# Patient Record
Sex: Male | Born: 1960 | Race: White | Hispanic: No | Marital: Married | Smoking: Current every day smoker
Health system: Southern US, Community
[De-identification: ages and names within clinical notes are randomized; demographics above are authoritative.]

## PROBLEM LIST (undated history)

## (undated) DIAGNOSIS — E119 Type 2 diabetes mellitus without complications: Secondary | ICD-10-CM

## (undated) DIAGNOSIS — F32A Depression, unspecified: Secondary | ICD-10-CM

## (undated) DIAGNOSIS — F17200 Nicotine dependence, unspecified, uncomplicated: Secondary | ICD-10-CM

## (undated) DIAGNOSIS — Z803 Family history of malignant neoplasm of breast: Secondary | ICD-10-CM

## (undated) DIAGNOSIS — Z808 Family history of malignant neoplasm of other organs or systems: Secondary | ICD-10-CM

## (undated) DIAGNOSIS — I10 Essential (primary) hypertension: Secondary | ICD-10-CM

## (undated) DIAGNOSIS — E785 Hyperlipidemia, unspecified: Secondary | ICD-10-CM

## (undated) DIAGNOSIS — N529 Male erectile dysfunction, unspecified: Secondary | ICD-10-CM

## (undated) DIAGNOSIS — F329 Major depressive disorder, single episode, unspecified: Secondary | ICD-10-CM

## (undated) DIAGNOSIS — C801 Malignant (primary) neoplasm, unspecified: Secondary | ICD-10-CM

## (undated) HISTORY — DX: Family history of malignant neoplasm of breast: Z80.3

## (undated) HISTORY — DX: Family history of malignant neoplasm of other organs or systems: Z80.8

---

## 2003-12-18 ENCOUNTER — Encounter: Admission: RE | Admit: 2003-12-18 | Discharge: 2004-03-17 | Payer: Self-pay | Admitting: *Deleted

## 2006-10-12 ENCOUNTER — Emergency Department (HOSPITAL_COMMUNITY): Admission: EM | Admit: 2006-10-12 | Discharge: 2006-10-12 | Payer: Self-pay | Admitting: Emergency Medicine

## 2016-10-08 ENCOUNTER — Other Ambulatory Visit (HOSPITAL_BASED_OUTPATIENT_CLINIC_OR_DEPARTMENT_OTHER): Payer: Self-pay | Admitting: Family Medicine

## 2016-10-08 ENCOUNTER — Ambulatory Visit (HOSPITAL_BASED_OUTPATIENT_CLINIC_OR_DEPARTMENT_OTHER)
Admission: RE | Admit: 2016-10-08 | Discharge: 2016-10-08 | Disposition: A | Discharge status: Home / Self Care | Payer: Managed Care, Other (non HMO) | Source: Ambulatory Visit | Attending: Family Medicine | Admitting: Family Medicine

## 2016-10-08 DIAGNOSIS — M79605 Pain in left leg: Secondary | ICD-10-CM | POA: Diagnosis present

## 2016-10-08 DIAGNOSIS — I82492 Acute embolism and thrombosis of other specified deep vein of left lower extremity: Secondary | ICD-10-CM | POA: Diagnosis not present

## 2016-10-08 DIAGNOSIS — M7989 Other specified soft tissue disorders: Secondary | ICD-10-CM | POA: Insufficient documentation

## 2016-10-08 DIAGNOSIS — I82432 Acute embolism and thrombosis of left popliteal vein: Secondary | ICD-10-CM | POA: Diagnosis not present

## 2016-10-08 DIAGNOSIS — I82412 Acute embolism and thrombosis of left femoral vein: Secondary | ICD-10-CM | POA: Diagnosis not present

## 2017-04-29 ENCOUNTER — Other Ambulatory Visit (HOSPITAL_COMMUNITY): Payer: Self-pay | Admitting: Family Medicine

## 2017-04-29 DIAGNOSIS — Z09 Encounter for follow-up examination after completed treatment for conditions other than malignant neoplasm: Secondary | ICD-10-CM

## 2017-05-07 ENCOUNTER — Ambulatory Visit (HOSPITAL_COMMUNITY)
Admission: RE | Admit: 2017-05-07 | Discharge: 2017-05-07 | Disposition: A | Discharge status: Home / Self Care | Payer: Managed Care, Other (non HMO) | Source: Ambulatory Visit | Attending: Family Medicine | Admitting: Family Medicine

## 2017-05-07 DIAGNOSIS — I82412 Acute embolism and thrombosis of left femoral vein: Secondary | ICD-10-CM | POA: Diagnosis present

## 2017-05-07 DIAGNOSIS — Z09 Encounter for follow-up examination after completed treatment for conditions other than malignant neoplasm: Secondary | ICD-10-CM | POA: Diagnosis not present

## 2017-05-07 NOTE — Progress Notes (Signed)
*  PRELIMINARY RESULTS* Vascular Ultrasound Left lower extremity venous duplex has been completed.  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  Limited visualization of the calf veins but no acute thrombosis noted.  Preliminary results called to Dr. Baldomero Lamy office at 10:15.  Myrtie Cruise Lafaye Mcelmurry 05/07/2017, 10:16 AM

## 2017-10-15 ENCOUNTER — Ambulatory Visit (HOSPITAL_BASED_OUTPATIENT_CLINIC_OR_DEPARTMENT_OTHER)
Admission: RE | Admit: 2017-10-15 | Discharge: 2017-10-15 | Disposition: A | Discharge status: Home / Self Care | Payer: Managed Care, Other (non HMO) | Source: Ambulatory Visit | Attending: Family Medicine | Admitting: Family Medicine

## 2017-10-15 ENCOUNTER — Encounter (HOSPITAL_COMMUNITY): Payer: Self-pay | Admitting: *Deleted

## 2017-10-15 ENCOUNTER — Inpatient Hospital Stay (HOSPITAL_COMMUNITY)
Admission: EM | Admit: 2017-10-15 | Discharge: 2017-10-20 | DRG: 272 | Disposition: A | Discharge status: Home / Self Care | Payer: Managed Care, Other (non HMO) | Attending: Internal Medicine | Admitting: Internal Medicine

## 2017-10-15 ENCOUNTER — Other Ambulatory Visit (HOSPITAL_BASED_OUTPATIENT_CLINIC_OR_DEPARTMENT_OTHER): Payer: Self-pay | Admitting: Family Medicine

## 2017-10-15 ENCOUNTER — Other Ambulatory Visit: Payer: Self-pay

## 2017-10-15 DIAGNOSIS — E1122 Type 2 diabetes mellitus with diabetic chronic kidney disease: Secondary | ICD-10-CM | POA: Diagnosis present

## 2017-10-15 DIAGNOSIS — I82402 Acute embolism and thrombosis of unspecified deep veins of left lower extremity: Secondary | ICD-10-CM

## 2017-10-15 DIAGNOSIS — D696 Thrombocytopenia, unspecified: Secondary | ICD-10-CM

## 2017-10-15 DIAGNOSIS — I10 Essential (primary) hypertension: Secondary | ICD-10-CM | POA: Diagnosis not present

## 2017-10-15 DIAGNOSIS — N183 Chronic kidney disease, stage 3 unspecified: Secondary | ICD-10-CM | POA: Diagnosis present

## 2017-10-15 DIAGNOSIS — I82412 Acute embolism and thrombosis of left femoral vein: Principal | ICD-10-CM | POA: Diagnosis present

## 2017-10-15 DIAGNOSIS — M7989 Other specified soft tissue disorders: Secondary | ICD-10-CM | POA: Insufficient documentation

## 2017-10-15 DIAGNOSIS — R609 Edema, unspecified: Secondary | ICD-10-CM

## 2017-10-15 DIAGNOSIS — Z7984 Long term (current) use of oral hypoglycemic drugs: Secondary | ICD-10-CM

## 2017-10-15 DIAGNOSIS — D649 Anemia, unspecified: Secondary | ICD-10-CM | POA: Diagnosis present

## 2017-10-15 DIAGNOSIS — F329 Major depressive disorder, single episode, unspecified: Secondary | ICD-10-CM | POA: Diagnosis present

## 2017-10-15 DIAGNOSIS — I129 Hypertensive chronic kidney disease with stage 1 through stage 4 chronic kidney disease, or unspecified chronic kidney disease: Secondary | ICD-10-CM | POA: Diagnosis present

## 2017-10-15 DIAGNOSIS — I8002 Phlebitis and thrombophlebitis of superficial vessels of left lower extremity: Secondary | ICD-10-CM | POA: Insufficient documentation

## 2017-10-15 DIAGNOSIS — N289 Disorder of kidney and ureter, unspecified: Secondary | ICD-10-CM

## 2017-10-15 DIAGNOSIS — E119 Type 2 diabetes mellitus without complications: Secondary | ICD-10-CM

## 2017-10-15 DIAGNOSIS — E785 Hyperlipidemia, unspecified: Secondary | ICD-10-CM | POA: Diagnosis present

## 2017-10-15 DIAGNOSIS — F17210 Nicotine dependence, cigarettes, uncomplicated: Secondary | ICD-10-CM | POA: Diagnosis present

## 2017-10-15 HISTORY — DX: Depression, unspecified: F32.A

## 2017-10-15 HISTORY — DX: Nicotine dependence, unspecified, uncomplicated: F17.200

## 2017-10-15 HISTORY — DX: Male erectile dysfunction, unspecified: N52.9

## 2017-10-15 HISTORY — DX: Major depressive disorder, single episode, unspecified: F32.9

## 2017-10-15 HISTORY — DX: Type 2 diabetes mellitus without complications: E11.9

## 2017-10-15 HISTORY — DX: Hyperlipidemia, unspecified: E78.5

## 2017-10-15 LAB — BASIC METABOLIC PANEL
Anion gap: 10 (ref 5–15)
BUN: 18 mg/dL (ref 6–20)
CO2: 22 mmol/L (ref 22–32)
Calcium: 9.8 mg/dL (ref 8.9–10.3)
Chloride: 100 mmol/L — ABNORMAL LOW (ref 101–111)
Creatinine, Ser: 1.51 mg/dL — ABNORMAL HIGH (ref 0.61–1.24)
GFR calc Af Amer: 57 mL/min — ABNORMAL LOW (ref >60)
GFR calc non Af Amer: 50 mL/min — ABNORMAL LOW (ref >60)
Glucose, Bld: 127 mg/dL — ABNORMAL HIGH (ref 65–99)
Potassium: 4.8 mmol/L (ref 3.5–5.1)
Sodium: 132 mmol/L — ABNORMAL LOW (ref 135–145)

## 2017-10-15 LAB — CBC WITH DIFFERENTIAL/PLATELET
Basophils Absolute: 0 10*3/uL (ref 0.0–0.1)
Basophils Relative: 0 %
Eosinophils Absolute: 0.4 10*3/uL (ref 0.0–0.7)
Eosinophils Relative: 4 %
HCT: 40.9 % (ref 39.0–52.0)
Hemoglobin: 14.1 g/dL (ref 13.0–17.0)
Lymphocytes Relative: 24 %
Lymphs Abs: 2.2 10*3/uL (ref 0.7–4.0)
MCH: 30.7 pg (ref 26.0–34.0)
MCHC: 34.5 g/dL (ref 30.0–36.0)
MCV: 88.9 fL (ref 78.0–100.0)
Monocytes Absolute: 0.8 10*3/uL (ref 0.1–1.0)
Monocytes Relative: 9 %
Neutro Abs: 5.7 10*3/uL (ref 1.7–7.7)
Neutrophils Relative %: 63 %
Platelets: 187 10*3/uL (ref 150–400)
RBC: 4.6 MIL/uL (ref 4.22–5.81)
RDW: 13.3 % (ref 11.5–15.5)
WBC: 9.1 10*3/uL (ref 4.0–10.5)

## 2017-10-15 MED ORDER — HEPARIN (PORCINE) IN NACL 100-0.45 UNIT/ML-% IJ SOLN
1700.0000 [IU]/h | INTRAMUSCULAR | Status: DC
  Administered 2017-10-15 – 2017-10-18 (×5): 1700 [IU]/h via INTRAVENOUS
  Filled 2017-10-15 (×8): qty 250

## 2017-10-15 MED ORDER — ACETAMINOPHEN 650 MG RE SUPP: 650.0000 mg | Freq: Four times a day (QID) | RECTAL | Status: DC | PRN

## 2017-10-15 MED ORDER — ACETAMINOPHEN 325 MG PO TABS
650.0000 mg | ORAL_TABLET | Freq: Four times a day (QID) | ORAL | Status: DC | PRN
  Administered 2017-10-18 – 2017-10-19 (×2): 650 mg via ORAL
  Filled 2017-10-15 (×2): qty 2

## 2017-10-15 MED ORDER — ONDANSETRON HCL 4 MG/2ML IJ SOLN: 4.0000 mg | Freq: Four times a day (QID) | INTRAMUSCULAR | Status: DC | PRN

## 2017-10-15 MED ORDER — GLIMEPIRIDE 4 MG PO TABS
4.0000 mg | ORAL_TABLET | Freq: Every day | ORAL | Status: DC
  Administered 2017-10-16 – 2017-10-20 (×4): 4 mg via ORAL
  Filled 2017-10-15 (×6): qty 1

## 2017-10-15 MED ORDER — INSULIN ASPART 100 UNIT/ML ~~LOC~~ SOLN: 0.0000 [IU] | Freq: Three times a day (TID) | SUBCUTANEOUS | Status: DC

## 2017-10-15 MED ORDER — FENOFIBRATE 54 MG PO TABS
54.0000 mg | ORAL_TABLET | Freq: Every day | ORAL | Status: DC
  Administered 2017-10-16: 54 mg via ORAL
  Filled 2017-10-15: qty 1

## 2017-10-15 MED ORDER — PIOGLITAZONE HCL 45 MG PO TABS
45.0000 mg | ORAL_TABLET | Freq: Every day | ORAL | Status: DC
  Administered 2017-10-16: 45 mg via ORAL
  Filled 2017-10-15: qty 1

## 2017-10-15 MED ORDER — METFORMIN HCL 500 MG PO TABS
1000.0000 mg | ORAL_TABLET | Freq: Two times a day (BID) | ORAL | Status: DC
  Administered 2017-10-16: 1000 mg via ORAL
  Filled 2017-10-15: qty 2

## 2017-10-15 MED ORDER — ONDANSETRON HCL 4 MG PO TABS: 4.0000 mg | ORAL_TABLET | Freq: Four times a day (QID) | ORAL | Status: DC | PRN

## 2017-10-15 MED ORDER — HEPARIN BOLUS VIA INFUSION
6000.0000 [IU] | Freq: Once | INTRAVENOUS | Status: AC
  Administered 2017-10-15: 6000 [IU] via INTRAVENOUS
  Filled 2017-10-15: qty 6000

## 2017-10-15 MED ORDER — LISINOPRIL 10 MG PO TABS
10.0000 mg | ORAL_TABLET | Freq: Every day | ORAL | Status: DC
  Administered 2017-10-16: 10 mg via ORAL
  Filled 2017-10-15: qty 1

## 2017-10-15 NOTE — Consult Note (Signed)
[facility] [place]  Patient name: Brian Torres MRN: [id number] DOB: 12-04-1960 Sex: male  REASON FOR CONSULT: Left leg DVT  HPI: Brian Torres is a 56 y.o. male, who is admitted for left leg DVT.  He has a prior history of DVT approximately 1 year ago.  He was treated with 7 months of Xarelto and then discontinued.  He presents today with a new episode of significant left leg swelling in his entire leg.  He reports that prior to today he would have some swelling in his knee distally with prolonged standing but this is dramatically different.  He underwent outpatient duplex showing occlusion of his right common femoral, femoral and popliteal veins.  He is admitted for IV antibiotics and planned lysis.  He does not have any history of hypercoagulability.  He denies of clotting disorders.  He does not recall any trauma around the time of his first DVT or this event.  Past Medical History:  Diagnosis Date  . Depression   . Diabetes mellitus without complication ([place])   . Dyslipidemia   . Erectile dysfunction   . Nicotine addiction     No family history on file.  SOCIAL HISTORY: Social History   Socioeconomic History  . Marital status: Married    Spouse name: Not on file  . Number of children: Not on file  . Years of education: Not on file  . Highest education level: Not on file  Social Needs  . Financial resource strain: Not on file  . Food insecurity - worry: Not on file  . Food insecurity - inability: Not on file  . Transportation needs - medical: Not on file  . Transportation needs - non-medical: Not on file  Occupational History  . Not on file  Tobacco Use  . Smoking status: Current Every Day Smoker    Packs/day: 0.50    Types: Cigarettes  . Smokeless tobacco: Never Used  Substance and Sexual Activity  . Alcohol use: No    Frequency: Never  . Drug use: No  . Sexual activity: Not on file  Other Topics  Concern  . Not on file  Social History Narrative  . Not on file    No Known Allergies  Current Facility-Administered Medications  Medication Dose Route Frequency Provider Last Rate Last Dose  . acetaminophen (TYLENOL) tablet 650 mg  650 mg Oral Q6H PRN Etta Quill, DO       Or  . acetaminophen (TYLENOL) suppository 650 mg  650 mg Rectal Q6H PRN Etta Quill, DO      . [START ON 10/16/2017] fenofibrate tablet 54 mg  54 mg Oral Daily Jennette Kettle M, DO      . [START ON 10/16/2017] glimepiride (AMARYL) tablet 4 mg  4 mg Oral Q breakfast Jennette Kettle M, DO      . heparin ADULT infusion 100 units/mL (25000 units/239mL sodium chloride 0.45%)  1,700 Units/hr Intravenous Continuous Reginia Naas, RPH 17 mL/hr at 10/15/17 1932 1,700 Units/hr at 10/15/17 1932  . [START ON 10/16/2017] lisinopril (PRINIVIL,ZESTRIL) tablet 10 mg  10 mg Oral Daily Etta Quill, DO      . [START ON 10/16/2017] metFORMIN (GLUCOPHAGE) tablet 1,000 mg  1,000 mg Oral BID WC Etta Quill, DO      . ondansetron [facility]) tablet 4 mg  4 mg Oral Q6H PRN Etta Quill, DO       Or  . ondansetron [facility]) injection  4 mg  4 mg Intravenous Q6H PRN Etta Quill, DO      . [START ON 10/16/2017] pioglitazone (ACTOS) tablet 45 mg  45 mg Oral Daily Etta Quill, DO        REVIEW OF SYSTEMS:  Reviewed in his history and physical with nothing to add PHYSICAL EXAM: Vitals:   10/15/17 1930 10/15/17 1945 10/15/17 2030 10/15/17 2045  BP: 114/74 116/81 103/77 114/71  Pulse: 84 94 95 96  Resp: (!) 24 14 (!) 26   Temp:      TempSrc:      SpO2: 100% 100% 97% 98%  Weight:      Height:        GENERAL: The patient is a well-nourished male, in no acute distress. The vital signs are documented above. CARDIOVASCULAR: 2+ dorsalis pedis pulses bilaterally.  Marked swelling and some erythema throughout his entire left leg with no swelling on the right PULMONARY: There is good air exchange  ABDOMEN: Soft  and non-tender  MUSCULOSKELETAL: There are no major deformities or cyanosis. NEUROLOGIC: No focal weakness or paresthesias are detected. SKIN: There are no ulcers or rashes noted.  But he does have changes consistent with venous hypertension in his left leg with telangiectasia around his distal calf and extending onto his foot pSYCHIATRIC: The patient has a normal affect.  DATA:  Left leg femoral and distal DVT  MEDICAL ISSUES: Discussed at length with the patient and his wife and also Dr. Alcario Drought.  Due to the significance of his swelling and his recurrent thrombosis in the proximal nature of the disease venography and possible lysis.  The patient is being admitted for IV heparin and will plan procedure on Monday, 10/18/2017.  He does have mild renal insufficiency   Rosetta Posner, MD FACS [facility] Tel [phone] Pager [phone]

## 2017-10-15 NOTE — Progress Notes (Signed)
Patient arrived to unit from ED. Alert and oriented x4. Wife at bedside. Report received from [place], [place].

## 2017-10-15 NOTE — Progress Notes (Signed)
ANTICOAGULATION CONSULT NOTE - Initial Consult  Pharmacy Consult for heparin Indication: DVT  No Known Allergies  Patient Measurements: TBW 102 kg IBW 86.8 kg Heparin Dosing Weight: 102 kg  Assessment: 55 yo M presents on 12/14 from [place] due to confirmed DVT in L leg. Pharmacy consulted to start heparin. No anticoag PTA.   Goal of Therapy:  Heparin level 0.3-0.7 units/ml Monitor platelets by anticoagulation protocol: Yes   Plan:  Give heparin 6,000 unit bolus Start heparin gtt at 1,700 units/hr Monitor daily heparin level, CBC, s/s of bleed  Elenor Quinones, PharmD, [facility] Clinical Pharmacist Pager [phone] 10/15/2017 6:43 PM

## 2017-10-15 NOTE — ED Provider Notes (Signed)
[facility] EMERGENCY DEPARTMENT Provider Note   CSN: [id number] Arrival date & time: 10/15/17  1711     History   Chief Complaint Chief Complaint  Patient presents with  . Leg Pain    HPI Brian Torres is a 56 y.o. male.  Patient noticed swelling of the left leg, today, so went to his PCP.  He was ultimately diagnosed with a left leg DVT, large.  Vascular surgery, Dr. Donzetta Matters, was contacted who suggested that he come to the ED, be started on heparin, and admitted.  The patient denies fever, chills, shortness of breath, chest pain, weakness or dizziness.  Last week he had a cough and cold symptoms with a low-grade temperature.  He continues to have some postnasal drip and occasional cough.  He has a history of DVT about 1 year ago which was treated with warfarin for 6 months.  He states he has chronic swelling of the left lower leg and ankle, since the DVT.  There are no other known modifying factors.  HPI  Past Medical History:  Diagnosis Date  . Depression   . Diabetes mellitus without complication ([place])   . Dyslipidemia   . Erectile dysfunction   . Nicotine addiction     There are no active problems to display for this patient.   History reviewed. No pertinent surgical history.     Home Medications    Prior to Admission medications   Medication Sig Start Date End Date Taking? Authorizing Provider  b complex vitamins tablet Take 1 tablet by mouth daily.   Yes [provider]  CINNAMON PO Take 1,000 mg by mouth 2 (two) times daily.   Yes [provider]  fenofibrate (TRICOR) 48 MG tablet Take 48 mg by mouth daily. 10/13/17  Yes [provider]  glimepiride (AMARYL) 4 MG tablet Take 4 mg by mouth daily. 10/13/17  Yes [provider]  Javier Docker Oil 350 MG CAPS Take 350 mg by mouth 2 (two) times daily.   Yes [provider]  lisinopril (PRINIVIL,ZESTRIL) 10 MG tablet Take 10 mg by mouth daily. 10/13/17  Yes  [provider]  metFORMIN (GLUCOPHAGE) 1000 MG tablet Take 1,000 mg by mouth 2 (two) times daily. 10/13/17  Yes [provider]  pioglitazone (ACTOS) 45 MG tablet Take 45 mg by mouth daily. 10/13/17  Yes [provider]    Family History No family history on file.  Social History Social History   Tobacco Use  . Smoking status: Current Every Day Smoker    Packs/day: 0.50    Types: Cigarettes  . Smokeless tobacco: Never Used  Substance Use Topics  . Alcohol use: No    Frequency: Never  . Drug use: No     Allergies   Patient has no known allergies.   Review of Systems Review of Systems  All other systems reviewed and are negative.    Physical Exam Updated Vital Signs BP 114/74   Pulse 84   Temp 97.8 F (36.6 C) (Oral)   Resp (!) 24   Ht 6\' 4"  (1.93 m)   Wt 102.1 kg (225 lb)   SpO2 100%   BMI 27.39 kg/m   Physical Exam  Constitutional: He is oriented to person, place, and time. He appears well-developed and well-nourished. No distress.  HENT:  Head: Normocephalic and atraumatic.  Right Ear: External ear normal.  Left Ear: External ear normal.  Eyes: Conjunctivae and EOM are normal. Pupils are equal, round,  and reactive to light.  Neck: Normal range of motion and phonation normal. Neck supple.  Cardiovascular: Normal rate, regular rhythm and normal heart sounds.  Pulmonary/Chest: Effort normal and breath sounds normal. He exhibits no bony tenderness.  Abdominal: Soft. He exhibits no distension. There is no tenderness.  Musculoskeletal: Normal range of motion.  Left leg swollen from the groin to the toes.  Left leg is nontender to palpation.  No overlying skin changes.  Normal range of motion of arms and legs bilaterally.  Neurological: He is alert and oriented to person, place, and time. No cranial nerve deficit or sensory deficit. He exhibits normal muscle tone. Coordination normal.  Skin: Skin is warm, dry and intact.  Psychiatric:  He has a normal mood and affect. His behavior is normal. Judgment and thought content normal.  Nursing note and vitals reviewed.    ED Treatments / Results  Labs (all labs ordered are listed, but only abnormal results are displayed) Labs Reviewed  BASIC METABOLIC PANEL - Abnormal; Notable for the following components:      Result Value   Sodium 132 (*)    Chloride 100 (*)    Glucose, Bld 127 (*)    Creatinine, Ser 1.51 (*)    GFR calc non Af Amer 50 (*)    GFR calc Af Amer 57 (*)    All other components within normal limits  CBC WITH DIFFERENTIAL/PLATELET  CBC  HEPARIN LEVEL (UNFRACTIONATED)    \ BUN  Date Value Ref Range Status  10/15/2017 18 6 - 20 mg/dL Final   Creatinine, Ser  Date Value Ref Range Status  10/15/2017 1.51 (H) 0.61 - 1.24 mg/dL Final     EKG  EKG Interpretation None       Radiology US Venous Img Lower Unilateral Left  Result Date: 10/15/2017 CLINICAL DATA:  Left lower extremity swelling EXAM: LEFT LOWER EXTREMITY VENOUS DOPPLER ULTRASOUND TECHNIQUE: Gray-scale sonography with graded compression, as well as color Doppler and duplex ultrasound were performed to evaluate the lower extremity deep venous systems from the level of the common femoral vein and including the common femoral, femoral, profunda femoral, popliteal and calf veins including the posterior tibial, peroneal and gastrocnemius veins when visible. The superficial great saphenous vein was also interrogated. Spectral Doppler was utilized to evaluate flow at rest and with distal augmentation maneuvers in the common femoral, femoral and popliteal veins. COMPARISON:  None. FINDINGS: Contralateral Common Femoral Vein: Respiratory phasicity is normal and symmetric with the symptomatic side. No evidence of thrombus. Normal compressibility. Common Femoral Vein: Occlusive acute thrombus noted within the common femoral vein with expansion of the vein. Saphenofemoral Junction: Occlusive thrombus noted  in the saphenous femoral junction. Profunda Femoral Vein: Occlusive, acute thrombus noted. Femoral Vein: Occlusive acute thrombus noted. Popliteal Vein: Occlusive acute thrombus noted. Calf Veins: Occlusive acute thrombus noted. Superficial Great Saphenous Vein: Occlusive thrombus noted in the great saphenous vein in thigh and calf and short saphenous vein within the calf. Venous Reflux:  N/A Other Findings:  None. IMPRESSION: Extensive acute thrombus throughout the left lower extremity from the common femoral vein through the calf veins. Superficial thrombophlebitis within the greater saphenous and short saphenous veins in the left leg. Critical Value/emergent results were called by telephone at the time of interpretation on 10/15/2017 at 3:51 pm to Dr. Abigail Butts MCNEILL , who verbally acknowledged these results. Electronically Signed   By: Rolm Baptise M.D.   On: 10/15/2017 15:53    Procedures Procedures (including critical care  time)  Medications Ordered in ED Medications  heparin ADULT infusion 100 units/mL (25000 units/258mL sodium chloride 0.45%) (1,700 Units/hr Intravenous New Bag/Given 10/15/17 1932)  heparin bolus via infusion 6,000 Units (6,000 Units Intravenous Bolus from Bag 10/15/17 1930)     Initial Impression / Assessment and Plan / ED Course  I have reviewed the triage vital signs and the nursing notes.  Pertinent labs & imaging results that were available during my care of the patient were reviewed by me and considered in my medical decision making (see chart for details).  Clinical Course as of Oct 15 1937  Fri Oct 15, 2017  8527 Case discussed with vascular surgery, Dr. Donnetta Hutching, he will evaluate the patient in the ED.  He recommends initiation of heparin, full dose, and expects to do a lysis procedure, in 3 days.  [EW]    Clinical Course User Index [EW] Daleen Bo, MD    Patient Vitals for the past 24 hrs:  BP Temp Temp src Pulse Resp SpO2 Height Weight  10/15/17 1930  114/74 - - 84 (!) 24 100 % - -  10/15/17 1915 118/86 - - 88 (!) 25 100 % - -  10/15/17 1900 114/79 - - 87 13 100 % - -  10/15/17 1845 127/84 - - 89 - 99 % 6\' 4"  (1.93 m) 102.1 kg (225 lb)  10/15/17 1830 120/76 - - 85 13 100 % - -  10/15/17 1800 125/80 97.8 F (36.6 C) Oral 89 17 100 % - -  10/15/17 1733 120/86 98.3 F (36.8 C) Oral (!) 102 16 100 % - -    7:47 PM Reevaluation with update and discussion. After initial assessment and treatment, an updated evaluation reveals he remains comfortable has no further complaints.  He has been told in the past that his creatinine level was somewhat elevated, but he does not know the value.  Updated finding and plans discussed with patient, all questions answered. Daleen Bo   7:48 PM-Consult complete with Hospitalist. Patient case explained and discussed. He agrees to admit patient for further evaluation and treatment. Call ended at 1905   Final Clinical Impressions(s) / ED Diagnoses   Final diagnoses:  Acute deep vein thrombosis (DVT) of femoral vein of left lower extremity (HCC)  Renal insufficiency    Recurrent DVT left leg, large clot burden, requiring hospitalization, heparinization, and clot lysis.  Patient to be admitted to hospitalist service, with vascular surgery lysis planned for 10/18/17.  Doubt PE, ACS or impending vascular collapse.  Nursing Notes Reviewed/ Care Coordinated Applicable Imaging Reviewed Interpretation of Laboratory Data incorporated into ED treatment  Plan: [place]  ED Discharge Orders    None       Daleen Bo, MD 10/16/17 1031

## 2017-10-15 NOTE — ED Triage Notes (Signed)
Pt sent from [facility] physicians with note that pt had confirmed DVT occlusive clot through L leg extending into groin, pt sent for admit for heparin tx

## 2017-10-15 NOTE — ED Notes (Signed)
Delay in lab draw,  Admitting MD at bedside. 

## 2017-10-15 NOTE — H&P (Signed)
History and Physical    Brian Torres:[id number] DOB: 08/07/1961 DOA: 10/15/2017  PCP: Cari Caraway, MD  Patient coming from: Home  I have personally briefly reviewed patient's old medical records in [place]  Chief Complaint: LLE DVT  HPI: Brian Torres is a 56 y.o. male with medical history significant of prior LLE DVT earlier this year was on 7 months of Xarelto, DM2, HTN, CKD stage 3.  Patient presents to the ED with L leg pain and swelling.  Onset earlier today.  Went to PCP.  Found large occlusive L leg DVT.  Vasc surg Dr. Donzetta Matters was contacted and had patient sent to ED for heparin gtt, admission, and lysis on Monday.  Creat 1.5 today.   Review of Systems: As per HPI otherwise 10 point review of systems negative.   Past Medical History:  Diagnosis Date  . Depression   . Diabetes mellitus without complication ([place])   . Dyslipidemia   . Erectile dysfunction   . Nicotine addiction     History reviewed. No pertinent surgical history.   reports that he has been smoking cigarettes.  He has been smoking about 0.50 packs per day. he has never used smokeless tobacco. He reports that he does not drink alcohol or use drugs.  No Known Allergies  No family history on file. No FHx of clotting disorder  Prior to Admission medications   Medication Sig Start Date End Date Taking? Authorizing Provider  b complex vitamins tablet Take 1 tablet by mouth daily.   Yes [provider]  CINNAMON PO Take 1,000 mg by mouth 2 (two) times daily.   Yes [provider]  fenofibrate (TRICOR) 48 MG tablet Take 48 mg by mouth daily. 10/13/17  Yes [provider]  glimepiride (AMARYL) 4 MG tablet Take 4 mg by mouth daily. 10/13/17  Yes [provider]  Javier Docker Oil 350 MG CAPS Take 350 mg by mouth 2 (two) times daily.   Yes [provider]  lisinopril (PRINIVIL,ZESTRIL) 10 MG tablet Take 10 mg by mouth daily. 10/13/17  Yes [provider]  metFORMIN (GLUCOPHAGE) 1000 MG tablet Take 1,000 mg by mouth 2 (two) times daily. 10/13/17  Yes [provider]  pioglitazone (ACTOS) 45 MG tablet Take 45 mg by mouth daily. 10/13/17  Yes [provider]    Physical Exam: Vitals:   10/15/17 1915 10/15/17 1930 10/15/17 1945 10/15/17 2030  BP: 118/86 114/74 116/81 103/77  Pulse: 88 84 94 95  Resp: (!) 25 (!) 24 14 (!) 26  Temp:      TempSrc:      SpO2: 100% 100% 100% 97%  Weight:      Height:        Constitutional: NAD, calm, comfortable Eyes: PERRL, lids and conjunctivae normal ENMT: Mucous membranes are moist. Posterior pharynx clear of any exudate or lesions.Normal dentition.  Neck: normal, supple, no masses, no thyromegaly Respiratory: clear to auscultation bilaterally, no wheezing, no crackles. Normal respiratory effort. No accessory muscle use.  Cardiovascular: Regular rate and rhythm, no murmurs / rubs / gallops. No extremity edema. 2+ pedal pulses. No carotid bruits.  Abdomen: no tenderness, no masses palpated. No hepatosplenomegaly. Bowel sounds positive.  Musculoskeletal: no clubbing / cyanosis. No joint deformity upper and lower extremities. Good ROM, no contractures. Normal muscle tone.  Skin: no rashes, lesions, ulcers. No induration Neurologic: CN 2-12 grossly intact. Sensation intact, DTR normal. Strength 5/5 in all 4.  Psychiatric: Normal judgment and  insight. Alert and oriented x 3. Normal mood.    Labs on Admission: I have personally reviewed following labs and imaging studies  CBC: Recent Labs  Lab 10/15/17 1813  WBC 9.1  NEUTROABS 5.7  HGB 14.1  HCT 40.9  MCV 88.9  PLT 629   Basic Metabolic Panel: Recent Labs  Lab 10/15/17 1813  NA 132*  K 4.8  CL 100*  CO2 22  GLUCOSE 127*  BUN 18  CREATININE 1.51*  CALCIUM 9.8   GFR: Estimated Creatinine Clearance: 66.3 mL/min (A) (by C-G formula based on SCr of 1.51 mg/dL (H)). Liver Function Tests: No results for input(s): AST,  ALT, ALKPHOS, BILITOT, PROT, ALBUMIN in the last 168 hours. No results for input(s): LIPASE, AMYLASE in the last 168 hours. No results for input(s): AMMONIA in the last 168 hours. Coagulation Profile: No results for input(s): INR, PROTIME in the last 168 hours. Cardiac Enzymes: No results for input(s): CKTOTAL, CKMB, CKMBINDEX, TROPONINI in the last 168 hours. BNP (last 3 results) No results for input(s): PROBNP in the last 8760 hours. HbA1C: No results for input(s): HGBA1C in the last 72 hours. CBG: No results for input(s): GLUCAP in the last 168 hours. Lipid Profile: No results for input(s): CHOL, HDL, LDLCALC, TRIG, CHOLHDL, LDLDIRECT in the last 72 hours. Thyroid Function Tests: No results for input(s): TSH, T4TOTAL, FREET4, T3FREE, THYROIDAB in the last 72 hours. Anemia Panel: No results for input(s): VITAMINB12, FOLATE, FERRITIN, TIBC, IRON, RETICCTPCT in the last 72 hours. Urine analysis: No results found for: COLORURINE, APPEARANCEUR, LABSPEC, [place], GLUCOSEU, HGBUR, BILIRUBINUR, [place], [place], UROBILINOGEN, NITRITE, LEUKOCYTESUR  Radiological Exams on Admission: US Venous Img Lower Unilateral Left  Result Date: 10/15/2017 CLINICAL DATA:  Left lower extremity swelling EXAM: LEFT LOWER EXTREMITY VENOUS DOPPLER ULTRASOUND TECHNIQUE: Gray-scale sonography with graded compression, as well as color Doppler and duplex ultrasound were performed to evaluate the lower extremity deep venous systems from the level of the common femoral vein and including the common femoral, femoral, profunda femoral, popliteal and calf veins including the posterior tibial, peroneal and gastrocnemius veins when visible. The superficial great saphenous vein was also interrogated. Spectral Doppler was utilized to evaluate flow at rest and with distal augmentation maneuvers in the common femoral, femoral and popliteal veins. COMPARISON:  None. FINDINGS: Contralateral Common Femoral Vein: Respiratory  phasicity is normal and symmetric with the symptomatic side. No evidence of thrombus. Normal compressibility. Common Femoral Vein: Occlusive acute thrombus noted within the common femoral vein with expansion of the vein. Saphenofemoral Junction: Occlusive thrombus noted in the saphenous femoral junction. Profunda Femoral Vein: Occlusive, acute thrombus noted. Femoral Vein: Occlusive acute thrombus noted. Popliteal Vein: Occlusive acute thrombus noted. Calf Veins: Occlusive acute thrombus noted. Superficial Great Saphenous Vein: Occlusive thrombus noted in the great saphenous vein in thigh and calf and short saphenous vein within the calf. Venous Reflux:  N/A Other Findings:  None. IMPRESSION: Extensive acute thrombus throughout the left lower extremity from the common femoral vein through the calf veins. Superficial thrombophlebitis within the greater saphenous and short saphenous veins in the left leg. Critical Value/emergent results were called by telephone at the time of interpretation on 10/15/2017 at 3:51 pm to Dr. Abigail Butts MCNEILL , who verbally acknowledged these results. Electronically Signed   By: Rolm Baptise M.D.   On: 10/15/2017 15:53    EKG: Independently reviewed.  Assessment/Plan Principal Problem:   Leg DVT (deep venous thromboembolism), acute, left (HCC) Active Problems:   DM2 (diabetes mellitus, type 2) ([place])  HTN (hypertension)   CKD stage 3 due to type 2 diabetes mellitus ([place])    1. LLE DVT - 1. Heparin gtt 2. Dr. Donnetta Hutching at bedside 3. Current plan is for lysis procedure on Monday 4. Letting patient eat over weekend 2. CKD stage 3 - 1. Patient is aware of known chronic slight elevation in creatinine that PCP is watching 2. Suspect that 1.5 today is likely his baseline 3. DM2 - 1. Continue home meds for now 2. Will need to stop these and switch to SSI on Monday 3. And hold metformin for 48 hours after Monday's contrast procedure 4. HTN - continue lisinopril  DVT  prophylaxis: Heparin gtt Code Status: Full Family Communication: Wife at bedside Disposition Plan: Home after admit Consults called: Dr. Donnetta Hutching seeing patient now Admission status: Place in obs - hopefully will qualify as inpatient on review in AM given that we are admitting for procedure on Monday due to extent of DVT.   Etta Quill DO [facility] Pager [phone]  If 7AM-7PM, please contact day team taking care of patient [email or web address] Password [facility]  10/15/2017, 8:33 PM

## 2017-10-16 DIAGNOSIS — D696 Thrombocytopenia, unspecified: Secondary | ICD-10-CM | POA: Diagnosis present

## 2017-10-16 DIAGNOSIS — I82402 Acute embolism and thrombosis of unspecified deep veins of left lower extremity: Secondary | ICD-10-CM | POA: Diagnosis not present

## 2017-10-16 DIAGNOSIS — E1122 Type 2 diabetes mellitus with diabetic chronic kidney disease: Secondary | ICD-10-CM | POA: Diagnosis not present

## 2017-10-16 DIAGNOSIS — N289 Disorder of kidney and ureter, unspecified: Secondary | ICD-10-CM | POA: Diagnosis not present

## 2017-10-16 DIAGNOSIS — F329 Major depressive disorder, single episode, unspecified: Secondary | ICD-10-CM | POA: Diagnosis present

## 2017-10-16 DIAGNOSIS — I82412 Acute embolism and thrombosis of left femoral vein: Secondary | ICD-10-CM | POA: Diagnosis present

## 2017-10-16 DIAGNOSIS — D649 Anemia, unspecified: Secondary | ICD-10-CM | POA: Diagnosis present

## 2017-10-16 DIAGNOSIS — E785 Hyperlipidemia, unspecified: Secondary | ICD-10-CM | POA: Diagnosis present

## 2017-10-16 DIAGNOSIS — N183 Chronic kidney disease, stage 3 (moderate): Secondary | ICD-10-CM | POA: Diagnosis not present

## 2017-10-16 DIAGNOSIS — Z7984 Long term (current) use of oral hypoglycemic drugs: Secondary | ICD-10-CM | POA: Diagnosis not present

## 2017-10-16 DIAGNOSIS — F17210 Nicotine dependence, cigarettes, uncomplicated: Secondary | ICD-10-CM | POA: Diagnosis present

## 2017-10-16 DIAGNOSIS — I129 Hypertensive chronic kidney disease with stage 1 through stage 4 chronic kidney disease, or unspecified chronic kidney disease: Secondary | ICD-10-CM | POA: Diagnosis present

## 2017-10-16 DIAGNOSIS — I10 Essential (primary) hypertension: Secondary | ICD-10-CM | POA: Diagnosis not present

## 2017-10-16 LAB — HEPARIN LEVEL (UNFRACTIONATED)
HEPARIN UNFRACTIONATED: 0.56 [IU]/mL (ref 0.30–0.70)
HEPARIN UNFRACTIONATED: 0.48 [IU]/mL (ref 0.30–0.70)

## 2017-10-16 LAB — HIV ANTIBODY (ROUTINE TESTING W REFLEX): HIV Screen 4th Generation wRfx: NONREACTIVE

## 2017-10-16 LAB — BASIC METABOLIC PANEL
Anion gap: 7 (ref 5–15)
BUN: 21 mg/dL — AB (ref 6–20)
CO2: 23 mmol/L (ref 22–32)
CREATININE: 1.54 mg/dL — AB (ref 0.61–1.24)
Calcium: 8.9 mg/dL (ref 8.9–10.3)
Chloride: 102 mmol/L (ref 101–111)
GFR calc Af Amer: 56 mL/min — ABNORMAL LOW (ref >60)
GFR, EST NON AFRICAN AMERICAN: 48 mL/min — AB (ref >60)
Glucose, Bld: 205 mg/dL — ABNORMAL HIGH (ref 65–99)
Potassium: 5.2 mmol/L — ABNORMAL HIGH (ref 3.5–5.1)
SODIUM: 132 mmol/L — AB (ref 135–145)

## 2017-10-16 LAB — CBC
HCT: 36.2 % — ABNORMAL LOW (ref 39.0–52.0)
HEMOGLOBIN: 12.4 g/dL — AB (ref 13.0–17.0)
MCH: 30.7 pg (ref 26.0–34.0)
MCHC: 34.3 g/dL (ref 30.0–36.0)
MCV: 89.6 fL (ref 78.0–100.0)
PLATELETS: 168 10*3/uL (ref 150–400)
RBC: 4.04 MIL/uL — AB (ref 4.22–5.81)
RDW: 13.6 % (ref 11.5–15.5)
WBC: 6.8 10*3/uL (ref 4.0–10.5)

## 2017-10-16 LAB — GLUCOSE, CAPILLARY
GLUCOSE-CAPILLARY: 214 mg/dL — AB (ref 65–99)
Glucose-Capillary: 210 mg/dL — ABNORMAL HIGH (ref 65–99)

## 2017-10-16 MED ORDER — B COMPLEX-C PO TABS
1.0000 | ORAL_TABLET | ORAL | Status: DC
  Administered 2017-10-17 – 2017-10-20 (×4): 1 via ORAL
  Filled 2017-10-16 (×4): qty 1

## 2017-10-16 MED ORDER — LISINOPRIL 10 MG PO TABS: 10.0000 mg | ORAL_TABLET | ORAL | Status: DC

## 2017-10-16 MED ORDER — ADULT MULTIVITAMIN W/MINERALS CH
1.0000 | ORAL_TABLET | ORAL | Status: DC
  Administered 2017-10-17 – 2017-10-20 (×4): 1 via ORAL
  Filled 2017-10-16 (×4): qty 1

## 2017-10-16 MED ORDER — PIOGLITAZONE HCL 45 MG PO TABS
45.0000 mg | ORAL_TABLET | Freq: Every day | ORAL | Status: DC
  Administered 2017-10-17 – 2017-10-20 (×3): 45 mg via ORAL
  Filled 2017-10-16 (×4): qty 1

## 2017-10-16 MED ORDER — FENOFIBRATE 54 MG PO TABS
54.0000 mg | ORAL_TABLET | ORAL | Status: DC
  Administered 2017-10-17 – 2017-10-20 (×4): 54 mg via ORAL
  Filled 2017-10-16 (×4): qty 1

## 2017-10-16 MED ORDER — METFORMIN HCL 500 MG PO TABS: 1000.0000 mg | ORAL_TABLET | ORAL | Status: DC

## 2017-10-16 MED ORDER — INSULIN ASPART 100 UNIT/ML ~~LOC~~ SOLN
0.0000 [IU] | Freq: Three times a day (TID) | SUBCUTANEOUS | Status: DC
  Administered 2017-10-16: 3 [IU] via SUBCUTANEOUS
  Administered 2017-10-17 (×2): 2 [IU] via SUBCUTANEOUS
  Administered 2017-10-17 – 2017-10-18 (×2): 1 [IU] via SUBCUTANEOUS
  Administered 2017-10-18: 2 [IU] via SUBCUTANEOUS
  Administered 2017-10-19: 3 [IU] via SUBCUTANEOUS
  Administered 2017-10-19: 1 [IU] via SUBCUTANEOUS
  Administered 2017-10-20: 2 [IU] via SUBCUTANEOUS

## 2017-10-16 NOTE — Progress Notes (Signed)
Subjective: Interval History: none.. Comfortable this morning.  Reports some diminished swelling in his left thigh.  Objective: Vital signs in last 24 hours: Temp:  [97.8 F (36.6 C)-98.5 F (36.9 C)] 98.5 F (36.9 C) (12/15 0628) Pulse Rate:  [75-102] 75 (12/15 0628) Resp:  [13-26] 18 (12/15 0628) BP: (102-127)/(48-86) 102/48 (12/15 0628) SpO2:  [95 %-100 %] 95 % (12/15 0628) Weight:  [225 lb (102.1 kg)-229 lb (103.9 kg)] 229 lb (103.9 kg) (12/14 2132)  Intake/Output from previous day: No intake/output data recorded. Intake/Output this shift: No intake/output data recorded.  Less swelling in his left thigh.  Lab Results: Recent Labs    10/15/17 1813 10/16/17 0720  WBC 9.1 6.8  HGB 14.1 12.4*  HCT 40.9 36.2*  PLT 187 168   BMET Recent Labs    10/15/17 1813  NA 132*  K 4.8  CL 100*  CO2 22  GLUCOSE 127*  BUN 18  CREATININE 1.51*  CALCIUM 9.8    Studies/Results: US Venous Img Lower Unilateral Left  Result Date: 10/15/2017 CLINICAL DATA:  Left lower extremity swelling EXAM: LEFT LOWER EXTREMITY VENOUS DOPPLER ULTRASOUND TECHNIQUE: Gray-scale sonography with graded compression, as well as color Doppler and duplex ultrasound were performed to evaluate the lower extremity deep venous systems from the level of the common femoral vein and including the common femoral, femoral, profunda femoral, popliteal and calf veins including the posterior tibial, peroneal and gastrocnemius veins when visible. The superficial great saphenous vein was also interrogated. Spectral Doppler was utilized to evaluate flow at rest and with distal augmentation maneuvers in the common femoral, femoral and popliteal veins. COMPARISON:  None. FINDINGS: Contralateral Common Femoral Vein: Respiratory phasicity is normal and symmetric with the symptomatic side. No evidence of thrombus. Normal compressibility. Common Femoral Vein: Occlusive acute thrombus noted within the common femoral vein with  expansion of the vein. Saphenofemoral Junction: Occlusive thrombus noted in the saphenous femoral junction. Profunda Femoral Vein: Occlusive, acute thrombus noted. Femoral Vein: Occlusive acute thrombus noted. Popliteal Vein: Occlusive acute thrombus noted. Calf Veins: Occlusive acute thrombus noted. Superficial Great Saphenous Vein: Occlusive thrombus noted in the great saphenous vein in thigh and calf and short saphenous vein within the calf. Venous Reflux:  N/A Other Findings:  None. IMPRESSION: Extensive acute thrombus throughout the left lower extremity from the common femoral vein through the calf veins. Superficial thrombophlebitis within the greater saphenous and short saphenous veins in the left leg. Critical Value/emergent results were called by telephone at the time of interpretation on 10/15/2017 at 3:51 pm to Dr. Abigail Butts MCNEILL , who verbally acknowledged these results. Electronically Signed   By: Rolm Baptise M.D.   On: 10/15/2017 15:53   Anti-infectives: Anti-infectives (From admission, onward)   None      Assessment/Plan: s/p * No surgery found * Continued heparin therapy.  Plan lysis and venogram left leg DVT.  The patient reports provider visited this morning and was talking about what sounds potentially like a chest CT for pulmonary embolus.  Would recommend deferring this to reduce contrast since he will have additional contrast on Monday with very mild baseline renal insufficiency   LOS: 0 days   Sherren Mocha Reiner Loewen 10/16/2017, 9:35 AM

## 2017-10-16 NOTE — Progress Notes (Signed)
12/15: Extensive conversation with pt about not taking his own home medications because one doctor told him he could. He has seen hospital itemized summaries and does not want to pay what we charge for a pill. He wants to keep them in his plastic bag and take them himself.  Explained hospital policies and procedures and liability of the pharmacy and RN and how insurance works to cover medication costs..   Asked him to please send them home with wife because we can provide everything he takes. He said he would, but then kept says "well, I'll just take my own." I think he is at high risk of taking his own medications secretly anyway despite the warnings if we do not provide them on time.   Zurri Rudden S. Alford Highland, PharmD, [place] Clinical Staff Pharmacist Pager [phone]

## 2017-10-16 NOTE — Progress Notes (Signed)
[facility]                                                                              Patient Demographics  Brian Torres, is a 56 y.o. male, DOB - October 20, 1961, UEA:[id number]  Admit date - 10/15/2017   Admitting Physician Etta Quill, DO  Outpatient Primary MD for the patient is Cari Caraway, MD  Outpatient specialists:   LOS - 0  days   Medical records reviewed and are as summarized below:    Chief Complaint  Patient presents with  . Leg Pain       Brief summary   Per admit note by Dr. Alcario Drought on 12/14 Brian Torres is a 56 y.o. male with medical history significant of prior LLE DVT earlier this year was on 7 months of Xarelto, DM2, HTN, CKD stage 3.  Patient presents to the ED with L leg pain and swelling.  Onset earlier today.  Went to PCP.  Found large occlusive L leg DVT.  Vasc surg Dr. Donzetta Matters was contacted and had patient sent to ED for heparin gtt, admission, and lysis on Monday.   Assessment & Plan    Principal Problem:   Leg DVT (deep venous thromboembolism), acute, left ([place]) -Patient was initially found to have acute left DVT in 12/17, finished 7 months of Xarelto - Now presented again with left leg pain and swelling, Doppler ultrasound of the left lower extremity showed extensive acute thrombus throughout the left lower extremity from the common femoral vein through the calf veins with superficial thrombophlebitis and greater saphenous and short saphenous veins. -Currently on heparin drip, plan for thrombolysis on Monday, vascular surgery following  - unclear cause of the first DVT, Hypercoagulable syndrome (?), discussed with the patient, could not find any precipitatating factors.  Cannot find any records in epic about previous DVT. -Patient is currently on heparin drip, needs to have hypercoagulable workup outpatient or once off the heparin drip. -Patient will benefit from changing the anticoagulation to eliquis upon discharge, given  recurrent DVT  -I recommended VQ scan, if higher probability of PE, will need prolonged or lifelong anticoagulation.  Patient is however having no symptoms at this time of chest pain or shortness of breath/DOE, he wants to hold off on the imaging until vascular surgery recommends it.  No CTA chest due to renal insufficiency  Active Problems:   DM2 (diabetes mellitus, type 2) (HCC) -Continue Amaryl, Actos, patient wants to continue his home medications -Holding metformin secondary to renal insufficiency and will need contrast studies -CBG this morning 205, add sliding scale insulin    HTN (hypertension) -BP currently stable, continue    CKD stage 3 due to type 2 diabetes mellitus (HCC) -Hold metformin, lisinopril   Code Status: Full CODE STATUS DVT Prophylaxis: Heparin drip Family Communication: Discussed in detail with the patient, all imaging results, lab results explained to the patient   Disposition Plan:  Time Spent in minutes 35 minutes  Procedures:  None  Consultants:   Vascular surgery  Antimicrobials:      Medications  Scheduled Meds: . [START ON 10/17/2017] B-complex with vitamin C  1 tablet Oral Q24H  . [START ON 10/17/2017] fenofibrate  54 mg Oral Q24H  . glimepiride  4 mg Oral Q breakfast  . [START ON 10/17/2017] lisinopril  10 mg Oral Q24H  . metFORMIN  1,000 mg Oral 2 times per day  . [START ON 10/17/2017] multivitamin with minerals  1 tablet Oral Q24H  . [START ON 10/17/2017] pioglitazone  45 mg Oral Q breakfast   Continuous Infusions: . heparin 1,700 Units/hr (10/16/17 0646)   PRN Meds:.acetaminophen **OR** acetaminophen, ondansetron **OR** ondansetron (ZOFRAN) IV   Antibiotics   Anti-infectives (From admission, onward)   None        Subjective:   Brian Torres was seen and examined today.  Denies any specific complaint. Patient denies dizziness, chest pain, shortness of breath, abdominal pain, N/V/D/C, new weakness, numbess, tingling. No  acute events overnight.    Objective:   Vitals:   10/15/17 2045 10/15/17 2132 10/16/17 0628 10/16/17 0948  BP: 114/71 121/72 (!) 102/48 110/71  Pulse: 96 87 75 81  Resp:  19 18 18   Temp:  98.4 F (36.9 C) 98.5 F (36.9 C)   TempSrc:  Oral Oral   SpO2: 98% 98% 95%   Weight:  103.9 kg (229 lb)    Height:  6\' 4"  (1.93 m)     No intake or output data in the 24 hours ending 10/16/17 1216   Wt Readings from Last 3 Encounters:  10/15/17 103.9 kg (229 lb)     Exam  General: Alert and oriented x 3, NAD  Eyes:   HEENT:  Atraumatic, normocephalic  Cardiovascular: S1 S2 auscultated, no rubs, murmurs or gallops. Regular rate and rhythm.  Respiratory: Clear to auscultation bilaterally, no wheezing, rales or rhonchi  Gastrointestinal: Soft, nontender, nondistended, + bowel sounds  Ext: less swelling in the left lower extremity   Neuro: no new deficit  Musculoskeletal: No digital cyanosis, clubbing  Skin: No rashes  Psych: Normal affect and demeanor, alert and oriented x3    Data Reviewed:  I have personally reviewed following labs and imaging studies  Micro Results No results found for this or any previous visit (from the past 240 hour(s)).  Radiology Reports US Venous Img Lower Unilateral Left  Result Date: 10/15/2017 CLINICAL DATA:  Left lower extremity swelling EXAM: LEFT LOWER EXTREMITY VENOUS DOPPLER ULTRASOUND TECHNIQUE: Gray-scale sonography with graded compression, as well as color Doppler and duplex ultrasound were performed to evaluate the lower extremity deep venous systems from the level of the common femoral vein and including the common femoral, femoral, profunda femoral, popliteal and calf veins including the posterior tibial, peroneal and gastrocnemius veins when visible. The superficial great saphenous vein was also interrogated. Spectral Doppler was utilized to evaluate flow at rest and with distal augmentation maneuvers in the common femoral, femoral and  popliteal veins. COMPARISON:  None. FINDINGS: Contralateral Common Femoral Vein: Respiratory phasicity is normal and symmetric with the symptomatic side. No evidence of thrombus. Normal compressibility. Common Femoral Vein: Occlusive acute thrombus noted within the common femoral vein with expansion of the vein. Saphenofemoral Junction: Occlusive thrombus noted in the saphenous femoral junction. Profunda Femoral Vein: Occlusive, acute thrombus noted. Femoral Vein: Occlusive acute thrombus noted. Popliteal Vein: Occlusive acute thrombus noted. Calf Veins: Occlusive acute thrombus noted. Superficial Great Saphenous Vein: Occlusive thrombus noted in the great saphenous vein in thigh and calf and short saphenous vein within the calf. Venous Reflux:  N/A Other Findings:  None. IMPRESSION: Extensive acute thrombus throughout the left lower  extremity from the common femoral vein through the calf veins. Superficial thrombophlebitis within the greater saphenous and short saphenous veins in the left leg. Critical Value/emergent results were called by telephone at the time of interpretation on 10/15/2017 at 3:51 pm to Dr. Abigail Butts MCNEILL , who verbally acknowledged these results. Electronically Signed   By: Rolm Baptise M.D.   On: 10/15/2017 15:53    Lab Data:  CBC: Recent Labs  Lab 10/15/17 1813 10/16/17 0720  WBC 9.1 6.8  NEUTROABS 5.7  --   HGB 14.1 12.4*  HCT 40.9 36.2*  MCV 88.9 89.6  PLT 187 161   Basic Metabolic Panel: Recent Labs  Lab 10/15/17 1813 10/16/17 0944  NA 132* 132*  K 4.8 5.2*  CL 100* 102  CO2 22 23  GLUCOSE 127* 205*  BUN 18 21*  CREATININE 1.51* 1.54*  CALCIUM 9.8 8.9   GFR: Estimated Creatinine Clearance: 65 mL/min (A) (by C-G formula based on SCr of 1.54 mg/dL (H)). Liver Function Tests: No results for input(s): AST, ALT, ALKPHOS, BILITOT, PROT, ALBUMIN in the last 168 hours. No results for input(s): LIPASE, AMYLASE in the last 168 hours. No results for input(s):  AMMONIA in the last 168 hours. Coagulation Profile: No results for input(s): INR, PROTIME in the last 168 hours. Cardiac Enzymes: No results for input(s): CKTOTAL, CKMB, CKMBINDEX, TROPONINI in the last 168 hours. BNP (last 3 results) No results for input(s): PROBNP in the last 8760 hours. HbA1C: No results for input(s): HGBA1C in the last 72 hours. CBG: No results for input(s): GLUCAP in the last 168 hours. Lipid Profile: No results for input(s): CHOL, HDL, LDLCALC, TRIG, CHOLHDL, LDLDIRECT in the last 72 hours. Thyroid Function Tests: No results for input(s): TSH, T4TOTAL, FREET4, T3FREE, THYROIDAB in the last 72 hours. Anemia Panel: No results for input(s): VITAMINB12, FOLATE, FERRITIN, TIBC, IRON, RETICCTPCT in the last 72 hours. Urine analysis: No results found for: COLORURINE, APPEARANCEUR, LABSPEC, PHURINE, GLUCOSEU, HGBUR, BILIRUBINUR, KETONESUR, PROTEINUR, UROBILINOGEN, NITRITE, LEUKOCYTESUR   Ripudeep Rai M.D. [facility] 10/16/2017, 12:16 PM  Pager: [phone] Between 7am to 7pm - call Pager - [phone]  After 7pm go to [email or web address] - password [email or web address]  Call night coverage person covering after 7pm

## 2017-10-16 NOTE — Progress Notes (Signed)
ANTICOAGULATION CONSULT NOTE - Initial Consult  Pharmacy Consult for heparin Indication: DVT  No Known Allergies  Patient Measurements: TBW 102 kg IBW 86.8 kg Heparin Dosing Weight: 102 kg  Assessment: 56 yo M with confirmed DVT in L leg. Pharmacy consulted to dose heparin.   Heparin level therapeutic at 0.56. No new CBC, however, no s/s bleeding documented.   Goal of Therapy:  Heparin level 0.3-0.7 units/ml Monitor platelets by anticoagulation protocol: Yes   Plan:  Continue heparin gtt at 1700 units/hr Heparin level in 6 hrs to confirm  Daily heparin level and CBC Monitor for s/s bleeding   Lavonda Jumbo, PharmD Clinical Pharmacist 10/16/17 5:41 AM

## 2017-10-16 NOTE — Progress Notes (Signed)
ANTICOAGULATION CONSULT NOTE - Follow Up Consult  Pharmacy Consult for Heparin Indication: DVT  No Known Allergies  Patient Measurements: Height: 6\' 4"  (193 cm) Weight: 229 lb (103.9 kg) IBW/kg (Calculated) : 86.8   Vital Signs: Temp: 98.5 F (36.9 C) (12/15 0628) Temp Source: Oral (12/15 0628) BP: 110/71 (12/15 0948) Pulse Rate: 81 (12/15 0948)  Labs: Recent Labs    10/15/17 1813 10/16/17 0338 10/16/17 0720 10/16/17 0944  HGB 14.1  --  12.4*  --   HCT 40.9  --  36.2*  --   PLT 187  --  168  --   HEPARINUNFRC  --  0.56  --  0.48  CREATININE 1.51*  --   --  1.54*    Estimated Creatinine Clearance: 65 mL/min (A) (by C-G formula based on SCr of 1.54 mg/dL (H)).   Assessment: Anticoag: L leg DVT, Hgb 14.1>12.4. Plts stable. Heparin level 0.56, repeat level 0.48. - occlusion of his right common femoral, femoral and popliteal veins  Goal of Therapy:  Heparin level 0.3-0.7 units/ml Monitor platelets by anticoagulation protocol: Yes   Plan:  Continue heparin gtt at 1,700 units/hr Monitor daily heparin level, CBC, s/s of bleed Plan venography and possibly lysis on 12/17.  Alexiah Koroma S. Alford Highland, PharmD, BCPS Clinical Staff Pharmacist Pager [phone]  Eilene Ghazi Stillinger 10/16/2017,11:42 AM

## 2017-10-17 LAB — BASIC METABOLIC PANEL
Anion gap: 8 (ref 5–15)
BUN: 20 mg/dL (ref 6–20)
CALCIUM: 8.9 mg/dL (ref 8.9–10.3)
CO2: 24 mmol/L (ref 22–32)
CREATININE: 1.34 mg/dL — AB (ref 0.61–1.24)
Chloride: 104 mmol/L (ref 101–111)
GFR calc Af Amer: >60 mL/min (ref >60)
GFR, EST NON AFRICAN AMERICAN: 57 mL/min — AB (ref >60)
Glucose, Bld: 132 mg/dL — ABNORMAL HIGH (ref 65–99)
Potassium: 4.4 mmol/L (ref 3.5–5.1)
SODIUM: 136 mmol/L (ref 135–145)

## 2017-10-17 LAB — GLUCOSE, CAPILLARY
GLUCOSE-CAPILLARY: 138 mg/dL — AB (ref 65–99)
GLUCOSE-CAPILLARY: 192 mg/dL — AB (ref 65–99)
Glucose-Capillary: 178 mg/dL — ABNORMAL HIGH (ref 65–99)
Glucose-Capillary: 162 mg/dL — ABNORMAL HIGH (ref 65–99)

## 2017-10-17 LAB — CBC
HCT: 35.5 % — ABNORMAL LOW (ref 39.0–52.0)
Hemoglobin: 12.3 g/dL — ABNORMAL LOW (ref 13.0–17.0)
MCH: 31 pg (ref 26.0–34.0)
MCHC: 34.6 g/dL (ref 30.0–36.0)
MCV: 89.4 fL (ref 78.0–100.0)
Platelets: 158 10*3/uL (ref 150–400)
RBC: 3.97 MIL/uL — ABNORMAL LOW (ref 4.22–5.81)
RDW: 13.3 % (ref 11.5–15.5)
WBC: 5.4 10*3/uL (ref 4.0–10.5)

## 2017-10-17 LAB — HEMOGLOBIN A1C
HEMOGLOBIN A1C: 7.7 % — AB (ref 4.8–5.6)
MEAN PLASMA GLUCOSE: 174 mg/dL

## 2017-10-17 LAB — HEPARIN LEVEL (UNFRACTIONATED): HEPARIN UNFRACTIONATED: 0.39 [IU]/mL (ref 0.30–0.70)

## 2017-10-17 MED ORDER — CEFAZOLIN SODIUM-DEXTROSE 1-4 GM/50ML-% IV SOLN: 1.0000 g | INTRAVENOUS | Status: DC

## 2017-10-17 MED ORDER — CEFAZOLIN SODIUM-DEXTROSE 2-4 GM/100ML-% IV SOLN: 2.0000 g | INTRAVENOUS | Status: DC

## 2017-10-17 NOTE — Progress Notes (Signed)
ANTICOAGULATION CONSULT NOTE - Follow Up Consult  Pharmacy Consult for Heparin Indication: DVT  No Known Allergies  Patient Measurements: Height: 6\' 4"  (193 cm) Weight: 229 lb (103.9 kg) IBW/kg (Calculated) : 86.8   Vital Signs: Temp: 97.9 F (36.6 C) (12/16 0602) Temp Source: Oral (12/16 0602) BP: 121/73 (12/16 0602) Pulse Rate: 74 (12/16 0602)  Labs: Recent Labs    10/15/17 1813 10/16/17 0338 10/16/17 0720 10/16/17 0944 10/17/17 0547  HGB 14.1  --  12.4*  --  12.3*  HCT 40.9  --  36.2*  --  35.5*  PLT 187  --  168  --  158  HEPARINUNFRC  --  0.56  --  0.48 0.39  CREATININE 1.51*  --   --  1.54* 1.34*    Estimated Creatinine Clearance: 74.7 mL/min (A) (by C-G formula based on SCr of 1.34 mg/dL (H)).   Assessment:  Anticoag: L leg DVT, Hgb 14.1>12.3. Plts 187>158 ok. Heparin level 0.39 remains in goal. - occlusion of his right common femoral, femoral and popliteal veins  Goal of Therapy:  Heparin level 0.3-0.7 units/ml Monitor platelets by anticoagulation protocol: Yes   Plan:  Continue heparin gtt at 1,700 units/hr Monitor daily heparin level, CBC, s/s of bleed Plan venography and possibly lysis on 12/17.  Kein Carlberg S. Alford Highland, PharmD, BCPS Clinical Staff Pharmacist Pager [phone]  Eilene Ghazi Stillinger 10/17/2017,9:01 AM

## 2017-10-17 NOTE — Progress Notes (Signed)
  Progress Note    10/17/2017 11:08 AM * No surgery found *  Subjective: No new complaints per patient   Vitals:   10/16/17 2117 10/17/17 0602  BP: 120/70 121/73  Pulse: 78 74  Resp: 18 18  Temp: 98.2 F (36.8 C) 97.9 F (36.6 C)  SpO2: 97% 100%   Physical Exam: Cardiac: RRR Lungs: Nonlabored breathing Incisions: None Extremities: Still mild edema left leg greater than right Abdomen: Soft Neurologic: Moving all extremities well  CBC    Component Value Date/Time   WBC 5.4 10/17/2017 0547   RBC 3.97 (L) 10/17/2017 0547   HGB 12.3 (L) 10/17/2017 0547   HCT 35.5 (L) 10/17/2017 0547   PLT 158 10/17/2017 0547   MCV 89.4 10/17/2017 0547   MCH 31.0 10/17/2017 0547   MCHC 34.6 10/17/2017 0547   RDW 13.3 10/17/2017 0547   LYMPHSABS 2.2 10/15/2017 1813   MONOABS 0.8 10/15/2017 1813   EOSABS 0.4 10/15/2017 1813   BASOSABS 0.0 10/15/2017 1813    BMET    Component Value Date/Time   NA 136 10/17/2017 0547   K 4.4 10/17/2017 0547   CL 104 10/17/2017 0547   CO2 24 10/17/2017 0547   GLUCOSE 132 (H) 10/17/2017 0547   BUN 20 10/17/2017 0547   CREATININE 1.34 (H) 10/17/2017 0547   CALCIUM 8.9 10/17/2017 0547   GFRNONAA 57 (L) 10/17/2017 0547   GFRAA >60 10/17/2017 0547    INR No results found for: INR   Intake/Output Summary (Last 24 hours) at 10/17/2017 1108 Last data filed at 10/17/2017 0602 Gross per 24 hour  Intake 1280 ml  Output -  Net 1280 ml     Assessment/Plan:  56 y.o. male is with acute DVT left lower extremity extending proximally to the level of the common femoral vein  Plan is for left lower extremity venous thrombolysis with possible stent placement by Dr. Donzetta Matters tomorrow 10/18/17 Risks and benefits of this procedure including AKI were discussed with the patient and he agrees to proceed N.p.o. after midnight Nursing staff to obtain consent signature Preoperative labs have been ordered for the morning   Dagoberto Ligas, PA-C [facility] [phone] 10/17/2017 11:08 AM

## 2017-10-17 NOTE — Progress Notes (Signed)
[facility]                                                                              Patient Demographics  Brian Torres, is a 56 y.o. male, DOB - December 18, 1960, BZJ:[id number]  Admit date - 10/15/2017   Admitting Physician Etta Quill, DO  Outpatient Primary MD for the patient is Cari Caraway, MD  Outpatient specialists:   LOS - 1  days   Medical records reviewed and are as summarized below:    Chief Complaint  Patient presents with  . Leg Pain       Brief summary   Per admit note by Dr. Alcario Drought on 12/14 Brian Torres is a 56 y.o. male with medical history significant of prior LLE DVT earlier this year was on 7 months of Xarelto, DM2, HTN, CKD stage 3.  Patient presents to the ED with L leg pain and swelling.  Onset earlier today.  Went to PCP.  Found large occlusive L leg DVT.  Vasc surg Dr. Donzetta Matters was contacted and had patient sent to ED for heparin gtt, admission, and lysis on Monday.   Assessment & Plan    Principal Problem:   Leg DVT (deep venous thromboembolism), acute, left ([place]) -Patient was initially found to have acute left DVT in 12/17, finished 7 months of Xarelto - Now presented again with left leg pain and swelling, Doppler ultrasound of the left lower extremity showed extensive acute thrombus throughout the left lower extremity from the common femoral vein through the calf veins with superficial thrombophlebitis and greater saphenous and short saphenous veins. -Currently on heparin drip, plan for thrombolysis on 12/17 - unclear cause of the first DVT, Hypercoagulable syndrome (?), discussed with the patient, could not find any precipitating factors.  Cannot find any records in epic about previous DVT. -Patient is currently on heparin drip, needs to have hypercoagulable workup outpatient or once off the heparin drip. -Patient will benefit from changing the anticoagulation to eliquis upon discharge, given recurrent DVT  -I recommended VQ  scan, if higher probability of PE, will need prolonged or lifelong anticoagulation.  Patient is however having no symptoms at this time of chest pain or shortness of breath/DOE, he wants to hold off on the imaging until vascular surgery recommends it.  No CTA chest due to renal insufficiency - NPO after MN  Active Problems:   DM2 (diabetes mellitus, type 2) (HCC) -Continue Amaryl, Actos, patient wants to continue his home medications -Continue to hold metformin, placed on sliding scale insulin    HTN (hypertension) -BP currently stable, continue  Acute on CKD stage 3 due to type 2 diabetes mellitus ([place]) -Creatinine improving, 1.5 on admission, improved to 1.3.   Code Status: Full CODE STATUS DVT Prophylaxis: Heparin drip Family Communication: Discussed in detail with the patient, all imaging results, lab results explained to the patient and wife   Disposition Plan:  Time Spent in minutes 25 minutes  Procedures:  None  Consultants:   Vascular surgery  Antimicrobials:      Medications  Scheduled Meds: . B-complex with vitamin C  1 tablet Oral Q24H  .  fenofibrate  54 mg Oral Q24H  . glimepiride  4 mg Oral Q breakfast  . insulin aspart  0-9 Units Subcutaneous TID WC  . multivitamin with minerals  1 tablet Oral Q24H  . pioglitazone  45 mg Oral Q breakfast   Continuous Infusions: . [START ON 10/18/2017]  ceFAZolin (ANCEF) IV    . heparin 1,700 Units/hr (10/16/17 2217)   PRN Meds:.acetaminophen **OR** acetaminophen, ondansetron **OR** ondansetron (ZOFRAN) IV   Antibiotics   Anti-infectives (From admission, onward)   Start     Dose/Rate Route Frequency Ordered Stop   10/18/17 0600  ceFAZolin (ANCEF) IVPB 1 g/50 mL premix  Status:  Discontinued     1 g 100 mL/hr over 30 Minutes Intravenous On call to O.R. 10/17/17 1115 10/17/17 1156   10/18/17 0600  ceFAZolin (ANCEF) IVPB 2g/100 mL premix     2 g 200 mL/hr over 30 Minutes Intravenous On call to O.R. 10/17/17 1156  10/19/17 0559        Subjective:   Brian Torres was seen and examined today.  Ambulating without any difficulty, denies any specific complaints.  Denies any specific complaint. Patient denies dizziness, chest pain, shortness of breath, abdominal pain, N/V/D/C, new weakness, numbess, tingling. No acute events overnight.    Objective:   Vitals:   10/16/17 0948 10/16/17 1534 10/16/17 2117 10/17/17 0602  BP: 110/71 107/64 120/70 121/73  Pulse: 81 89 78 74  Resp: 18 18 18 18   Temp:  98.4 F (36.9 C) 98.2 F (36.8 C) 97.9 F (36.6 C)  TempSrc:  Oral Oral Oral  SpO2:  99% 97% 100%  Weight:      Height:        Intake/Output Summary (Last 24 hours) at 10/17/2017 1215 Last data filed at 10/17/2017 0602 Gross per 24 hour  Intake 1280 ml  Output -  Net 1280 ml     Wt Readings from Last 3 Encounters:  10/15/17 103.9 kg (229 lb)     Exam   General: Alert and oriented x 3, NAD  Eyes:   HEENT:    Cardiovascular: S1 S2 auscultated, no rubs, murmurs or gallops. Regular rate and rhythm. No pedal edema b/l  Respiratory: Clear to auscultation bilaterally, no wheezing, rales or rhonchi  Gastrointestinal: Soft, nontender, nondistended, + bowel sounds  Ext: no pedal edema bilaterally  Neuro: no new deficits  Musculoskeletal: No digital cyanosis, clubbing  Skin: No rashes  Psych: Normal affect and demeanor, alert and oriented x3   Data Reviewed:  I have personally reviewed following labs and imaging studies  Micro Results No results found for this or any previous visit (from the past 240 hour(s)).  Radiology Reports US Venous Img Lower Unilateral Left  Result Date: 10/15/2017 CLINICAL DATA:  Left lower extremity swelling EXAM: LEFT LOWER EXTREMITY VENOUS DOPPLER ULTRASOUND TECHNIQUE: Gray-scale sonography with graded compression, as well as color Doppler and duplex ultrasound were performed to evaluate the lower extremity deep venous systems from the level of the  common femoral vein and including the common femoral, femoral, profunda femoral, popliteal and calf veins including the posterior tibial, peroneal and gastrocnemius veins when visible. The superficial great saphenous vein was also interrogated. Spectral Doppler was utilized to evaluate flow at rest and with distal augmentation maneuvers in the common femoral, femoral and popliteal veins. COMPARISON:  None. FINDINGS: Contralateral Common Femoral Vein: Respiratory phasicity is normal and symmetric with the symptomatic side. No evidence of thrombus. Normal compressibility. Common Femoral Vein: Occlusive acute thrombus noted  within the common femoral vein with expansion of the vein. Saphenofemoral Junction: Occlusive thrombus noted in the saphenous femoral junction. Profunda Femoral Vein: Occlusive, acute thrombus noted. Femoral Vein: Occlusive acute thrombus noted. Popliteal Vein: Occlusive acute thrombus noted. Calf Veins: Occlusive acute thrombus noted. Superficial Great Saphenous Vein: Occlusive thrombus noted in the great saphenous vein in thigh and calf and short saphenous vein within the calf. Venous Reflux:  N/A Other Findings:  None. IMPRESSION: Extensive acute thrombus throughout the left lower extremity from the common femoral vein through the calf veins. Superficial thrombophlebitis within the greater saphenous and short saphenous veins in the left leg. Critical Value/emergent results were called by telephone at the time of interpretation on 10/15/2017 at 3:51 pm to Dr. Abigail Butts MCNEILL , who verbally acknowledged these results. Electronically Signed   By: Rolm Baptise M.D.   On: 10/15/2017 15:53    Lab Data:  CBC: Recent Labs  Lab 10/15/17 1813 10/16/17 0720 10/17/17 0547  WBC 9.1 6.8 5.4  NEUTROABS 5.7  --   --   HGB 14.1 12.4* 12.3*  HCT 40.9 36.2* 35.5*  MCV 88.9 89.6 89.4  PLT 187 168 185   Basic Metabolic Panel: Recent Labs  Lab 10/15/17 1813 10/16/17 0944 10/17/17 0547  NA 132*  132* 136  K 4.8 5.2* 4.4  CL 100* 102 104  CO2 22 23 24   GLUCOSE 127* 205* 132*  BUN 18 21* 20  CREATININE 1.51* 1.54* 1.34*  CALCIUM 9.8 8.9 8.9   GFR: Estimated Creatinine Clearance: 74.7 mL/min (A) (by C-G formula based on SCr of 1.34 mg/dL (H)). Liver Function Tests: No results for input(s): AST, ALT, ALKPHOS, BILITOT, PROT, ALBUMIN in the last 168 hours. No results for input(s): LIPASE, AMYLASE in the last 168 hours. No results for input(s): AMMONIA in the last 168 hours. Coagulation Profile: No results for input(s): INR, PROTIME in the last 168 hours. Cardiac Enzymes: No results for input(s): CKTOTAL, CKMB, CKMBINDEX, TROPONINI in the last 168 hours. BNP (last 3 results) No results for input(s): PROBNP in the last 8760 hours. HbA1C: No results for input(s): HGBA1C in the last 72 hours. CBG: Recent Labs  Lab 10/16/17 1746 10/16/17 2103 10/17/17 0743 10/17/17 1209  GLUCAP 210* 214* 138* 178*   Lipid Profile: No results for input(s): CHOL, HDL, LDLCALC, TRIG, CHOLHDL, LDLDIRECT in the last 72 hours. Thyroid Function Tests: No results for input(s): TSH, T4TOTAL, FREET4, T3FREE, THYROIDAB in the last 72 hours. Anemia Panel: No results for input(s): VITAMINB12, FOLATE, FERRITIN, TIBC, IRON, RETICCTPCT in the last 72 hours. Urine analysis: No results found for: COLORURINE, APPEARANCEUR, LABSPEC, PHURINE, GLUCOSEU, HGBUR, BILIRUBINUR, KETONESUR, PROTEINUR, UROBILINOGEN, NITRITE, LEUKOCYTESUR   Ripudeep Rai M.D. [facility] 10/17/2017, 12:15 PM  Pager: [phone] Between 7am to 7pm - call Pager - [phone]  After 7pm go to [email or web address] - password [email or web address]  Call night coverage person covering after 7pm

## 2017-10-18 ENCOUNTER — Encounter (HOSPITAL_COMMUNITY)
Admission: EM | Disposition: A | Discharge status: Home / Self Care | Payer: Self-pay | Source: Home / Self Care | Attending: Internal Medicine

## 2017-10-18 DIAGNOSIS — I82402 Acute embolism and thrombosis of unspecified deep veins of left lower extremity: Secondary | ICD-10-CM

## 2017-10-18 HISTORY — PX: LOWER EXTREMITY ANGIOGRAPHY: CATH118251

## 2017-10-18 HISTORY — PX: PERIPHERAL VASCULAR INTERVENTION: CATH118257

## 2017-10-18 LAB — CBC
HCT: 34.4 % — ABNORMAL LOW (ref 39.0–52.0)
HCT: 34.2 % — ABNORMAL LOW (ref 39.0–52.0)
HEMATOCRIT: 32.4 % — AB (ref 39.0–52.0)
HEMOGLOBIN: 11.6 g/dL — AB (ref 13.0–17.0)
Hemoglobin: 11.8 g/dL — ABNORMAL LOW (ref 13.0–17.0)
Hemoglobin: 11 g/dL — ABNORMAL LOW (ref 13.0–17.0)
MCH: 30.1 pg (ref 26.0–34.0)
MCH: 30.7 pg (ref 26.0–34.0)
MCH: 30.2 pg (ref 26.0–34.0)
MCHC: 33.7 g/dL (ref 30.0–36.0)
MCHC: 34.5 g/dL (ref 30.0–36.0)
MCHC: 34 g/dL (ref 30.0–36.0)
MCV: 89.1 fL (ref 78.0–100.0)
MCV: 89.1 fL (ref 78.0–100.0)
MCV: 89 fL (ref 78.0–100.0)
PLATELETS: 179 10*3/uL (ref 150–400)
PLATELETS: 180 10*3/uL (ref 150–400)
PLATELETS: 124 10*3/uL — AB (ref 150–400)
RBC: 3.86 MIL/uL — ABNORMAL LOW (ref 4.22–5.81)
RBC: 3.84 MIL/uL — AB (ref 4.22–5.81)
RBC: 3.64 MIL/uL — ABNORMAL LOW (ref 4.22–5.81)
RDW: 13.2 % (ref 11.5–15.5)
RDW: 13 % (ref 11.5–15.5)
RDW: 13.2 % (ref 11.5–15.5)
WBC: 4.8 10*3/uL (ref 4.0–10.5)
WBC: 4.7 10*3/uL (ref 4.0–10.5)
WBC: 6.4 10*3/uL (ref 4.0–10.5)

## 2017-10-18 LAB — HEPARIN LEVEL (UNFRACTIONATED)
HEPARIN UNFRACTIONATED: 0.44 [IU]/mL (ref 0.30–0.70)
HEPARIN UNFRACTIONATED: 0.62 [IU]/mL (ref 0.30–0.70)
Heparin Unfractionated: <0.1 IU/mL — ABNORMAL LOW (ref 0.30–0.70)

## 2017-10-18 LAB — GLUCOSE, CAPILLARY
GLUCOSE-CAPILLARY: 166 mg/dL — AB (ref 65–99)
Glucose-Capillary: 147 mg/dL — ABNORMAL HIGH (ref 65–99)
Glucose-Capillary: 111 mg/dL — ABNORMAL HIGH (ref 65–99)
Glucose-Capillary: 135 mg/dL — ABNORMAL HIGH (ref 65–99)

## 2017-10-18 LAB — BASIC METABOLIC PANEL
Anion gap: 9 (ref 5–15)
BUN: 16 mg/dL (ref 6–20)
CO2: 22 mmol/L (ref 22–32)
Calcium: 8.8 mg/dL — ABNORMAL LOW (ref 8.9–10.3)
Chloride: 105 mmol/L (ref 101–111)
Creatinine, Ser: 1.2 mg/dL (ref 0.61–1.24)
GFR calc Af Amer: >60 mL/min (ref >60)
GLUCOSE: 220 mg/dL — AB (ref 65–99)
POTASSIUM: 4.2 mmol/L (ref 3.5–5.1)
Sodium: 136 mmol/L (ref 135–145)

## 2017-10-18 LAB — MRSA PCR SCREENING: MRSA BY PCR: NEGATIVE

## 2017-10-18 LAB — PROTIME-INR
INR: 0.99
Prothrombin Time: 13 seconds (ref 11.4–15.2)

## 2017-10-18 LAB — FIBRINOGEN
FIBRINOGEN: 420 mg/dL (ref 210–475)
FIBRINOGEN: 104 mg/dL — AB (ref 210–475)

## 2017-10-18 SURGERY — LOWER EXTREMITY ANGIOGRAPHY
Anesthesia: LOCAL

## 2017-10-18 MED ORDER — HYDRALAZINE HCL 20 MG/ML IJ SOLN: 5.0000 mg | INTRAMUSCULAR | Status: DC | PRN

## 2017-10-18 MED ORDER — SODIUM CHLORIDE 0.9% FLUSH: 3.0000 mL | INTRAVENOUS | Status: DC | PRN

## 2017-10-18 MED ORDER — MIDAZOLAM HCL 2 MG/2ML IJ SOLN
INTRAMUSCULAR | Status: DC | PRN
  Administered 2017-10-18 (×2): 1 mg via INTRAVENOUS

## 2017-10-18 MED ORDER — MORPHINE SULFATE (PF) 4 MG/ML IV SOLN: 5.0000 mg | INTRAVENOUS | Status: DC | PRN

## 2017-10-18 MED ORDER — METOPROLOL TARTRATE 5 MG/5ML IV SOLN
5.0000 mg | Freq: Four times a day (QID) | INTRAVENOUS | Status: DC
  Administered 2017-10-18 – 2017-10-20 (×7): 5 mg via INTRAVENOUS
  Filled 2017-10-18 (×7): qty 5

## 2017-10-18 MED ORDER — HEPARIN SODIUM (PORCINE) 1000 UNIT/ML IJ SOLN
INTRAMUSCULAR | Status: AC
  Filled 2017-10-18: qty 1

## 2017-10-18 MED ORDER — LIDOCAINE HCL (PF) 1 % IJ SOLN
INTRAMUSCULAR | Status: DC | PRN
  Administered 2017-10-18: 5 mL via INTRADERMAL

## 2017-10-18 MED ORDER — SODIUM CHLORIDE 0.9 % IV SOLN
1.0000 mg/h | INTRAVENOUS | Status: DC
  Administered 2017-10-18 – 2017-10-19 (×2): 1 mg/h
  Filled 2017-10-18 (×4): qty 10

## 2017-10-18 MED ORDER — FENTANYL CITRATE (PF) 100 MCG/2ML IJ SOLN
INTRAMUSCULAR | Status: DC | PRN
  Administered 2017-10-18 (×3): 50 ug via INTRAVENOUS

## 2017-10-18 MED ORDER — HEPARIN SODIUM (PORCINE) 1000 UNIT/ML IJ SOLN
INTRAMUSCULAR | Status: DC | PRN
  Administered 2017-10-18: 6000 [IU] via INTRAVENOUS

## 2017-10-18 MED ORDER — HEPARIN (PORCINE) IN NACL 2-0.9 UNIT/ML-% IJ SOLN
INTRAMUSCULAR | Status: AC
  Filled 2017-10-18: qty 500

## 2017-10-18 MED ORDER — LABETALOL HCL 5 MG/ML IV SOLN: 10.0000 mg | INTRAVENOUS | Status: DC | PRN

## 2017-10-18 MED ORDER — SODIUM CHLORIDE 0.9% FLUSH
3.0000 mL | Freq: Two times a day (BID) | INTRAVENOUS | Status: DC
  Administered 2017-10-18: 3 mL via INTRAVENOUS

## 2017-10-18 MED ORDER — IODIXANOL 320 MG/ML IV SOLN
INTRAVENOUS | Status: DC | PRN
  Administered 2017-10-18: 20 mL via INTRAVENOUS

## 2017-10-18 MED ORDER — SODIUM CHLORIDE 0.9 % IV SOLN
INTRAVENOUS | Status: DC
  Administered 2017-10-18 (×2): via INTRAVENOUS

## 2017-10-18 MED ORDER — MIDAZOLAM HCL 2 MG/2ML IJ SOLN: 1.0000 mg | INTRAMUSCULAR | Status: DC | PRN

## 2017-10-18 MED ORDER — MIDAZOLAM HCL 2 MG/2ML IJ SOLN
INTRAMUSCULAR | Status: AC
  Filled 2017-10-18: qty 2

## 2017-10-18 MED ORDER — HEPARIN (PORCINE) IN NACL 2-0.9 UNIT/ML-% IJ SOLN
INTRAMUSCULAR | Status: AC | PRN
  Administered 2017-10-18: 500 mL

## 2017-10-18 MED ORDER — SODIUM CHLORIDE 0.9 % IV SOLN: INTRAVENOUS | Status: DC

## 2017-10-18 MED ORDER — CEFAZOLIN SODIUM-DEXTROSE 2-4 GM/100ML-% IV SOLN
2.0000 g | Freq: Three times a day (TID) | INTRAVENOUS | Status: DC
  Administered 2017-10-18 – 2017-10-19 (×2): 2 g via INTRAVENOUS
  Filled 2017-10-18 (×5): qty 100

## 2017-10-18 MED ORDER — ONDANSETRON HCL 4 MG/2ML IJ SOLN: 4.0000 mg | Freq: Four times a day (QID) | INTRAMUSCULAR | Status: DC | PRN

## 2017-10-18 MED ORDER — HEPARIN (PORCINE) IN NACL 100-0.45 UNIT/ML-% IJ SOLN
1700.0000 [IU]/h | INTRAMUSCULAR | Status: DC
  Filled 2017-10-18: qty 250

## 2017-10-18 MED ORDER — LIDOCAINE HCL (PF) 1 % IJ SOLN
INTRAMUSCULAR | Status: AC
  Filled 2017-10-18: qty 30

## 2017-10-18 MED ORDER — SODIUM CHLORIDE 0.9 % IV SOLN: 250.0000 mL | INTRAVENOUS | Status: DC | PRN

## 2017-10-18 MED ORDER — FENTANYL CITRATE (PF) 100 MCG/2ML IJ SOLN
INTRAMUSCULAR | Status: AC
  Filled 2017-10-18: qty 2

## 2017-10-18 SURGICAL SUPPLY — 18 items
CATH ANGIO 5F BER2 100CM (CATHETERS) ×1 IMPLANT
CATH INFUS 135CMX50CM (CATHETERS) ×1 IMPLANT
CATH VISIONS PV .035 IVUS (CATHETERS) ×1 IMPLANT
COVER PRB 48X5XTLSCP FOLD TPE (BAG) IMPLANT
COVER PROBE 5X48 (BAG) ×3
DEVICE TORQUE .025-.038 (MISCELLANEOUS) ×1 IMPLANT
GUIDEWIRE ANGLED .035X260CM (WIRE) ×1 IMPLANT
KIT MICROINTRODUCER STIFF 5F (SHEATH) ×2 IMPLANT
KIT PV (KITS) ×3 IMPLANT
SHEATH PINNACLE 5F 10CM (SHEATH) ×1 IMPLANT
SHEATH PINNACLE 8F 10CM (SHEATH) ×1 IMPLANT
SYR MEDRAD MARK V 150ML (SYRINGE) ×3 IMPLANT
TRANSDUCER W/STOPCOCK (MISCELLANEOUS) ×3 IMPLANT
TRAY PV CATH (CUSTOM PROCEDURE TRAY) ×3 IMPLANT
WIRE AMPLATZ SS-J .035X260CM (WIRE) ×1 IMPLANT
WIRE BENTSON .035X145CM (WIRE) ×1 IMPLANT
WIRE MICROINTRODUCER 60CM (WIRE) ×1 IMPLANT
WIRE TORQFLEX AUST .018X40CM (WIRE) ×3 IMPLANT

## 2017-10-18 NOTE — Progress Notes (Signed)
[facility]                                                                              Patient Demographics  Brian Torres, is a 56 y.o. male, DOB - 22-Dec-1960, POE:[id number]  Admit date - 10/15/2017   Admitting Physician Etta Quill, DO  Outpatient Primary MD for the patient is Cari Caraway, MD  Outpatient specialists:   LOS - 2  days   Medical records reviewed and are as summarized below:    Chief Complaint  Patient presents with  . Leg Pain       Brief summary   Per admit note by Dr. Alcario Drought on 12/14 Brian Torres is a 56 y.o. male with medical history significant of prior LLE DVT earlier this year was on 7 months of Xarelto, DM2, HTN, CKD stage 3.  Patient presents to the ED with L leg pain and swelling.  Onset earlier today.  Went to PCP.  Found large occlusive L leg DVT.  Vasc surg Dr. Donzetta Matters was contacted and had patient sent to ED for heparin gtt, admission, and lysis on Monday.   Assessment & Plan    Principal Problem:   Leg DVT (deep venous thromboembolism), acute, left ([place]) -Patient was initially found to have acute left DVT in 12/17, finished 7 months of Xarelto - Now presented again with left leg pain and swelling, Doppler ultrasound of the left lower extremity showed extensive acute thrombus throughout the left lower extremity from the common femoral vein through the calf veins with superficial thrombophlebitis and greater saphenous and short saphenous veins. - unclear cause of the first DVT, Hypercoagulable syndrome (?), discussed with the patient, could not find any precipitating factors.  Cannot find any records in epic about previous DVT. -needs to have hypercoagulable workup outpatient or once off the heparin drip. -I recommended VQ scan, if higher probability of PE, will need prolonged or lifelong anticoagulation.  Patient is however having no symptoms at this time of chest pain or shortness of breath/DOE, he wants to hold off on  the imaging until vascular surgery recommends it.  No CTA chest due to renal insufficiency -Plan for venogram, thrombolysis for extensive left leg DVT today, currently n.p.o. -Continue IV heparin drip.  Patient will benefit from changing the anticoagulation to Eliquis upon discharge given recurrent DVT  Active Problems:   DM2 (diabetes mellitus, type 2) (HCC) -Continue Amaryl, Actos, patient wants to continue his home medications -Metformin held due to acute renal insufficiency and need for contrast agents.  Continue sliding scale insulin while inpatient.    HTN (hypertension) -BP currently stable  Acute on CKD stage 3 due to type 2 diabetes mellitus (HCC) -Creatinine improving, 1.5 on admission, improved to 1.2  Code Status: Full CODE STATUS DVT Prophylaxis: Heparin drip Family Communication: Discussed in detail with the patient, all imaging results, lab results explained to the patient and wife at the bedside   Disposition Plan: Possibly tomorrow when cleared by vascular surgery  Time Spent in minutes 25 minutes  Procedures:  None  Consultants:   Vascular surgery  Antimicrobials:      Medications  Scheduled Meds: . B-complex with vitamin C  1 tablet Oral Q24H  . fenofibrate  54 mg Oral Q24H  . glimepiride  4 mg Oral Q breakfast  . insulin aspart  0-9 Units Subcutaneous TID WC  . multivitamin with minerals  1 tablet Oral Q24H  . pioglitazone  45 mg Oral Q breakfast   Continuous Infusions: . sodium chloride    .  ceFAZolin (ANCEF) IV    . heparin 1,700 Units/hr (10/18/17 0308)   PRN Meds:.acetaminophen **OR** acetaminophen, ondansetron **OR** ondansetron (ZOFRAN) IV   Antibiotics   Anti-infectives (From admission, onward)   Start     Dose/Rate Route Frequency Ordered Stop   10/18/17 0600  ceFAZolin (ANCEF) IVPB 1 g/50 mL premix  Status:  Discontinued     1 g 100 mL/hr over 30 Minutes Intravenous On call to O.R. 10/17/17 1115 10/17/17 1156   10/18/17 0600   ceFAZolin (ANCEF) IVPB 2g/100 mL premix     2 g 200 mL/hr over 30 Minutes Intravenous On call to O.R. 10/17/17 1156 10/19/17 0559        Subjective:   Luvenia Starch was seen and examined today.  Denies any specific complaints, awaiting procedure today.  Patient denies dizziness, chest pain, shortness of breath, abdominal pain, N/V/D/C, new weakness, numbess, tingling. No acute events overnight.    Objective:   Vitals:   10/17/17 0602 10/17/17 1508 10/17/17 2208 10/18/17 0618  BP: 121/73 133/75 138/74 117/69  Pulse: 74 80 78 70  Resp: 18 18 18 17   Temp: 97.9 F (36.6 C) 97.9 F (36.6 C) 98.4 F (36.9 C) 98.4 F (36.9 C)  TempSrc: Oral Oral Oral   SpO2: 100% 100% 100% 100%  Weight:      Height:        Intake/Output Summary (Last 24 hours) at 10/18/2017 1144 Last data filed at 10/18/2017 0841 Gross per 24 hour  Intake 342 ml  Output -  Net 342 ml     Wt Readings from Last 3 Encounters:  10/15/17 103.9 kg (229 lb)     Exam   General: Alert and oriented x 3, NAD  Eyes:   HEENT:  Atraumatic, normocephalic  Cardiovascular: S1 S2 auscultated, Regular rate and rhythm.  Left leg swelling and tightness compared to right  Respiratory: Clear to auscultation bilaterally, no wheezing, rales or rhonchi  Gastrointestinal: Soft, nontender, nondistended, + bowel sounds  Ext: left leg swelling improving  Neuro: no neuro deficits  Musculoskeletal: No digital cyanosis, clubbing  Skin: No rashes  Psych: Normal affect and demeanor, alert and oriented x3   Data Reviewed:  I have personally reviewed following labs and imaging studies  Micro Results No results found for this or any previous visit (from the past 240 hour(s)).  Radiology Reports US Venous Img Lower Unilateral Left  Result Date: 10/15/2017 CLINICAL DATA:  Left lower extremity swelling EXAM: LEFT LOWER EXTREMITY VENOUS DOPPLER ULTRASOUND TECHNIQUE: Gray-scale sonography with graded compression, as well  as color Doppler and duplex ultrasound were performed to evaluate the lower extremity deep venous systems from the level of the common femoral vein and including the common femoral, femoral, profunda femoral, popliteal and calf veins including the posterior tibial, peroneal and gastrocnemius veins when visible. The superficial great saphenous vein was also interrogated. Spectral Doppler was utilized to evaluate flow at rest and with distal augmentation maneuvers in the common femoral, femoral and popliteal veins. COMPARISON:  None. FINDINGS: Contralateral Common Femoral Vein: Respiratory phasicity is normal and symmetric with  the symptomatic side. No evidence of thrombus. Normal compressibility. Common Femoral Vein: Occlusive acute thrombus noted within the common femoral vein with expansion of the vein. Saphenofemoral Junction: Occlusive thrombus noted in the saphenous femoral junction. Profunda Femoral Vein: Occlusive, acute thrombus noted. Femoral Vein: Occlusive acute thrombus noted. Popliteal Vein: Occlusive acute thrombus noted. Calf Veins: Occlusive acute thrombus noted. Superficial Great Saphenous Vein: Occlusive thrombus noted in the great saphenous vein in thigh and calf and short saphenous vein within the calf. Venous Reflux:  N/A Other Findings:  None. IMPRESSION: Extensive acute thrombus throughout the left lower extremity from the common femoral vein through the calf veins. Superficial thrombophlebitis within the greater saphenous and short saphenous veins in the left leg. Critical Value/emergent results were called by telephone at the time of interpretation on 10/15/2017 at 3:51 pm to Dr. Abigail Butts MCNEILL , who verbally acknowledged these results. Electronically Signed   By: Rolm Baptise M.D.   On: 10/15/2017 15:53    Lab Data:  CBC: Recent Labs  Lab 10/15/17 1813 10/16/17 0720 10/17/17 0547 10/18/17 0412  WBC 9.1 6.8 5.4 4.8  NEUTROABS 5.7  --   --   --   HGB 14.1 12.4* 12.3* 11.6*  HCT  40.9 36.2* 35.5* 34.4*  MCV 88.9 89.6 89.4 89.1  PLT 187 168 158 892   Basic Metabolic Panel: Recent Labs  Lab 10/15/17 1813 10/16/17 0944 10/17/17 0547 10/18/17 0412  NA 132* 132* 136 136  K 4.8 5.2* 4.4 4.2  CL 100* 102 104 105  CO2 22 23 24 22   GLUCOSE 127* 205* 132* 220*  BUN 18 21* 20 16  CREATININE 1.51* 1.54* 1.34* 1.20  CALCIUM 9.8 8.9 8.9 8.8*   GFR: Estimated Creatinine Clearance: 83.4 mL/min (by C-G formula based on SCr of 1.2 mg/dL). Liver Function Tests: No results for input(s): AST, ALT, ALKPHOS, BILITOT, PROT, ALBUMIN in the last 168 hours. No results for input(s): LIPASE, AMYLASE in the last 168 hours. No results for input(s): AMMONIA in the last 168 hours. Coagulation Profile: Recent Labs  Lab 10/18/17 0412  INR 0.99   Cardiac Enzymes: No results for input(s): CKTOTAL, CKMB, CKMBINDEX, TROPONINI in the last 168 hours. BNP (last 3 results) No results for input(s): PROBNP in the last 8760 hours. HbA1C: Recent Labs    10/16/17 1251  HGBA1C 7.7*   CBG: Recent Labs  Lab 10/17/17 0743 10/17/17 1209 10/17/17 1659 10/17/17 2201 10/18/17 0730  GLUCAP 138* 178* 162* 192* 166*   Lipid Profile: No results for input(s): CHOL, HDL, LDLCALC, TRIG, CHOLHDL, LDLDIRECT in the last 72 hours. Thyroid Function Tests: No results for input(s): TSH, T4TOTAL, FREET4, T3FREE, THYROIDAB in the last 72 hours. Anemia Panel: No results for input(s): VITAMINB12, FOLATE, FERRITIN, TIBC, IRON, RETICCTPCT in the last 72 hours. Urine analysis: No results found for: COLORURINE, APPEARANCEUR, LABSPEC, PHURINE, GLUCOSEU, HGBUR, BILIRUBINUR, KETONESUR, PROTEINUR, UROBILINOGEN, NITRITE, LEUKOCYTESUR   Zakiah Beckerman M.D. [facility] 10/18/2017, 11:44 AM  Pager: [phone] Between 7am to 7pm - call Pager - [phone]  After 7pm go to [email or web address] - password [email or web address]  Call night coverage person covering after 7pm

## 2017-10-18 NOTE — Op Note (Signed)
    Patient name: Brian Torres MRN: [id number] DOB: [birth year] Sex: male  10/18/2017 Pre-operative Diagnosis: dvt left lower extremity Post-operative diagnosis:  Same Surgeon:  Erlene Quan C. Donzetta Matters, MD Procedure Performed: 1.  US guided access of left popliteal vein 2.  Venogram left lower extremity and ivc 3.  IVUS of left popliteal, femoral, common femoral, external and common iliac veins and ivc 4.  Placement of 50cm treatment length infusion catheter 5.  Moderate sedation with fentanyl and versed for 47 minutes.  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  He is now indicated for venogram with possible intervention.  Findings: Popliteal veins were noncompressible.  Femoral vein distally appeared to be totally occluded with at least some chronic component and there was an [place] of patency mid femoral vein.  There was acute appearing clot more cephalad in the femoral vein and the common femoral vein was completely occluded with acute and chronic appearing thrombus.  The external iliac vein was similarly occluded.  Extending into the common iliac vein there was acute and chronic thrombus that was nonocclusive.  The IVC was patent.  There was some impingement of the right common iliac artery but did not necessarily have May Thurner's syndrome.  At   Procedure:  The patient was identified in the holding area and taken to room 8.  The patient was then placed supine on the table and prepped and draped in the usual sterile fashion.  A time out was called.  Ultrasound was used to evaluate the left small saphenous vein and popliteal veins.  I initially attempted to cannulate the small saphenous but was unable to.  I ultimately after several attempts cannulated the popliteal with a micropuncture wire and needle.  I then placed a micropuncture sheath was able to get a Glidewire track up what was acting like a occluded popliteal into the femoral vein.  We  were then able to exchanged for a 5 French sheath.  Patient was given an additional 6000 units of heparin.  Using bare cath and Glidewire was able to manipulate into the IVC and confirmed intraluminal access.  I then performed I this all the way from the popliteal to the IVC with the above findings.  With this we elected to place a 50 cm treatment length catheter.  After this we confirmed that they were in the IVC down to the femoral vein.  These were fixed in place.  He will be run on lytics overnight.  Patient tolerated procedure well without immediate complication.  Next  Contrast: 20cc   Jashayla Glatfelter C. Donzetta Matters, MD [facility] Office: [phone] Pager: [phone]

## 2017-10-18 NOTE — Progress Notes (Signed)
ANTICOAGULATION CONSULT NOTE - Follow Up Consult  Pharmacy Consult for Heparin Indication: DVT  No Known Allergies  Patient Measurements: Height: 6\' 4"  (193 cm) Weight: 229 lb (103.9 kg) IBW/kg (Calculated) : 86.8   Vital Signs: Temp: 98.4 F (36.9 C) (12/17 0618) BP: 117/69 (12/17 0618) Pulse Rate: 70 (12/17 0618)  Labs: Recent Labs    10/16/17 0720 10/16/17 0944 10/17/17 0547 10/18/17 0412  HGB 12.4*  --  12.3* 11.6*  HCT 36.2*  --  35.5* 34.4*  PLT 168  --  158 179  LABPROT  --   --   --  13.0  INR  --   --   --  0.99  HEPARINUNFRC  --  0.48 0.39 0.44  CREATININE  --  1.54* 1.34* 1.20    Estimated Creatinine Clearance: 83.4 mL/min (by C-G formula based on SCr of 1.2 mg/dL).   Assessment:  Anticoag: L leg DVT (HX dvt 10/2016), Hgb stable. Platelets are normal. Heparin level is 0.44 and remains in goal range. - occlusion of his right common femoral, femoral and popliteal veins  Goal of Therapy:  Heparin level 0.3-0.7 units/ml Monitor platelets by anticoagulation protocol: Yes   Plan:  Continue heparin drip at 1,700 units/hr Monitor daily heparin level, CBC, s/s of bleed Plan venography and thrombolysis today, will f/u after procedure   Renold Genta, PharmD, BCPS Clinical Pharmacist Phone for today - [place] - [phone] 10/18/2017 10:37 AM

## 2017-10-18 NOTE — Progress Notes (Signed)
  Progress Note    10/18/2017 10:50 AM * No surgery date entered *  Subjective:  No acute issues  Vitals:   10/17/17 2208 10/18/17 0618  BP: 138/74 117/69  Pulse: 78 70  Resp: 18 17  Temp: 98.4 F (36.9 C) 98.4 F (36.9 C)  SpO2: 100% 100%    Physical Exam: aaox3 Non labored breathing Left leg with significant edema  CBC    Component Value Date/Time   WBC 4.8 10/18/2017 0412   RBC 3.86 (L) 10/18/2017 0412   HGB 11.6 (L) 10/18/2017 0412   HCT 34.4 (L) 10/18/2017 0412   PLT 179 10/18/2017 0412   MCV 89.1 10/18/2017 0412   MCH 30.1 10/18/2017 0412   MCHC 33.7 10/18/2017 0412   RDW 13.2 10/18/2017 0412   LYMPHSABS 2.2 10/15/2017 1813   MONOABS 0.8 10/15/2017 1813   EOSABS 0.4 10/15/2017 1813   BASOSABS 0.0 10/15/2017 1813    BMET    Component Value Date/Time   NA 136 10/18/2017 0412   K 4.2 10/18/2017 0412   CL 105 10/18/2017 0412   CO2 22 10/18/2017 0412   GLUCOSE 220 (H) 10/18/2017 0412   BUN 16 10/18/2017 0412   CREATININE 1.20 10/18/2017 0412   CALCIUM 8.8 (L) 10/18/2017 0412   GFRNONAA >60 10/18/2017 0412   GFRAA >60 10/18/2017 0412    INR    Component Value Date/Time   INR 0.99 10/18/2017 0412     Intake/Output Summary (Last 24 hours) at 10/18/2017 1050 Last data filed at 10/18/2017 0841 Gross per 24 hour  Intake 342 ml  Output -  Net 342 ml     Assessment:  56 y.o. male is here with extensive left leg dvt Plan: Venogram with possible lysis catheter placement today  Dagan Heinz C. Donzetta Matters, MD [facility] [place] Office: [phone] Pager: [phone]  10/18/2017 10:50 AM

## 2017-10-18 NOTE — Progress Notes (Signed)
Sending patient to cath lab, consent signed, per PA Aaron Edelman stopped hepain drip and kept the NS @ 100 going through RAC. Family at bedside and aware of transfer.

## 2017-10-18 NOTE — Plan of Care (Signed)
  Clinical Measurements: Diagnostic test results will improve 10/18/2017 0020 - Progressing by Fransisco Hertz, RN Respiratory complications will improve 10/18/2017 0020 - Progressing by Fransisco Hertz, RN Cardiovascular complication will be avoided 10/18/2017 0020 - Progressing by Fransisco Hertz, RN

## 2017-10-19 ENCOUNTER — Encounter (HOSPITAL_COMMUNITY): Payer: Self-pay | Admitting: Vascular Surgery

## 2017-10-19 ENCOUNTER — Encounter (HOSPITAL_COMMUNITY)
Admission: EM | Disposition: A | Discharge status: Home / Self Care | Payer: Self-pay | Source: Home / Self Care | Attending: Internal Medicine

## 2017-10-19 DIAGNOSIS — D696 Thrombocytopenia, unspecified: Secondary | ICD-10-CM

## 2017-10-19 DIAGNOSIS — D649 Anemia, unspecified: Secondary | ICD-10-CM

## 2017-10-19 DIAGNOSIS — N289 Disorder of kidney and ureter, unspecified: Secondary | ICD-10-CM

## 2017-10-19 HISTORY — PX: PERIPHERAL VASCULAR BALLOON ANGIOPLASTY: CATH118281

## 2017-10-19 HISTORY — PX: LOWER EXTREMITY ANGIOGRAPHY: CATH118251

## 2017-10-19 LAB — BASIC METABOLIC PANEL
Anion gap: 4 — ABNORMAL LOW (ref 5–15)
BUN: 11 mg/dL (ref 6–20)
CHLORIDE: 108 mmol/L (ref 101–111)
CO2: 23 mmol/L (ref 22–32)
CREATININE: 1.09 mg/dL (ref 0.61–1.24)
Calcium: 8.5 mg/dL — ABNORMAL LOW (ref 8.9–10.3)
GFR calc Af Amer: >60 mL/min (ref >60)
GFR calc non Af Amer: >60 mL/min (ref >60)
GLUCOSE: 154 mg/dL — AB (ref 65–99)
Potassium: 4.5 mmol/L (ref 3.5–5.1)
SODIUM: 135 mmol/L (ref 135–145)

## 2017-10-19 LAB — GLUCOSE, CAPILLARY
GLUCOSE-CAPILLARY: 145 mg/dL — AB (ref 65–99)
GLUCOSE-CAPILLARY: 221 mg/dL — AB (ref 65–99)
GLUCOSE-CAPILLARY: 138 mg/dL — AB (ref 65–99)
GLUCOSE-CAPILLARY: 92 mg/dL (ref 65–99)

## 2017-10-19 LAB — CBC
HCT: 32.2 % — ABNORMAL LOW (ref 39.0–52.0)
Hemoglobin: 11 g/dL — ABNORMAL LOW (ref 13.0–17.0)
MCH: 30.3 pg (ref 26.0–34.0)
MCHC: 34.2 g/dL (ref 30.0–36.0)
MCV: 88.7 fL (ref 78.0–100.0)
Platelets: 122 10*3/uL — ABNORMAL LOW (ref 150–400)
RBC: 3.63 MIL/uL — ABNORMAL LOW (ref 4.22–5.81)
RDW: 13.2 % (ref 11.5–15.5)
WBC: 5 10*3/uL (ref 4.0–10.5)

## 2017-10-19 LAB — HEPARIN LEVEL (UNFRACTIONATED): HEPARIN UNFRACTIONATED: 0.31 [IU]/mL (ref 0.30–0.70)

## 2017-10-19 LAB — FIBRINOGEN: FIBRINOGEN: 88 mg/dL — AB (ref 210–475)

## 2017-10-19 SURGERY — LOWER EXTREMITY ANGIOGRAPHY
Anesthesia: LOCAL

## 2017-10-19 MED ORDER — SODIUM CHLORIDE 0.9 % IV SOLN
INTRAVENOUS | Status: AC
  Administered 2017-10-19: 11:00:00 via INTRAVENOUS

## 2017-10-19 MED ORDER — FENTANYL CITRATE (PF) 100 MCG/2ML IJ SOLN
INTRAMUSCULAR | Status: DC | PRN
  Administered 2017-10-19 (×2): 50 ug via INTRAVENOUS

## 2017-10-19 MED ORDER — ASPIRIN EC 81 MG PO TBEC
81.0000 mg | DELAYED_RELEASE_TABLET | Freq: Every day | ORAL | Status: DC
  Administered 2017-10-19 – 2017-10-20 (×2): 81 mg via ORAL
  Filled 2017-10-19 (×2): qty 1

## 2017-10-19 MED ORDER — LABETALOL HCL 5 MG/ML IV SOLN: 10.0000 mg | INTRAVENOUS | Status: DC | PRN

## 2017-10-19 MED ORDER — LIDOCAINE HCL (PF) 1 % IJ SOLN
INTRAMUSCULAR | Status: DC | PRN
  Administered 2017-10-19: 10 mL

## 2017-10-19 MED ORDER — HYDRALAZINE HCL 20 MG/ML IJ SOLN: 5.0000 mg | INTRAMUSCULAR | Status: DC | PRN

## 2017-10-19 MED ORDER — MIDAZOLAM HCL 2 MG/2ML IJ SOLN
INTRAMUSCULAR | Status: DC | PRN
  Administered 2017-10-19 (×2): 1 mg via INTRAVENOUS

## 2017-10-19 MED ORDER — FENTANYL CITRATE (PF) 100 MCG/2ML IJ SOLN
INTRAMUSCULAR | Status: AC
  Filled 2017-10-19: qty 2

## 2017-10-19 MED ORDER — HEPARIN (PORCINE) IN NACL 100-0.45 UNIT/ML-% IJ SOLN
1900.0000 [IU]/h | INTRAMUSCULAR | Status: DC
  Administered 2017-10-19: 1700 [IU]/h via INTRAVENOUS
  Administered 2017-10-20: 1800 [IU]/h via INTRAVENOUS
  Filled 2017-10-19: qty 250

## 2017-10-19 MED ORDER — HEPARIN SODIUM (PORCINE) 1000 UNIT/ML IJ SOLN
INTRAMUSCULAR | Status: DC | PRN
  Administered 2017-10-19: 5000 [IU] via INTRAVENOUS

## 2017-10-19 MED ORDER — LIDOCAINE HCL (PF) 1 % IJ SOLN
INTRAMUSCULAR | Status: AC
  Filled 2017-10-19: qty 30

## 2017-10-19 MED ORDER — SODIUM CHLORIDE 0.9 % IV SOLN: 250.0000 mL | INTRAVENOUS | Status: DC | PRN

## 2017-10-19 MED ORDER — SODIUM CHLORIDE 0.9% FLUSH
3.0000 mL | Freq: Two times a day (BID) | INTRAVENOUS | Status: DC
  Administered 2017-10-19 – 2017-10-20 (×2): 3 mL via INTRAVENOUS

## 2017-10-19 MED ORDER — ALUM & MAG HYDROXIDE-SIMETH 200-200-20 MG/5ML PO SUSP
30.0000 mL | Freq: Four times a day (QID) | ORAL | Status: DC | PRN
  Administered 2017-10-19: 30 mL via ORAL
  Filled 2017-10-19: qty 30

## 2017-10-19 MED ORDER — SODIUM CHLORIDE 0.9% FLUSH: 3.0000 mL | INTRAVENOUS | Status: DC | PRN

## 2017-10-19 MED ORDER — HEPARIN (PORCINE) IN NACL 2-0.9 UNIT/ML-% IJ SOLN
INTRAMUSCULAR | Status: AC
  Filled 2017-10-19: qty 500

## 2017-10-19 MED ORDER — IODIXANOL 320 MG/ML IV SOLN
INTRAVENOUS | Status: DC | PRN
  Administered 2017-10-19: 5 mL via INTRAVENOUS

## 2017-10-19 MED ORDER — MIDAZOLAM HCL 2 MG/2ML IJ SOLN
INTRAMUSCULAR | Status: AC
  Filled 2017-10-19: qty 2

## 2017-10-19 MED ORDER — ACETAMINOPHEN 325 MG PO TABS: 650.0000 mg | ORAL_TABLET | ORAL | Status: DC | PRN

## 2017-10-19 MED ORDER — ONDANSETRON HCL 4 MG/2ML IJ SOLN: 4.0000 mg | Freq: Four times a day (QID) | INTRAMUSCULAR | Status: DC | PRN

## 2017-10-19 MED ORDER — HEPARIN (PORCINE) IN NACL 2-0.9 UNIT/ML-% IJ SOLN
INTRAMUSCULAR | Status: DC | PRN
  Administered 2017-10-19: 1000 mL

## 2017-10-19 SURGICAL SUPPLY — 14 items
BALLN ATLAS 16X40X75 (BALLOONS) ×3
BALLN MUSTANG 12X80X75 (BALLOONS) ×3
BALLOON ATLAS 16X40X75 (BALLOONS) IMPLANT
BALLOON MUSTANG 12X80X75 (BALLOONS) IMPLANT
CATH ANGIO 5F BER2 65CM (CATHETERS) ×1 IMPLANT
CATH VISIONS PV .035 IVUS (CATHETERS) ×1 IMPLANT
COVER PRB 48X5XTLSCP FOLD TPE (BAG) IMPLANT
COVER PROBE 5X48 (BAG) ×3
KIT ENCORE 26 ADVANTAGE (KITS) ×1 IMPLANT
PROTECTION STATION PRESSURIZED (MISCELLANEOUS) ×3
SET ZELANTE DVT THROMB (CATHETERS) ×1 IMPLANT
STATION PROTECTION PRESSURIZED (MISCELLANEOUS) IMPLANT
TRAY PV CATH (CUSTOM PROCEDURE TRAY) ×3 IMPLANT
WIRE AMPLATZ SS-J .035X260CM (WIRE) ×1 IMPLANT

## 2017-10-19 NOTE — Op Note (Signed)
    Patient name: Brian Torres MRN: [id number] DOB: 04-30-1961 Sex: male  10/19/2017 Pre-operative Diagnosis: extensive left lower extremity dvt Post-operative diagnosis:  Same Surgeon:  Eda Paschal. Donzetta Matters, MD Procedure Performed: 1.  IVUS of left popliteal, femoral, common femoral, external and common iliac veins and ivc 2.  Balloon angioplasty of left femoral, common femoral, external and common iliac veins and ivc with 12 and 28mm balloons 3.  Percutaneous mechanical thrombectomy with angiojet for 98 seconds of left femoral, common femoral veins 4.  Left lower extremity venogram 5.  Moderate sedation with fentanyl and versed for 51 minutes  Indications: 56 year old male with extensive left lower extremity DVT has recently undergone placement of a lytic catheter.  He is now indicated for lytic recheck and possible intervention.  Findings: Much of the acute clot in the left lower extremity had resolved.  There was chronic appearing clot in the femoral cephalad and the common femoral veins.  There do not appear to be any impingement at the IVC confluence.  After AngioJet balloon angioplasty there is a channel at the common femoral vein but remains with significant chronic thrombus.  I elected not to stent here given that there was no collateral flow and also that there is a channel and would have to be only a common femoral stent as the rest is free of disease.  If he continues to have swelling and lower extremity symptoms he will need to be considered for common femoral vein stenting on the left.   Procedure:  The patient was identified in the holding area and taken to room 8.  He was placed prone on the operating table sterilely prepped and draped in his previous access site.  He was given fentanyl and Versed for moderate sedation.  He was also given an additional 5000 units of heparin.  We then wired our existing catheter with an Amplatz wire placed into the cephalad IVC.  We performed obvious  all the way of his entire left lower extremity up to his IVC with the above findings.  We then performed AngioJet initially for 60 seconds of the common femoral and cephalad femoral veins.  Balloon angioplasty was performed of this area as well as the iliacs up into the IVC which did not demonstrate any impingement from the right common femoral artery.  We then performed office again which demonstrated subtotal occlusion of the common femoral and cephalad femoral veins.  AngioJet was again performed up to a total of 98 seconds.  Balloon angioplasty with 16 mm balloon from the cephalad femoral all the way through the IVC was performed.  We then reperformed diabetes and demonstrated a small channel through the common femoral vein.  We then placed a catheter into the cephalad femoral vein performed venogram which did not demonstrate any collaterals and there is a flow channel through the common femoral vein.  With this we will maintain the patient on anticoagulation.  The wire and catheter were removed the sheath was pulled and pressure held.  Hemostasis obtained.  Patient tolerated procedure well without immediate complication.   Contrast: 5cc  [name] Donzetta Matters, MD [facility] [place] Office: [phone] Pager: [phone]

## 2017-10-19 NOTE — Progress Notes (Signed)
Patient received as a transfer from [facility].  Patient oriented to room, vital signs stable, bed in lowest position, and call bell within reach.

## 2017-10-19 NOTE — Progress Notes (Signed)
CRITICAL VALUE ALERT  Critical Value:  Fibrinogen 88  Date & Time Notied:  10/19/2017 07:38  Provider Notified: Dr. Donzetta Matters  Orders Received/Actions taken: No new orders at this time.   Lucius Conn, RN

## 2017-10-19 NOTE — Progress Notes (Signed)
PROGRESS NOTE    Brian Torres  DXA:[id number] DOB: 05-Apr-1961 DOA: 10/15/2017 PCP: Cari Caraway, MD   Brief Narrative:  Brian Bingman Allenis a 56 y.o.malewith medical history significant ofprior LLE DVT earlier this year was on 7 months of Xarelto, DM2, HTN, CKD stage 3. Patient presented to the ED with L leg pain and swelling. Onset earlier on the day of admission and he went to PCP. He was found to have a large occlusive L leg DVT. Vascular surgeru Dr. Donzetta Matters was contacted and had patient sent to ED for heparin gtt, admission, and lysis. He underwent venogram and IVUS of Left Popliteal, Femoral, Common Femoral, and External and Common iliac veins and IVC on 10/18/17 and on 12/18 underwent balloon angioplasty and percutaneous mechanical thrombectomy.  Assessment & Plan:   Principal Problem:   Leg DVT (deep venous thromboembolism), acute, left (HCC) Active Problems:   DM2 (diabetes mellitus, type 2) (HCC)   HTN (hypertension)   CKD stage 3 due to type 2 diabetes mellitus ([place])  Leg DVT (deep venous thromboembolism), acute, left ([place]) -Patient was initially found to have acute left DVT in 12/17, finished 7 months of Xarelto - Now presented again with left leg pain and swelling, Doppler ultrasound of the left lower extremity showed extensive acute thrombus throughout the left lower extremity from the common femoral vein through the calf veins with superficial thrombophlebitis and greater saphenous and short saphenous veins. - Unclear cause of the first DVT, Hypercoagulable syndrome (?), discussed with the patient, could not find any precipitating factors.  Cannot find any records in epic about previous DVT. -Needs to have hypercoagulable workup outpatient and will need Hematology referral  -Dr. Tana Coast recommended VQ scan, if higher probability of PE, will need prolonged or lifelong anticoagulation.  Patient is however having no symptoms at this time of chest pain or shortness of breath/DOE, he  wants to hold off on the imaging until vascular surgery recommends it.  No CTA chest due to renal insufficiency -Patient underwent Venogram and placement of Lytic Cathether along with Thrombolysis and Thrombectomy along with balloon angioplasty (12/17 and 12/18) -Continue IV heparin drip for now with Pharmacy to Dose.   -Patient will benefit from changing the anticoagulation to Eliquis upon discharge given recurrent DVT -Continue to Monitor for S/Sx of Bleeding -Fibrinogen Level decreased from 422 -> 104 -> 88 -Repeat CBC in AM   DM2 (diabetes mellitus, type 2) (HCC) -Continue Glimepiride, Actos, patient wants to continue his home medications -Metformin held due to acute renal insufficiency and need for contrast agents.   -HbA1c was 7.7 -Continue Sensitive Novolog SSI AC -CBG's ranging from 138-221  HTN (Hypertension) -BP currently stable -C/w Metoprolol 5 mg IV q6hprn, Labetalol 10 mg IV q10 mg prn HBP x4 doses, and Hydralazine 5 mg IV q20 mg prn HBP post Cath  Acute on CKD stage 3 due to Type 2 Diabetes Mellitus (HCC) -Creatinine improving, 1.5 on admission, improved to 1.09 -Continue to Monitor in AM by repeating BMP  Normocytic Anemia -Hb/Hct dropped from 11.8/34.2 -> 11.0/32.2 -Continue to Monitor for S/Sx of Bleeding -Repeat CBC in AM   Thrombocytopenia -Likely Reactive but if continues to drop will need to r/o HIT -Continue to Monitor while patient is Anticoagulated with Heparin gtt -Platelet Count went from 180 -> 124 -> 122 -Repeat CBC in AM   DVT prophylaxis: Anticoagulated with Heparin gtt Code Status: FULL CODE Family Communication:  Disposition Plan: Pending Vascular Clearance; Stable to transfer to Medical Floor  Consultants:   Vascular Surgery Dr. Donzetta Matters  Procedures:  10/18/17 1.  US guided access of left popliteal vein 2.  Venogram left lower extremity and ivc 3.  IVUS of left popliteal, femoral, common femoral, external and common iliac veins and  ivc 4.  Placement of 50cm treatment length infusion catheter  10/19/17 1.  IVUS of left popliteal, femoral, common femoral, external and common iliac veins and ivc 2.  Balloon angioplasty of left femoral, common femoral, external and common iliac veins and ivc with 12 and 60mm balloons 3.  Percutaneous mechanical thrombectomy with angiojet for 98 seconds of left femoral, common femoral veins 4.  Left lower extremity venogram 5.  Moderate sedation with fentanyl and versed for 51 minutes   Antimicrobials:  Anti-infectives (From admission, onward)   Start     Dose/Rate Route Frequency Ordered Stop   10/18/17 1500  ceFAZolin (ANCEF) IVPB 2g/100 mL premix     2 g 200 mL/hr over 30 Minutes Intravenous Every 8 hours 10/18/17 1421     10/18/17 0600  ceFAZolin (ANCEF) IVPB 1 g/50 mL premix  Status:  Discontinued     1 g 100 mL/hr over 30 Minutes Intravenous On call to O.R. 10/17/17 1115 10/17/17 1156   10/18/17 0600  ceFAZolin (ANCEF) IVPB 2g/100 mL premix  Status:  Discontinued     2 g 200 mL/hr over 30 Minutes Intravenous On call to O.R. 10/17/17 1156 10/18/17 1633     Subjective: Seen and examined after procedure and had no complaints. Denied any pain in leg. No CP or SOB.   Objective: Vitals:   10/19/17 0400 10/19/17 0500 10/19/17 0600 10/19/17 0700  BP: 128/75 130/80 (!) 141/77 125/72  Pulse: 65 65 66 61  Resp: 11 12 20 14   Temp: 98.4 F (36.9 C)     TempSrc:      SpO2: 98% 97% 97% 98%  Weight:      Height:        Intake/Output Summary (Last 24 hours) at 10/19/2017 0752 Last data filed at 10/19/2017 0700 Gross per 24 hour  Intake 3033.84 ml  Output 1850 ml  Net 1183.84 ml   Filed Weights   10/15/17 1845 10/15/17 2132  Weight: 102.1 kg (225 lb) 103.9 kg (229 lb)   Examination: Physical Exam:  Constitutional: WN/WD Caucasian male in NAD and appears calm  Eyes: Lids and conjunctivae normal, sclerae anicteric  ENMT: External Ears, Nose appear normal. Grossly normal  hearing. Mucous membranes are moist.  Neck: Appears normal, supple, no cervical masses, normal ROM, no appreciable thyromegaly, no JVD Respiratory: Clear to auscultation bilaterally, no wheezing, rales, rhonchi or crackles. Normal respiratory effort and patient is not tachypenic. No accessory muscle use.  Cardiovascular: RRR, no murmurs / rubs / gallops. S1 and S2 auscultated. No extremity edema. Abdomen: Soft, non-tender, non-distended. No masses palpated. No appreciable hepatosplenomegaly. Bowel sounds positive x4.  GU: Deferred. Musculoskeletal: No clubbing / cyanosis of digits/nails. No joint deformity upper and lower extremities. Skin: No rashes, lesions, ulcers. Left Leg incision appeared C/D/I. No induration; Warm and dry.  Neurologic: CN 2-12 grossly intact with no focal deficits. Sensation intact in all 4 Extremities. Romberg sign and cerebellar reflexes not assessed.  Psychiatric: Normal judgment and insight. Alert and oriented x 3. Normal mood and appropriate affect.   Data Reviewed: I have personally reviewed following labs and imaging studies  CBC: Recent Labs  Lab 10/15/17 1813  10/17/17 0547 10/18/17 0412 10/18/17 1648 10/18/17 2230 10/19/17 0559  WBC  9.1   < > 5.4 4.8 4.7 6.4 5.0  NEUTROABS 5.7  --   --   --   --   --   --   HGB 14.1   < > 12.3* 11.6* 11.8* 11.0* 11.0*  HCT 40.9   < > 35.5* 34.4* 34.2* 32.4* 32.2*  MCV 88.9   < > 89.4 89.1 89.1 89.0 88.7  PLT 187   < > 158 179 180 124* 122*   < > = values in this interval not displayed.   Basic Metabolic Panel: Recent Labs  Lab 10/15/17 1813 10/16/17 0944 10/17/17 0547 10/18/17 0412 10/19/17 0559  NA 132* 132* 136 136 135  K 4.8 5.2* 4.4 4.2 4.5  CL 100* 102 104 105 108  CO2 22 23 24 22 23   GLUCOSE 127* 205* 132* 220* 154*  BUN 18 21* 20 16 11   CREATININE 1.51* 1.54* 1.34* 1.20 1.09  CALCIUM 9.8 8.9 8.9 8.8* 8.5*   GFR: Estimated Creatinine Clearance: 91.8 mL/min (by C-G formula based on SCr of 1.09  mg/dL). Liver Function Tests: No results for input(s): AST, ALT, ALKPHOS, BILITOT, PROT, ALBUMIN in the last 168 hours. No results for input(s): LIPASE, AMYLASE in the last 168 hours. No results for input(s): AMMONIA in the last 168 hours. Coagulation Profile: Recent Labs  Lab 10/18/17 0412  INR 0.99   Cardiac Enzymes: No results for input(s): CKTOTAL, CKMB, CKMBINDEX, TROPONINI in the last 168 hours. BNP (last 3 results) No results for input(s): PROBNP in the last 8760 hours. HbA1C: Recent Labs    10/16/17 1251  HGBA1C 7.7*   CBG: Recent Labs  Lab 10/17/17 2201 10/18/17 0730 10/18/17 1227 10/18/17 1646 10/18/17 2140  GLUCAP 192* 166* 147* 111* 135*   Lipid Profile: No results for input(s): CHOL, HDL, LDLCALC, TRIG, CHOLHDL, LDLDIRECT in the last 72 hours. Thyroid Function Tests: No results for input(s): TSH, T4TOTAL, FREET4, T3FREE, THYROIDAB in the last 72 hours. Anemia Panel: No results for input(s): VITAMINB12, FOLATE, FERRITIN, TIBC, IRON, RETICCTPCT in the last 72 hours. Sepsis Labs: No results for input(s): PROCALCITON, LATICACIDVEN in the last 168 hours.  Recent Results (from the past 240 hour(s))  MRSA PCR Screening     Status: None   Collection Time: 10/18/17  4:37 PM  Result Value Ref Range Status   MRSA by PCR NEGATIVE NEGATIVE Final    Comment:        The GeneXpert MRSA Assay (FDA approved for NASAL specimens only), is one component of a comprehensive MRSA colonization surveillance program. It is not intended to diagnose MRSA infection nor to guide or monitor treatment for MRSA infections.     Radiology Studies: No results found.  Scheduled Meds: . B-complex with vitamin C  1 tablet Oral Q24H  . fenofibrate  54 mg Oral Q24H  . glimepiride  4 mg Oral Q breakfast  . insulin aspart  0-9 Units Subcutaneous TID WC  . metoprolol tartrate  5 mg Intravenous Q6H  . multivitamin with minerals  1 tablet Oral Q24H  . pioglitazone  45 mg Oral Q  breakfast  . sodium chloride flush  3 mL Intravenous Q12H   Continuous Infusions: . sodium chloride 100 mL/hr at 10/19/17 0700  . sodium chloride    . sodium chloride Stopped (10/18/17 2000)  .  ceFAZolin (ANCEF) IV Stopped (10/19/17 0549)  . heparin 1,700 Units/hr (10/19/17 0700)     LOS: 3 days   Brian Elbe, DO [facility] Pager [phone]  If 7PM-7AM, please contact night-coverage [email or web address] Password [email or web address] 10/19/2017, 7:52 AM

## 2017-10-19 NOTE — Care Management Note (Signed)
Case Management Note  Patient Details  Name: Brian Torres MRN: [id number] Date of Birth: Mar 15, 1961  Subjective/Objective:  From home with spouse, presents with extensive LE DVT, s/p placement of LE lytic catheter, conts on heparin drip.                   Action/Plan: NCM will follow for dc needs.   Expected Discharge Date:                  Expected Discharge Plan:     In-House Referral:     Discharge planning Services  CM Consult  Post Acute Care Choice:    Choice offered to:     DME Arranged:    DME Agency:     HH Arranged:    HH Agency:     Status of Service:  In process, will continue to follow  If discussed at Long Length of Stay Meetings, dates discussed:    Additional Comments:  Zenon Mayo, RN 10/19/2017, 10:46 AM

## 2017-10-19 NOTE — Progress Notes (Signed)
ANTICOAGULATION CONSULT NOTE - Initial Consult  Pharmacy Consult for heparin Indication: DVT  No Known Allergies  Patient Measurements: Height: 6\' 4"  (193 cm) Weight: 229 lb (103.9 kg) IBW/kg (Calculated) : 86.8  Vital Signs: Temp: 98.4 F (36.9 C) (12/18 0400) Temp Source: Oral (12/18 0000) BP: 130/80 (12/18 0500) Pulse Rate: 65 (12/18 0500)  Labs: Recent Labs    10/16/17 0944 10/17/17 0547 10/18/17 0412 10/18/17 1648 10/18/17 2230  HGB  --  12.3* 11.6* 11.8* 11.0*  HCT  --  35.5* 34.4* 34.2* 32.4*  PLT  --  158 179 180 124*  LABPROT  --   --  13.0  --   --   INR  --   --  0.99  --   --   HEPARINUNFRC 0.48 0.39 0.44 0.62 <0.10*  CREATININE 1.54* 1.34* 1.20  --   --     Estimated Creatinine Clearance: 83.4 mL/min (by C-G formula based on SCr of 1.2 mg/dL).   Medical History: Past Medical History:  Diagnosis Date  . Depression   . Diabetes mellitus without complication ([place])   . Dyslipidemia   . Erectile dysfunction   . Nicotine addiction     Medications:  Medications Prior to Admission  Medication Sig Dispense Refill Last Dose  . b complex vitamins tablet Take 1 tablet by mouth daily.   10/15/2017 at Unknown time  . CINNAMON PO Take 1,000 mg by mouth 2 (two) times daily.   10/15/2017 at Unknown time  . fenofibrate (TRICOR) 48 MG tablet Take 48 mg by mouth daily.   10/15/2017 at Unknown time  . glimepiride (AMARYL) 4 MG tablet Take 4 mg by mouth daily.   10/15/2017 at Unknown time  . Krill Oil 350 MG CAPS Take 350 mg by mouth 2 (two) times daily.   10/15/2017 at Unknown time  . lisinopril (PRINIVIL,ZESTRIL) 10 MG tablet Take 10 mg by mouth daily.   10/15/2017 at Unknown time  . metFORMIN (GLUCOPHAGE) 1000 MG tablet Take 1,000 mg by mouth 2 (two) times daily.   10/15/2017 at am  . pioglitazone (ACTOS) 45 MG tablet Take 45 mg by mouth daily.   10/15/2017 at Unknown time   Scheduled:  . B-complex with vitamin C  1 tablet Oral Q24H  . fenofibrate  54 mg Oral  Q24H  . glimepiride  4 mg Oral Q breakfast  . insulin aspart  0-9 Units Subcutaneous TID WC  . metoprolol tartrate  5 mg Intravenous Q6H  . multivitamin with minerals  1 tablet Oral Q24H  . pioglitazone  45 mg Oral Q breakfast  . sodium chloride flush  3 mL Intravenous Q12H   Infusions:  . sodium chloride 100 mL/hr at 10/19/17 0500  . sodium chloride    . sodium chloride Stopped (10/18/17 2000)  .  ceFAZolin (ANCEF) IV 2 g (10/19/17 0519)  . heparin 500 Units/hr (10/19/17 0500)    Assessment: 56yo male started on heparin for DVT and went to IR for catheter-directed tPA with low-dose heparin infusion, now to stop tPA and resume full-dose heparin; RN reports some oozing at vascular site but no signs of frank bleeding.  Goal of Therapy:  Heparin level 0.3-0.7 units/ml Monitor platelets by anticoagulation protocol: Yes   Plan:  Will resume previously therapeutic heparin rate of 1700 units/hr and monitor heparin levels and CBC.  Wynona Neat, PharmD, BCPS  10/19/2017,6:04 AM

## 2017-10-19 NOTE — Progress Notes (Signed)
  Progress Note    10/19/2017 6:39 AM 1 Day Post-Op  Subjective: no overnight events  Vitals:   10/19/17 0400 10/19/17 0500  BP: 128/75 130/80  Pulse: 65 65  Resp: 11 12  Temp: 98.4 F (36.9 C)   SpO2: 98% 97%    Physical Exam: aaox3 Neuro in tact Left leg edema significantly improved  CBC    Component Value Date/Time   WBC 6.4 10/18/2017 2230   RBC 3.64 (L) 10/18/2017 2230   HGB 11.0 (L) 10/18/2017 2230   HCT 32.4 (L) 10/18/2017 2230   PLT 124 (L) 10/18/2017 2230   MCV 89.0 10/18/2017 2230   MCH 30.2 10/18/2017 2230   MCHC 34.0 10/18/2017 2230   RDW 13.2 10/18/2017 2230   LYMPHSABS 2.2 10/15/2017 1813   MONOABS 0.8 10/15/2017 1813   EOSABS 0.4 10/15/2017 1813   BASOSABS 0.0 10/15/2017 1813    BMET    Component Value Date/Time   NA 136 10/18/2017 0412   K 4.2 10/18/2017 0412   CL 105 10/18/2017 0412   CO2 22 10/18/2017 0412   GLUCOSE 220 (H) 10/18/2017 0412   BUN 16 10/18/2017 0412   CREATININE 1.20 10/18/2017 0412   CALCIUM 8.8 (L) 10/18/2017 0412   GFRNONAA >60 10/18/2017 0412   GFRAA >60 10/18/2017 0412    INR    Component Value Date/Time   INR 0.99 10/18/2017 0412     Intake/Output Summary (Last 24 hours) at 10/19/2017 0639 Last data filed at 10/19/2017 0600 Gross per 24 hour  Intake 2922.84 ml  Output 1850 ml  Net 1072.84 ml     Assessment:  56 y.o. male is s/p placement of left lower extremity lytic catheter  Plan: Lysis recheck today tpa stopped, systemic heparin ordered 10cc ivf through catheter, 500 units heparin through sheath, systemic heparin    Reagen Haberman C. Donzetta Matters, MD [facility] [place] Office: [phone] Pager: [phone]  10/19/2017 6:39 AM

## 2017-10-19 NOTE — Progress Notes (Signed)
ANTICOAGULATION CONSULT NOTE - Follow Up Consult  Pharmacy Consult for heparin Indication: DVT  No Known Allergies  Patient Measurements: Height: 6\' 4"  (193 cm) Weight: 229 lb (103.9 kg) IBW/kg (Calculated) : 86.8  Vital Signs: Temp: 98.3 F (36.8 C) (12/18 1938) Temp Source: Oral (12/18 1938) BP: 131/81 (12/18 1938) Pulse Rate: 64 (12/18 1938)  Labs: Recent Labs    10/17/17 0547 10/18/17 0412 10/18/17 1648 10/18/17 2230 10/19/17 0559 10/19/17 1933  HGB 12.3* 11.6* 11.8* 11.0* 11.0*  --   HCT 35.5* 34.4* 34.2* 32.4* 32.2*  --   PLT 158 179 180 124* 122*  --   LABPROT  --  13.0  --   --   --   --   INR  --  0.99  --   --   --   --   HEPARINUNFRC 0.39 0.44 0.62 <0.10*  --  0.31  CREATININE 1.34* 1.20  --   --  1.09  --     Estimated Creatinine Clearance: 91.8 mL/min (by C-G formula based on SCr of 1.09 mg/dL).   Medical History: Past Medical History:  Diagnosis Date  . Depression   . Diabetes mellitus without complication ([place])   . Dyslipidemia   . Erectile dysfunction   . Nicotine addiction     Medications:  Medications Prior to Admission  Medication Sig Dispense Refill Last Dose  . b complex vitamins tablet Take 1 tablet by mouth daily.   10/15/2017 at Unknown time  . CINNAMON PO Take 1,000 mg by mouth 2 (two) times daily.   10/15/2017 at Unknown time  . fenofibrate (TRICOR) 48 MG tablet Take 48 mg by mouth daily.   10/15/2017 at Unknown time  . glimepiride (AMARYL) 4 MG tablet Take 4 mg by mouth daily.   10/15/2017 at Unknown time  . Krill Oil 350 MG CAPS Take 350 mg by mouth 2 (two) times daily.   10/15/2017 at Unknown time  . lisinopril (PRINIVIL,ZESTRIL) 10 MG tablet Take 10 mg by mouth daily.   10/15/2017 at Unknown time  . metFORMIN (GLUCOPHAGE) 1000 MG tablet Take 1,000 mg by mouth 2 (two) times daily.   10/15/2017 at am  . pioglitazone (ACTOS) 45 MG tablet Take 45 mg by mouth daily.   10/15/2017 at Unknown time   Assessment: 56yo male started on  heparin for DVT and went to IR for catheter-directed tPA with low-dose heparin infusion, now to stop tPA and resume full-dose heparin. During procedure this morning it was found that much of the acute clot in the L LE has resolved. Notes indicate that there remains significant chronic thrombus in the CFV.  Heparin level this evening is at low end of goal at 0.3. No bleeding issues noted, will increase rate to keep within range.   Goal of Therapy:  Heparin level 0.3-0.7 units/ml Monitor platelets by anticoagulation protocol: Yes   Plan:  Will increase rate to 1800 Monitor heparin levels and CBC daily  Erin Hearing PharmD., BCPS Clinical Pharmacist 10/19/2017 8:41 PM

## 2017-10-19 NOTE — Progress Notes (Signed)
ANTICOAGULATION CONSULT NOTE - Follow Up Consult  Pharmacy Consult for heparin Indication: DVT  No Known Allergies  Patient Measurements: Height: 6\' 4"  (193 cm) Weight: 229 lb (103.9 kg) IBW/kg (Calculated) : 86.8  Vital Signs: Temp: 98.7 F (37.1 C) (12/18 0700) Temp Source: Oral (12/18 0700) BP: 141/76 (12/18 0830) Pulse Rate: 65 (12/18 0830)  Labs: Recent Labs    10/17/17 0547 10/18/17 0412 10/18/17 1648 10/18/17 2230 10/19/17 0559  HGB 12.3* 11.6* 11.8* 11.0* 11.0*  HCT 35.5* 34.4* 34.2* 32.4* 32.2*  PLT 158 179 180 124* 122*  LABPROT  --  13.0  --   --   --   INR  --  0.99  --   --   --   HEPARINUNFRC 0.39 0.44 0.62 <0.10*  --   CREATININE 1.34* 1.20  --   --  1.09    Estimated Creatinine Clearance: 91.8 mL/min (by C-G formula based on SCr of 1.09 mg/dL).   Medical History: Past Medical History:  Diagnosis Date  . Depression   . Diabetes mellitus without complication ([place])   . Dyslipidemia   . Erectile dysfunction   . Nicotine addiction     Medications:  Medications Prior to Admission  Medication Sig Dispense Refill Last Dose  . b complex vitamins tablet Take 1 tablet by mouth daily.   10/15/2017 at Unknown time  . CINNAMON PO Take 1,000 mg by mouth 2 (two) times daily.   10/15/2017 at Unknown time  . fenofibrate (TRICOR) 48 MG tablet Take 48 mg by mouth daily.   10/15/2017 at Unknown time  . glimepiride (AMARYL) 4 MG tablet Take 4 mg by mouth daily.   10/15/2017 at Unknown time  . Krill Oil 350 MG CAPS Take 350 mg by mouth 2 (two) times daily.   10/15/2017 at Unknown time  . lisinopril (PRINIVIL,ZESTRIL) 10 MG tablet Take 10 mg by mouth daily.   10/15/2017 at Unknown time  . metFORMIN (GLUCOPHAGE) 1000 MG tablet Take 1,000 mg by mouth 2 (two) times daily.   10/15/2017 at am  . pioglitazone (ACTOS) 45 MG tablet Take 45 mg by mouth daily.   10/15/2017 at Unknown time   Scheduled:  . aspirin EC  81 mg Oral Daily  . B-complex with vitamin C  1 tablet  Oral Q24H  . fenofibrate  54 mg Oral Q24H  . glimepiride  4 mg Oral Q breakfast  . insulin aspart  0-9 Units Subcutaneous TID WC  . metoprolol tartrate  5 mg Intravenous Q6H  . multivitamin with minerals  1 tablet Oral Q24H  . pioglitazone  45 mg Oral Q breakfast  . sodium chloride flush  3 mL Intravenous Q12H   Infusions:  . sodium chloride Stopped (10/18/17 2000)  . sodium chloride    . sodium chloride      Assessment: 56yo male started on heparin for DVT and went to IR for catheter-directed tPA with low-dose heparin infusion, now to stop tPA and resume full-dose heparin. During procedure this morning it was found that much of the acute clot in the L LE has resolved. Notes indicate that there remains significant chronic thrombus in the CFV.  Plan to resume heparin 2 hours after sheath removal.   Hgb remain stable ~11 today. Platelets dropped slightly to 122. Fibrinogen also decreased from 104 to 88 this morning. Overnight nursing reported some oozing at vascular site but no signs of frank bleeding - has since resolved. Sheath was removed at 0951 today.  Goal of Therapy:  Heparin level 0.3-0.7 units/ml Monitor platelets by anticoagulation protocol: Yes   Plan:  Will resume previously therapeutic heparin rate of 1700 units/hr at 1200 Will order 6 hour level Monitor heparin levels and CBC.  Doylene Canard, PharmD Clinical Pharmacist  Pager: [phone] Clinical Phone for 10/19/2017 until 3:30pm: [phone] If after 3:30pm, please call main pharmacy at [phone]  10/19/2017,10:31 AM

## 2017-10-20 ENCOUNTER — Telehealth: Payer: Self-pay | Admitting: Vascular Surgery

## 2017-10-20 LAB — CBC
HCT: 30.4 % — ABNORMAL LOW (ref 39.0–52.0)
HEMOGLOBIN: 10.4 g/dL — AB (ref 13.0–17.0)
MCH: 30.4 pg (ref 26.0–34.0)
MCHC: 34.2 g/dL (ref 30.0–36.0)
MCV: 88.9 fL (ref 78.0–100.0)
Platelets: 124 10*3/uL — ABNORMAL LOW (ref 150–400)
RBC: 3.42 MIL/uL — AB (ref 4.22–5.81)
RDW: 13.1 % (ref 11.5–15.5)
WBC: 4.8 10*3/uL (ref 4.0–10.5)

## 2017-10-20 LAB — GLUCOSE, CAPILLARY: GLUCOSE-CAPILLARY: 163 mg/dL — AB (ref 65–99)

## 2017-10-20 LAB — HEPATIC FUNCTION PANEL
ALBUMIN: 3.1 g/dL — AB (ref 3.5–5.0)
ALK PHOS: 62 U/L (ref 38–126)
ALT: 27 U/L (ref 17–63)
AST: 35 U/L (ref 15–41)
BILIRUBIN TOTAL: 0.5 mg/dL (ref 0.3–1.2)
Bilirubin, Direct: <0.1 mg/dL — ABNORMAL LOW (ref 0.1–0.5)
TOTAL PROTEIN: 5.6 g/dL — AB (ref 6.5–8.1)

## 2017-10-20 LAB — BASIC METABOLIC PANEL
Anion gap: 7 (ref 5–15)
BUN: 12 mg/dL (ref 6–20)
CHLORIDE: 107 mmol/L (ref 101–111)
CO2: 23 mmol/L (ref 22–32)
CREATININE: 1.15 mg/dL (ref 0.61–1.24)
Calcium: 8.4 mg/dL — ABNORMAL LOW (ref 8.9–10.3)
GFR calc Af Amer: >60 mL/min (ref >60)
GFR calc non Af Amer: >60 mL/min (ref >60)
GLUCOSE: 200 mg/dL — AB (ref 65–99)
Potassium: 4.3 mmol/L (ref 3.5–5.1)
SODIUM: 137 mmol/L (ref 135–145)

## 2017-10-20 LAB — HEPARIN LEVEL (UNFRACTIONATED)
HEPARIN UNFRACTIONATED: 0.29 [IU]/mL — AB (ref 0.30–0.70)
HEPARIN UNFRACTIONATED: 0.49 [IU]/mL (ref 0.30–0.70)

## 2017-10-20 LAB — MAGNESIUM: Magnesium: 1.8 mg/dL (ref 1.7–2.4)

## 2017-10-20 LAB — PHOSPHORUS: Phosphorus: 3.1 mg/dL (ref 2.5–4.6)

## 2017-10-20 MED ORDER — RIVAROXABAN (XARELTO) VTE STARTER PACK (15 & 20 MG): ORAL_TABLET | ORAL | 0 refills | Status: DC

## 2017-10-20 MED ORDER — RIVAROXABAN 20 MG PO TABS: 20.0000 mg | ORAL_TABLET | Freq: Every day | ORAL | 6 refills | Status: DC

## 2017-10-20 MED ORDER — RIVAROXABAN 20 MG PO TABS: 20.0000 mg | ORAL_TABLET | Freq: Every day | ORAL | Status: DC

## 2017-10-20 MED ORDER — RIVAROXABAN 15 MG PO TABS
15.0000 mg | ORAL_TABLET | Freq: Two times a day (BID) | ORAL | Status: DC
  Administered 2017-10-20: 15 mg via ORAL
  Filled 2017-10-20: qty 1

## 2017-10-20 MED ORDER — RIVAROXABAN (XARELTO) EDUCATION KIT FOR DVT/PE PATIENTS
PACK | Freq: Once | Status: AC
  Administered 2017-10-20: 11:00:00
  Filled 2017-10-20: qty 1

## 2017-10-20 NOTE — Progress Notes (Signed)
ANTICOAGULATION CONSULT NOTE - Follow Up Consult  Pharmacy Consult for heparin Indication: DVT  Labs: Recent Labs    10/17/17 0547 10/18/17 0412  10/18/17 2230 10/19/17 0559 10/19/17 1933 10/20/17 0245 10/20/17 0246  HGB 12.3* 11.6*   < > 11.0* 11.0*  --   --  10.4*  HCT 35.5* 34.4*   < > 32.4* 32.2*  --   --  30.4*  PLT 158 179   < > 124* 122*  --   --  124*  LABPROT  --  13.0  --   --   --   --   --   --   INR  --  0.99  --   --   --   --   --   --   HEPARINUNFRC 0.39 0.44   < > <0.10*  --  0.31 0.29*  --   CREATININE 1.34* 1.20  --   --  1.09  --   --   --    < > = values in this interval not displayed.    Assessment: 56yo male now w/ lower heparin level despite rate increase last night; no gtt issues per RN.  Goal of Therapy:  Heparin level 0.3-0.7 units/ml   Plan:  Will increase heparin gtt by 1 unit/kg/hr to 1900 units/hr and check level in 6hr.  Wynona Neat, PharmD, BCPS  10/20/2017,3:29 AM

## 2017-10-20 NOTE — Discharge Instructions (Signed)
Wear compression socks 20-54mmHg daily - putting them on every morning and take them off in the evening.  You do not need to sleep in these.  Elevate legs daily when not walking to help with swelling.    Information on my medicine - XARELTO (rivaroxaban)  This medication education was reviewed with me or my healthcare representative as part of my discharge preparation.   WHY WAS XARELTO PRESCRIBED FOR YOU? Xarelto was prescribed to treat blood clots that may have been found in the veins of your legs (deep vein thrombosis) and to reduce the risk of them occurring again.  What do you need to know about Xarelto? The starting dose is one 15 mg tablet taken TWICE daily with food for the FIRST 21 DAYS then on 11/10/2017 the dose is changed to one 20 mg tablet taken ONCE A DAY with your evening meal.  DO NOT stop taking Xarelto without talking to the health care provider who prescribed the medication.  Refill your prescription for 20 mg tablets before you run out.  After discharge, you should have regular check-up appointments with your healthcare provider that is prescribing your Xarelto.  In the future your dose may need to be changed if your kidney function changes by a significant amount.  What do you do if you miss a dose? If you are taking Xarelto TWICE DAILY and you miss a dose, take it as soon as you remember. You may take two 15 mg tablets (total 30 mg) at the same time then resume your regularly scheduled 15 mg twice daily the next day.  If you are taking Xarelto ONCE DAILY and you miss a dose, take it as soon as you remember on the same day then continue your regularly scheduled once daily regimen the next day. Do not take two doses of Xarelto at the same time.   Important Safety Information Xarelto is a blood thinner medicine that can cause bleeding. You should call your healthcare provider right away if you experience any of the following: ? Bleeding from an injury or your nose  that does not stop. ? Unusual colored urine (red or dark brown) or unusual colored stools (red or black). ? Unusual bruising for unknown reasons. ? A serious fall or if you hit your head (even if there is no bleeding).  Some medicines may interact with Xarelto and might increase your risk of bleeding while on Xarelto. To help avoid this, consult your healthcare provider or pharmacist prior to using any new prescription or non-prescription medications, including herbals, vitamins, non-steroidal anti-inflammatory drugs (NSAIDs) and supplements.  This website has more information on Xarelto: [email or web address]

## 2017-10-20 NOTE — Progress Notes (Addendum)
  Progress Note    10/20/2017 7:11 AM 1 Day Post-Op  Subjective:  Wants out of his room to go outside for mental health reasons.  Leg feels better  Afebrile HR 60's-80's NSR 081'K-481'E systolic 56% RA  Vitals:   10/19/17 2318 10/20/17 0313  BP: 117/63 (!) 116/57  Pulse: 80 81  Resp: 16 11  Temp: 98.1 F (36.7 C) 98.7 F (37.1 C)  SpO2: 97% 96%    Physical Exam: Incisions:  Site is clean and dry with dressing in tact Extremities:  Left leg calf is soft and non tender with TED hose in place  CBC    Component Value Date/Time   WBC 4.8 10/20/2017 0246   RBC 3.42 (L) 10/20/2017 0246   HGB 10.4 (L) 10/20/2017 0246   HCT 30.4 (L) 10/20/2017 0246   PLT 124 (L) 10/20/2017 0246   MCV 88.9 10/20/2017 0246   MCH 30.4 10/20/2017 0246   MCHC 34.2 10/20/2017 0246   RDW 13.1 10/20/2017 0246   LYMPHSABS 2.2 10/15/2017 1813   MONOABS 0.8 10/15/2017 1813   EOSABS 0.4 10/15/2017 1813   BASOSABS 0.0 10/15/2017 1813    BMET    Component Value Date/Time   NA 137 10/20/2017 0245   K 4.3 10/20/2017 0245   CL 107 10/20/2017 0245   CO2 23 10/20/2017 0245   GLUCOSE 200 (H) 10/20/2017 0245   BUN 12 10/20/2017 0245   CREATININE 1.15 10/20/2017 0245   CALCIUM 8.4 (L) 10/20/2017 0245   GFRNONAA >60 10/20/2017 0245   GFRAA >60 10/20/2017 0245    INR    Component Value Date/Time   INR 0.99 10/18/2017 0412     Intake/Output Summary (Last 24 hours) at 10/20/2017 0711 Last data filed at 10/20/2017 0317 Gross per 24 hour  Intake 1996.08 ml  Output -  Net 1996.08 ml     Assessment:  56 y.o. male is s/p:  1.  IVUS of left popliteal, femoral, common femoral, external and common iliac veins and ivc 2.  Balloon angioplasty of left femoral, common femoral, external and common iliac veins and ivc with 12 and 35mm balloons 3.  Percutaneous mechanical thrombectomy with angiojet for 98 seconds of left femoral, common femoral veins 4.  Left lower extremity venogram 5.  Moderate  sedation with fentanyl and versed for 51 minutes  1 Day Post-Op  Plan: -transition to oral agent today-case management for medication needs -creatinine looks good this am; platelets stable; hgb down slightly but stable -DVT prophylaxis:  Heparin gtt -home soon.   Leontine Locket, PA-C [facility] [phone] 10/20/2017 7:11 AM  I have independently interviewed and examined patient and agree with PA assessment and plan above. Restart xarelto today and will need indefinitely. Needs to get colonoscopy as routine cancer screening and can bridge with lovenox. Will also take baby aspirin. Stressed need to ambulation and compression stockings. F/u in 4-6 weeks to evaluate symptoms. If he is asymptomatic then will follow conservatively but if he remains symptomatic then will consider stenting of left cfv.   Kariana Wiles C. Donzetta Matters, MD [facility] [place] Office: [phone] Pager: [phone]

## 2017-10-20 NOTE — Telephone Encounter (Signed)
-----   Message from Mena Goes, RN sent at 10/20/2017 10:15 AM EST ----- Regarding: 4-6 weeks    ----- Message ----- From: Gabriel Earing, PA-C Sent: 10/20/2017   8:45 AM To: [facility] Charge Pool  S/p venous thrombectomy 10/19/17.  F/u with Dr. Donzetta Matters in 4-6 weeks.  No studies needed.  Thanks

## 2017-10-20 NOTE — Discharge Summary (Signed)
Discharge Summary    Brian Torres 02/02/1961 56 y.o. male  [id number]  Admission Date: 10/15/2017  Discharge Date: 10/20/17  Physician: Kerney Elbe, DO  Admission Diagnosis: Renal insufficiency [N28.9] Acute deep vein thrombosis (DVT) of femoral vein of left lower extremity ([place]) [I82.412]   HPI:   This is a 56 y.o. male who is admitted for left leg DVT.  He has a prior history of DVT approximately 1 year ago.  He was treated with 7 months of Xarelto and then discontinued.  He presents today with a new episode of significant left leg swelling in his entire leg.  He reports that prior to today he would have some swelling in his knee distally with prolonged standing but this is dramatically different.  He underwent outpatient duplex showing occlusion of his right common femoral, femoral and popliteal veins.  He is admitted for IV antibiotics and planned lysis.  He does not have any history of hypercoagulability.  He denies of clotting disorders.  He does not recall any trauma around the time of his first DVT or this event.   Hospital Course:  The patient was admitted to the hospital and placed on IV heparin.  Plan lysis and venogram left leg DVT.  The patient reports provider visited this morning and was talking about what sounds potentially like a chest CT for pulmonary embolus.  Would recommend deferring this to reduce contrast since he will have additional contrast on Monday with very mild baseline renal insufficiency   The pt was taken to the [facility] lab on 10/19/2017 and underwent: Procedure Performed: 1.  US guided access of left popliteal vein 2.  Venogram left lower extremity and ivc 3.  IVUS of left popliteal, femoral, common femoral, external and common iliac veins and ivc 4.  Placement of 50cm treatment length infusion catheter 5.  Moderate sedation with fentanyl and versed for 47 minutes.    Findings: Popliteal veins were noncompressible.  Femoral vein distally  appeared to be totally occluded with at least some chronic component and there was an [place] of patency mid femoral vein.  There was acute appearing clot more cephalad in the femoral vein and the common femoral vein was completely occluded with acute and chronic appearing thrombus.  The external iliac vein was similarly occluded.  Extending into the common iliac vein there was acute and chronic thrombus that was nonocclusive.  The IVC was patent.  There was some impingement of the right common iliac artery but did not necessarily have May Thurner's syndrome.   The pt tolerated the procedure well and was transported to the PACU in good condition.   On 10/19/17, pt was taken back to the [facility] lab for lysis recheck.  tpa was discontinued and systemic heparin ordered.  Procedure Performed: 1.  IVUS of left popliteal, femoral, common femoral, external and common iliac veins and ivc 2.  Balloon angioplasty of left femoral, common femoral, external and common iliac veins and ivc with 12 and 31mm balloons 3.  Percutaneous mechanical thrombectomy with angiojet for 98 seconds of left femoral, common femoral veins 4.  Left lower extremity venogram 5.  Moderate sedation with fentanyl and versed for 51 minutes   Findings: Much of the acute clot in the left lower extremity had resolved.  There was chronic appearing clot in the femoral cephalad and the common femoral veins.  There do not appear to be any impingement at the IVC confluence.  After AngioJet balloon angioplasty there is a channel  at the common femoral vein but remains with significant chronic thrombus.  I elected not to stent here given that there was no collateral flow and also that there is a channel and would have to be only a common femoral stent as the rest is free of disease.  If he continues to have swelling and lower extremity symptoms he will need to be considered for common femoral vein stenting on the left.  He was transitioned to Xarelto  from heparin.  Needs to get colonoscopy as routine cancer screening and can bridge with lovenox. Will also take baby aspirin. Stressed need to ambulation and compression stockings. F/u in 4-6 weeks to evaluate symptoms. If he is asymptomatic then will follow conservatively but if he remains symptomatic then will consider stenting of left cfv.   DM2 (Diabetes Mellitus, Type 2) (HCC) -Continue Glimepiride, Actos, patient wants to continue his home medications -Metformin held due to acute renal insufficiency and need for contrast agents.  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 (HCC) -Creatinine improving, 1.5 on admission,improved to 1.15 -Continue to Monitor and repeat BMP as an outpatient   Normocytic Anemia -Hb/Hct dropped from 11.8/34.2 -> 11.0/32.2 -> 10.4/30.4 -Continue to Monitor for S/Sx of Bleeding -Repeat CBC in AM   Thrombocytopenia -Likely Reactive but if continues to drop will need to r/o HIT -Continue to Monitor while patient is Anticoagulated with Heparin gtt -Platelet Count went from 180 -> 124 -> 122 -> 124 -Repeat CBC as an outpatient with PCP   The remainder of the hospital course consisted of increasing mobilization and increasing intake of solids without difficulty.  CBC    Component Value Date/Time   WBC 4.8 10/20/2017 0246   RBC 3.42 (L) 10/20/2017 0246   HGB 10.4 (L) 10/20/2017 0246   HCT 30.4 (L) 10/20/2017 0246   PLT 124 (L) 10/20/2017 0246   MCV 88.9 10/20/2017 0246   MCH 30.4 10/20/2017 0246   MCHC 34.2 10/20/2017 0246   RDW 13.1 10/20/2017 0246   LYMPHSABS 2.2 10/15/2017 1813   MONOABS 0.8 10/15/2017 1813   EOSABS 0.4 10/15/2017 1813   BASOSABS 0.0 10/15/2017 1813    BMET     Component Value Date/Time   NA 137 10/20/2017 0245   K 4.3 10/20/2017 0245   CL 107 10/20/2017 0245   CO2 23 10/20/2017 0245   GLUCOSE 200 (H) 10/20/2017 0245   BUN 12 10/20/2017 0245   CREATININE 1.15 10/20/2017 0245   CALCIUM 8.4 (L) 10/20/2017 0245   GFRNONAA >60 10/20/2017 0245   GFRAA >60 10/20/2017 0245      Discharge Instructions    Call MD for:  redness, tenderness, or signs of infection (pain, swelling, bleeding, redness, odor or green/yellow discharge around incision site)   Complete by:  As directed    Call MD for:  severe or increased pain, loss or decreased feeling  in affected limb(s)   Complete by:  As directed    Call MD for:  temperature >100.5   Complete by:  As directed    Discharge instructions   Complete by:  As directed    Resume Metformin on 10/22/17.   Discharge instructions   Complete by:  As  directed    Take Xarelto starter pack as directed.  Once that is finished, start Xarelto 20mg  daily.  (you will have a prescription for both of these).   Discharge wound care:   Complete by:  As directed    Shower daily with soap and water starting 10/20/17   Driving Restrictions   Complete by:  As directed    No driving for 72 hours and while taking pain medication.   Lifting restrictions   Complete by:  As directed    No lifting for 3 weeks   Resume previous diet   Complete by:  As directed       Discharge Diagnosis:  Renal insufficiency [N28.9] Acute deep vein thrombosis (DVT) of femoral vein of left lower extremity (HCC) [I82.412]  Secondary Diagnosis: Patient Active Problem List   Diagnosis Date Noted  . Normocytic anemia 10/19/2017  . Thrombocytopenia ([place]) 10/19/2017  . Leg DVT (deep venous thromboembolism), acute, left ([place]) 10/15/2017  . DM2 (diabetes mellitus, type 2) ([place]) 10/15/2017  . HTN (hypertension) 10/15/2017  . 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 Depression    . Diabetes mellitus without complication ([place])   . Dyslipidemia   . Erectile dysfunction   . Nicotine addiction      Allergies as of 10/20/2017   No Known Allergies     Medication List    TAKE these medications   b complex vitamins tablet Take 1 tablet by mouth daily.   CINNAMON PO Take 1,000 mg by mouth 2 (two) times daily.   fenofibrate 48 MG tablet Commonly known as:  TRICOR Take 48 mg by mouth daily.   glimepiride 4 MG tablet Commonly known as:  AMARYL Take 4 mg by mouth daily.   Krill Oil 350 MG Caps Take 350 mg by mouth 2 (two) times daily.   lisinopril 10 MG tablet Commonly known as:  PRINIVIL,ZESTRIL Take 10 mg by mouth daily.   metFORMIN 1000 MG tablet Commonly known as:  GLUCOPHAGE Take 1,000 mg by mouth 2 (two) times daily.   pioglitazone 45 MG tablet Commonly known as:  ACTOS Take 45 mg by mouth daily.   Rivaroxaban 15 & 20 MG Tbpk Take as directed on package: Start with one 15mg  tablet by mouth twice a day with food. On Day 22, switch to one 20mg  tablet once a day with food.   rivaroxaban 20 MG Tabs tablet Commonly known as:  XARELTO Take 1 tablet (20 mg total) by mouth daily with supper.            Discharge Care Instructions  (From admission, onward)        Start     Ordered   10/20/17 0000  Discharge wound care:    Comments:  Shower daily with soap and water starting 10/20/17   10/20/17 0845      Prescriptions given: 1.  Xarelto starter pack 2.  Xarelto 20mg  daily to start when he is finished with the starter pack.  #[id number]  Instructions: 1.  No driving for 72 hours and while taking pain medication 2.  No heavy lifting x 3 weeks. 3.  Shower daily starting 10/20/17 4.  Resume Metformin on 10/22/17. 5.  Take Xarelto starter pack as directed.  Once that is finished, start Xarelto 20mg  daily.  (you will have a prescription for both of these). 6.  Although not on the discharge papers, Dr. Donzetta Matters  had extensive discussion with pt  to start taking an aspirin daily. Pt and wife expressed understanding.  Disposition: home  Patient's condition: is Good  Follow up: 1. Dr. Donzetta Matters in 4-6 weeks   Leontine Locket, PA-C [facility] [phone] 10/20/2017  8:51 AM

## 2017-10-20 NOTE — Progress Notes (Addendum)
Benefit check sent for Brian Torres and Eliquis  Patient Rx'd Brian Torres, provided with 30 day free card and 1 year copay card. No other CM needs identified.

## 2017-10-20 NOTE — Progress Notes (Signed)
PROGRESS NOTE    KAMRYN GAUTHIER  YFV:[id number] DOB: 1960-11-06 DOA: 10/15/2017 PCP: Cari Caraway, MD   Brief Narrative:  Johntay Doolen Allenis a 56 y.o.malewith medical history significant ofprior LLE DVT earlier this year was on 7 months of Xarelto, DM2, HTN, CKD stage 3. Patient presented to the ED with L leg pain and swelling. Onset earlier on the day of admission and he went to PCP. He was found to have a large occlusive L leg DVT. Vascular surgeru Dr. Donzetta Matters was contacted and had patient sent to ED for heparin gtt, admission, and lysis. He underwent venogram and IVUS of Left Popliteal, Femoral, Common Femoral, and External and Common iliac veins and IVC on 10/18/17 and on 12/18 underwent balloon angioplasty and percutaneous mechanical thrombectomy. Patient was transitioned to Xarelto by Vascular Surgery and discharged by Vascular Surgery this AM.   Assessment & Plan:   Principal Problem:   Leg DVT (deep venous thromboembolism), acute, left (HCC) Active Problems:   DM2 (diabetes mellitus, type 2) (HCC)   HTN (hypertension)   CKD stage 3 due to type 2 diabetes mellitus (HCC)   Normocytic anemia   Thrombocytopenia (HCC)  Leg DVT (deep venous thromboembolism), acute, left ([place]) -Patient was initially found to have acute left DVT in 12/17, finished 7 months of Xarelto - Now presented again with left leg pain and swelling, Doppler ultrasound of the left lower extremity showed extensive acute thrombus throughout the left lower extremity from the common femoral vein through the calf veins with superficial thrombophlebitis and greater saphenous and short saphenous veins. - Unclear cause of the first DVT, Hypercoagulable syndrome (?), discussed with the patient, could not find any precipitating factors.  Cannot find any records in epic about previous DVT. -Needs to have hypercoagulable workup outpatient and will need Hematology referral  -Dr. Tana Coast recommended VQ scan, if higher probability of  PE, will need prolonged or lifelong anticoagulation.  Patient is however having no symptoms at this time of chest pain or shortness of breath/DOE, he wants to hold off on the imaging until vascular surgery recommends it.  No CTA chest due to renal insufficiency -Patient underwent Venogram and placement of Lytic Cathether along with Thrombolysis and Thrombectomy along with balloon angioplasty (12/17 and 12/18) -IV Heparin gtt transitioned to po Xarelto by Vascular Surgery (15 mg BID x 21 days and then  -Continue to Monitor for S/Sx of Bleeding -Fibrinogen Level decreased from 422 -> 104 -> 88 -Platelet Count and Blood Count stable  -Repeat CBC as an outpatient   -Follow up with Dr. Donzetta Matters in 4-6 weeks  DM2 (Diabetes Mellitus, Type 2) ([place]) -Continue Glimepiride, Actos, patient wants to continue his home medications -Metformin held due to acute renal insufficiency and need for contrast agents.   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 (HCC) -Creatinine improving, 1.5 on admission, improved to 1.15 -Continue to Monitor and repeat BMP as an outpatient   Normocytic Anemia -Hb/Hct dropped from 11.8/34.2 -> 11.0/32.2 -> 10.4/30.4 -Continue to Monitor for S/Sx of Bleeding -Repeat CBC in AM   Thrombocytopenia -Likely Reactive but if continues to drop will need to r/o HIT -Continue to Monitor while patient is Anticoagulated with Heparin gtt -  Platelet Count went from 180 -> 124 -> 122 -> 124 -Repeat CBC as an outpatient with PCP  DVT prophylaxis: Heparin gtt transitioned to Xarelto by Vascular Surgery Code Status: FULL CODE Family Communication: Discussed by wife at bedside  Disposition Plan: Discharged home by  Vascular  Consultants:   Vascular Surgery Dr. Donzetta Matters  Procedures:  10/18/17 1.  US guided access of left popliteal vein 2.  Venogram left lower extremity and ivc 3.  IVUS of left popliteal, femoral, common femoral, external and common iliac veins and ivc 4.  Placement of 50cm treatment length infusion catheter  10/19/17 1.  IVUS of left popliteal, femoral, common femoral, external and common iliac veins and ivc 2.  Balloon angioplasty of left femoral, common femoral, external and common iliac veins and ivc with 12 and 32mm balloons 3.  Percutaneous mechanical thrombectomy with angiojet for 98 seconds of left femoral, common femoral veins 4.  Left lower extremity venogram 5.  Moderate sedation with fentanyl and versed for 51 minutes   Antimicrobials:  Anti-infectives (From admission, onward)   Start     Dose/Rate Route Frequency Ordered Stop   10/18/17 1500  ceFAZolin (ANCEF) IVPB 2g/100 mL premix  Status:  Discontinued     2 g 200 mL/hr over 30 Minutes Intravenous Every 8 hours 10/18/17 1421 10/19/17 1018   10/18/17 0600  ceFAZolin (ANCEF) IVPB 1 g/50 mL premix  Status:  Discontinued     1 g 100 mL/hr over 30 Minutes Intravenous On call to O.R. 10/17/17 1115 10/17/17 1156   10/18/17 0600  ceFAZolin (ANCEF) IVPB 2g/100 mL premix  Status:  Discontinued     2 g 200 mL/hr over 30 Minutes Intravenous On call to O.R. 10/17/17 1156 10/18/17 1633     Subjective: Seen and examined this AM and had no complaints. Denied any pain. Wanting to go home. Heparin gtt changed to Xarelto by Vascular and patient D/C'd this AM by their service.   Objective: Vitals:   10/19/17 1938 10/19/17 2318 10/20/17 0313 10/20/17 0825  BP: 131/81 117/63 (!) 116/57 115/69  Pulse: 64 80 81 78  Resp: (!) 21 16 11 18   Temp: 98.3 F (36.8 C) 98.1 F (36.7 C) 98.7 F (37.1 C) 98 F (36.7 C)  TempSrc: Oral Oral Oral Oral  SpO2: 100% 97% 96% 99%  Weight:      Height:        Intake/Output Summary (Last 24  hours) at 10/20/2017 1124 Last data filed at 10/20/2017 0900 Gross per 24 hour  Intake 1876.18 ml  Output -  Net 1876.18 ml   Filed Weights   10/15/17 1845 10/15/17 2132  Weight: 102.1 kg (225 lb) 103.9 kg (229 lb)   Examination: Physical Exam:  Constitutional: WN/WD Caucasian male in NAD ENMT: MMM; External Ears and Nose appear Normal Neck: Supple with no JVD; External Ears and nose appear normal Respiratory: CTAB; No appreciable wheezing/rales/rhonchi Cardiovascular: RRR; No appreciable m/r/g. Had 1+ LE Edema Abdomen: Soft, NT, ND. Bowel Sounds present GU: Deferred Musculoskeletal: No contractures; No cyanosis Skin: Warm and Dry. No rashes or lesions on a limited skin eval. Right Leg had some swelling  Neurologic: CN 2-12 grossly intact. No appreciable focal deficits Psychiatric: Normal mood and affect. Intact judgement and insight  Data Reviewed: I have personally reviewed following labs and imaging studies  CBC: Recent Labs  Lab 10/15/17 1813  10/18/17 0412 10/18/17 1648 10/18/17 2230 10/19/17 0559 10/20/17 0246  WBC 9.1   < > 4.8 4.7  6.4 5.0 4.8  NEUTROABS 5.7  --   --   --   --   --   --   HGB 14.1   < > 11.6* 11.8* 11.0* 11.0* 10.4*  HCT 40.9   < > 34.4* 34.2* 32.4* 32.2* 30.4*  MCV 88.9   < > 89.1 89.1 89.0 88.7 88.9  PLT 187   < > 179 180 124* 122* 124*   < > = values in this interval not displayed.   Basic Metabolic Panel: Recent Labs  Lab 10/16/17 0944 10/17/17 0547 10/18/17 0412 10/19/17 0559 10/20/17 0245  NA 132* 136 136 135 137  K 5.2* 4.4 4.2 4.5 4.3  CL 102 104 105 108 107  CO2 23 24 22 23 23   GLUCOSE 205* 132* 220* 154* 200*  BUN 21* 20 16 11 12   CREATININE 1.54* 1.34* 1.20 1.09 1.15  CALCIUM 8.9 8.9 8.8* 8.5* 8.4*  MG  --   --   --   --  1.8  PHOS  --   --   --   --  3.1   GFR: Estimated Creatinine Clearance: 87 mL/min (by C-G formula based on SCr of 1.15 mg/dL). Liver Function Tests: Recent Labs  Lab 10/20/17 0245  AST 35  ALT  27  ALKPHOS 62  BILITOT 0.5  PROT 5.6*  ALBUMIN 3.1*   No results for input(s): LIPASE, AMYLASE in the last 168 hours. No results for input(s): AMMONIA in the last 168 hours. Coagulation Profile: Recent Labs  Lab 10/18/17 0412  INR 0.99   Cardiac Enzymes: No results for input(s): CKTOTAL, CKMB, CKMBINDEX, TROPONINI in the last 168 hours. BNP (last 3 results) No results for input(s): PROBNP in the last 8760 hours. HbA1C: No results for input(s): HGBA1C in the last 72 hours. CBG: Recent Labs  Lab 10/19/17 0808 10/19/17 1217 10/19/17 1601 10/19/17 2139 10/20/17 0614  GLUCAP 145* 221* 138* 92 163*   Lipid Profile: No results for input(s): CHOL, HDL, LDLCALC, TRIG, CHOLHDL, LDLDIRECT in the last 72 hours. Thyroid Function Tests: No results for input(s): TSH, T4TOTAL, FREET4, T3FREE, THYROIDAB in the last 72 hours. Anemia Panel: No results for input(s): VITAMINB12, FOLATE, FERRITIN, TIBC, IRON, RETICCTPCT in the last 72 hours. Sepsis Labs: No results for input(s): PROCALCITON, LATICACIDVEN in the last 168 hours.  Recent Results (from the past 240 hour(s))  MRSA PCR Screening     Status: None   Collection Time: 10/18/17  4:37 PM  Result Value Ref Range Status   MRSA by PCR NEGATIVE NEGATIVE Final    Comment:        The GeneXpert MRSA Assay (FDA approved for NASAL specimens only), is one component of a comprehensive MRSA colonization surveillance program. It is not intended to diagnose MRSA infection nor to guide or monitor treatment for MRSA infections.     Radiology Studies: No results found.  Scheduled Meds: . aspirin EC  81 mg Oral Daily  . B-complex with vitamin C  1 tablet Oral Q24H  . fenofibrate  54 mg Oral Q24H  . glimepiride  4 mg Oral Q breakfast  . insulin aspart  0-9 Units Subcutaneous TID WC  . metoprolol tartrate  5 mg Intravenous Q6H  . multivitamin with minerals  1 tablet Oral Q24H  . pioglitazone  45 mg Oral Q breakfast  . rivaroxaban  15  mg Oral BID  . [START ON 11/10/2017] rivaroxaban  20 mg Oral Q supper  . sodium chloride flush  3 mL  Intravenous Q12H   Continuous Infusions: . sodium chloride Stopped (10/18/17 2000)  . sodium chloride       LOS: 4 days   Kerney Elbe, DO [facility] Pager [phone]  If 7PM-7AM, please contact night-coverage [email or web address] Password [email or web address] 10/20/2017, 11:24 AM

## 2017-10-20 NOTE — Progress Notes (Signed)
ANTICOAGULATION CONSULT NOTE - Follow Up Consult  Pharmacy Consult for heparin> rivaroxaban  Indication: DVT  No Known Allergies  Patient Measurements: Height: 6\' 4"  (193 cm) Weight: 229 lb (103.9 kg) IBW/kg (Calculated) : 86.8  Vital Signs: Temp: 98 F (36.7 C) (12/19 0825) Temp Source: Oral (12/19 0825) BP: 115/69 (12/19 0825) Pulse Rate: 78 (12/19 0825)  Labs: Recent Labs    10/18/17 0412  10/18/17 2230 10/19/17 0559 10/19/17 1933 10/20/17 0245 10/20/17 0246 10/20/17 0849  HGB 11.6*   < > 11.0* 11.0*  --   --  10.4*  --   HCT 34.4*   < > 32.4* 32.2*  --   --  30.4*  --   PLT 179   < > 124* 122*  --   --  124*  --   LABPROT 13.0  --   --   --   --   --   --   --   INR 0.99  --   --   --   --   --   --   --   HEPARINUNFRC 0.44   < > <0.10*  --  0.31 0.29*  --  0.49  CREATININE 1.20  --   --  1.09  --  1.15  --   --    < > = values in this interval not displayed.    Estimated Creatinine Clearance: 87 mL/min (by C-G formula based on SCr of 1.15 mg/dL).   Medical History: Past Medical History:  Diagnosis Date  . Depression   . Diabetes mellitus without complication ([place])   . Dyslipidemia   . Erectile dysfunction   . Nicotine addiction     Medications:  Medications Prior to Admission  Medication Sig Dispense Refill Last Dose  . b complex vitamins tablet Take 1 tablet by mouth daily.   10/15/2017 at Unknown time  . CINNAMON PO Take 1,000 mg by mouth 2 (two) times daily.   10/15/2017 at Unknown time  . fenofibrate (TRICOR) 48 MG tablet Take 48 mg by mouth daily.   10/15/2017 at Unknown time  . glimepiride (AMARYL) 4 MG tablet Take 4 mg by mouth daily.   10/15/2017 at Unknown time  . Krill Oil 350 MG CAPS Take 350 mg by mouth 2 (two) times daily.   10/15/2017 at Unknown time  . lisinopril (PRINIVIL,ZESTRIL) 10 MG tablet Take 10 mg by mouth daily.   10/15/2017 at Unknown time  . metFORMIN (GLUCOPHAGE) 1000 MG tablet Take 1,000 mg by mouth 2 (two) times daily.    10/15/2017 at am  . pioglitazone (ACTOS) 45 MG tablet Take 45 mg by mouth daily.   10/15/2017 at Unknown time   Assessment: 56yo male started on heparin for DVT and went to IR for catheter-directed tPA with low-dose heparin infusion, now to stop tPA and resume full-dose heparin. During procedure this morning it was found that much of the acute clot in the L LE has resolved. Notes indicate that there remains significant chronic thrombus in the CFV.  Heparin level this morning 0.5  is at goal on heparin drip rate 1900uts/hr. No bleeding issues noted.  Plan to transition to oral AC  Goal of Therapy:  Heparin level 0.3-0.7 units/ml Monitor platelets by anticoagulation protocol: Yes   Plan:  Stop heparin  rivaroxaban 15mg  BID x21 days then 20mg  daily Education complete.  Bonnita Nasuti Pharm.D. CPP, BCPS Clinical Pharmacist [phone] 10/20/2017 10:07 AM

## 2017-10-20 NOTE — Telephone Encounter (Signed)
Sched appt 12/03/17 at 12:00. Lm on cell#.

## 2017-10-21 ENCOUNTER — Other Ambulatory Visit: Payer: Self-pay | Admitting: Vascular Surgery

## 2017-10-21 MED ORDER — RIVAROXABAN (XARELTO) VTE STARTER PACK (15 & 20 MG): ORAL_TABLET | ORAL | 0 refills | Status: DC

## 2017-10-27 ENCOUNTER — Other Ambulatory Visit: Payer: Self-pay | Admitting: *Deleted

## 2017-10-27 NOTE — Patient Outreach (Signed)
First attempt to reach this [place] member to complete transition of care needs assessment. He was hospitalized at [facility] 10/15/17-10/20/18 for acute deep vein thrombosis of the left femoral vein requiring IV antibiotics and lysis. On 10/19/17 he had intravascular ultrasound (IVUS) of the left popliteal, femoral, common femoral, external and common iliac veins, then balloon angioplasty of left femoral, common femoral, external and common iliac veins, percutaneous mechanical thrombectomy and left lower extremity venogram. He was discharged on 10/20/17 on [place] and baby aspirin.  Left message on member's mobile number requesting return call.  RNCM will attempt to reach him on 12/27 if no return call today.   Barrington Ellison RN,CCM,CDE [place] Management Coordinator [facility] [employer] [phone] Office Fax [phone]

## 2017-10-28 ENCOUNTER — Other Ambulatory Visit: Payer: Self-pay | Admitting: *Deleted

## 2017-10-28 ENCOUNTER — Encounter: Payer: Self-pay | Admitting: *Deleted

## 2017-10-28 NOTE — Patient Outreach (Signed)
[facility]) Care Management  10/28/2017  Brian Torres September 17, 1961 [id number]   Subjective: Telephone call to patient's home/ mobile number, spoke with patient, and HIPAA verified.  Discussed [facility] Care Management [employer] Transition of care follow up, patient voiced understanding, and is in agreement to follow up.  Patient states he is doing great and has a follow up appointment with surgeon on 12/03/17. Patient states he is able to manage self care and has assistance as needed. Patient voices understanding of medical diagnosis, surgery, and treatment plan. States he is accessing his Christella Scheuermann benefits as needed via member services number on back of card or through [email or web address]  Patient states he has questions regarding [employer]'s benefit payment for ultrasound testing, RNCM advised patient to follow up with [facility] customer service, and provider regarding discounts.  Patient voices understanding and states he will follow up. Patient states he does not have any education material, transition of care, care coordination, disease management, disease monitoring, transportation, community resource, or pharmacy needs at this time.  States he is very appreciative of the follow up and is in agreement to receive [place] Management information.      Objective: Per KPN (Knowledge Performance Now, point of care tool), [employer] iCollaborate, and chart review, patient hospitalized 10/15/17 -10/20/17 for Acute deep vein thrombosis (DVT) of femoral vein of left lower extremity and Renal insufficiency.   Status post Balloon angioplasty of left femoral, common femoral, external and common iliac veins and ivc with 12 and 59mm balloons, Percutaneous mechanical thrombectomy with angiojet for 98 seconds of left femoral, common femoral veins, and Left lower extremity venogram on 10/19/17.   Patient has a history of diabetes, hypertension, chronic kidney disease stage 3, Normocytic Anemia, and  Thrombocytopenia.     Assessment:  Received [employer] Transition of care referral on 10/27/17.    Transition of care follow up completed, no care management needs, and will proceed with case closure.       Plan:  RNCM will send patient successful outreach letter, [facility] pamphlet, and magnet. RNCM will send case closure due to follow up completed / no care management needs request to [facility] Management.      Arieonna Medine H. Annia Friendly, BSN, [place] Management [facility] Telephonic CM Phone: [phone] Fax: [phone]

## 2017-12-03 ENCOUNTER — Encounter: Payer: Self-pay | Admitting: Vascular Surgery

## 2017-12-03 ENCOUNTER — Ambulatory Visit (INDEPENDENT_AMBULATORY_CARE_PROVIDER_SITE_OTHER): Payer: Managed Care, Other (non HMO) | Admitting: Vascular Surgery

## 2017-12-03 VITALS — BP 131/79 | HR 80 | Resp 20 | Ht 76.0 in | Wt 235.0 lb

## 2017-12-03 DIAGNOSIS — I82412 Acute embolism and thrombosis of left femoral vein: Secondary | ICD-10-CM | POA: Diagnosis not present

## 2017-12-03 NOTE — Progress Notes (Signed)
Patient ID: Brian Torres, male   DOB: 12-30-1960, 57 y.o.   MRN: [id number]  Reason for Consult: Follow-up (4-6 wk f/u s/p venous thrombectomy)   Referred by Cari Caraway, MD  Subjective:     HPI:  Brian Torres is a 57 y.o. male with history of extensive left lower extremity DVT is undergone lysis with percutaneous mechanical thrombectomy and balloon angioplasty of his left femoral common femoral external and common iliac veins.  He is now maintained on Xarelto also takes aspirin.  He is wearing his compression stocking.  He is not return to full activity but is playing golf occasionally.  He does have continued swelling of his left leg but states it is much better than preprocedure.  Leg does feel heavy but he is able to walk.  States that the swelling does relieve with rest and elevation.  He has no other complaints related to today's visit.  Past Medical History:  Diagnosis Date  . Depression   . Diabetes mellitus without complication ([place])   . Dyslipidemia   . Erectile dysfunction   . Nicotine addiction    History reviewed. No pertinent family history. Past Surgical History:  Procedure Laterality Date  . LOWER EXTREMITY ANGIOGRAPHY N/A 10/18/2017   Procedure: LOWER EXTREMITY ANGIOGRAPHY - IVUS - LYSIS CATH;  Surgeon: Waynetta Sandy, MD;  Location: [place] [facility];  Service: Cardiovascular;  Laterality: N/A;  . LOWER EXTREMITY ANGIOGRAPHY N/A 10/19/2017   Procedure: LOWER EXTREMITY ANGIOGRAPHY - LYSIS RECHECK;  Surgeon: Waynetta Sandy, MD;  Location: [facility];  Service: Cardiovascular;  Laterality: N/A;  . PERIPHERAL VASCULAR BALLOON ANGIOPLASTY Left 10/19/2017   Procedure: PERIPHERAL VASCULAR BALLOON ANGIOPLASTY;  Surgeon: Waynetta Sandy, MD;  Location: [place] [facility];  Service: Cardiovascular;  Laterality: Left;  Multiple Lower extremity venous sites  . PERIPHERAL VASCULAR INTERVENTION Left 10/18/2017   Procedure:  PERIPHERAL VASCULAR INTERVENTION;  Surgeon: Waynetta Sandy, MD;  Location: [place] [facility];  Service: Cardiovascular;  Laterality: Left;    Short Social History:  Social History   Tobacco Use  . Smoking status: Current Every Day Smoker    Packs/day: 0.50    Types: Cigarettes  . Smokeless tobacco: Never Used  Substance Use Topics  . Alcohol use: No    Frequency: Never    No Known Allergies  Current Outpatient Medications  Medication Sig Dispense Refill  . aspirin 81 MG tablet Take 81 mg by mouth daily.    Marland Kitchen b complex vitamins tablet Take 1 tablet by mouth daily.    Marland Kitchen CINNAMON PO Take 1,000 mg by mouth 2 (two) times daily.    . fenofibrate (TRICOR) 48 MG tablet Take 48 mg by mouth daily.    Marland Kitchen glimepiride (AMARYL) 4 MG tablet Take 4 mg by mouth daily.    Marland Kitchen lisinopril (PRINIVIL,ZESTRIL) 10 MG tablet Take 10 mg by mouth daily.    . metFORMIN (GLUCOPHAGE) 1000 MG tablet Take 1,000 mg by mouth 2 (two) times daily.    . Omega-3 Fatty Acids (FISH OIL) 1000 MG CAPS Take by mouth.    . pioglitazone (ACTOS) 45 MG tablet Take 45 mg by mouth daily.    . Rivaroxaban 15 & 20 MG TBPK Take as directed on package: Start with one 15mg  tablet by mouth twice a day with food. On Day 22, switch to one 20mg  tablet once a day with food. 51 each 0  . Krill Oil 350 MG CAPS Take  350 mg by mouth 2 (two) times daily.     No current facility-administered medications for this visit.     Review of Systems  Constitutional:  Constitutional negative. HENT: HENT negative.  Eyes: Eyes negative.  Respiratory: Respiratory negative.  Cardiovascular: Positive for leg swelling.  GI: Gastrointestinal negative.  Musculoskeletal: Musculoskeletal negative.  Skin: Skin negative.  Neurological: Neurological negative. Hematologic: Hematologic/lymphatic negative.  Psychiatric: Psychiatric negative.        Objective:  Objective   Vitals:   12/03/17 1340  BP: 131/79  Pulse: 80  Resp: 20  SpO2: 100%   Weight: 235 lb (106.6 kg)  Height: 6\' 4"  (1.93 m)   Body mass index is 28.61 kg/m.  Physical Exam  Constitutional: He appears well-developed.  HENT:  Head: Normocephalic.  Eyes: Pupils are equal, round, and reactive to light.  Neck: Normal range of motion.  Cardiovascular: Normal rate.  Pulses:      Dorsalis pedis pulses are 2+ on the right side, and 2+ on the left side.       Posterior tibial pulses are 2+ on the right side, and 2+ on the left side.  Pulmonary/Chest: Effort normal.  Abdominal: Soft. He exhibits no mass.  Musculoskeletal: He exhibits edema.  Skin: Skin is warm and dry.  Psychiatric: He has a normal mood and affect. His behavior is normal. Judgment and thought content normal.    Data: No studies today     Assessment/Plan:     57 year old male with recent history of extensive left lower extremity DVT underwent lysis and pharmacomechanical thrombectomy as well as balloon angioplasty.  At the time he did have a common femoral residual stenosis that was not stented given that it was isolated the common femoral.  He does have residual swelling in the leg but is wearing compression stockings and is gradually improving and increasing his activity level.  I told him to resume full activity and have encouraged walking.  He will also remain on Xarelto indefinitely and also taking baby aspirin.  We again discussed his need for colonoscopy.  He will follow-up in 3 months with repeat left lower extremity duplex.  If he remains symptomatic at some point he may need left common femoral vein stenting.     Waynetta Sandy MD [facility]

## 2017-12-06 ENCOUNTER — Other Ambulatory Visit: Payer: Self-pay

## 2017-12-06 DIAGNOSIS — I82402 Acute embolism and thrombosis of unspecified deep veins of left lower extremity: Secondary | ICD-10-CM

## 2018-02-17 IMAGING — US US EXTREM LOW VENOUS*L*
1 series · 13 of 24 positions shown · non-contrast
Comparison: None.

CLINICAL DATA: Left lower extremity swelling



[Series 1: us extrem low venous*left* · 0.08mm/px · 13 of 43 slices shown]
[im 1/43]
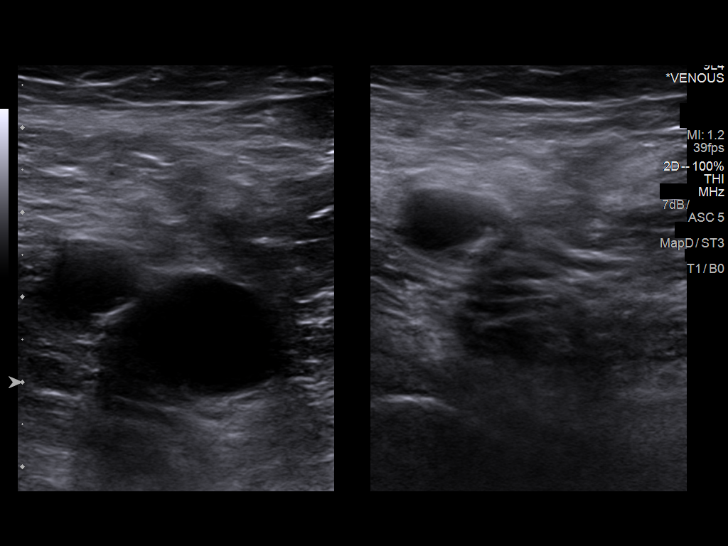
[im 4/43]
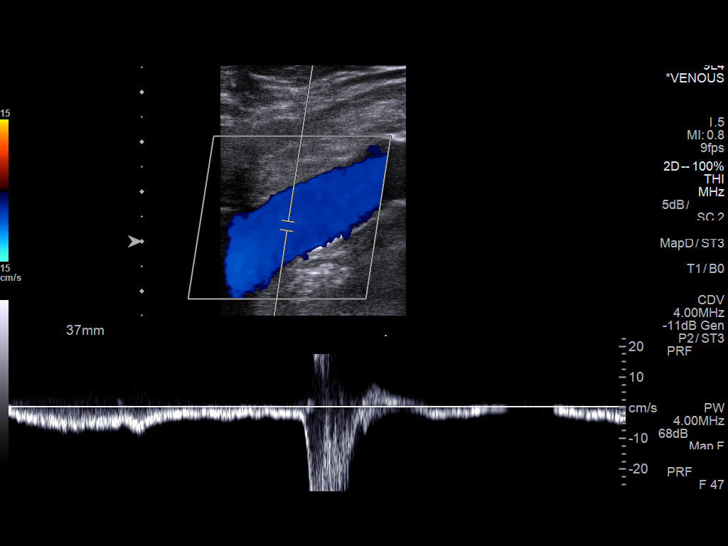
[im 8/43]
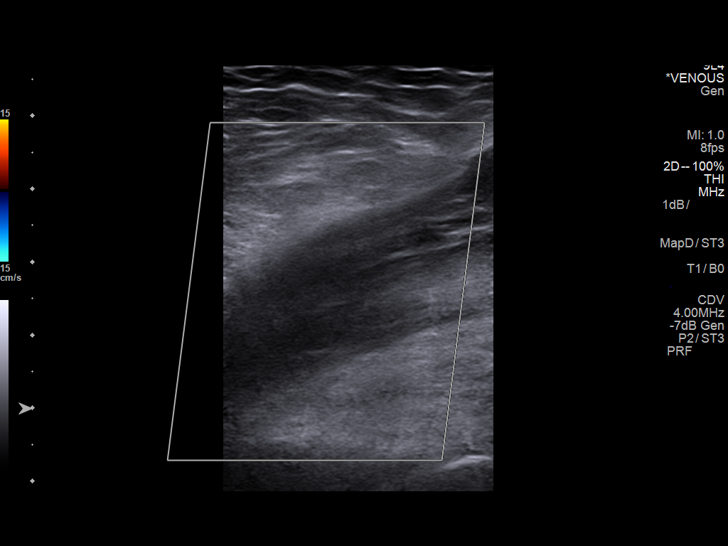
[im 11/43]
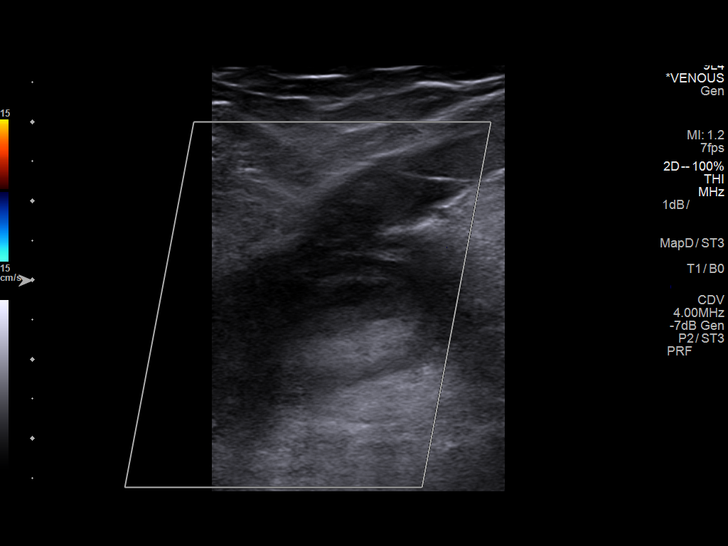
[im 15/43]
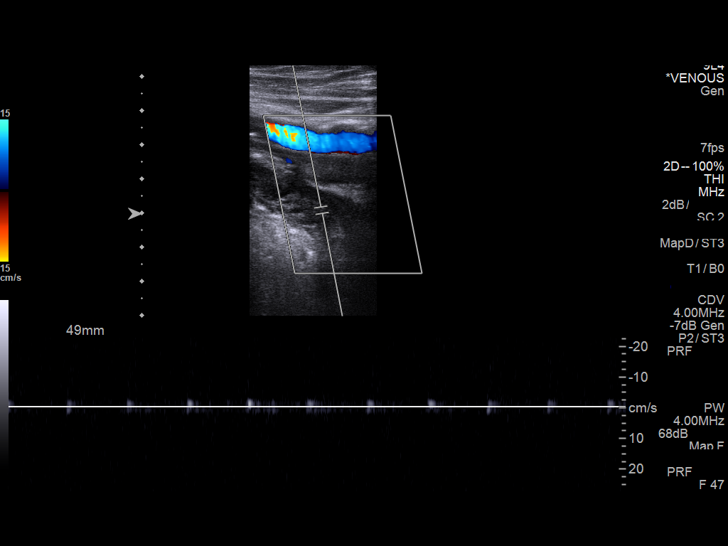
[im 19/43]
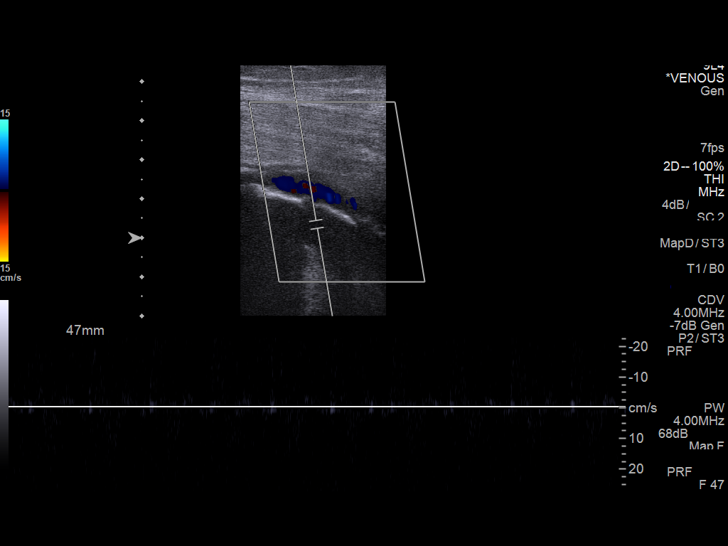
[im 22/43]
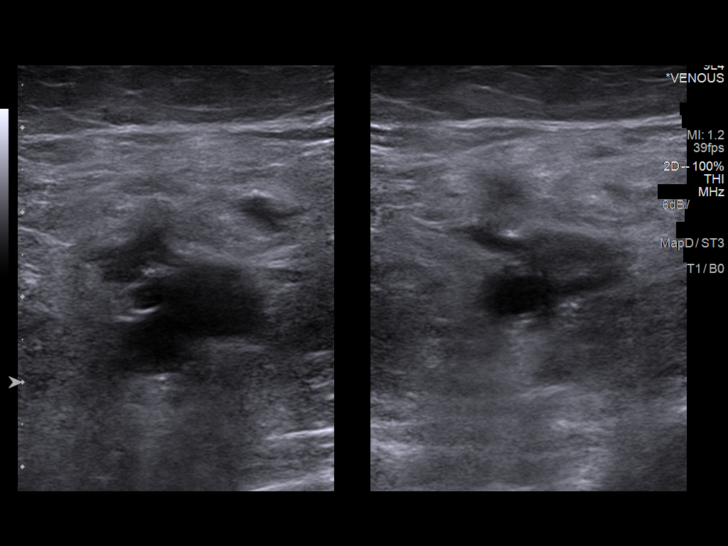
[im 24/43]
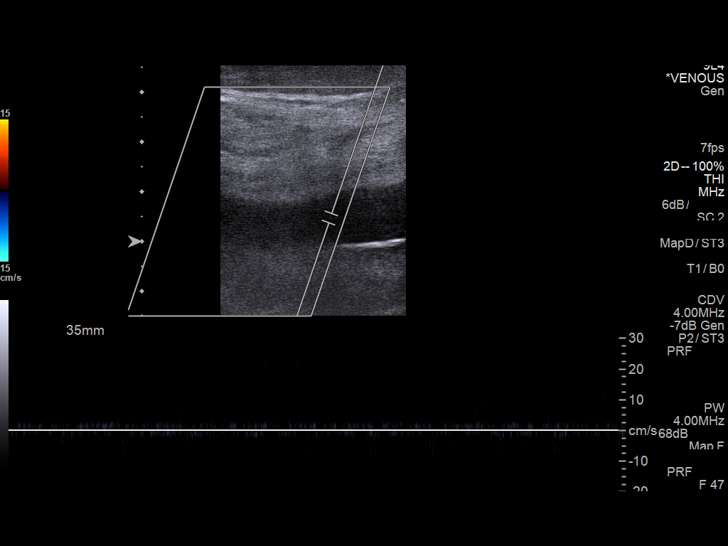
[im 28/43]
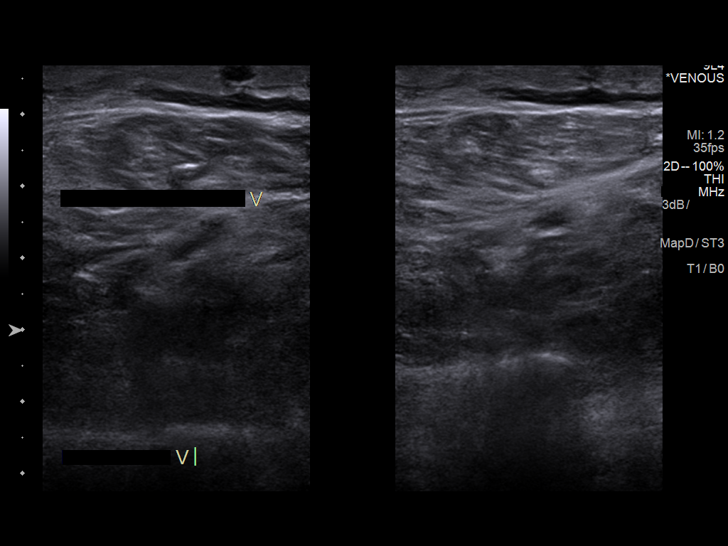
[im 32/43]
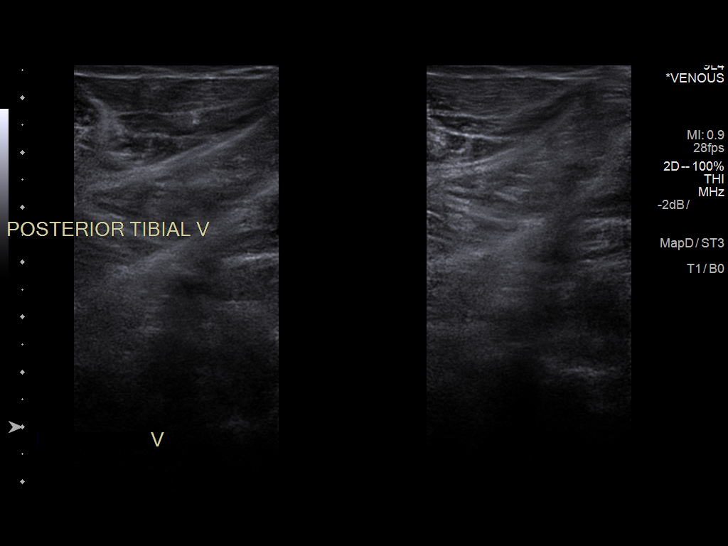
[im 35/43]
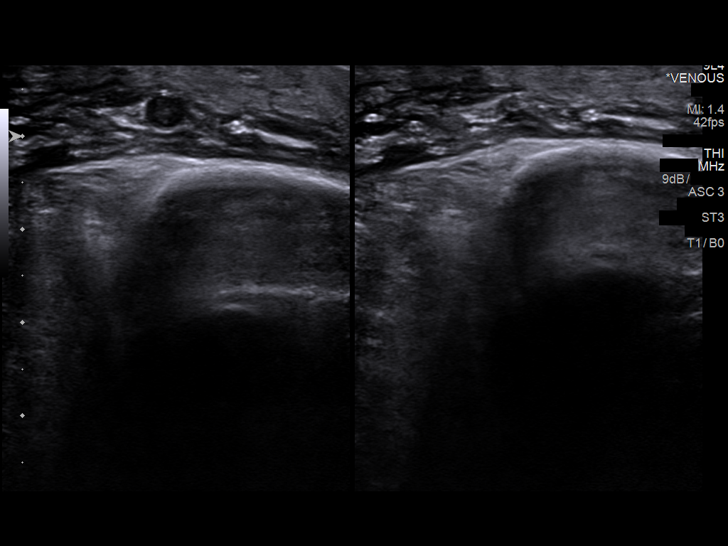
[im 39/43]
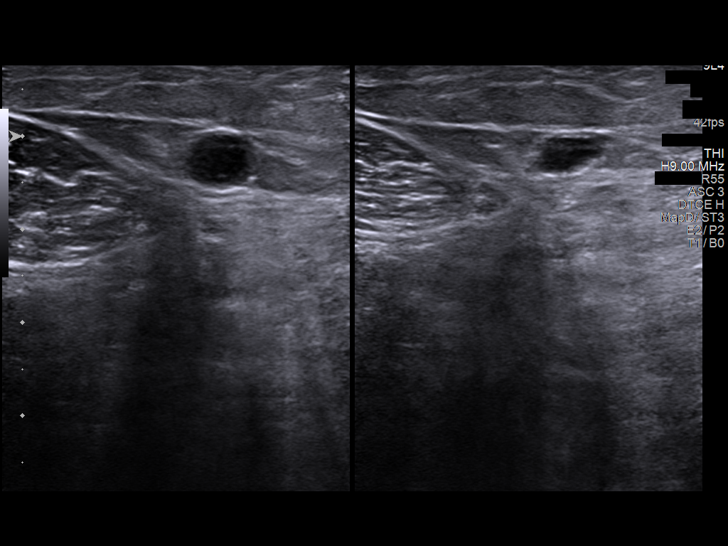
[im 43/43]
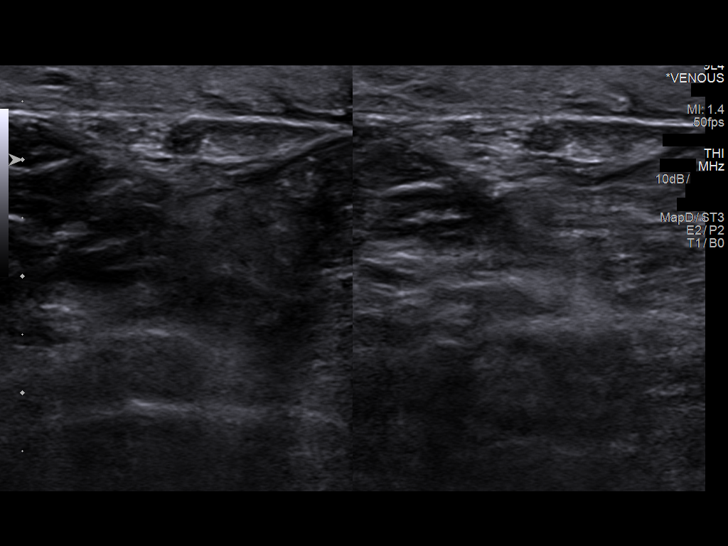

[13 of 24 positions shown; findings below may reference images not displayed]

FINDINGS: Contralateral Common Femoral Vein: Respiratory phasicity is normal
and symmetric with the symptomatic side. No evidence of thrombus.
Normal compressibility.

Common Femoral Vein: Occlusive acute thrombus noted within the
common femoral vein with expansion of the vein.

Saphenofemoral Junction: Occlusive thrombus noted in the saphenous
femoral junction.

Profunda Femoral Vein: Occlusive, acute thrombus noted.

Femoral Vein: Occlusive acute thrombus noted.

Popliteal Vein: Occlusive acute thrombus noted.

Calf Veins: Occlusive acute thrombus noted.

Superficial Great Saphenous Vein: Occlusive thrombus noted in the
great saphenous vein in thigh and calf and short saphenous vein
within the calf.

Venous Reflux:  N/A

Other Findings:  None.
IMPRESSION: Extensive acute thrombus throughout the left lower extremity from
the common femoral vein through the calf veins.

Superficial thrombophlebitis within the greater saphenous and short
saphenous veins in the left leg.

Critical Value/emergent results were called by telephone at the time
of interpretation on 10/15/2017 at [DATE] to Dr. MARUSJA DIAS ,
who verbally acknowledged these results.

## 2018-03-04 ENCOUNTER — Encounter: Payer: Self-pay | Admitting: Vascular Surgery

## 2018-03-04 ENCOUNTER — Ambulatory Visit (HOSPITAL_COMMUNITY)
Admission: RE | Admit: 2018-03-04 | Discharge: 2018-03-04 | Disposition: A | Discharge status: Home / Self Care | Payer: Managed Care, Other (non HMO) | Source: Ambulatory Visit | Attending: Vascular Surgery | Admitting: Vascular Surgery

## 2018-03-04 ENCOUNTER — Ambulatory Visit (INDEPENDENT_AMBULATORY_CARE_PROVIDER_SITE_OTHER): Payer: Managed Care, Other (non HMO) | Admitting: Vascular Surgery

## 2018-03-04 ENCOUNTER — Other Ambulatory Visit: Payer: Self-pay

## 2018-03-04 DIAGNOSIS — I871 Compression of vein: Secondary | ICD-10-CM | POA: Diagnosis not present

## 2018-03-04 DIAGNOSIS — M7989 Other specified soft tissue disorders: Secondary | ICD-10-CM | POA: Insufficient documentation

## 2018-03-04 DIAGNOSIS — I82402 Acute embolism and thrombosis of unspecified deep veins of left lower extremity: Secondary | ICD-10-CM | POA: Insufficient documentation

## 2018-03-04 NOTE — Progress Notes (Signed)
Patient ID: Brian Torres, male   DOB: Oct 01, 1961, 57 y.o.   MRN: [id number]  Reason for Consult: Follow-up (3 month f/u )   Referred by Cari Caraway, MD  Subjective:     HPI:  Brian Torres is a 57 y.o. male with previous extensive left lower extremity DVT.  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  He is also taking baby aspirin.  He is walking without much limitation.  He does have some swelling with walking but otherwise does not have any symptoms in his left lower extremity other than some slight pains around the knee. He does continue to smoke.  Past Medical History:  Diagnosis Date  . Depression   . Diabetes mellitus without complication ([place])   . Dyslipidemia   . Erectile dysfunction   . Nicotine addiction    History reviewed. No pertinent family history. Past Surgical History:  Procedure Laterality Date  . LOWER EXTREMITY ANGIOGRAPHY N/A 10/18/2017   Procedure: LOWER EXTREMITY ANGIOGRAPHY - IVUS - LYSIS CATH;  Surgeon: Waynetta Sandy, MD;  Location: [facility];  Service: Cardiovascular;  Laterality: N/A;  . LOWER EXTREMITY ANGIOGRAPHY N/A 10/19/2017   Procedure: LOWER EXTREMITY ANGIOGRAPHY - LYSIS RECHECK;  Surgeon: Waynetta Sandy, MD;  Location: [facility];  Service: Cardiovascular;  Laterality: N/A;  . PERIPHERAL VASCULAR BALLOON ANGIOPLASTY Left 10/19/2017   Procedure: PERIPHERAL VASCULAR BALLOON ANGIOPLASTY;  Surgeon: Waynetta Sandy, MD;  Location: [facility];  Service: Cardiovascular;  Laterality: Left;  Multiple Lower extremity venous sites  . PERIPHERAL VASCULAR INTERVENTION Left 10/18/2017   Procedure: PERIPHERAL VASCULAR INTERVENTION;  Surgeon: Waynetta Sandy, MD;  Location: [facility];  Service: Cardiovascular;  Laterality: Left;    Short Social History:  Social History   Tobacco Use  . Smoking status: Current Every  Day Smoker    Packs/day: 0.50    Types: Cigarettes  . Smokeless tobacco: Never Used  Substance Use Topics  . Alcohol use: No    Frequency: Never    No Known Allergies  Current Outpatient Medications  Medication Sig Dispense Refill  . aspirin 81 MG tablet Take 81 mg by mouth daily.    Marland Kitchen b complex vitamins tablet Take 1 tablet by mouth daily.    Marland Kitchen CINNAMON PO Take 1,000 mg by mouth 2 (two) times daily.    . fenofibrate (TRICOR) 48 MG tablet Take 48 mg by mouth daily.    Marland Kitchen glimepiride (AMARYL) 4 MG tablet Take 4 mg by mouth daily.    Marland Kitchen lisinopril (PRINIVIL,ZESTRIL) 10 MG tablet Take 10 mg by mouth daily.    . metFORMIN (GLUCOPHAGE) 1000 MG tablet Take 1,000 mg by mouth 2 (two) times daily.    . Omega-3 Fatty Acids (FISH OIL) 1000 MG CAPS Take by mouth.    . pioglitazone (ACTOS) 45 MG tablet Take 45 mg by mouth daily.    . Rivaroxaban 15 & 20 MG TBPK Take as directed on package: Start with one 15mg  tablet by mouth twice a day with food. On Day 22, switch to one 20mg  tablet once a day with food. 51 each 0  . Krill Oil 350 MG CAPS Take 350 mg by mouth 2 (two) times daily.     No current facility-administered medications for this visit.     Review of Systems  Constitutional:  Constitutional negative. HENT: HENT negative.  Eyes: Eyes negative.  Respiratory: Respiratory negative.  GI: Gastrointestinal negative.  Musculoskeletal: Musculoskeletal negative.  Skin: Skin negative.  Neurological: Neurological negative. Hematologic: Hematologic/lymphatic negative.  Psychiatric: Psychiatric negative.        Objective:  Objective   There were no vitals filed for this visit. There is no height or weight on file to calculate BMI.  Physical Exam  Constitutional: He appears well-developed.  HENT:  Head: Normocephalic.  Eyes: Pupils are equal, round, and reactive to light.  Neck: Normal range of motion.  Cardiovascular: Normal rate.  Pulses:      Popliteal pulses are 2+ on the right  side, and 2+ on the left side.       Dorsalis pedis pulses are 2+ on the right side, and 2+ on the left side.  Pulmonary/Chest: Effort normal.  Abdominal: Soft.  Musculoskeletal: Normal range of motion. He exhibits no edema or deformity.  Neurological: He is alert.  Skin: Skin is warm and dry.    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  The common femoral vein and saphenofemoral junction are fully compressible there is some recanalization of the femoral vein proximally.     Assessment/Plan:     57 year old male underwent left lower extremity intervention on extensive DVT that included lysis and balloon angioplasty from the IVC down to the level of the common femoral.  There was no stent placed at that time.  He now demonstrates occlusion of his femoral vein for most of it but the common femoral vein appears patent and he is without symptoms for the most part other than a few varicosities.  With this we will continue to watch him.  He still needs to get his colonoscopy at this time would be okay to stop Xarelto given that it has been 6 months and he needs to restart that as well as aspirin as soon as he can in the post procedure period.      Waynetta Sandy MD [facility]

## 2018-04-15 ENCOUNTER — Other Ambulatory Visit: Payer: Self-pay

## 2018-04-15 DIAGNOSIS — I82402 Acute embolism and thrombosis of unspecified deep veins of left lower extremity: Secondary | ICD-10-CM

## 2018-09-02 ENCOUNTER — Ambulatory Visit: Payer: Managed Care, Other (non HMO) | Admitting: Vascular Surgery

## 2018-09-02 ENCOUNTER — Ambulatory Visit (HOSPITAL_COMMUNITY)
Admission: RE | Admit: 2018-09-02 | Discharge: 2018-09-02 | Disposition: A | Discharge status: Home / Self Care | Payer: Managed Care, Other (non HMO) | Source: Ambulatory Visit | Attending: Family | Admitting: Family

## 2018-09-02 DIAGNOSIS — I82402 Acute embolism and thrombosis of unspecified deep veins of left lower extremity: Secondary | ICD-10-CM | POA: Insufficient documentation

## 2018-10-31 ENCOUNTER — Other Ambulatory Visit: Payer: Self-pay

## 2018-10-31 ENCOUNTER — Encounter: Payer: Self-pay | Admitting: Vascular Surgery

## 2018-10-31 ENCOUNTER — Ambulatory Visit (INDEPENDENT_AMBULATORY_CARE_PROVIDER_SITE_OTHER): Payer: Managed Care, Other (non HMO) | Admitting: Vascular Surgery

## 2018-10-31 VITALS — BP 140/83 | HR 87 | Resp 18 | Ht 76.0 in | Wt 215.0 lb

## 2018-10-31 DIAGNOSIS — I82412 Acute embolism and thrombosis of left femoral vein: Secondary | ICD-10-CM | POA: Diagnosis not present

## 2018-10-31 DIAGNOSIS — Z09 Encounter for follow-up examination after completed treatment for conditions other than malignant neoplasm: Secondary | ICD-10-CM | POA: Diagnosis not present

## 2018-10-31 NOTE — Progress Notes (Signed)
Patient ID: Brian Torres, male   DOB: [birth year], 57 y.o.   MRN: [id number]  Reason for Consult: Follow-up (6 month f/u )   Referred by Cari Caraway, MD  Subjective:     HPI:  Brian Torres is a 57 y.o. male with a history of extensive left lower extremity DVT that mostly resolved with lysis and percutaneous mechanical thrombectomy with balloon angioplasty but no stenting.  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  He wears his thigh-high compression religiously except for when he is at the beach.  He does have some swelling varicosities.  He continues to do well from a lower extremity standpoint but his father did recently pass away with his been a significant stressor in his life.  He remains on Xarelto with a history of 2 previous DVTs that is planned for lifelong.  Past Medical History:  Diagnosis Date  . Depression   . Diabetes mellitus without complication ([place])   . Dyslipidemia   . Erectile dysfunction   . Nicotine addiction    History reviewed. No pertinent family history. Past Surgical History:  Procedure Laterality Date  . LOWER EXTREMITY ANGIOGRAPHY N/A 10/18/2017   Procedure: LOWER EXTREMITY ANGIOGRAPHY - IVUS - LYSIS CATH;  Surgeon: Waynetta Sandy, MD;  Location: [facility];  Service: Cardiovascular;  Laterality: N/A;  . LOWER EXTREMITY ANGIOGRAPHY N/A 10/19/2017   Procedure: LOWER EXTREMITY ANGIOGRAPHY - LYSIS RECHECK;  Surgeon: Waynetta Sandy, MD;  Location: [facility];  Service: Cardiovascular;  Laterality: N/A;  . PERIPHERAL VASCULAR BALLOON ANGIOPLASTY Left 10/19/2017   Procedure: PERIPHERAL VASCULAR BALLOON ANGIOPLASTY;  Surgeon: Waynetta Sandy, MD;  Location: [facility];  Service: Cardiovascular;  Laterality: Left;  Multiple Lower extremity venous sites  . PERIPHERAL VASCULAR INTERVENTION Left 10/18/2017   Procedure: PERIPHERAL VASCULAR INTERVENTION;   Surgeon: Waynetta Sandy, MD;  Location: [facility];  Service: Cardiovascular;  Laterality: Left;    Short Social History:  Social History   Tobacco Use  . Smoking status: Current Every Day Smoker    Packs/day: 0.50    Types: Cigarettes  . Smokeless tobacco: Never Used  Substance Use Topics  . Alcohol use: No    Frequency: Never    No Known Allergies  Current Outpatient Medications  Medication Sig Dispense Refill  . aspirin 81 MG tablet Take 81 mg by mouth daily.    Marland Kitchen b complex vitamins tablet Take 1 tablet by mouth daily.    Marland Kitchen CINNAMON PO Take 1,000 mg by mouth 2 (two) times daily.    Marland Kitchen co-enzyme Q-10 30 MG capsule Take 30 mg by mouth 3 (three) times daily.    . fenofibrate (TRICOR) 48 MG tablet Take 48 mg by mouth daily.    Marland Kitchen glimepiride (AMARYL) 4 MG tablet Take 4 mg by mouth daily.    Marland Kitchen lisinopril (PRINIVIL,ZESTRIL) 10 MG tablet Take 10 mg by mouth daily.    . metFORMIN (GLUCOPHAGE) 1000 MG tablet Take 1,000 mg by mouth 2 (two) times daily.    . Multiple Vitamins-Minerals (MULTI FOR HIM 50+ PO) Take by mouth.    . pioglitazone (ACTOS) 45 MG tablet Take 45 mg by mouth daily.    . Rivaroxaban 15 & 20 MG TBPK Take as directed on package: Start with one 15mg  tablet by mouth twice a day with food. On Day 22, switch to one 20mg  tablet once a day with food. [place]  each 0  . Krill Oil 350 MG CAPS Take 350 mg by mouth 2 (two) times daily.    . Omega-3 Fatty Acids (FISH OIL) 1000 MG CAPS Take by mouth.     No current facility-administered medications for this visit.     Review of Systems  Constitutional:  Constitutional negative. HENT: HENT negative.  Eyes: Eyes negative.  Respiratory: Respiratory negative.  Cardiovascular: Positive for leg swelling.  GI: Gastrointestinal negative.  Musculoskeletal: Musculoskeletal negative.  Skin: Skin negative.  Neurological: Neurological negative. Hematologic: Hematologic/lymphatic negative.  Psychiatric: Psychiatric  negative.        Objective:  Objective   Vitals:   10/31/18 1334  BP: 140/83  Pulse: 87  Resp: 18  SpO2: 100%  Weight: 215 lb (97.5 kg)  Height: 6\' 4"  (1.93 m)   Body mass index is 26.17 kg/m.  Physical Exam Eyes:     Pupils: Pupils are equal, round, and reactive to light.  Neck:     Musculoskeletal: Normal range of motion.  Pulmonary:     Effort: Pulmonary effort is normal.  Abdominal:     General: Abdomen is flat.     Palpations: Abdomen is soft.  Musculoskeletal:        General: No swelling.     Comments: He has his thigh-high compression stockings on his left leg today  Skin:    General: Skin is warm and dry.  Neurological:     General: No focal deficit present.     Mental Status: He is alert.  Psychiatric:        Mood and Affect: Mood normal.        Behavior: Behavior normal.        Thought Content: Thought content normal.        Judgment: Judgment normal.     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  He has an obstructive process and possibly even a reflux process although we have not tested this.  From a duplex stand I would no longer test his femoral vein given that it is thrombosed and is unlikely to recanalized on its own.  We will get reflux testing in 1 year.  He will remain on Xarelto lifelong continue to wear his compression stockings and walk as tolerated and he is very good about all of these.  If he has any further issues we will be happy to see him sooner than 1 year     Waynetta Sandy MD [facility]

## 2018-12-03 DIAGNOSIS — C801 Malignant (primary) neoplasm, unspecified: Secondary | ICD-10-CM

## 2018-12-03 HISTORY — DX: Malignant (primary) neoplasm, unspecified: C80.1

## 2018-12-19 ENCOUNTER — Encounter (HOSPITAL_COMMUNITY): Payer: Self-pay

## 2018-12-19 ENCOUNTER — Other Ambulatory Visit: Payer: Self-pay

## 2018-12-19 ENCOUNTER — Inpatient Hospital Stay (HOSPITAL_COMMUNITY)
Admission: AD | Admit: 2018-12-19 | Discharge: 2018-12-23 | DRG: 435 | Disposition: A | Discharge status: Home / Self Care | Payer: Managed Care, Other (non HMO) | Attending: Family Medicine | Admitting: Family Medicine

## 2018-12-19 DIAGNOSIS — I82402 Acute embolism and thrombosis of unspecified deep veins of left lower extremity: Secondary | ICD-10-CM | POA: Diagnosis present

## 2018-12-19 DIAGNOSIS — R17 Unspecified jaundice: Secondary | ICD-10-CM | POA: Diagnosis not present

## 2018-12-19 DIAGNOSIS — K7200 Acute and subacute hepatic failure without coma: Secondary | ICD-10-CM

## 2018-12-19 DIAGNOSIS — Z720 Tobacco use: Secondary | ICD-10-CM

## 2018-12-19 DIAGNOSIS — K3189 Other diseases of stomach and duodenum: Secondary | ICD-10-CM | POA: Diagnosis present

## 2018-12-19 DIAGNOSIS — K8689 Other specified diseases of pancreas: Secondary | ICD-10-CM | POA: Diagnosis not present

## 2018-12-19 DIAGNOSIS — Z7982 Long term (current) use of aspirin: Secondary | ICD-10-CM

## 2018-12-19 DIAGNOSIS — Z7901 Long term (current) use of anticoagulants: Secondary | ICD-10-CM

## 2018-12-19 DIAGNOSIS — I10 Essential (primary) hypertension: Secondary | ICD-10-CM | POA: Diagnosis not present

## 2018-12-19 DIAGNOSIS — Z794 Long term (current) use of insulin: Secondary | ICD-10-CM

## 2018-12-19 DIAGNOSIS — D649 Anemia, unspecified: Secondary | ICD-10-CM | POA: Diagnosis present

## 2018-12-19 DIAGNOSIS — I129 Hypertensive chronic kidney disease with stage 1 through stage 4 chronic kidney disease, or unspecified chronic kidney disease: Secondary | ICD-10-CM | POA: Diagnosis present

## 2018-12-19 DIAGNOSIS — E876 Hypokalemia: Secondary | ICD-10-CM | POA: Diagnosis present

## 2018-12-19 DIAGNOSIS — E119 Type 2 diabetes mellitus without complications: Secondary | ICD-10-CM | POA: Diagnosis present

## 2018-12-19 DIAGNOSIS — D631 Anemia in chronic kidney disease: Secondary | ICD-10-CM | POA: Diagnosis present

## 2018-12-19 DIAGNOSIS — E871 Hypo-osmolality and hyponatremia: Secondary | ICD-10-CM | POA: Diagnosis not present

## 2018-12-19 DIAGNOSIS — R7989 Other specified abnormal findings of blood chemistry: Secondary | ICD-10-CM | POA: Diagnosis present

## 2018-12-19 DIAGNOSIS — K831 Obstruction of bile duct: Secondary | ICD-10-CM | POA: Diagnosis present

## 2018-12-19 DIAGNOSIS — N183 Chronic kidney disease, stage 3 (moderate): Secondary | ICD-10-CM | POA: Diagnosis not present

## 2018-12-19 DIAGNOSIS — M7989 Other specified soft tissue disorders: Secondary | ICD-10-CM | POA: Diagnosis present

## 2018-12-19 DIAGNOSIS — N529 Male erectile dysfunction, unspecified: Secondary | ICD-10-CM | POA: Diagnosis present

## 2018-12-19 DIAGNOSIS — E1122 Type 2 diabetes mellitus with diabetic chronic kidney disease: Secondary | ICD-10-CM | POA: Diagnosis not present

## 2018-12-19 DIAGNOSIS — C259 Malignant neoplasm of pancreas, unspecified: Principal | ICD-10-CM | POA: Diagnosis present

## 2018-12-19 DIAGNOSIS — E785 Hyperlipidemia, unspecified: Secondary | ICD-10-CM | POA: Diagnosis present

## 2018-12-19 DIAGNOSIS — F17210 Nicotine dependence, cigarettes, uncomplicated: Secondary | ICD-10-CM | POA: Diagnosis present

## 2018-12-19 DIAGNOSIS — E1165 Type 2 diabetes mellitus with hyperglycemia: Secondary | ICD-10-CM | POA: Diagnosis present

## 2018-12-19 DIAGNOSIS — K2980 Duodenitis without bleeding: Secondary | ICD-10-CM | POA: Diagnosis present

## 2018-12-19 DIAGNOSIS — R945 Abnormal results of liver function studies: Secondary | ICD-10-CM | POA: Diagnosis not present

## 2018-12-19 DIAGNOSIS — N179 Acute kidney failure, unspecified: Secondary | ICD-10-CM | POA: Diagnosis present

## 2018-12-19 DIAGNOSIS — Z8249 Family history of ischemic heart disease and other diseases of the circulatory system: Secondary | ICD-10-CM

## 2018-12-19 DIAGNOSIS — E8770 Fluid overload, unspecified: Secondary | ICD-10-CM | POA: Diagnosis present

## 2018-12-19 DIAGNOSIS — K921 Melena: Secondary | ICD-10-CM | POA: Diagnosis present

## 2018-12-19 DIAGNOSIS — I82502 Chronic embolism and thrombosis of unspecified deep veins of left lower extremity: Secondary | ICD-10-CM | POA: Diagnosis present

## 2018-12-19 DIAGNOSIS — I82409 Acute embolism and thrombosis of unspecified deep veins of unspecified lower extremity: Secondary | ICD-10-CM | POA: Diagnosis present

## 2018-12-19 DIAGNOSIS — F329 Major depressive disorder, single episode, unspecified: Secondary | ICD-10-CM | POA: Diagnosis present

## 2018-12-19 DIAGNOSIS — D62 Acute posthemorrhagic anemia: Secondary | ICD-10-CM | POA: Diagnosis present

## 2018-12-19 DIAGNOSIS — K7290 Hepatic failure, unspecified without coma: Secondary | ICD-10-CM

## 2018-12-19 DIAGNOSIS — Z833 Family history of diabetes mellitus: Secondary | ICD-10-CM

## 2018-12-19 DIAGNOSIS — Z79899 Other long term (current) drug therapy: Secondary | ICD-10-CM

## 2018-12-19 LAB — GLUCOSE, CAPILLARY: Glucose-Capillary: 350 mg/dL — ABNORMAL HIGH (ref 70–99)

## 2018-12-19 LAB — CBC WITH DIFFERENTIAL/PLATELET
Abs Immature Granulocytes: 0.04 10*3/uL (ref 0.00–0.07)
BASOS ABS: 0 10*3/uL (ref 0.0–0.1)
Basophils Relative: 0 %
EOS PCT: 2 %
Eosinophils Absolute: 0.1 10*3/uL (ref 0.0–0.5)
HCT: 24 % — ABNORMAL LOW (ref 39.0–52.0)
Hemoglobin: 7.3 g/dL — ABNORMAL LOW (ref 13.0–17.0)
Immature Granulocytes: 1 %
LYMPHS PCT: 23 %
Lymphs Abs: 1.1 10*3/uL (ref 0.7–4.0)
MCH: 31.7 pg (ref 26.0–34.0)
MCHC: 30.4 g/dL (ref 30.0–36.0)
MCV: 104.3 fL — ABNORMAL HIGH (ref 80.0–100.0)
Monocytes Absolute: 0.5 10*3/uL (ref 0.1–1.0)
Monocytes Relative: 11 %
Neutro Abs: 2.9 10*3/uL (ref 1.7–7.7)
Neutrophils Relative %: 63 %
Platelets: 313 10*3/uL (ref 150–400)
RBC: 2.3 MIL/uL — ABNORMAL LOW (ref 4.22–5.81)
RDW: 16.5 % — ABNORMAL HIGH (ref 11.5–15.5)
WBC: 4.6 10*3/uL (ref 4.0–10.5)
nRBC: 0 % (ref 0.0–0.2)

## 2018-12-19 LAB — COMPREHENSIVE METABOLIC PANEL
ALBUMIN: 2.5 g/dL — AB (ref 3.5–5.0)
ALT: 438 U/L — ABNORMAL HIGH (ref 0–44)
AST: 282 U/L — ABNORMAL HIGH (ref 15–41)
Alkaline Phosphatase: 1006 U/L — ABNORMAL HIGH (ref 38–126)
Anion gap: 7 (ref 5–15)
BILIRUBIN TOTAL: 21.4 mg/dL — AB (ref 0.3–1.2)
BUN: 14 mg/dL (ref 6–20)
CO2: 22 mmol/L (ref 22–32)
Calcium: 8.2 mg/dL — ABNORMAL LOW (ref 8.9–10.3)
Chloride: 97 mmol/L — ABNORMAL LOW (ref 98–111)
Creatinine, Ser: 0.92 mg/dL (ref 0.61–1.24)
GFR calc Af Amer: >60 mL/min (ref >60)
GFR calc non Af Amer: >60 mL/min (ref >60)
GLUCOSE: 400 mg/dL — AB (ref 70–99)
Potassium: 4.3 mmol/L (ref 3.5–5.1)
Sodium: 126 mmol/L — ABNORMAL LOW (ref 135–145)
Total Protein: 5.5 g/dL — ABNORMAL LOW (ref 6.5–8.1)

## 2018-12-19 LAB — AMMONIA: Ammonia: 54 umol/L — ABNORMAL HIGH (ref 9–35)

## 2018-12-19 LAB — MAGNESIUM: MAGNESIUM: 1.8 mg/dL (ref 1.7–2.4)

## 2018-12-19 LAB — PHOSPHORUS: Phosphorus: 3.5 mg/dL (ref 2.5–4.6)

## 2018-12-19 LAB — PROTIME-INR
INR: 1.47
Prothrombin Time: 17.6 seconds — ABNORMAL HIGH (ref 11.4–15.2)

## 2018-12-19 LAB — TSH: TSH: 2.327 u[IU]/mL (ref 0.350–4.500)

## 2018-12-19 LAB — LIPASE, BLOOD: Lipase: 118 U/L — ABNORMAL HIGH (ref 11–51)

## 2018-12-19 LAB — ACETAMINOPHEN LEVEL: Acetaminophen (Tylenol), Serum: <10 ug/mL — ABNORMAL LOW (ref 10–30)

## 2018-12-19 LAB — SODIUM, URINE, RANDOM: Sodium, Ur: 56 mmol/L

## 2018-12-19 LAB — CREATININE, URINE, RANDOM: Creatinine, Urine: 53.47 mg/dL

## 2018-12-19 MED ORDER — SODIUM CHLORIDE 0.9% FLUSH: 3.0000 mL | INTRAVENOUS | Status: DC | PRN

## 2018-12-19 MED ORDER — INSULIN GLARGINE 100 UNIT/ML ~~LOC~~ SOLN
10.0000 [IU] | Freq: Every day | SUBCUTANEOUS | Status: DC
  Administered 2018-12-19 – 2018-12-21 (×3): 10 [IU] via SUBCUTANEOUS
  Filled 2018-12-19 (×3): qty 0.1

## 2018-12-19 MED ORDER — FENOFIBRATE 54 MG PO TABS
54.0000 mg | ORAL_TABLET | Freq: Every day | ORAL | Status: DC
  Administered 2018-12-20: 54 mg via ORAL
  Filled 2018-12-19 (×2): qty 1

## 2018-12-19 MED ORDER — SODIUM CHLORIDE 0.9% FLUSH
3.0000 mL | Freq: Two times a day (BID) | INTRAVENOUS | Status: DC
  Administered 2018-12-20 – 2018-12-22 (×5): 3 mL via INTRAVENOUS

## 2018-12-19 MED ORDER — ACETAMINOPHEN 650 MG RE SUPP: 650.0000 mg | Freq: Four times a day (QID) | RECTAL | Status: DC | PRN

## 2018-12-19 MED ORDER — MORPHINE SULFATE (PF) 2 MG/ML IV SOLN: 1.0000 mg | INTRAVENOUS | Status: DC | PRN

## 2018-12-19 MED ORDER — ONDANSETRON HCL 4 MG PO TABS: 4.0000 mg | ORAL_TABLET | Freq: Four times a day (QID) | ORAL | Status: DC | PRN

## 2018-12-19 MED ORDER — NICOTINE 21 MG/24HR TD PT24
21.0000 mg | MEDICATED_PATCH | Freq: Every day | TRANSDERMAL | Status: DC
  Administered 2018-12-19 – 2018-12-23 (×5): 21 mg via TRANSDERMAL
  Filled 2018-12-19 (×5): qty 1

## 2018-12-19 MED ORDER — ONDANSETRON HCL 4 MG/2ML IJ SOLN
4.0000 mg | Freq: Four times a day (QID) | INTRAMUSCULAR | Status: DC | PRN
  Administered 2018-12-21: 4 mg via INTRAVENOUS

## 2018-12-19 MED ORDER — INSULIN ASPART 100 UNIT/ML ~~LOC~~ SOLN
0.0000 [IU] | SUBCUTANEOUS | Status: DC
  Administered 2018-12-19: 7 [IU] via SUBCUTANEOUS
  Administered 2018-12-20: 2 [IU] via SUBCUTANEOUS
  Administered 2018-12-20: 7 [IU] via SUBCUTANEOUS
  Administered 2018-12-20: 2 [IU] via SUBCUTANEOUS
  Administered 2018-12-21: 7 [IU] via SUBCUTANEOUS
  Administered 2018-12-21: 2 [IU] via SUBCUTANEOUS
  Administered 2018-12-21: 3 [IU] via SUBCUTANEOUS
  Administered 2018-12-22: 7 [IU] via SUBCUTANEOUS

## 2018-12-19 MED ORDER — ACETAMINOPHEN 325 MG PO TABS: 650.0000 mg | ORAL_TABLET | Freq: Four times a day (QID) | ORAL | Status: DC | PRN

## 2018-12-19 MED ORDER — HYDROCODONE-ACETAMINOPHEN 5-325 MG PO TABS: 1.0000 | ORAL_TABLET | ORAL | Status: DC | PRN

## 2018-12-19 MED ORDER — SODIUM CHLORIDE 0.9 % IV SOLN: 250.0000 mL | INTRAVENOUS | Status: DC | PRN

## 2018-12-19 NOTE — H&P (View-Only) (Signed)
Brian BARCELLOS Torres:[id number] DOB: 1961-05-23 DOA: 12/19/2018     PCP: Cari Caraway, MD   Outpatient Specialists:    Vascular surgery Dr. Kasandra Knudsen Patient arrived to ER on  at   Patient coming from: home Lives   With family    Chief Complaint: abnormal labs HPI: Brian Torres is a 58 y.o. male with medical history significant of  Left DVT in 2017 and 2018, DM 2, HTN, CKD stage 3, Anemia,      Presented with jaundice and new findings of biliary obstruction likely by pancreatic lesion. Patient reports that he lost weight over past 1 month, turning yellow for the past few weeks, no abdominal pain.  No fevers or chills no chest pain or shortness of breath He has been told in the past to get colonoscopy but has not His urine has been getting dark and stool light and floating  He still smokes drinks alcohol rarely  For the past 2 weeks he has taken Acetaminophen 539m BID [place] to swell. He was worried bc of past hx of DVT and [facility]  He was found to have hyponatremia Na 124, Alk Phosph 1122, AST 361, ALT 518 total bilirubin of 18 albumin of 2.0   bilirubin  Direct 14  lipase 198 INR 2.0  HG 8.4 plt 318  CT bd showed : Moderate to market intrahepatic biliary ductal dilatation marked extrahepatic biliary ductal dilatation with extrahepatic common bile duct measuring up to 2.6 cm in diameter extrahepatic common bile duct tapers at the level pancreatic head multiple cystic masses seen at the pancreatic distal body and tail largest measuring up to 3 cm gallbladder is not hydropic   Overall findings worrisome for pancreatic cancer with biliary duct obstruction  Regarding pertinent Chronic problems:    hx of DVT initially was treated with 7 months of Xarelto and then discontinued. 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 Comprehensive metabolic panel  . Protime-INR  . CBC with Differential/Platelet  . Ammonia  . Hepatitis panel, acute  . TSH  . Lipid panel  . Acetaminophen level  . Hemoglobin A1c  . Creatinine, urine, random  . Sodium, urine, random  . Osmolality, urine  . Osmolality  . Vitamin B12  . Folate  . Iron and TIBC  . Ferritin  . Reticulocytes  . STAT CBG when hypoglycemia is suspected. If treated, recheck every 15 minutes after each treatment until CBG >/= 70 mg/dl  . Refer to Hypoglycemia Protocol Sidebar Report for treatment of CBG < 70 mg/dl  . No basal insulin at this time  . Place and maintain sequential compression device  . Type and screen [id number]   Significant initial  Findings: Abnormal Labs Reviewed  PROTIME-INR - Abnormal; Notable for the following components:      Result Value   Prothrombin Time 17.6 (*)    All other components within normal limits  CBC WITH DIFFERENTIAL/PLATELET - Abnormal; Notable for the following components:   RBC 2.30 (*)    Hemoglobin 7.3 (*)    HCT 24.0 (*)  MCV 104.3 (*)    RDW 16.5 (*)    All other components within normal limits      Cr   Lab Results  Component Value Date   CREATININE 1.15 10/20/2017   CREATININE 1.09 10/19/2017   CREATININE 1.20 10/18/2017        HG/HCT    Down  from baseline see below    Component Value Date/Time   HGB 10.4 (L) 10/20/2017 0246   HCT 30.4 (L) 10/20/2017 0246     ECG: not obtained     ED Triage Vitals  Enc Vitals Group     BP 12/19/18 2045 115/62     Pulse Rate 12/19/18 2045 91     Resp 12/19/18 2045 17     Temp 12/19/18 2045 98.2 F (36.8 C)     Temp Source 12/19/18 2045 Oral     SpO2 12/19/18 2045 100 %     Weight --      Height 12/19/18 2031  _0  (1.93 m)     Head Circumference --      Peak Flow --      Pain Score 12/19/18 2031 0     Pain Loc --      Pain Edu? --      Excl. in [place]? --   TMAX(24)@       Latest  Blood pressure 115/62, pulse 91, temperature 98.2 F (36.8 C), temperature source Oral, resp. rate 17, height _1  (1.93 m), SpO2 100 %.    Hospitalist was called for admission for further work-up of pancreatic mass and jaundice   Review of Systems:    Pertinent positives include: weight loss, fatigue,  change in color of urine  Constitutional:  No weight loss, night sweats, Fevers, chills,    HEENT:  No headaches, Difficulty swallowing,Tooth/dental problems,Sore throat,  No sneezing, itching, ear ache, nasal congestion, post nasal drip,  Cardio-vascular:  No chest pain, Orthopnea, PND, anasarca, dizziness, palpitations.no Bilateral lower extremity swelling  GI:  No heartburn, indigestion, abdominal pain, nausea, vomiting, diarrhea, change in bowel habits, loss of appetite, melena, blood in stool, hematemesis Resp:  no shortness of breath at rest. No dyspnea on exertion, No excess mucus, no productive cough, No non-productive cough, No coughing up of blood.No change in color of mucus.No wheezing. Skin:  no rash or lesions. No jaundice GU:  no dysuria,, no urgency or frequency. No straining to urinate.  No flank pain.  Musculoskeletal:  No joint pain or no joint swelling. No decreased range of motion. No back pain.  Psych:  No change in mood or affect. No depression or anxiety. No memory loss.  Neuro: no localizing neurological complaints, no tingling, no weakness, no double vision, no gait abnormality, no slurred speech, no confusion  All systems reviewed and apart from [place] all are negative  Past Medical History:   Past Medical History:  Diagnosis Date  . Depression   . Diabetes mellitus without complication ([place])   . Dyslipidemia   . Erectile dysfunction   . Nicotine addiction       Past  Surgical History:  Procedure Laterality Date  . LOWER EXTREMITY ANGIOGRAPHY N/A 10/18/2017   Procedure: LOWER EXTREMITY ANGIOGRAPHY - IVUS - LYSIS CATH;  Surgeon: Waynetta Sandy, MD;  Location: [place] [facility];  Service: Cardiovascular;  Laterality: N/A;  . LOWER EXTREMITY ANGIOGRAPHY N/A 10/19/2017   Procedure: LOWER EXTREMITY ANGIOGRAPHY - LYSIS RECHECK;  Surgeon: Waynetta Sandy, MD;  Location: [place]  [facility];  Service: Cardiovascular;  Laterality: N/A;  . PERIPHERAL VASCULAR BALLOON ANGIOPLASTY Left 10/19/2017   Procedure: PERIPHERAL VASCULAR BALLOON ANGIOPLASTY;  Surgeon: Waynetta Sandy, MD;  Location: [place] [facility];  Service: Cardiovascular;  Laterality: Left;  Multiple Lower extremity venous sites  . PERIPHERAL VASCULAR INTERVENTION Left 10/18/2017   Procedure: PERIPHERAL VASCULAR INTERVENTION;  Surgeon: Waynetta Sandy, MD;  Location: [place] [facility];  Service: Cardiovascular;  Laterality: Left;    Social History:  Ambulatory  independently     reports that he has been smoking cigarettes. He has been smoking about 0.50 packs per day. He has never used smokeless tobacco. He reports that he does not drink alcohol or use drugs.     Family History:   Family History  Problem Relation Age of Onset  . Diabetes Mother   . Diabetes Father   . CAD Father   . Cancer Neg Hx     Allergies: No Known Allergies   Prior to Admission medications   Medication Sig Start Date End Date Taking? Authorizing Provider  acetaminophen (TYLENOL) 500 MG tablet Take 500 mg by mouth 2 (two) times daily as needed for mild pain.   Yes [provider]  aspirin 81 MG tablet Take 81 mg by mouth daily.   Yes [provider]  b complex vitamins tablet Take 1 tablet by mouth daily.   Yes [provider]  CINNAMON PO Take 1,000 mg by mouth 2 (two) times daily.   Yes [provider]  co-enzyme Q-10 30 MG capsule Take 30  mg by mouth 3 (three) times daily.   Yes [provider]  fenofibrate (TRICOR) 48 MG tablet Take 48 mg by mouth daily. 10/13/17  Yes [provider]  ferrous sulfate 325 (65 FE) MG EC tablet Take 325 mg by mouth daily with breakfast.   Yes [provider]  glimepiride (AMARYL) 4 MG tablet Take 4 mg by mouth daily. 10/13/17  Yes [provider]  Insulin Glargine (BASAGLAR KWIKPEN) 100 UNIT/ML SOPN Inject 16 Units into the skin at bedtime. 10/19/18  Yes [provider]  Javier Docker Oil 1000 MG CAPS Take 1,000 mg by mouth 2 (two) times daily.    Yes [provider]  lisinopril (PRINIVIL,ZESTRIL) 10 MG tablet Take 10 mg by mouth daily. 10/13/17  Yes [provider]  metFORMIN (GLUCOPHAGE) 1000 MG tablet Take 1,000 mg by mouth 2 (two) times daily. 10/13/17  Yes [provider]  Multiple Vitamins-Minerals (MULTI FOR HIM 50+ PO) Take by mouth.   Yes [provider]  Omega-3 Fatty Acids (FISH OIL) 1000 MG CAPS Take by mouth.   Yes [provider]  pioglitazone (ACTOS) 45 MG tablet Take 45 mg by mouth daily. 10/13/17  Yes [provider]  rosuvastatin (CRESTOR) 5 MG tablet Take 5 mg by mouth daily.   Yes [provider]  sildenafil (REVATIO) 20 MG tablet Take 60-100 mg by mouth daily as needed (erectile dysfunction).   Yes [provider]  XARELTO 20 MG TABS tablet Take 20 mg by mouth daily. 08/11/18  Yes [provider]  Rivaroxaban 15 & 20 MG TBPK Take as directed on package: Start with one 42m tablet by mouth twice a day with food. On Day 22, switch to one 232mtablet once a day with food. Patient not taking: Reported on 12/19/2018 10/21/17   CaWaynetta SandyMD   Physical Exam: Blood pressure 115/62, pulse 91, temperature 98.2 F (  36.8 C), temperature source Oral, resp. rate 17, height _0  (1.93 m), SpO2 100 %. 1. General:  in No Acute distress    Chronically ill  -appearing 2. Psychological: Alert and   Oriented 3. Head/ENT:    Dry Mucous Membranes                          Head Non traumatic, neck supple                            Poor Dentition 4. SKIN:   decreased Skin turgor,  Skin clean Dry and intact no rash 5. Heart: Regular rate and rhythm no  Murmur, no Rub or gallop 6. Lungs:   no wheezes or crackles   7. Abdomen: Soft,  non-tender, Non distended  bowel sounds present 8. Lower extremities: no clubbing, cyanosis, trace edema 9. Neurologically Grossly intact, moving all 4 extremities equally   10. MSK: Normal range of motion   LABS:     Recent Labs  Lab 12/19/18 2153  WBC 4.6  NEUTROABS 2.9  HGB 7.3*  HCT 24.0*  MCV 104.3*  PLT 696   Basic Metabolic Panel: Recent Labs  Lab 12/19/18 2153  NA 126*  K 4.3  CL 97*  CO2 22  GLUCOSE 400*  BUN 14  CREATININE 0.92  CALCIUM 8.2*      Recent Labs  Lab 12/19/18 2153  AST 282*  ALT 438*  ALKPHOS 1,006*  BILITOT 21.4*  PROT 5.5*  ALBUMIN 2.5*   No results for input(s): LIPASE, AMYLASE in the last 168 hours. Recent Labs  Lab 12/19/18 2153  AMMONIA 54*         Radiological Exams on Admission: No results found.  Chart has been reviewed    Assessment/Plan  58 y.o. male with medical history significant of  Left DVT in 2017 and 2018, DM 2, HTN, CKD stage 3, Anemia,    Admitted for elevated LFT'S and biliary obstruction due to pancreatic lesion  Present on Admission: . Pancreatic mass -with biliary obstruction elevated total bilirubin and LFTs.  Most likely secondary to pancreatic malignancy.  But will need further diagnostics.  Discussed with GI Dr. Cristina Gong patient will be seen tomorrow in consult.  Decide tomorrow if he would need MRI/MRCP versus proceed directly to ERCP.  Just in case will make n.p.o. post midnight but if no ERCP scheduled for tomorrow can feed.  Marland Kitchen HTN (hypertension) given soft blood pressures we will allow for some permissive hypertension for  tonight . CKD stage 3 due to type 2 diabetes mellitus ([place]) currently stable continue to monitor avoid nephrotoxic medications . Leg DVT (deep venous thromboembolism), acute, left (HCC) patient on chronic anticoagulation will hold Xarelto for now as patient will likely require procedures done.  Will bridge with heparin as patient is high risk for developing recurrent DVTs . Normocytic anemia patient has history of anemia with downtrending hemoglobin.  Now down to 7.3 currently no chest pain or shortness of breath obtain type and screen may need blood transfusion if continues to go down or prior to procedure  . Tobacco abuse -  - Spoke about importance of quitting spent 5 minutes discussing options for treatment, prior attempts at quitting, and dangers of smoking  -At this point patient is   NOT  interested in quitting  - order nicotine patch   - nursing tobacco cessation protocol  . Elevated  LFTs likely postobstructive will likely require stenting to relieve obstruction we will also obtain hepatitis serologies for completion and acetaminophen level  DM 2-  - Order Sensitive SSI   - continue home insulin regimen Glargin decrease to 10 units,  -  check TSH and HgA1C  - Hold by mouth medications   hypoNatremia in the setting of fluid overload and liver disease and electrolytes will need further imaging for staging evaluation including CT chest routine  Other plan as per orders.  DVT prophylaxis:  SCD   Code Status:  FULL CODE   as per patient  I had personally discussed CODE STATUS with patient   Family Communication:   Family   at  Bedside  plan of care was discussed with   Wife,    Disposition Plan:       To home once workup is complete and patient is stable                     Consults called: [facility]   Admission status:   inpatient     Expect 2 midnight stay secondary to severity of patient's current illness including    Severe lab/radiological/exam abnormalities including:  Liver functions elevated   and extensive comorbidities including:  DM2      CKD    liver disease . Suspected malignancy . Chronic anticoagulation  That are currently affecting medical management.   I expect  patient to be hospitalized for 2 midnights requiring inpatient medical care.  Patient is at high risk for adverse outcome (such as loss of life or disability) if not treated.  Indication for inpatient stay as follows:      inability to maintain oral hydration    Need for operative/procedural  intervention   Need for  IV fluids,  IV pain medications IV anticoagulation    Level of care     medical floor              Toy Baker 12/19/2018, 11:22 PM    [facility]     after 2 AM please page floor coverage PA If 7AM-7PM, please contact the day team taking care of the patient using [email or web address]

## 2018-12-19 NOTE — Progress Notes (Signed)
ANTICOAGULATION CONSULT NOTE - Initial Consult  Pharmacy Consult for IV heparin ( on PTA xarelto) Indication: DVT  No Known Allergies  Patient Measurements: Height: 6\' 4"  (193 cm) IBW/kg (Calculated) : 86.8 Heparin Dosing Weight: actual (97 kg)  Vital Signs: Temp: 98.2 F (36.8 C) (02/17 2045) Temp Source: Oral (02/17 2045) BP: 115/62 (02/17 2045) Pulse Rate: 91 (02/17 2045)  Labs: Recent Labs    12/19/18 2153  HGB 7.3*  HCT 24.0*  PLT 313  LABPROT 17.6*  INR 1.47    CrCl cannot be calculated (Patient's most recent lab result is older than the maximum 21 days allowed.).   Medical History: Past Medical History:  Diagnosis Date  . Depression   . Diabetes mellitus without complication ([place])   . Dyslipidemia   . Erectile dysfunction   . Nicotine addiction      Assessment: 77 yoM with hx of left DVT in 2017 and 2018 on xarelto. LD xarelto 2/17 10 am. Baseline labs: H/H=7.3/24 Plts=313 Aptt=33 HL=0.53 INR=1.47  Goal of Therapy:  aptt 66-102 Heparin level 0.3-0.7 units/ml Monitor platelets by anticoagulation protocol: Yes   Plan:  Start heparin drip at 1600 units/hr at 10 am today (24 hours after last xarelto dose) No bolus Daily CBC/HL/aptt Check 1st aptt 6 hours after drip started ~ 1600.   Dorrene German 12/19/2018,11:11 PM

## 2018-12-19 NOTE — Progress Notes (Signed)
CRITICAL VALUE ALERT  Critical Value: Total bilirubin 21.4   Date & Time Notied:  12/19/2018 2312  Provider Notified: Harlin Heys   Orders Received/Actions taken: MD paged

## 2018-12-19 NOTE — H&P (Signed)
Brian BARCELLOS YPP:[id number] DOB: 1961-05-23 DOA: 12/19/2018     PCP: Cari Caraway, MD   Outpatient Specialists:    Vascular surgery Dr. Kasandra Knudsen Patient arrived to ER on  at   Patient coming from: home Lives   With family    Chief Complaint: abnormal labs HPI: Brian Torres is a 58 y.o. male with medical history significant of  Left DVT in 2017 and 2018, DM 2, HTN, CKD stage 3, Anemia,      Presented with jaundice and new findings of biliary obstruction likely by pancreatic lesion. Patient reports that he lost weight over past 1 month, turning yellow for the past few weeks, no abdominal pain.  No fevers or chills no chest pain or shortness of breath He has been told in the past to get colonoscopy but has not His urine has been getting dark and stool light and floating  He still smokes drinks alcohol rarely  For the past 2 weeks he has taken Acetaminophen 539m BID He [place] to swell. He was worried bc of past hx of DVT and [facility]  He was found to have hyponatremia Na 124, Alk Phosph 1122, AST 361, ALT 518 total bilirubin of 18 albumin of 2.0   bilirubin  Direct 14  lipase 198 INR 2.0  HG 8.4 plt 318  CT bd showed : Moderate to market intrahepatic biliary ductal dilatation marked extrahepatic biliary ductal dilatation with extrahepatic common bile duct measuring up to 2.6 cm in diameter extrahepatic common bile duct tapers at the level pancreatic head multiple cystic masses seen at the pancreatic distal body and tail largest measuring up to 3 cm gallbladder is not hydropic   Overall findings worrisome for pancreatic cancer with biliary duct obstruction  Regarding pertinent Chronic problems:    hx of DVT initially was treated with 7 months of Xarelto and then discontinued. 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 Comprehensive metabolic panel  . Protime-INR  . CBC with Differential/Platelet  . Ammonia  . Hepatitis panel, acute  . TSH  . Lipid panel  . Acetaminophen level  . Hemoglobin A1c  . Creatinine, urine, random  . Sodium, urine, random  . Osmolality, urine  . Osmolality  . Vitamin B12  . Folate  . Iron and TIBC  . Ferritin  . Reticulocytes  . STAT CBG when hypoglycemia is suspected. If treated, recheck every 15 minutes after each treatment until CBG >/= 70 mg/dl  . Refer to Hypoglycemia Protocol Sidebar Report for treatment of CBG < 70 mg/dl  . No basal insulin at this time  . Place and maintain sequential compression device  . Type and screen [place]   Significant initial  Findings: Abnormal Labs Reviewed  PROTIME-INR - Abnormal; Notable for the following components:      Result Value   Prothrombin Time 17.6 (*)    All other components within normal limits  CBC WITH DIFFERENTIAL/PLATELET - Abnormal; Notable for the following components:   RBC 2.30 (*)    Hemoglobin 7.3 (*)    HCT 24.0 (*)  MCV 104.3 (*)    RDW 16.5 (*)    All other components within normal limits      Cr   Lab Results  Component Value Date   CREATININE 1.15 10/20/2017   CREATININE 1.09 10/19/2017   CREATININE 1.20 10/18/2017        HG/HCT    Down  from baseline see below    Component Value Date/Time   HGB 10.4 (L) 10/20/2017 0246   HCT 30.4 (L) 10/20/2017 0246     ECG: not obtained     ED Triage Vitals  Enc Vitals Group     BP 12/19/18 2045 115/62     Pulse Rate 12/19/18 2045 91     Resp 12/19/18 2045 17     Temp 12/19/18 2045 98.2 F (36.8 C)     Temp Source 12/19/18 2045 Oral     SpO2 12/19/18 2045 100 %     Weight --      Height 12/19/18 2031  _0  (1.93 m)     Head Circumference --      Peak Flow --      Pain Score 12/19/18 2031 0     Pain Loc --      Pain Edu? --      Excl. in [place]? --   TMAX(24)@       Latest  Blood pressure 115/62, pulse 91, temperature 98.2 F (36.8 C), temperature source Oral, resp. rate 17, height _1  (1.93 m), SpO2 100 %.    Hospitalist was called for admission for further work-up of pancreatic mass and jaundice   Review of Systems:    Pertinent positives include: weight loss, fatigue,  change in color of urine  Constitutional:  No weight loss, night sweats, Fevers, chills,    HEENT:  No headaches, Difficulty swallowing,Tooth/dental problems,Sore throat,  No sneezing, itching, ear ache, nasal congestion, post nasal drip,  Cardio-vascular:  No chest pain, Orthopnea, PND, anasarca, dizziness, palpitations.no Bilateral lower extremity swelling  GI:  No heartburn, indigestion, abdominal pain, nausea, vomiting, diarrhea, change in bowel habits, loss of appetite, melena, blood in stool, hematemesis Resp:  no shortness of breath at rest. No dyspnea on exertion, No excess mucus, no productive cough, No non-productive cough, No coughing up of blood.No change in color of mucus.No wheezing. Skin:  no rash or lesions. No jaundice GU:  no dysuria,, no urgency or frequency. No straining to urinate.  No flank pain.  Musculoskeletal:  No joint pain or no joint swelling. No decreased range of motion. No back pain.  Psych:  No change in mood or affect. No depression or anxiety. No memory loss.  Neuro: no localizing neurological complaints, no tingling, no weakness, no double vision, no gait abnormality, no slurred speech, no confusion  All systems reviewed and apart from [place] all are negative  Past Medical History:   Past Medical History:  Diagnosis Date  . Depression   . Diabetes mellitus without complication ([place])   . Dyslipidemia   . Erectile dysfunction   . Nicotine addiction       Past  Surgical History:  Procedure Laterality Date  . LOWER EXTREMITY ANGIOGRAPHY N/A 10/18/2017   Procedure: LOWER EXTREMITY ANGIOGRAPHY - IVUS - LYSIS CATH;  Surgeon: Waynetta Sandy, MD;  Location: [place] [facility];  Service: Cardiovascular;  Laterality: N/A;  . LOWER EXTREMITY ANGIOGRAPHY N/A 10/19/2017   Procedure: LOWER EXTREMITY ANGIOGRAPHY - LYSIS RECHECK;  Surgeon: Waynetta Sandy, MD;  Location: [place]  [facility];  Service: Cardiovascular;  Laterality: N/A;  . PERIPHERAL VASCULAR BALLOON ANGIOPLASTY Left 10/19/2017   Procedure: PERIPHERAL VASCULAR BALLOON ANGIOPLASTY;  Surgeon: Waynetta Sandy, MD;  Location: [place] [facility];  Service: Cardiovascular;  Laterality: Left;  Multiple Lower extremity venous sites  . PERIPHERAL VASCULAR INTERVENTION Left 10/18/2017   Procedure: PERIPHERAL VASCULAR INTERVENTION;  Surgeon: Waynetta Sandy, MD;  Location: [place] [facility];  Service: Cardiovascular;  Laterality: Left;    Social History:  Ambulatory  independently     reports that he has been smoking cigarettes. He has been smoking about 0.50 packs per day. He has never used smokeless tobacco. He reports that he does not drink alcohol or use drugs.     Family History:   Family History  Problem Relation Age of Onset  . Diabetes Mother   . Diabetes Father   . CAD Father   . Cancer Neg Hx     Allergies: No Known Allergies   Prior to Admission medications   Medication Sig Start Date End Date Taking? Authorizing Provider  acetaminophen (TYLENOL) 500 MG tablet Take 500 mg by mouth 2 (two) times daily as needed for mild pain.   Yes [provider]  aspirin 81 MG tablet Take 81 mg by mouth daily.   Yes [provider]  b complex vitamins tablet Take 1 tablet by mouth daily.   Yes [provider]  CINNAMON PO Take 1,000 mg by mouth 2 (two) times daily.   Yes [provider]  co-enzyme Q-10 30 MG capsule Take 30  mg by mouth 3 (three) times daily.   Yes [provider]  fenofibrate (TRICOR) 48 MG tablet Take 48 mg by mouth daily. 10/13/17  Yes [provider]  ferrous sulfate 325 (65 FE) MG EC tablet Take 325 mg by mouth daily with breakfast.   Yes [provider]  glimepiride (AMARYL) 4 MG tablet Take 4 mg by mouth daily. 10/13/17  Yes [provider]  Insulin Glargine (BASAGLAR KWIKPEN) 100 UNIT/ML SOPN Inject 16 Units into the skin at bedtime. 10/19/18  Yes [provider]  Javier Docker Oil 1000 MG CAPS Take 1,000 mg by mouth 2 (two) times daily.    Yes [provider]  lisinopril (PRINIVIL,ZESTRIL) 10 MG tablet Take 10 mg by mouth daily. 10/13/17  Yes [provider]  metFORMIN (GLUCOPHAGE) 1000 MG tablet Take 1,000 mg by mouth 2 (two) times daily. 10/13/17  Yes [provider]  Multiple Vitamins-Minerals (MULTI FOR HIM 50+ PO) Take by mouth.   Yes [provider]  Omega-3 Fatty Acids (FISH OIL) 1000 MG CAPS Take by mouth.   Yes [provider]  pioglitazone (ACTOS) 45 MG tablet Take 45 mg by mouth daily. 10/13/17  Yes [provider]  rosuvastatin (CRESTOR) 5 MG tablet Take 5 mg by mouth daily.   Yes [provider]  sildenafil (REVATIO) 20 MG tablet Take 60-100 mg by mouth daily as needed (erectile dysfunction).   Yes [provider]  XARELTO 20 MG TABS tablet Take 20 mg by mouth daily. 08/11/18  Yes [provider]  Rivaroxaban 15 & 20 MG TBPK Take as directed on package: Start with one 42m tablet by mouth twice a day with food. On Day 22, switch to one 232mtablet once a day with food. Patient not taking: Reported on 12/19/2018 10/21/17   CaWaynetta SandyMD   Physical Exam: Blood pressure 115/62, pulse 91, temperature 98.2 F (  36.8 C), temperature source Oral, resp. rate 17, height _0  (1.93 m), SpO2 100 %. 1. General:  in No Acute distress    Chronically ill  -appearing 2. Psychological: Alert and   Oriented 3. Head/ENT:    Dry Mucous Membranes                          Head Non traumatic, neck supple                            Poor Dentition 4. SKIN:   decreased Skin turgor,  Skin clean Dry and intact no rash 5. Heart: Regular rate and rhythm no  Murmur, no Rub or gallop 6. Lungs:   no wheezes or crackles   7. Abdomen: Soft,  non-tender, Non distended  bowel sounds present 8. Lower extremities: no clubbing, cyanosis, trace edema 9. Neurologically Grossly intact, moving all 4 extremities equally   10. MSK: Normal range of motion   LABS:     Recent Labs  Lab 12/19/18 2153  WBC 4.6  NEUTROABS 2.9  HGB 7.3*  HCT 24.0*  MCV 104.3*  PLT 696   Basic Metabolic Panel: Recent Labs  Lab 12/19/18 2153  NA 126*  K 4.3  CL 97*  CO2 22  GLUCOSE 400*  BUN 14  CREATININE 0.92  CALCIUM 8.2*      Recent Labs  Lab 12/19/18 2153  AST 282*  ALT 438*  ALKPHOS 1,006*  BILITOT 21.4*  PROT 5.5*  ALBUMIN 2.5*   No results for input(s): LIPASE, AMYLASE in the last 168 hours. Recent Labs  Lab 12/19/18 2153  AMMONIA 54*         Radiological Exams on Admission: No results found.  Chart has been reviewed    Assessment/Plan  58 y.o. male with medical history significant of  Left DVT in 2017 and 2018, DM 2, HTN, CKD stage 3, Anemia,    Admitted for elevated LFT'S and biliary obstruction due to pancreatic lesion  Present on Admission: . Pancreatic mass -with biliary obstruction elevated total bilirubin and LFTs.  Most likely secondary to pancreatic malignancy.  But will need further diagnostics.  Discussed with GI Dr. Cristina Gong patient will be seen tomorrow in consult.  Decide tomorrow if he would need MRI/MRCP versus proceed directly to ERCP.  Just in case will make n.p.o. post midnight but if no ERCP scheduled for tomorrow can feed.  Marland Kitchen HTN (hypertension) given soft blood pressures we will allow for some permissive hypertension for  tonight . CKD stage 3 due to type 2 diabetes mellitus ([place]) currently stable continue to monitor avoid nephrotoxic medications . Leg DVT (deep venous thromboembolism), acute, left (HCC) patient on chronic anticoagulation will hold Xarelto for now as patient will likely require procedures done.  Will bridge with heparin as patient is high risk for developing recurrent DVTs . Normocytic anemia patient has history of anemia with downtrending hemoglobin.  Now down to 7.3 currently no chest pain or shortness of breath obtain type and screen may need blood transfusion if continues to go down or prior to procedure  . Tobacco abuse -  - Spoke about importance of quitting spent 5 minutes discussing options for treatment, prior attempts at quitting, and dangers of smoking  -At this point patient is   NOT  interested in quitting  - order nicotine patch   - nursing tobacco cessation protocol  . Elevated  LFTs likely postobstructive will likely require stenting to relieve obstruction we will also obtain hepatitis serologies for completion and acetaminophen level  DM 2-  - Order Sensitive SSI   - continue home insulin regimen Glargin decrease to 10 units,  -  check TSH and HgA1C  - Hold by mouth medications   hypoNatremia in the setting of fluid overload and liver disease and electrolytes will need further imaging for staging evaluation including CT chest routine  Other plan as per orders.  DVT prophylaxis:  SCD   Code Status:  FULL CODE   as per patient  I had personally discussed CODE STATUS with patient   Family Communication:   Family   at  Bedside  plan of care was discussed with   Wife,    Disposition Plan:       To home once workup is complete and patient is stable                     Consults called: [facility]   Admission status:   inpatient     Expect 2 midnight stay secondary to severity of patient's current illness including    Severe lab/radiological/exam abnormalities including:  Liver functions elevated   and extensive comorbidities including:  DM2      CKD    liver disease . Suspected malignancy . Chronic anticoagulation  That are currently affecting medical management.   I expect  patient to be hospitalized for 2 midnights requiring inpatient medical care.  Patient is at high risk for adverse outcome (such as loss of life or disability) if not treated.  Indication for inpatient stay as follows:      inability to maintain oral hydration    Need for operative/procedural  intervention   Need for  IV fluids,  IV pain medications IV anticoagulation    Level of care     medical floor              Toy Baker 12/19/2018, 11:22 PM    [facility]     after 2 AM please page floor coverage PA If 7AM-7PM, please contact the day team taking care of the patient using [email or web address]

## 2018-12-20 ENCOUNTER — Inpatient Hospital Stay (HOSPITAL_COMMUNITY): Payer: Managed Care, Other (non HMO)

## 2018-12-20 ENCOUNTER — Encounter (HOSPITAL_COMMUNITY): Payer: Self-pay | Admitting: Radiology

## 2018-12-20 LAB — COMPREHENSIVE METABOLIC PANEL
ALT: 359 U/L — ABNORMAL HIGH (ref 0–44)
AST: 251 U/L — ABNORMAL HIGH (ref 15–41)
Albumin: 2.1 g/dL — ABNORMAL LOW (ref 3.5–5.0)
Alkaline Phosphatase: 929 U/L — ABNORMAL HIGH (ref 38–126)
Anion gap: 8 (ref 5–15)
BILIRUBIN TOTAL: 18.8 mg/dL — AB (ref 0.3–1.2)
BUN: 12 mg/dL (ref 6–20)
CO2: 20 mmol/L — ABNORMAL LOW (ref 22–32)
Calcium: 8.1 mg/dL — ABNORMAL LOW (ref 8.9–10.3)
Chloride: 100 mmol/L (ref 98–111)
Creatinine, Ser: 0.54 mg/dL — ABNORMAL LOW (ref 0.61–1.24)
GFR calc Af Amer: >60 mL/min (ref >60)
GFR calc non Af Amer: >60 mL/min (ref >60)
Glucose, Bld: 142 mg/dL — ABNORMAL HIGH (ref 70–99)
Potassium: 3.4 mmol/L — ABNORMAL LOW (ref 3.5–5.1)
Sodium: 128 mmol/L — ABNORMAL LOW (ref 135–145)
TOTAL PROTEIN: 5 g/dL — AB (ref 6.5–8.1)

## 2018-12-20 LAB — IRON AND TIBC
Iron: 29 ug/dL — ABNORMAL LOW (ref 45–182)
Saturation Ratios: 8 % — ABNORMAL LOW (ref 17.9–39.5)
TIBC: 366 ug/dL (ref 250–450)
UIBC: 337 ug/dL

## 2018-12-20 LAB — LIPID PANEL
Cholesterol: 960 mg/dL — ABNORMAL HIGH (ref 0–200)
HDL: 11 mg/dL — ABNORMAL LOW (ref >40)
LDL CALC: 907 mg/dL — AB (ref 0–99)
Total CHOL/HDL Ratio: 87.3 RATIO
Triglycerides: 209 mg/dL — ABNORMAL HIGH (ref <150)
VLDL: 42 mg/dL — ABNORMAL HIGH (ref 0–40)

## 2018-12-20 LAB — GLUCOSE, CAPILLARY
GLUCOSE-CAPILLARY: 122 mg/dL — AB (ref 70–99)
Glucose-Capillary: 182 mg/dL — ABNORMAL HIGH (ref 70–99)
Glucose-Capillary: 141 mg/dL — ABNORMAL HIGH (ref 70–99)
Glucose-Capillary: 178 mg/dL — ABNORMAL HIGH (ref 70–99)
Glucose-Capillary: 340 mg/dL — ABNORMAL HIGH (ref 70–99)

## 2018-12-20 LAB — RETICULOCYTES
Immature Retic Fract: 24.2 % — ABNORMAL HIGH (ref 2.3–15.9)
RBC.: 2.07 MIL/uL — ABNORMAL LOW (ref 4.22–5.81)
Retic Count, Absolute: 100.4 10*3/uL (ref 19.0–186.0)
Retic Ct Pct: 4.9 % — ABNORMAL HIGH (ref 0.4–3.1)

## 2018-12-20 LAB — OSMOLALITY: Osmolality: 300 mOsm/kg — ABNORMAL HIGH (ref 275–295)

## 2018-12-20 LAB — PROTIME-INR
INR: 1.22
Prothrombin Time: 15.3 seconds — ABNORMAL HIGH (ref 11.4–15.2)

## 2018-12-20 LAB — HIV ANTIBODY (ROUTINE TESTING W REFLEX): HIV Screen 4th Generation wRfx: NONREACTIVE

## 2018-12-20 LAB — CBC
HCT: 21.1 % — ABNORMAL LOW (ref 39.0–52.0)
Hemoglobin: 6.5 g/dL — CL (ref 13.0–17.0)
MCH: 31.4 pg (ref 26.0–34.0)
MCHC: 30.8 g/dL (ref 30.0–36.0)
MCV: 101.9 fL — ABNORMAL HIGH (ref 80.0–100.0)
Platelets: 303 10*3/uL (ref 150–400)
RBC: 2.07 MIL/uL — ABNORMAL LOW (ref 4.22–5.81)
RDW: 17.1 % — ABNORMAL HIGH (ref 11.5–15.5)
WBC: 4.8 10*3/uL (ref 4.0–10.5)
nRBC: 0 % (ref 0.0–0.2)

## 2018-12-20 LAB — ABO/RH: ABO/RH(D): O POS

## 2018-12-20 LAB — FERRITIN: Ferritin: 115 ng/mL (ref 24–336)

## 2018-12-20 LAB — VITAMIN B12: Vitamin B-12: 1091 pg/mL — ABNORMAL HIGH (ref 180–914)

## 2018-12-20 LAB — MAGNESIUM: Magnesium: 1.9 mg/dL (ref 1.7–2.4)

## 2018-12-20 LAB — OSMOLALITY, URINE: Osmolality, Ur: 668 mOsm/kg (ref 300–900)

## 2018-12-20 LAB — HEPARIN LEVEL (UNFRACTIONATED)
Heparin Unfractionated: 0.53 IU/mL (ref 0.30–0.70)
Heparin Unfractionated: 0.41 IU/mL (ref 0.30–0.70)

## 2018-12-20 LAB — FOLATE: Folate: 16.5 ng/mL (ref >5.9)

## 2018-12-20 LAB — APTT
APTT: 92 s — AB (ref 24–36)
aPTT: 33 seconds (ref 24–36)

## 2018-12-20 LAB — PREPARE RBC (CROSSMATCH)

## 2018-12-20 LAB — PHOSPHORUS: Phosphorus: 3.7 mg/dL (ref 2.5–4.6)

## 2018-12-20 MED ORDER — SODIUM CHLORIDE 0.9% IV SOLUTION
Freq: Once | INTRAVENOUS | Status: AC
  Administered 2018-12-20: 09:00:00 via INTRAVENOUS

## 2018-12-20 MED ORDER — SODIUM CHLORIDE (PF) 0.9 % IJ SOLN
INTRAMUSCULAR | Status: AC
  Filled 2018-12-20: qty 50

## 2018-12-20 MED ORDER — IOHEXOL 300 MG/ML  SOLN
75.0000 mL | Freq: Once | INTRAMUSCULAR | Status: AC | PRN
  Administered 2018-12-20: 75 mL via INTRAVENOUS

## 2018-12-20 MED ORDER — FUROSEMIDE 10 MG/ML IJ SOLN
20.0000 mg | Freq: Once | INTRAMUSCULAR | Status: AC
  Administered 2018-12-20: 20 mg via INTRAVENOUS
  Filled 2018-12-20: qty 2

## 2018-12-20 MED ORDER — IOHEXOL 300 MG/ML  SOLN
100.0000 mL | Freq: Once | INTRAMUSCULAR | Status: AC | PRN
  Administered 2018-12-20: 100 mL via INTRAVENOUS

## 2018-12-20 MED ORDER — HEPARIN (PORCINE) 25000 UT/250ML-% IV SOLN
1600.0000 [IU]/h | INTRAVENOUS | Status: DC
  Filled 2018-12-20: qty 250

## 2018-12-20 MED ORDER — IOHEXOL 300 MG/ML  SOLN
15.0000 mL | Freq: Once | INTRAMUSCULAR | Status: DC | PRN
  Administered 2018-12-20: 30 mL via ORAL
  Filled 2018-12-20: qty 20

## 2018-12-20 MED ORDER — HEPARIN (PORCINE) 25000 UT/250ML-% IV SOLN
1600.0000 [IU]/h | INTRAVENOUS | Status: AC
  Administered 2018-12-20 – 2018-12-21 (×2): 1600 [IU]/h via INTRAVENOUS
  Filled 2018-12-20 (×2): qty 250

## 2018-12-20 NOTE — Progress Notes (Signed)
CRITICAL VALUE ALERT  Critical Value:  Hgb 6.5  Date & Time Notied:  12/20/2018  Provider Notified: ON Call Baltazar Najjar   Orders Received/Actions taken:

## 2018-12-20 NOTE — Progress Notes (Signed)
PROGRESS NOTE  ZYAD BOOMER ZSW:[id number] DOB: September 15, 1961 DOA: 12/19/2018 PCP: Cari Caraway, MD   LOS: 1 day   Brief narrative: CATO LIBURD is a 58 y.o. male with medical history significant of  Left DVT in 2017 and 2018, DM 2, HTN, CKD stage 3, Anemia,  presented with jaundice and new findings of biliary obstruction likely by pancreatic lesion.  Patient reported that he has lost significant weight over the last 1 month.  He was on his way to cruise in [place] when he noted right leg swelling.  He went to the ED at [facility] because he had a history of DVT in the past and wanted to be checked.  He was found to have a hyponatremia with elevated LFTs and significantly elevated bilirubin levels.  CT scan of the abdomen showed moderate to marked intrahepatic and extrahepatic biliary ductal dilatation with the CBD measuring up to 2.6 cm in diameter with pancreatic head multiple cystic masses.  Finding was worrisome for peritoneal cancer with biliary dilatation.  Of note, patient was treated with Xarelto in the past for DVT.  He did have a recurrent DVT and underwent Balloon angioplasty of leftfemoral, common femoral, external and common iliac veins and ivc with 12 and 62mm balloons and he was started on Xarelto life long. 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  Assessment/Plan:  Active Problems:   Leg DVT (deep venous thromboembolism), acute, left (HCC)   DM2 (diabetes mellitus, type 2) (HCC)   HTN (hypertension)   CKD stage 3 due to type 2 diabetes mellitus (HCC)   Normocytic anemia   Pancreatic mass   Tobacco abuse   Elevated LFTs   Hyponatremia  Pancreatic mass with intrahepatic and extrahepatic biliary dilatation with significant jaundice suggestive of obstructive jaundice. - likely secondary to pancreatic malignancy. GI Dr. Cristina Gong was notified about this.  GI to decide if patient would need MRI/MRCP versus proceed directly to ERCP.   currently n.p.o.  Significant anemia likely  secondary to chronic GI blood loss.  Patient denies gross hematemesis melena or blood loss.  Could be secondary to slow GI blood loss.  Has not had a colonoscopy in the past.  Could be related to pancreatic lesion as well.  GI has been consulted.  Will transfuse 2 units of packed RBC today.  Risk versus benefits of blood transfusion explained to the patient.  Iron profile noted.  Essential hypertension.  Will need to reintroduce oral medications when blood pressure is more stable.  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  Will monitor BMP closely.    Left leg deep vein thrombosis with prior history of recurrent DVT.  High risk of pulmonary embolism in the context of a possible malignancy.  Patient would need lifelong anticoagulation.  Currently on heparin drip.  No gross bleeding has been noted so heparin has been continued.  Could consider IVC filter if anticoagulation cannot be continued  Elevated lipid profile, unable to use any statins in the background of elevated LFTs.  Tobacco abuse -continue on nicotine patch.  Reinforced the importance of quitting  Mild hyponatremia.  Will closely monitor.  Check CT scan of the chest for staging.  Urine osmolality 668, serum osmolality 300.  Possibility of SIADH.  Mild hypokalemia.  Will replace.  DM 2-  continue on sliding scale insulin Accu-Cheks.  Continue Lantus at the decreased dose   VTE Prophylaxis: On heparin drip  Code Status: Full code  Family Communication: No one is available  at bedside  Disposition Plan: Home likely in 3 to 4 days   Consultants:  GI  Procedures:  CT chest  Antibiotics: Anti-infectives (From admission, onward)   None     Subjective: Patient denies any nausea, vomiting or abdominal pain.  Complains of clay colored stools and dark urine.  He has impaired appetite.  Objective: Vitals:   12/20/18 0922 12/20/18 0951  BP: 128/76 139/77  Pulse: 76 84  Resp:  18  Temp:  99 F (37.2 C) 98.4 F (36.9 C)  SpO2: 100% 100%    Intake/Output Summary (Last 24 hours) at 12/20/2018 1022 Last data filed at 12/20/2018 1000 Gross per 24 hour  Intake -  Output 300 ml  Net -300 ml   There were no vitals filed for this visit. Body mass index is 26.17 kg/m.   Physical Exam: GENERAL: Patient is alert awake and oriented. Not in obvious distress. HENT: Gross icterus noted.  Pallor noted.  Pupils equally reactive to light. Oral mucosa is moist. NECK: is supple, no palpable thyroid enlargement. CHEST: Clear to auscultation. No crackles or wheezes. Non tender on palpation. Diminished breath sounds bilaterally. CVS: S1 and S2 heard, no murmur. Regular rate and rhythm. No pericardial rub. ABDOMEN: Soft, non-tender, bowel sounds are present. EXTREMITIES: No edema. CNS: Cranial nerves are intact. No focal motor or sensory deficits. SKIN: warm and dry without rashes.  Icterus noted.  Data Review: I have personally reviewed the following laboratory data and studies,  CBC: Recent Labs  Lab 12/19/18 2153 12/20/18 0626  WBC 4.6 4.8  NEUTROABS 2.9  --   HGB 7.3* 6.5*  HCT 24.0* 21.1*  MCV 104.3* 101.9*  PLT 313 539   Basic Metabolic Panel: Recent Labs  Lab 12/19/18 2153 12/20/18 0626  NA 126* 128*  K 4.3 3.4*  CL 97* 100  CO2 22 20*  GLUCOSE 400* 142*  BUN 14 12  CREATININE 0.92 0.54*  CALCIUM 8.2* 8.1*  MG 1.8 1.9  PHOS 3.5 3.7   Liver Function Tests: Recent Labs  Lab 12/19/18 2153 12/20/18 0626  AST 282* 251*  ALT 438* 359*  ALKPHOS 1,006* 929*  BILITOT 21.4* 18.8*  PROT 5.5* 5.0*  ALBUMIN 2.5* 2.1*   Recent Labs  Lab 12/19/18 2153  LIPASE 118*   Recent Labs  Lab 12/19/18 2153  AMMONIA 54*   Cardiac Enzymes: No results for input(s): CKTOTAL, CKMB, CKMBINDEX, TROPONINI in the last 168 hours. BNP (last 3 results) No results for input(s): BNP in the last 8760 hours.  ProBNP (last 3 results) No results for input(s): PROBNP in the  last 8760 hours.  CBG: Recent Labs  Lab 12/19/18 2333 12/20/18 0424 12/20/18 0928  GLUCAP 350* 182* 122*   No results found for this or any previous visit (from the past 240 hour(s)).   Studies: Ct Chest W Contrast  Result Date: 12/20/2018 CLINICAL DATA:  Staging possible pancreatic cancer EXAM: CT CHEST WITH CONTRAST TECHNIQUE: Multidetector CT imaging of the chest was performed during intravenous contrast administration. CONTRAST:  42mL OMNIPAQUE IOHEXOL 300 MG/ML  SOLN COMPARISON:  None. FINDINGS: Cardiovascular: Coronary artery calcifications. Normal heart size. No pericardial effusion. Mediastinum/Nodes: No enlarged mediastinal, hilar, or axillary lymph nodes. Thyroid gland, trachea, and esophagus demonstrate no significant findings. Lungs/Pleura: Bibasilar partial atelectasis. There are occasional small pulmonary nodules, for example a 3 mm nodule of the anterior right middle lobe (series 7, image 88). No pleural effusion or pneumothorax. Upper Abdomen: Gross biliary ductal dilatation and multiple cystic appearing  lesions of the partially included pancreatic tail. Musculoskeletal: No chest wall mass or suspicious bone lesions identified. IMPRESSION: 1. There are occasional small pulmonary nodules, for example a 3 mm nodule of the anterior right middle lobe (series 7, image 88). These are nonspecific and although they likely reflect sequelae of prior infection or inflammation, early metastatic disease is not excluded. Attention on follow-up. 2.  Coronary artery disease. 3. Gross biliary ductal dilatation and multiple cystic appearing lesions of the partially included pancreatic tail. Correlate with complete imaging of the abdomen. Electronically Signed   By: Eddie Candle M.D.   On: 12/20/2018 08:39    Scheduled Meds: . fenofibrate  54 mg Oral Daily  . insulin aspart  0-9 Units Subcutaneous Q4H  . insulin glargine  10 Units Subcutaneous QHS  . nicotine  21 mg Transdermal Daily  . sodium  chloride (PF)      . sodium chloride flush  3 mL Intravenous Q12H    Continuous Infusions: . sodium chloride    . heparin

## 2018-12-20 NOTE — Progress Notes (Signed)
[place] for IV heparin ( on PTA xarelto) Indication: DVT  No Known Allergies  Patient Measurements: Height: 6\' 4"  (193 cm) IBW/kg (Calculated) : 86.8 Heparin Dosing Weight: actual (97 kg)  Vital Signs: Temp: 98.6 F (37 C) (02/18 1956) Temp Source: Oral (02/18 1956) BP: 128/75 (02/18 1956) Pulse Rate: 79 (02/18 1956)  Labs: Recent Labs    12/19/18 2153 12/20/18 0626 12/20/18 1935  HGB 7.3* 6.5*  --   HCT 24.0* 21.1*  --   PLT 313 303  --   APTT  --  33 92*  LABPROT 17.6* 15.3*  --   INR 1.47 1.22  --   HEPARINUNFRC  --  0.53 0.41  CREATININE 0.92 0.54*  --     CrCl cannot be calculated (Unknown ideal weight.).   Medical History: Past Medical History:  Diagnosis Date  . Depression   . Diabetes mellitus without complication ([place])   . Dyslipidemia   . Erectile dysfunction   . Nicotine addiction      Assessment: 56 yoM with hx of left DVT in 2017 and 2018 on xarelto. LD xarelto 2/17 10 am. Baseline labs: H/H=7.3/24 Plts=313 Aptt=33 HL=0.53 INR=1.47  12/20/2018 Heparin drip started at 1300 at rate of 1600 units/hr.  Heparin level 6.5 hrs later is therapeutic at 0.41 and aPTT is therapeutic at 92 seconds.  No bleeding reported.   Goal of Therapy:  aptt 66-102 Heparin level 0.3-0.7 units/ml Monitor platelets by anticoagulation protocol: Yes   Plan:  Continue heparin drip at 1600 units/hr  Heparin drip off at 06 am per GI's orders F/u after GI procedure anticoag plans  Eudelia Bunch, Pharm.D [phone] 12/20/2018 8:32 PM

## 2018-12-20 NOTE — Consult Note (Signed)
Subjective:   HPI  The patient is a 58 year old male who was admitted to the hospital last evening because of jaundice. He states that he and his wife started noticing yellow discoloration to his eyes and skin a few weeks ago. He tells me that he is lost about 4 pounds in the past month. He denies abdominal pain. His stools have been light Clay color recently and his urine has been dark. He denies abdominal pain.  Total bilirubin 18.8, alkaline phosphatase 929, ALT 359, AST 251.  Patient also found to be significantly anemic but denies any signs of gastrointestinal bleeding either from upper or lower GI tract.  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 Last dose was yesterday morning. He is currently on a heparin drip and no Xarelto has been given while here in the hospital.  He had a CT scan of the chest this morning, not of the abdomen. The CT of the chest showed gross biliary ductal dilatation and multiple cystic appearing lesions of the partially included pancreatic tail. It was recommended to correlate with complete imaging of abdomen. I have therefore ordered a CT abdomen and pelvis.  Review of Systems No chest pain or shortness of breath  Past Medical History:  Diagnosis Date  . Depression   . Diabetes mellitus without complication ([place])   . Dyslipidemia   . Erectile dysfunction   . Nicotine addiction    Past Surgical History:  Procedure Laterality Date  . LOWER EXTREMITY ANGIOGRAPHY N/A 10/18/2017   Procedure: LOWER EXTREMITY ANGIOGRAPHY - IVUS - LYSIS CATH;  Surgeon: Waynetta Sandy, MD;  Location: [facility];  Service: Cardiovascular;  Laterality: N/A;  . LOWER EXTREMITY ANGIOGRAPHY N/A 10/19/2017   Procedure: LOWER EXTREMITY ANGIOGRAPHY - LYSIS RECHECK;  Surgeon: Waynetta Sandy, MD;  Location: [facility];  Service: Cardiovascular;  Laterality: N/A;  . PERIPHERAL VASCULAR BALLOON ANGIOPLASTY Left 10/19/2017   Procedure:  PERIPHERAL VASCULAR BALLOON ANGIOPLASTY;  Surgeon: Waynetta Sandy, MD;  Location: [facility];  Service: Cardiovascular;  Laterality: Left;  Multiple Lower extremity venous sites  . PERIPHERAL VASCULAR INTERVENTION Left 10/18/2017   Procedure: PERIPHERAL VASCULAR INTERVENTION;  Surgeon: Waynetta Sandy, MD;  Location: [facility];  Service: Cardiovascular;  Laterality: Left;   Social History   Socioeconomic History  . Marital status: Married    Spouse name: Not on file  . Number of children: Not on file  . Years of education: Not on file  . Highest education level: Not on file  Occupational History  . Not on file  Social Needs  . Financial resource strain: Not on file  . Food insecurity:    Worry: Not on file    Inability: Not on file  . Transportation needs:    Medical: Not on file    Non-medical: Not on file  Tobacco Use  . Smoking status: Current Every Day Smoker    Packs/day: 0.50    Types: Cigarettes  . Smokeless tobacco: Never Used  Substance and Sexual Activity  . Alcohol use: No    Frequency: Never  . Drug use: No  . Sexual activity: Not on file  Lifestyle  . Physical activity:    Days per week: Not on file    Minutes per session: Not on file  . Stress: Not on file  Relationships  . Social connections:    Talks on phone: Not on file  Gets together: Not on file    Attends religious service: Not on file    Active member of club or organization: Not on file    Attends meetings of clubs or organizations: Not on file    Relationship status: Not on file  . Intimate partner violence:    Fear of current or ex partner: Not on file    Emotionally abused: Not on file    Physically abused: Not on file    Forced sexual activity: Not on file  Other Topics Concern  . Not on file  Social History Narrative  . Not on file   family history includes CAD in his father; Diabetes in his father and mother.  Current Facility-Administered  Medications:  .  0.9 %  sodium chloride infusion, 250 mL, Intravenous, PRN, Doutova, Anastassia, MD .  acetaminophen (TYLENOL) tablet 650 mg, 650 mg, Oral, Q6H PRN **OR** acetaminophen (TYLENOL) suppository 650 mg, 650 mg, Rectal, Q6H PRN, Doutova, Anastassia, MD .  fenofibrate tablet 54 mg, 54 mg, Oral, Daily, Doutova, Anastassia, MD, 54 mg at 12/20/18 1009 .  heparin ADULT infusion 100 units/mL (25000 units/258mL sodium chloride 0.45%), 1,600 Units/hr, Intravenous, Continuous, Pia Jedlicka F, MD .  insulin aspart (novoLOG) injection 0-9 Units, 0-9 Units, Subcutaneous, Q4H, Doutova, Anastassia, MD, 2 Units at 12/20/18 0430 .  insulin glargine (LANTUS) injection 10 Units, 10 Units, Subcutaneous, QHS, Doutova, Anastassia, MD, 10 Units at 12/19/18 2320 .  morphine 2 MG/ML injection 1 mg, 1 mg, Intravenous, Q4H PRN, Doutova, Anastassia, MD .  nicotine (NICODERM CQ - dosed in mg/24 hours) patch 21 mg, 21 mg, Transdermal, Daily, Doutova, Anastassia, MD, 21 mg at 12/20/18 1009 .  ondansetron (ZOFRAN) tablet 4 mg, 4 mg, Oral, Q6H PRN **OR** ondansetron (ZOFRAN) injection 4 mg, 4 mg, Intravenous, Q6H PRN, Doutova, Anastassia, MD .  sodium chloride (PF) 0.9 % injection, , , ,  .  sodium chloride flush (NS) 0.9 % injection 3 mL, 3 mL, Intravenous, Q12H, Doutova, Anastassia, MD, 3 mL at 12/20/18 0841 .  sodium chloride flush (NS) 0.9 % injection 3 mL, 3 mL, Intravenous, PRN, Doutova, Anastassia, MD No Known Allergies   Objective:     BP 139/77   Pulse 84   Temp 98.4 F (36.9 C)   Resp 18   Ht 6\' 4"  (1.93 m)   SpO2 100%   BMI 26.17 kg/m   Jaundiced  No acute distress  Heart regular rhythm no murmurs  Lungs clear  Abdomen: Bowel sounds present, soft, nontender, liver is felt to her 3 finger breaths below the right costal margin.  Laboratory No components found for: D1    Assessment:     Obstructive jaundice. CT chest findings as noted.      Plan:  ERCP with stent placement is  planned for tomorrow. A follow-up EUS with pancreatic biopsy more than likely will be needed in the near future. I explained ERCP with sphincterotomy and stent placement to him and his wife. I explained the risks of bleeding infection and perforation and pancreatitis to them. They understand. They are agreeable to proceed. I have asked pharmacy to hold his heparin at 6 in the morning for the ERCP that plan tomorrow at 11:30.

## 2018-12-21 ENCOUNTER — Encounter (HOSPITAL_COMMUNITY)
Admission: AD | Disposition: A | Discharge status: Home / Self Care | Payer: Self-pay | Source: Home / Self Care | Attending: Family Medicine

## 2018-12-21 ENCOUNTER — Inpatient Hospital Stay (HOSPITAL_COMMUNITY): Payer: Managed Care, Other (non HMO)

## 2018-12-21 ENCOUNTER — Inpatient Hospital Stay (HOSPITAL_COMMUNITY): Payer: Managed Care, Other (non HMO) | Admitting: Anesthesiology

## 2018-12-21 ENCOUNTER — Encounter (HOSPITAL_COMMUNITY): Payer: Self-pay | Admitting: *Deleted

## 2018-12-21 HISTORY — PX: BILIARY STENT PLACEMENT: SHX5538

## 2018-12-21 HISTORY — PX: PANCREATIC STENT PLACEMENT: SHX5539

## 2018-12-21 HISTORY — PX: ENDOSCOPIC RETROGRADE CHOLANGIOPANCREATOGRAPHY (ERCP) WITH PROPOFOL: SHX5810

## 2018-12-21 HISTORY — PX: SPHINCTEROTOMY: SHX5544

## 2018-12-21 HISTORY — PX: REMOVAL OF STONES: SHX5545

## 2018-12-21 HISTORY — PX: BIOPSY: SHX5522

## 2018-12-21 LAB — HEPATITIS PANEL, ACUTE
HEP A IGM: NEGATIVE
Hep B C IgM: NEGATIVE
Hepatitis B Surface Ag: NEGATIVE

## 2018-12-21 LAB — GLUCOSE, CAPILLARY
Glucose-Capillary: 117 mg/dL — ABNORMAL HIGH (ref 70–99)
Glucose-Capillary: 132 mg/dL — ABNORMAL HIGH (ref 70–99)
Glucose-Capillary: 151 mg/dL — ABNORMAL HIGH (ref 70–99)
Glucose-Capillary: 244 mg/dL — ABNORMAL HIGH (ref 70–99)
Glucose-Capillary: 343 mg/dL — ABNORMAL HIGH (ref 70–99)
Glucose-Capillary: 447 mg/dL — ABNORMAL HIGH (ref 70–99)
Glucose-Capillary: 528 mg/dL (ref 70–99)

## 2018-12-21 LAB — TYPE AND SCREEN
ABO/RH(D): O POS
Antibody Screen: NEGATIVE
Unit division: 0
Unit division: 0

## 2018-12-21 LAB — BPAM RBC
Blood Product Expiration Date: 202003132359
Blood Product Expiration Date: 202003132359
ISSUE DATE / TIME: 202002180922
ISSUE DATE / TIME: 202002181313
Unit Type and Rh: 5100
Unit Type and Rh: 5100

## 2018-12-21 LAB — HEMOGLOBIN A1C
Hgb A1c MFr Bld: 7.5 % — ABNORMAL HIGH (ref 4.8–5.6)
Mean Plasma Glucose: 169 mg/dL

## 2018-12-21 LAB — CBC
HEMATOCRIT: 25 % — AB (ref 39.0–52.0)
Hemoglobin: 8 g/dL — ABNORMAL LOW (ref 13.0–17.0)
MCH: 31.6 pg (ref 26.0–34.0)
MCHC: 32 g/dL (ref 30.0–36.0)
MCV: 98.8 fL (ref 80.0–100.0)
Platelets: 245 10*3/uL (ref 150–400)
RBC: 2.53 MIL/uL — ABNORMAL LOW (ref 4.22–5.81)
RDW: 17.5 % — ABNORMAL HIGH (ref 11.5–15.5)
WBC: 4.5 10*3/uL (ref 4.0–10.5)
nRBC: 0 % (ref 0.0–0.2)

## 2018-12-21 LAB — OCCULT BLOOD X 1 CARD TO LAB, STOOL: Fecal Occult Bld: POSITIVE — AB

## 2018-12-21 SURGERY — ENDOSCOPIC RETROGRADE CHOLANGIOPANCREATOGRAPHY (ERCP) WITH PROPOFOL
Anesthesia: General

## 2018-12-21 MED ORDER — LACTATED RINGERS IV SOLN
INTRAVENOUS | Status: DC
  Administered 2018-12-21: 13:00:00 via INTRAVENOUS
  Administered 2018-12-21: 1000 mL via INTRAVENOUS

## 2018-12-21 MED ORDER — CIPROFLOXACIN IN D5W 400 MG/200ML IV SOLN
INTRAVENOUS | Status: DC | PRN
  Administered 2018-12-21: 400 mg via INTRAVENOUS

## 2018-12-21 MED ORDER — FENTANYL CITRATE (PF) 100 MCG/2ML IJ SOLN
INTRAMUSCULAR | Status: DC | PRN
  Administered 2018-12-21 (×2): 50 ug via INTRAVENOUS

## 2018-12-21 MED ORDER — INSULIN ASPART 100 UNIT/ML ~~LOC~~ SOLN
12.0000 [IU] | Freq: Once | SUBCUTANEOUS | Status: AC
  Administered 2018-12-21: 12 [IU] via SUBCUTANEOUS

## 2018-12-21 MED ORDER — FENTANYL CITRATE (PF) 100 MCG/2ML IJ SOLN: 25.0000 ug | INTRAMUSCULAR | Status: DC | PRN

## 2018-12-21 MED ORDER — SODIUM CHLORIDE 0.9 % IV SOLN
INTRAVENOUS | Status: DC | PRN
  Administered 2018-12-21: 50 mL

## 2018-12-21 MED ORDER — PROPOFOL 10 MG/ML IV BOLUS
INTRAVENOUS | Status: DC | PRN
  Administered 2018-12-21: 140 mg via INTRAVENOUS

## 2018-12-21 MED ORDER — SODIUM CHLORIDE 0.9 % IV SOLN: INTRAVENOUS | Status: DC

## 2018-12-21 MED ORDER — INDOMETHACIN 50 MG RE SUPP
RECTAL | Status: DC | PRN
  Administered 2018-12-21: 100 mg via RECTAL

## 2018-12-21 MED ORDER — DEXAMETHASONE SODIUM PHOSPHATE 10 MG/ML IJ SOLN
INTRAMUSCULAR | Status: DC | PRN
  Administered 2018-12-21: 6 mg via INTRAVENOUS

## 2018-12-21 MED ORDER — GLUCAGON HCL RDNA (DIAGNOSTIC) 1 MG IJ SOLR
INTRAMUSCULAR | Status: AC
  Filled 2018-12-21: qty 1

## 2018-12-21 MED ORDER — PROPOFOL 10 MG/ML IV BOLUS
INTRAVENOUS | Status: AC
  Filled 2018-12-21: qty 20

## 2018-12-21 MED ORDER — FENTANYL CITRATE (PF) 100 MCG/2ML IJ SOLN
INTRAMUSCULAR | Status: AC
  Filled 2018-12-21: qty 2

## 2018-12-21 MED ORDER — INSULIN ASPART 100 UNIT/ML ~~LOC~~ SOLN
20.0000 [IU] | Freq: Once | SUBCUTANEOUS | Status: AC
  Administered 2018-12-22: 20 [IU] via SUBCUTANEOUS

## 2018-12-21 MED ORDER — PROMETHAZINE HCL 25 MG/ML IJ SOLN: 6.2500 mg | INTRAMUSCULAR | Status: DC | PRN

## 2018-12-21 MED ORDER — MIDAZOLAM HCL 2 MG/2ML IJ SOLN
INTRAMUSCULAR | Status: AC
  Filled 2018-12-21: qty 2

## 2018-12-21 MED ORDER — SUGAMMADEX SODIUM 200 MG/2ML IV SOLN
INTRAVENOUS | Status: DC | PRN
  Administered 2018-12-21: 200 mg via INTRAVENOUS

## 2018-12-21 MED ORDER — INDOMETHACIN 50 MG RE SUPP
RECTAL | Status: AC
  Filled 2018-12-21: qty 1

## 2018-12-21 MED ORDER — PHENYLEPHRINE 40 MCG/ML (10ML) SYRINGE FOR IV PUSH (FOR BLOOD PRESSURE SUPPORT)
PREFILLED_SYRINGE | INTRAVENOUS | Status: DC | PRN
  Administered 2018-12-21 (×2): 80 ug via INTRAVENOUS

## 2018-12-21 MED ORDER — MIDAZOLAM HCL 5 MG/5ML IJ SOLN
INTRAMUSCULAR | Status: DC | PRN
  Administered 2018-12-21: 2 mg via INTRAVENOUS

## 2018-12-21 MED ORDER — HEPARIN (PORCINE) 25000 UT/250ML-% IV SOLN
1600.0000 [IU]/h | INTRAVENOUS | Status: DC
  Administered 2018-12-21: 1600 [IU]/h via INTRAVENOUS
  Filled 2018-12-21: qty 250

## 2018-12-21 MED ORDER — CIPROFLOXACIN IN D5W 400 MG/200ML IV SOLN
INTRAVENOUS | Status: AC
  Filled 2018-12-21: qty 200

## 2018-12-21 MED ORDER — LIDOCAINE 2% (20 MG/ML) 5 ML SYRINGE
INTRAMUSCULAR | Status: DC | PRN
  Administered 2018-12-21: 80 mg via INTRAVENOUS

## 2018-12-21 MED ORDER — ROCURONIUM BROMIDE 50 MG/5ML IV SOSY
PREFILLED_SYRINGE | INTRAVENOUS | Status: DC | PRN
  Administered 2018-12-21 (×2): 10 mg via INTRAVENOUS
  Administered 2018-12-21: 60 mg via INTRAVENOUS

## 2018-12-21 NOTE — Op Note (Signed)
[facility] Patient Name: Brian Torres Procedure Date: 12/21/2018 MRN: [id number] Attending MD: Clarene Essex , MD Date of Birth: [birth year] CSN: [id number] Age: 58 Admit Type: Inpatient Procedure:                ERCP Indications:              Biliary dilation on Computed Tomogram Scan, Tumor                            of the head of pancreas, duct of jaundice Providers:                Clarene Essex, MD, Jeanella Cara, RN, Tinnie Gens, Technician, Arnoldo Hooker, CRNA, Virgia Land,                            CRNA Referring MD:              Medicines:                General Anesthesia Complications:            No immediate complications. Estimated Blood Loss:     Estimated blood loss: none. Procedure:                Pre-Anesthesia Assessment:                           - Prior to the procedure, a History and Physical                            was performed, and patient medications and                            allergies were reviewed. The patient's tolerance of                            previous anesthesia was also reviewed. The risks                            and benefits of the procedure and the sedation                            options and risks were discussed with the patient.                            All questions were answered, and informed consent                            was obtained. Prior Anticoagulants: The patient has                            taken heparin, last dose was day of procedure. ASA  Grade Assessment: II - A patient with mild systemic                            disease. After reviewing the risks and benefits,                            the patient was deemed in satisfactory condition to                            undergo the procedure.                           After obtaining informed consent, the scope was                            passed under direct vision. Throughout the                      procedure, the patient's blood pressure, pulse, and                            oxygen saturations were monitored continuously. The                            [id number]-Q180V ([id number]) Olympus duodenoscope was                            introduced through the mouth, and used to inject                            contrast into and used to cannulate the bile duct.                            The ERCP was accomplished without difficulty. The                            patient tolerated the procedure well. Scope In: Scope Out: Findings:      The major papilla was on the rim of a diverticulum. The minor papilla       was bulging. At the end of the procedure the duodenum adjacent to minor       papilla was biopsied with a cold forceps for histology part of the       bulging area. On initial cannulation the wire went towards the pancreas       however it then seemed to go upward so to confirm we were in the       pancreas we did inject a little dye and once confirming we were in the       pancreas we placed one 4 Fr by 5 cm plastic stent with a 3/4 external       pigtail and no internal flaps was placed 4 cm into the ventral       pancreatic duct. The stent was in good position. We were then able to       readily cannulate the CBD next to the stent and proceeded with a biliary  sphincterotomy which was made with a Hydratome sphincterotome using ERBE       electrocautery. There was no post-sphincterotomy bleeding. We proceeded       until we had some biliary drainage and could get the fully bowed       sphincterotome easily in and out of the duct and to discover the length       of the stricture we tried an occlusion balloon and the biliary tree was       swept with an 18 mm balloon starting at the upper third of the main bile       duct. Nothing was found. However we were unable to see any CBD filling       however the intrahepatics were dilated so we switched to the 15 mm        injecting below balloon and were able to delineate the distal stricture       which was roughly 3 cm and the balloon was removed and we obtained cells       for cytology by brushing in the lower third of the main bile duct. And       then we placed one 10 Fr by 6 cm covered metal stent with no external       flaps and no internal flaps was placed 5.5 cm into the common bile duct.       Bile flowed through the stent. The stent was in good position. The       biopsies of the bulbous minor ampullary region were then obtained and       the scope was removed and the patient tolerated the procedure well Impression:               - The major papilla was on the rim of a                            diverticulum.                           - The minor papilla appeared to be bulging.                           - Biopsy was performed Duodenum adjacent to the                            minor ampulla.                           - One plastic stent was placed into the ventral                            pancreatic duct.                           - A biliary sphincterotomy was performed.                           - The biliary tree was swept and nothing was found.                            The balloon  was used to delineate the stricture as                            above                           - Cells for cytology obtained in the lower third of                            the main duct.                           - One covered metal stent was placed into the                            common bile duct. Moderate Sedation:      Not Applicable - Patient had care per Anesthesia. Recommendation:           - Clear liquid diet for 6 hours. If doing well may                            have soft solids later today or tomorrow                           - Continue present medications.                           - Resume heparin at prior dose in 6 hours without                            bolus and if doing well  tomorrow without signs of                            bleeding can resume blood thinner and hopefully go                            home soon- Return to GI clinic PRN.                           - Telephone GI clinic if symptomatic PRN.                           - Await cytology results and await path results.                           - Check liver enzymes (AST, ALT, alkaline                            phosphatase, bilirubin) and hemogram with white                            blood cell count and platelets in the morning. He  will need an x-ray in 1 week to confirm passage of                            pancreatic duct stent Procedure Code(s):        --- Professional ---                           [id number], Endoscopic retrograde                            cholangiopancreatography (ERCP); with placement of                            endoscopic stent into biliary or pancreatic duct,                            including pre- and post-dilation and guide wire                            passage, when performed, including sphincterotomy,                            when performed, each stent                           43274, 59, Endoscopic retrograde                            cholangiopancreatography (ERCP); with placement of                            endoscopic stent into biliary or pancreatic duct,                            including pre- and post-dilation and guide wire                            passage, when performed, including sphincterotomy,                            when performed, each stent                           43261, 23, Endoscopic retrograde                            cholangiopancreatography (ERCP); with biopsy,                            single or multiple Diagnosis Code(s):        --- Professional ---                           K83.8, Other specified diseases of biliary tract  D49.0, Neoplasm of unspecified behavior of                             digestive system CPT copyright 2018 [employer]. All rights reserved. The codes documented in this report are preliminary and upon coder review may  be revised to meet current compliance requirements. Clarene Essex, MD 12/21/2018 1:47:48 PM This report has been signed electronically. Number of Addenda: 0

## 2018-12-21 NOTE — Progress Notes (Signed)
Brian Torres 12:05 PM  Subjective: Patient seen and examined and discussed with my partner Dr. Penelope Coop and the patient's wife and he has no new symptoms and we extensively discussed the procedure  Objective: Vital signs stable afebrile exam please see preassessment evaluation labs and CT reviewed  Assessment: Obstructive jaundice  Plan: Okay to proceed with ERCP possible brushing and stenting with anesthesia assistance and the risks and success rate was discussed with the patient and his wife  [facility]  Pager [phone] After 5PM or if no answer call [phone]

## 2018-12-21 NOTE — Anesthesia Procedure Notes (Signed)
Procedure Name: Intubation Date/Time: 12/21/2018 12:20 PM Performed by: Maxwell Caul, CRNA Pre-anesthesia Checklist: Patient identified, Emergency Drugs available, Suction available and Patient being monitored Patient Re-evaluated:Patient Re-evaluated prior to induction Oxygen Delivery Method: Circle system utilized Preoxygenation: Pre-oxygenation with 100% oxygen Induction Type: IV induction Ventilation: Mask ventilation without difficulty Grade View: Grade II Tube type: Oral Tube size: 7.5 mm Number of attempts: 1 Airway Equipment and Method: Stylet and Oral airway Placement Confirmation: ETT inserted through vocal cords under direct vision,  positive ETCO2 and breath sounds checked- equal and bilateral Secured at: 23 cm Tube secured with: Tape Dental Injury: Teeth and Oropharynx as per pre-operative assessment

## 2018-12-21 NOTE — Transfer of Care (Signed)
Immediate Anesthesia Transfer of Care Note  Patient: Brian Torres  Procedure(s) Performed: ENDOSCOPIC RETROGRADE CHOLANGIOPANCREATOGRAPHY (ERCP) WITH PROPOFOL (N/A )  Patient Location: PACU  Anesthesia Type:General  Level of Consciousness: awake, alert  and oriented  Airway & Oxygen Therapy: Patient Spontanous Breathing and Patient connected to face mask oxygen  Post-op Assessment: Report given to RN and Post -op Vital signs reviewed and stable  Post vital signs: Reviewed and stable  Last Vitals:  Vitals Value Taken Time  BP 143/63 12/21/2018  1:37 PM  Temp    Pulse 76 12/21/2018  1:38 PM  Resp 22 12/21/2018  1:38 PM  SpO2 100 % 12/21/2018  1:38 PM  Vitals shown include unvalidated device data.  Last Pain:  Vitals:   12/21/18 1112  TempSrc: Oral  PainSc: 0-No pain         Complications: No apparent anesthesia complications

## 2018-12-21 NOTE — Anesthesia Preprocedure Evaluation (Signed)
Anesthesia Evaluation  Patient identified by MRN, date of birth, ID band Patient awake    Reviewed: Allergy & Precautions, NPO status , Patient's Chart, lab work & pertinent test results  Airway Mallampati: II  TM Distance: >3 FB Neck ROM: Full    Dental no notable dental hx.    Pulmonary Current Smoker,    Pulmonary exam normal breath sounds clear to auscultation       Cardiovascular hypertension, Normal cardiovascular exam Rhythm:Regular Rate:Normal     Neuro/Psych negative neurological ROS  negative psych ROS   GI/Hepatic negative GI ROS, Neg liver ROS,   Endo/Other  diabetes, Insulin Dependent  Renal/GU Renal InsufficiencyRenal disease  negative genitourinary   Musculoskeletal negative musculoskeletal ROS (+)   Abdominal   Peds negative pediatric ROS (+)  Hematology  (+) anemia ,   Anesthesia Other Findings   Reproductive/Obstetrics negative OB ROS                             Anesthesia Physical Anesthesia Plan  ASA: III  Anesthesia Plan: General   Post-op Pain Management:    Induction: Intravenous  PONV Risk Score and Plan: 1 and Ondansetron, Treatment may vary due to age or medical condition and Dexamethasone  Airway Management Planned: Oral ETT  Additional Equipment:   Intra-op Plan:   Post-operative Plan: Extubation in OR  Informed Consent: I have reviewed the patients History and Physical, chart, labs and discussed the procedure including the risks, benefits and alternatives for the proposed anesthesia with the patient or authorized representative who has indicated his/her understanding and acceptance.     Dental advisory given  Plan Discussed with: CRNA and Surgeon  Anesthesia Plan Comments:         Anesthesia Quick Evaluation

## 2018-12-21 NOTE — Anesthesia Postprocedure Evaluation (Signed)
Anesthesia Post Note  Patient: Brian Torres  Procedure(s) Performed: ENDOSCOPIC RETROGRADE CHOLANGIOPANCREATOGRAPHY (ERCP) WITH PROPOFOL (N/A ) PANCREATIC STENT PLACEMENT BILIARY STENT PLACEMENT (N/A ) SPHINCTEROTOMY REMOVAL OF STONES BILIARY BRUSHING BIOPSY     Patient location during evaluation: PACU Anesthesia Type: General Level of consciousness: awake and alert Pain management: pain level controlled Vital Signs Assessment: post-procedure vital signs reviewed and stable Respiratory status: spontaneous breathing, nonlabored ventilation, respiratory function stable and patient connected to nasal cannula oxygen Cardiovascular status: blood pressure returned to baseline and stable Postop Assessment: no apparent nausea or vomiting Anesthetic complications: no    Last Vitals:  Vitals:   12/21/18 1340 12/21/18 1350  BP: (!) 143/63 138/69  Pulse: 74 72  Resp: (!) 25 18  Temp:    SpO2: 100% 98%    Last Pain:  Vitals:   12/21/18 1350  TempSrc:   PainSc: 0-No pain                 Marcquis Ridlon S

## 2018-12-21 NOTE — Progress Notes (Addendum)
PROGRESS NOTE  KENDRICKS REAP  FYB:[id number] DOB: [birth year] DOA: 12/19/2018 PCP: Cari Caraway, MD  Brief Narrative: Nazeer Romney Allenis a 58 y.o.malewith medical history significant of Left DVT in 2017 and 2018, DM 2, HTN, CKD stage 3, Anemia,presented withjaundice and new findings of biliary obstruction likely by pancreatic lesion.  Patient reported that he has lost significant weight over the last 1 month.  He was on his way to cruise in [place] when he noted right leg swelling.  He went to the ED at [facility] because he had a history of DVT in the past and wanted to be checked.  He was found to have a hyponatremia with elevated LFTs and significantly elevated bilirubin levels.  CT scan of the abdomen showed moderate to marked intrahepatic and extrahepatic biliary ductal dilatation with the CBD measuring up to 2.6 cm in diameter with pancreatic head multiple cystic masses.  Finding was worrisome for peritoneal cancer with biliary dilatation.  Of note, patient was treated with Xarelto in the past for DVT.  He did have a recurrent DVT and underwentBalloon angioplasty of leftfemoral, common femoral, external and common iliac veins and ivc with 12 and 41mm balloonsand he was started on Xarelto life long. 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  Assessment & Plan: Active Problems:   Leg DVT (deep venous thromboembolism), acute, left (HCC)   DM2 (diabetes mellitus, type 2) (HCC)   HTN (hypertension)   CKD stage 3 due to type 2 diabetes mellitus (HCC)   Normocytic anemia   Pancreatic mass   Tobacco abuse   Elevated LFTs   Hyponatremia  Obstructive jaundice due to pancreatic mass compression.  - s/p ERCP 2/19 with pancreatic stent and CBD stent placement. Trend CMP.  - DC tylenol and fenofibrate for the time being.  - Depending on hepatic function, may need to consider changing to eliquis due to decreased hepatic metabolism compared to xarelto.  Pancreatic head mass 3.5cm x 2.5cm:  Worrisome for malignancy - Pt will need EUS with biopsy. This was said to be scheduled as outpatient next week. I would argue that he would need heparin bridging given his proven hypercoagulability at baseline (previous DVTs) and current possible malignancy. Therefore, he may benefit from more expedited Bx. Will discuss with GI.   Acute blood loss anemia and hematochezia and suspected chronic blood loss anemia and anemia of malignancy: GI bleeding reported for first time 2/19. s/p 2u PRBCs with hgb 6.5 > 8g/dl. Iron panel mixed.  - Check FOBT, discussed with RN.  - Trend CBC in AM, or earlier if bleeding returns.  - Never had colonoscopy, will defer to GI regarding timing of colonoscopy at this time.   Essential hypertension.  Will need to reintroduce oral medications when blood pressure is more stable.  AKI: Do not believe he has CKD with improvement in creatinine to a creatinine clearance >47ml/min.   Left leg deep vein thrombosis with prior history of recurrent DVT.   - Needs indefinite anticoagulation. If needs interruption, would consider IVC filter.  - Continue heparin gtt for now, restart timing per GI post ERCP.   Hyperlipidemia: Marked abnormalities, LDL 907. ?if spurious result.  - No treatment at this time, will plan on repeating lipid panel at some time.   Tobacco use:  - Cessation counseling provided - Nicotine patch  Hyponatremia:  -continue on nicotine patch.  Reinforced the importance of quitting  Mild hyponatremia:  - Monitor in AM. Urine osmolality 668, serum osmolality 300.  Possibility of  SIADH.  Mild hypokalemia:  - Supplement and monitor.  T2DM: Continue lantus and SSI.  DVT prophylaxis: Heparin gtt Code Status: Full Family Communication: Wife at bedside Disposition Plan: Home once stable and treatment completed.  Consultants:   [facility].  Procedures:   ERCP 12/21/2018 by Dr. Watt Climes: Impression:               - The major papilla was on the rim  of a                            diverticulum.                           - The minor papilla appeared to be bulging.                           - Biopsy was performed Duodenum adjacent to the                            minor ampulla.                           - One plastic stent was placed into the ventral                            pancreatic duct.                           - A biliary sphincterotomy was performed.                           - The biliary tree was swept and nothing was found.                            The balloon was used to delineate the stricture as                            above                           - Cells for cytology obtained in the lower third of                            the main duct.                           - One covered metal stent was placed into the                            common bile duct. Recommendation:           - Clear liquid diet for 6 hours. If doing well may                            have soft solids later today or tomorrow                           -  Continue present medications.                           - Resume heparin at prior dose in 6 hours without                            bolus and if doing well tomorrow without signs of                            bleeding can resume blood thinner and hopefully go                            home soon- Return to GI clinic PRN.                           - Telephone GI clinic if symptomatic PRN.                           - Await cytology results and await path results.                           - Check liver enzymes (AST, ALT, alkaline                            phosphatase, bilirubin) and hemogram with white                            blood cell count and platelets in the morning. He                            will need an x-ray in 1 week to confirm passage of                            pancreatic duct stent  Antimicrobials:  None   Subjective: No abdominal pain, nausea, vomiting, diarrhea.  Has dark urine and lighter colored stools. Also had an episode of red blood in stool this morning.   Objective: Vitals:   12/21/18 1340 12/21/18 1350 12/21/18 1400 12/21/18 1410  BP: (!) 143/63 138/69 140/74 (!) 147/79  Pulse: 74 72 65 62  Resp: (!) 25 18 13 14   Temp:   98.1 F (36.7 C)   TempSrc:   Oral   SpO2: 100% 98% 100% 100%  Weight:      Height:        Intake/Output Summary (Last 24 hours) at 12/21/2018 1456 Last data filed at 12/21/2018 1338 Gross per 24 hour  Intake 2319.38 ml  Output -  Net 2319.38 ml   Filed Weights   12/21/18 0714 12/21/18 1112  Weight: 97.5 kg 97.5 kg    Gen: WDWN male in no distress HEENT: Icteric sclerae Pulm: Non-labored breathing room air. Clear to auscultation bilaterally.  CV: Regular rate and rhythm. No murmur, rub, or gallop. No JVD, no pedal edema. GI: Abdomen soft, non-tender, non-distended, with normoactive bowel sounds. No organomegaly or masses felt. Ext: Warm, no deformities Skin: Severe jaundice, no wounds Neuro: Alert and oriented. No focal neurological deficits. Psych: Judgement and  insight appear normal. Mood & affect appropriate.   Data Reviewed: I have personally reviewed following labs and imaging studies  CBC: Recent Labs  Lab 12/19/18 2153 12/20/18 0626 12/21/18 0615  WBC 4.6 4.8 4.5  NEUTROABS 2.9  --   --   HGB 7.3* 6.5* 8.0*  HCT 24.0* 21.1* 25.0*  MCV 104.3* 101.9* 98.8  PLT 313 303 454   Basic Metabolic Panel: Recent Labs  Lab 12/19/18 2153 12/20/18 0626  NA 126* 128*  K 4.3 3.4*  CL 97* 100  CO2 22 20*  GLUCOSE 400* 142*  BUN 14 12  CREATININE 0.92 0.54*  CALCIUM 8.2* 8.1*  MG 1.8 1.9  PHOS 3.5 3.7   GFR: Estimated Creatinine Clearance: 123.6 mL/min (A) (by C-G formula based on SCr of 0.54 mg/dL (L)). Liver Function Tests: Recent Labs  Lab 12/19/18 2153 12/20/18 0626  AST 282* 251*  ALT 438* 359*  ALKPHOS 1,006* 929*  BILITOT 21.4* 18.8*  PROT 5.5* 5.0*  ALBUMIN 2.5* 2.1*    Recent Labs  Lab 12/19/18 2153  LIPASE 118*   Recent Labs  Lab 12/19/18 2153  AMMONIA 54*   Coagulation Profile: Recent Labs  Lab 12/19/18 2153 12/20/18 0626  INR 1.47 1.22   Cardiac Enzymes: No results for input(s): CKTOTAL, CKMB, CKMBINDEX, TROPONINI in the last 168 hours. BNP (last 3 results) No results for input(s): PROBNP in the last 8760 hours. HbA1C: Recent Labs    12/19/18 2153  HGBA1C 7.5*   CBG: Recent Labs  Lab 12/20/18 2019 12/21/18 0007 12/21/18 0439 12/21/18 0734 12/21/18 1206  GLUCAP 340* 447* 117* 151* 132*   Lipid Profile: Recent Labs    12/20/18 0626  CHOL 960*  HDL 11*  LDLCALC 907*  TRIG 209*  CHOLHDL 87.3   Thyroid Function Tests: Recent Labs    12/19/18 2153  TSH 2.327   Anemia Panel: Recent Labs    12/20/18 0626  VITAMINB12 1,091*  FOLATE 16.5  FERRITIN 115  TIBC 366  IRON 29*  RETICCTPCT 4.9*   Urine analysis: No results found for: COLORURINE, APPEARANCEUR, LABSPEC, PHURINE, GLUCOSEU, HGBUR, BILIRUBINUR, KETONESUR, PROTEINUR, UROBILINOGEN, NITRITE, LEUKOCYTESUR No results found for this or any previous visit (from the past 240 hour(s)).    Radiology Studies: Ct Chest W Contrast  Result Date: 12/20/2018 CLINICAL DATA:  Staging possible pancreatic cancer EXAM: CT CHEST WITH CONTRAST TECHNIQUE: Multidetector CT imaging of the chest was performed during intravenous contrast administration. CONTRAST:  17mL OMNIPAQUE IOHEXOL 300 MG/ML  SOLN COMPARISON:  None. FINDINGS: Cardiovascular: Coronary artery calcifications. Normal heart size. No pericardial effusion. Mediastinum/Nodes: No enlarged mediastinal, hilar, or axillary lymph nodes. Thyroid gland, trachea, and esophagus demonstrate no significant findings. Lungs/Pleura: Bibasilar partial atelectasis. There are occasional small pulmonary nodules, for example a 3 mm nodule of the anterior right middle lobe (series 7, image 88). No pleural effusion or pneumothorax. Upper  Abdomen: Gross biliary ductal dilatation and multiple cystic appearing lesions of the partially included pancreatic tail. Musculoskeletal: No chest wall mass or suspicious bone lesions identified. IMPRESSION: 1. There are occasional small pulmonary nodules, for example a 3 mm nodule of the anterior right middle lobe (series 7, image 88). These are nonspecific and although they likely reflect sequelae of prior infection or inflammation, early metastatic disease is not excluded. Attention on follow-up. 2.  Coronary artery disease. 3. Gross biliary ductal dilatation and multiple cystic appearing lesions of the partially included pancreatic tail. Correlate with complete imaging of the abdomen. Electronically Signed   By:  Eddie Candle M.D.   On: 12/20/2018 08:39   Ct Abdomen Pelvis W Contrast  Result Date: 12/20/2018 CLINICAL DATA:  Jaundice. EXAM: CT ABDOMEN AND PELVIS WITH CONTRAST TECHNIQUE: Multidetector CT imaging of the abdomen and pelvis was performed using the standard protocol following bolus administration of intravenous contrast. CONTRAST:  159mL OMNIPAQUE IOHEXOL 300 MG/ML SOLN intravenously, 10mL OMNIPAQUE IOHEXOL 300 MG/ML SOLN orally COMPARISON:  CT scan of same day. FINDINGS: Lower chest: No acute abnormality. Hepatobiliary: No gallstones are noted. Severe intrahepatic and extrahepatic biliary dilatation is noted. No focal hepatic lesion is noted. Pancreas: Possible 3.5 x 2.5 cm solid mass seen in pancreatic head which appears to be causing above mentioned biliary dilatation. Ductal dilatation in the pancreas is also noted in the body and tail with multiple cystic lesions seen in the body and tail. The largest measuring 3.1 cm. Spleen: Normal in size without focal abnormality. Adrenals/Urinary Tract: Adrenal glands appear normal. Right renal cyst is noted. No hydronephrosis or renal obstruction is noted. Urinary bladder is unremarkable. Stomach/Bowel: Stomach is within normal limits. Appendix appears  normal. No evidence of bowel wall thickening, distention, or inflammatory changes. Vascular/Lymphatic: Aortic atherosclerosis. No enlarged abdominal or pelvic lymph nodes. Reproductive: Prostate is unremarkable. Other: No abdominal wall hernia or abnormality. No abdominopelvic ascites. Musculoskeletal: No acute or significant osseous findings. IMPRESSION: Severe intrahepatic and extrahepatic biliary dilatation is noted due to possible 3.5 x 2.5 cm solid mass in pancreatic head concerning for malignancy. Pancreatic ductal dilatation is noted as well with multiple cystic lesions noted in the pancreatic body and tail. MRI of the pancreas is recommended when the patient can follow breathing instructions and hold still. Aortic Atherosclerosis (ICD10-I70.0). Electronically Signed   By: Marijo Conception, M.D.   On: 12/20/2018 19:56   Dg Ercp Biliary & Pancreatic Ducts  Result Date: 12/21/2018 CLINICAL DATA:  Jaundice EXAM: ERCP TECHNIQUE: Multiple spot images obtained with the fluoroscopic device and submitted for interpretation post-procedure. COMPARISON:  CT 12/20/2018 FINDINGS: Series of fluoroscopic spot images document endoscopic cannulation and opacification of the CBD and biliary tree. Subsequent images document deployment of a metallic biliary stent across the CBD obstruction. IMPRESSION: Endoscopic CBD cannulation and metallic biliary stent placement. These images were submitted for radiologic interpretation only. Please see the procedural report for the amount of contrast and the fluoroscopy time utilized. Electronically Signed   By: Lucrezia Europe M.D.   On: 12/21/2018 13:42    Scheduled Meds: . insulin aspart  0-9 Units Subcutaneous Q4H  . insulin glargine  10 Units Subcutaneous QHS  . nicotine  21 mg Transdermal Daily  . sodium chloride flush  3 mL Intravenous Q12H   Continuous Infusions: . sodium chloride    . heparin       LOS: 2 days   Time spent: 35 minutes.  Patrecia Pour, MD [facility] [email or web address] Password [facility] 12/21/2018, 2:56 PM

## 2018-12-21 NOTE — Progress Notes (Signed)
Patient is alert and oriented x4. No pain complained. Cleat liquids diet in 6hrs then advanced diet as MD ordered.

## 2018-12-21 NOTE — Progress Notes (Signed)
Brenda for IV heparin ( on PTA xarelto) Indication: DVT  No Known Allergies  Patient Measurements: Height: 6\' 4"  (193 cm) Weight: 214 lb 15.2 oz (97.5 kg) IBW/kg (Calculated) : 86.8 Heparin Dosing Weight: actual (97 kg)  Vital Signs: Temp: 98.1 F (36.7 C) (02/19 1400) Temp Source: Oral (02/19 1400) BP: 147/79 (02/19 1410) Pulse Rate: 62 (02/19 1410)  Labs: Recent Labs    12/19/18 2153 12/20/18 0626 12/20/18 1935 12/21/18 0615  HGB 7.3* 6.5*  --  8.0*  HCT 24.0* 21.1*  --  25.0*  PLT 313 303  --  245  APTT  --  33 92*  --   LABPROT 17.6* 15.3*  --   --   INR 1.47 1.22  --   --   HEPARINUNFRC  --  0.53 0.41  --   CREATININE 0.92 0.54*  --   --    Estimated Creatinine Clearance: 123.6 mL/min (A) (by C-G formula based on SCr of 0.54 mg/dL (L)).  Medical History: Past Medical History:  Diagnosis Date  . Depression   . Diabetes mellitus without complication ([place])   . Dyslipidemia   . Erectile dysfunction   . Nicotine addiction    Assessment: 89 yoM with hx of left DVT in 2017 and 2018 on xarelto. LD xarelto 2/17 10 am.  Baseline labs: H/H=7.3/24 Plts=313 Aptt=33 HL=0.53 INR=1.47  Today, 12/21/2018 Heparin off at 0600 for ERCP with stent placement H/H improved, Plt wnl  Goal of Therapy:  aPTT 66-102 Heparin level 0.3-0.7 units/ml Monitor platelets by anticoagulation protocol: Yes   Plan:   Resume Heparin at 1600 units/hr at 1930 per GI MD  Check Hep level at 0200  Daily CBC, daily Heparin level from 2/21 am  Follow up aPTT with 0200 labs tomorrow, anticipate level will correlate with heparin level  Minda Ditto PharmD Pager [phone] 12/21/2018, 2:51 PM

## 2018-12-22 ENCOUNTER — Encounter (HOSPITAL_COMMUNITY): Payer: Self-pay | Admitting: Gastroenterology

## 2018-12-22 LAB — HEMOGLOBIN AND HEMATOCRIT, BLOOD
HCT: 27.2 % — ABNORMAL LOW (ref 39.0–52.0)
HCT: 30.2 % — ABNORMAL LOW (ref 39.0–52.0)
Hemoglobin: 8.5 g/dL — ABNORMAL LOW (ref 13.0–17.0)
Hemoglobin: 9.2 g/dL — ABNORMAL LOW (ref 13.0–17.0)

## 2018-12-22 LAB — CBC
HCT: 24.6 % — ABNORMAL LOW (ref 39.0–52.0)
Hemoglobin: 7.8 g/dL — ABNORMAL LOW (ref 13.0–17.0)
MCH: 31.7 pg (ref 26.0–34.0)
MCHC: 31.7 g/dL (ref 30.0–36.0)
MCV: 100 fL (ref 80.0–100.0)
Platelets: 305 10*3/uL (ref 150–400)
RBC: 2.46 MIL/uL — ABNORMAL LOW (ref 4.22–5.81)
RDW: 16.4 % — ABNORMAL HIGH (ref 11.5–15.5)
WBC: 5.3 10*3/uL (ref 4.0–10.5)
nRBC: 0 % (ref 0.0–0.2)

## 2018-12-22 LAB — COMPREHENSIVE METABOLIC PANEL
ALT: 346 U/L — ABNORMAL HIGH (ref 0–44)
AST: 295 U/L — ABNORMAL HIGH (ref 15–41)
Albumin: 2 g/dL — ABNORMAL LOW (ref 3.5–5.0)
Alkaline Phosphatase: 839 U/L — ABNORMAL HIGH (ref 38–126)
Anion gap: 6 (ref 5–15)
BUN: 23 mg/dL — ABNORMAL HIGH (ref 6–20)
CO2: 22 mmol/L (ref 22–32)
CREATININE: 1 mg/dL (ref 0.61–1.24)
Calcium: 7.7 mg/dL — ABNORMAL LOW (ref 8.9–10.3)
Chloride: 98 mmol/L (ref 98–111)
GFR calc non Af Amer: >60 mL/min (ref >60)
Glucose, Bld: 493 mg/dL — ABNORMAL HIGH (ref 70–99)
Potassium: 4.5 mmol/L (ref 3.5–5.1)
Sodium: 126 mmol/L — ABNORMAL LOW (ref 135–145)
Total Bilirubin: 13.4 mg/dL — ABNORMAL HIGH (ref 0.3–1.2)
Total Protein: 4.6 g/dL — ABNORMAL LOW (ref 6.5–8.1)

## 2018-12-22 LAB — GLUCOSE, CAPILLARY
GLUCOSE-CAPILLARY: 345 mg/dL — AB (ref 70–99)
GLUCOSE-CAPILLARY: 292 mg/dL — AB (ref 70–99)
Glucose-Capillary: 342 mg/dL — ABNORMAL HIGH (ref 70–99)
Glucose-Capillary: 230 mg/dL — ABNORMAL HIGH (ref 70–99)
Glucose-Capillary: 325 mg/dL — ABNORMAL HIGH (ref 70–99)

## 2018-12-22 LAB — HEPARIN LEVEL (UNFRACTIONATED): Heparin Unfractionated: 0.21 IU/mL — ABNORMAL LOW (ref 0.30–0.70)

## 2018-12-22 LAB — APTT: aPTT: 80 seconds — ABNORMAL HIGH (ref 24–36)

## 2018-12-22 MED ORDER — HEPARIN (PORCINE) 25000 UT/250ML-% IV SOLN
1600.0000 [IU]/h | INTRAVENOUS | Status: DC
  Administered 2018-12-22: 1600 [IU]/h via INTRAVENOUS
  Filled 2018-12-22 (×2): qty 250

## 2018-12-22 MED ORDER — PIOGLITAZONE HCL 45 MG PO TABS
45.0000 mg | ORAL_TABLET | Freq: Every day | ORAL | Status: DC
  Administered 2018-12-22 – 2018-12-23 (×2): 45 mg via ORAL
  Filled 2018-12-22 (×2): qty 1

## 2018-12-22 MED ORDER — LISINOPRIL 10 MG PO TABS
10.0000 mg | ORAL_TABLET | Freq: Every day | ORAL | Status: DC
  Administered 2018-12-22 – 2018-12-23 (×2): 10 mg via ORAL
  Filled 2018-12-22 (×2): qty 1

## 2018-12-22 MED ORDER — ROSUVASTATIN CALCIUM 5 MG PO TABS: 5.0000 mg | ORAL_TABLET | Freq: Every day | ORAL | Status: DC

## 2018-12-22 MED ORDER — INSULIN GLARGINE 100 UNIT/ML ~~LOC~~ SOLN
16.0000 [IU] | Freq: Every day | SUBCUTANEOUS | Status: DC
  Administered 2018-12-22: 16 [IU] via SUBCUTANEOUS
  Filled 2018-12-22: qty 0.16

## 2018-12-22 MED ORDER — TRAMADOL HCL 50 MG PO TABS
50.0000 mg | ORAL_TABLET | Freq: Once | ORAL | Status: DC
  Filled 2018-12-22: qty 1

## 2018-12-22 MED ORDER — INSULIN ASPART 100 UNIT/ML ~~LOC~~ SOLN
0.0000 [IU] | Freq: Three times a day (TID) | SUBCUTANEOUS | Status: DC
  Administered 2018-12-22 (×2): 11 [IU] via SUBCUTANEOUS
  Administered 2018-12-22 – 2018-12-23 (×2): 5 [IU] via SUBCUTANEOUS

## 2018-12-22 MED ORDER — INSULIN ASPART 100 UNIT/ML ~~LOC~~ SOLN
0.0000 [IU] | Freq: Every day | SUBCUTANEOUS | Status: DC
  Administered 2018-12-22: 3 [IU] via SUBCUTANEOUS

## 2018-12-22 NOTE — Progress Notes (Signed)
Positive for occult blood.  Hgb 7.8 Heparin gtt stopped as per NPat 0300 repeat cbc in am

## 2018-12-22 NOTE — Progress Notes (Signed)
PROGRESS NOTE  Brian Torres  ZOX:[id number] DOB: 1961/01/08 DOA: 12/19/2018 PCP: Cari Caraway, MD  Brief Narrative: Brian Kos Allenis a 58 y.o.malewith medical history significant of Left DVT in 2017 and 2018, DM 2, HTN, CKD stage 3, Anemia,presented withjaundice and new findings of biliary obstruction likely by pancreatic lesion.  Patient reported that he has lost significant weight over the last 1 month.  He was on his way to cruise in [place] when he noted right leg swelling.  He went to the ED at [place] because he had a history of DVT in the past and wanted to be checked.  He was found to have a hyponatremia with elevated LFTs and significantly elevated bilirubin levels.  CT scan of the abdomen showed moderate to marked intrahepatic and extrahepatic biliary ductal dilatation with the CBD measuring up to 2.6 cm in diameter with pancreatic head multiple cystic masses.  Finding was worrisome for peritoneal cancer with biliary dilatation.  Of note, patient was treated with Xarelto in the past for DVT.  He did have a recurrent DVT and underwentBalloon angioplasty of leftfemoral, common femoral, external and common iliac veins and ivc with 12 and 67mm balloonsand he was started on Xarelto life long. 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  Assessment & Plan: Active Problems:   Leg DVT (deep venous thromboembolism), acute, left (HCC)   DM2 (diabetes mellitus, type 2) (HCC)   HTN (hypertension)   CKD stage 3 due to type 2 diabetes mellitus (HCC)   Normocytic anemia   Pancreatic mass   Tobacco abuse   Elevated LFTs   Hyponatremia  Obstructive jaundice due to pancreatic mass compression.  - s/p ERCP 2/19 with pancreatic stent and CBD stent placement. Trend CMP (improving).  - Needs XR in 1 week to confirm clearance of pancreatic stent. - DC tylenol and fenofibrate for the time being.   Pancreatic head mass 3.5cm x 2.5cm: Worrisome for malignancy - Pt will need EUS with biopsy. This  was said to be scheduled as outpatient next week. I would argue that he would need heparin bridging given his proven hypercoagulability at baseline (previous DVTs) and current possible malignancy. Therefore, he may benefit from more expedited Bx. Will discuss with GI.   Acute blood loss anemia and hematochezia and suspected chronic blood loss anemia and anemia of malignancy: GI bleeding reported for first time 2/19. s/p 2u PRBCs with hgb 6.5 > 8 > 7.8g/dl. Iron panel mixed.  - With ongoing bleeding this AM, will check CBC q12h. Could expect low volume of bleeding with sphincterotomy 2/19. - Never had colonoscopy, will defer to GI regarding timing of colonoscopy at this time. Would suspect if unable to restart anticoagulation without recurrent bleeding, would need colonoscopy prior to discharge. Discussed personally with Dr. Penelope Coop, who also feels that cleanout this close after sphincterotomy and double stent placement is less safe than delayed procedure.   Essential hypertension.  Will need to reintroduce oral medications when blood pressure is more stable. - Restart lisinopril  AKI: Do not believe he has CKD with improvement in creatinine to a creatinine clearance >73ml/min.   Left leg deep vein thrombosis with prior history of recurrent DVT. Now also suspect malignancy increasing hypercoagulability. - Needs indefinite anticoagulation. If needs interruption, would consider IVC filter. GI wishes to follow up in outpatient setting prior to returning for EUS or colonoscopy. Would recommend minimizing time off anticoagulation.  - Given hepatic impairment and GI bleed, may benefit from switch xarelto > eliquis at discharge. -  Continue heparin gtt for now, restart this PM without bolus, d/w pharmacy.    T2DM with hyperglycemia:  - Liquid diet high in sugar, ate a lot last night and shot sugar up. Will return lantus to 16u qHS and augment SSI today. Can advance to carb-modified diet.  Hyperlipidemia:  Marked abnormalities, LDL 907. ?if spurious result.  - Hold statin with liver issues for now.   Tobacco use:  - Cessation counseling provided - Nicotine patch  Mild hyponatremia: Urine osmolality 668, serum osmolality 300.  Possibility of SIADH. - Recheck in AM.   Mild hypokalemia:  - Supplement and monitor.  DVT prophylaxis: Heparin gtt to restart 5pm if no bleeding. Code Status: Full Family Communication: Wife at bedside Disposition Plan: Home once stable and treatment completed.  Consultants:   [facility].  Procedures:   ERCP 12/21/2018 by Dr. Watt Climes: Impression:               - The major papilla was on the rim of a                            diverticulum.                           - The minor papilla appeared to be bulging.                           - Biopsy was performed Duodenum adjacent to the                            minor ampulla.                           - One plastic stent was placed into the ventral                            pancreatic duct.                           - A biliary sphincterotomy was performed.                           - The biliary tree was swept and nothing was found.                            The balloon was used to delineate the stricture as                            above                           - Cells for cytology obtained in the lower third of                            the main duct.                           - One covered metal stent was placed into the  common bile duct. Recommendation:           - Clear liquid diet for 6 hours. If doing well may                            have soft solids later today or tomorrow                           - Continue present medications.                           - Resume heparin at prior dose in 6 hours without                            bolus and if doing well tomorrow without signs of                            bleeding can resume blood thinner and hopefully go                             home soon- Return to GI clinic PRN.                           - Telephone GI clinic if symptomatic PRN.                           - Await cytology results and await path results.                           - Check liver enzymes (AST, ALT, alkaline                            phosphatase, bilirubin) and hemogram with white                            blood cell count and platelets in the morning. He                            will need an x-ray in 1 week to confirm passage of                            pancreatic duct stent  Antimicrobials:  None   Subjective: Tolerated procedure well without subsequent nausea or vomiting. Had red blood in darker stool around 1am and no BMs since. No other bleeding noted. Very badly wants a cigarette.  Objective: Vitals:   12/21/18 1516 12/21/18 1556 12/21/18 2021 12/22/18 0432  BP: 136/74  131/78 128/77  Pulse: (!) 108 98 69 72  Resp:   18 17  Temp: 97.6 F (36.4 C)  97.8 F (36.6 C) (!) 97.5 F (36.4 C)  TempSrc:      SpO2: 100%  100% 99%  Weight:      Height:        Intake/Output Summary (Last 24 hours) at 12/22/2018 1248 Last data filed at 12/22/2018 0600 Gross per 24 hour  Intake 1515  ml  Output -  Net 1515 ml   Filed Weights   12/21/18 0714 12/21/18 1112  Weight: 97.5 kg 97.5 kg   Gen: 58 y.o. male in no distress HEENT: +conjunctival pallor Pulm: Nonlabored breathing room air. Clear. CV: Regular rate and rhythm. No murmur, rub, or gallop. No JVD, no dependent edema. GI: Abdomen soft, non-tender, non-distended, with normoactive bowel sounds.  Ext: Warm, no deformities Skin: No new rashes, lesions or ulcers on visualized skin. Jaundice appreciably diminished.  Neuro: Alert and oriented. No focal neurological deficits. Psych: Judgement and insight appear fair. Mood irritable & affect congruent. Behavior is appropriate.    Data Reviewed: I have personally reviewed following labs and imaging  studies  CBC: Recent Labs  Lab 12/19/18 2153 12/20/18 0626 12/21/18 0615 12/22/18 0028 12/22/18 0159  WBC 4.6 4.8 4.5  --  5.3  NEUTROABS 2.9  --   --   --   --   HGB 7.3* 6.5* 8.0* 9.2* 7.8*  HCT 24.0* 21.1* 25.0* 30.2* 24.6*  MCV 104.3* 101.9* 98.8  --  100.0  PLT 313 303 245  --  324   Basic Metabolic Panel: Recent Labs  Lab 12/19/18 2153 12/20/18 0626 12/22/18 0159  NA 126* 128* 126*  K 4.3 3.4* 4.5  CL 97* 100 98  CO2 22 20* 22  GLUCOSE 400* 142* 493*  BUN 14 12 23*  CREATININE 0.92 0.54* 1.00  CALCIUM 8.2* 8.1* 7.7*  MG 1.8 1.9  --   PHOS 3.5 3.7  --    GFR: Estimated Creatinine Clearance: 98.9 mL/min (by C-G formula based on SCr of 1 mg/dL). Liver Function Tests: Recent Labs  Lab 12/19/18 2153 12/20/18 0626 12/22/18 0159  AST 282* 251* 295*  ALT 438* 359* 346*  ALKPHOS 1,006* 929* 839*  BILITOT 21.4* 18.8* 13.4*  PROT 5.5* 5.0* 4.6*  ALBUMIN 2.5* 2.1* 2.0*   Recent Labs  Lab 12/19/18 2153  LIPASE 118*   Recent Labs  Lab 12/19/18 2153  AMMONIA 54*   Coagulation Profile: Recent Labs  Lab 12/19/18 2153 12/20/18 0626  INR 1.47 1.22   Cardiac Enzymes: No results for input(s): CKTOTAL, CKMB, CKMBINDEX, TROPONINI in the last 168 hours. BNP (last 3 results) No results for input(s): PROBNP in the last 8760 hours. HbA1C: Recent Labs    12/19/18 2153  HGBA1C 7.5*   CBG: Recent Labs  Lab 12/21/18 2022 12/21/18 2350 12/22/18 0433 12/22/18 0823 12/22/18 1203  GLUCAP 343* 528* 342* 230* 325*   Lipid Profile: Recent Labs    12/20/18 0626  CHOL 960*  HDL 11*  LDLCALC 907*  TRIG 209*  CHOLHDL 87.3   Thyroid Function Tests: Recent Labs    12/19/18 2153  TSH 2.327   Anemia Panel: Recent Labs    12/20/18 0626  VITAMINB12 1,091*  FOLATE 16.5  FERRITIN 115  TIBC 366  IRON 29*  RETICCTPCT 4.9*   Urine analysis: No results found for: COLORURINE, APPEARANCEUR, LABSPEC, PHURINE, GLUCOSEU, HGBUR, BILIRUBINUR, KETONESUR,  PROTEINUR, UROBILINOGEN, NITRITE, LEUKOCYTESUR No results found for this or any previous visit (from the past 240 hour(s)).    Radiology Studies: Ct Abdomen Pelvis W Contrast  Result Date: 12/20/2018 CLINICAL DATA:  Jaundice. EXAM: CT ABDOMEN AND PELVIS WITH CONTRAST TECHNIQUE: Multidetector CT imaging of the abdomen and pelvis was performed using the standard protocol following bolus administration of intravenous contrast. CONTRAST:  189mL OMNIPAQUE IOHEXOL 300 MG/ML SOLN intravenously, 27mL OMNIPAQUE IOHEXOL 300 MG/ML SOLN orally COMPARISON:  CT scan of same  day. FINDINGS: Lower chest: No acute abnormality. Hepatobiliary: No gallstones are noted. Severe intrahepatic and extrahepatic biliary dilatation is noted. No focal hepatic lesion is noted. Pancreas: Possible 3.5 x 2.5 cm solid mass seen in pancreatic head which appears to be causing above mentioned biliary dilatation. Ductal dilatation in the pancreas is also noted in the body and tail with multiple cystic lesions seen in the body and tail. The largest measuring 3.1 cm. Spleen: Normal in size without focal abnormality. Adrenals/Urinary Tract: Adrenal glands appear normal. Right renal cyst is noted. No hydronephrosis or renal obstruction is noted. Urinary bladder is unremarkable. Stomach/Bowel: Stomach is within normal limits. Appendix appears normal. No evidence of bowel wall thickening, distention, or inflammatory changes. Vascular/Lymphatic: Aortic atherosclerosis. No enlarged abdominal or pelvic lymph nodes. Reproductive: Prostate is unremarkable. Other: No abdominal wall hernia or abnormality. No abdominopelvic ascites. Musculoskeletal: No acute or significant osseous findings. IMPRESSION: Severe intrahepatic and extrahepatic biliary dilatation is noted due to possible 3.5 x 2.5 cm solid mass in pancreatic head concerning for malignancy. Pancreatic ductal dilatation is noted as well with multiple cystic lesions noted in the pancreatic body and tail.  MRI of the pancreas is recommended when the patient can follow breathing instructions and hold still. Aortic Atherosclerosis (ICD10-I70.0). Electronically Signed   By: Marijo Conception, M.D.   On: 12/20/2018 19:56   Dg Ercp Biliary & Pancreatic Ducts  Result Date: 12/21/2018 CLINICAL DATA:  Jaundice EXAM: ERCP TECHNIQUE: Multiple spot images obtained with the fluoroscopic device and submitted for interpretation post-procedure. COMPARISON:  CT 12/20/2018 FINDINGS: Series of fluoroscopic spot images document endoscopic cannulation and opacification of the CBD and biliary tree. Subsequent images document deployment of a metallic biliary stent across the CBD obstruction. IMPRESSION: Endoscopic CBD cannulation and metallic biliary stent placement. These images were submitted for radiologic interpretation only. Please see the procedural report for the amount of contrast and the fluoroscopy time utilized. Electronically Signed   By: Lucrezia Europe M.D.   On: 12/21/2018 13:42    Scheduled Meds: . insulin aspart  0-15 Units Subcutaneous TID WC  . insulin aspart  0-5 Units Subcutaneous QHS  . insulin glargine  16 Units Subcutaneous QHS  . nicotine  21 mg Transdermal Daily  . sodium chloride flush  3 mL Intravenous Q12H   Continuous Infusions: . sodium chloride    . heparin       LOS: 3 days   Time spent: 35 minutes.  Patrecia Pour, MD [facility] [email or web address] Password [facility] 12/22/2018, 12:48 PM

## 2018-12-22 NOTE — Progress Notes (Signed)
[facility] Progress Note  Subjective: The patient feels fine today. Denies abdominal pain. He did well after his ERCP. He was found to have a distal bile duct stricture and a stent was placed. He had some rectal bleeding last night. He has never had a colonoscopy.  Objective: Vital signs in last 24 hours: Temp:  [97.5 F (36.4 C)-98.1 F (36.7 C)] 97.5 F (36.4 C) (02/20 0432) Pulse Rate:  [62-108] 72 (02/20 0432) Resp:  [12-25] 17 (02/20 0432) BP: (128-147)/(63-79) 128/77 (02/20 0432) SpO2:  [98 %-100 %] 99 % (02/20 0432) Weight:  [97.5 kg] 97.5 kg (02/19 1112) Weight change:    PE:  Heart regular rhythm  Lungs clear  Abdomen soft and nontender  Lab Results: Results for orders placed or performed during the hospital encounter of 12/19/18 (from the past 24 hour(s))  Glucose, capillary     Status: Abnormal   Collection Time: 12/21/18 12:06 PM  Result Value Ref Range   Glucose-Capillary 132 (H) 70 - 99 mg/dL  Glucose, capillary     Status: Abnormal   Collection Time: 12/21/18  4:32 PM  Result Value Ref Range   Glucose-Capillary 244 (H) 70 - 99 mg/dL  Glucose, capillary     Status: Abnormal   Collection Time: 12/21/18  8:22 PM  Result Value Ref Range   Glucose-Capillary 343 (H) 70 - 99 mg/dL  Occult blood card to lab, stool RN will collect     Status: Abnormal   Collection Time: 12/21/18 11:11 PM  Result Value Ref Range   Fecal Occult Bld POSITIVE (A) NEGATIVE  Glucose, capillary     Status: Abnormal   Collection Time: 12/21/18 11:50 PM  Result Value Ref Range   Glucose-Capillary 528 (HH) 70 - 99 mg/dL   Comment 1 Notify RN   Hemoglobin and hematocrit, blood     Status: Abnormal   Collection Time: 12/22/18 12:28 AM  Result Value Ref Range   Hemoglobin 9.2 (L) 13.0 - 17.0 g/dL   HCT 30.2 (L) 39.0 - 52.0 %  Heparin level (unfractionated)     Status: Abnormal   Collection Time: 12/22/18  1:59 AM  Result Value Ref Range   Heparin Unfractionated 0.21 (L)  0.30 - 0.70 IU/mL  APTT     Status: Abnormal   Collection Time: 12/22/18  1:59 AM  Result Value Ref Range   aPTT 80 (H) 24 - 36 seconds  CBC     Status: Abnormal   Collection Time: 12/22/18  1:59 AM  Result Value Ref Range   WBC 5.3 4.0 - 10.5 K/uL   RBC 2.46 (L) 4.22 - 5.81 MIL/uL   Hemoglobin 7.8 (L) 13.0 - 17.0 g/dL   HCT 24.6 (L) 39.0 - 52.0 %   MCV 100.0 80.0 - 100.0 fL   MCH 31.7 26.0 - 34.0 pg   MCHC 31.7 30.0 - 36.0 g/dL   RDW 16.4 (H) 11.5 - 15.5 %   Platelets 305 150 - 400 K/uL   nRBC 0.0 0.0 - 0.2 %  Comprehensive metabolic panel     Status: Abnormal   Collection Time: 12/22/18  1:59 AM  Result Value Ref Range   Sodium 126 (L) 135 - 145 mmol/L   Potassium 4.5 3.5 - 5.1 mmol/L   Chloride 98 98 - 111 mmol/L   CO2 22 22 - 32 mmol/L   Glucose, Bld 493 (H) 70 - 99 mg/dL   BUN 23 (H) 6 - 20 mg/dL   Creatinine, Ser 1.00 0.61 -  1.24 mg/dL   Calcium 7.7 (L) 8.9 - 10.3 mg/dL   Total Protein 4.6 (L) 6.5 - 8.1 g/dL   Albumin 2.0 (L) 3.5 - 5.0 g/dL   AST 295 (H) 15 - 41 U/L   ALT 346 (H) 0 - 44 U/L   Alkaline Phosphatase 839 (H) 38 - 126 U/L   Total Bilirubin 13.4 (H) 0.3 - 1.2 mg/dL   GFR calc non Af Amer >60 >60 mL/min   GFR calc Af Amer >60 >60 mL/min   Anion gap 6 5 - 15  Glucose, capillary     Status: Abnormal   Collection Time: 12/22/18  4:33 AM  Result Value Ref Range   Glucose-Capillary 342 (H) 70 - 99 mg/dL  Glucose, capillary     Status: Abnormal   Collection Time: 12/22/18  8:23 AM  Result Value Ref Range   Glucose-Capillary 230 (H) 70 - 99 mg/dL    Studies/Results: Dg Ercp Biliary & Pancreatic Ducts  Result Date: 12/21/2018 CLINICAL DATA:  Jaundice EXAM: ERCP TECHNIQUE: Multiple spot images obtained with the fluoroscopic device and submitted for interpretation post-procedure. COMPARISON:  CT 12/20/2018 FINDINGS: Series of fluoroscopic spot images document endoscopic cannulation and opacification of the CBD and biliary tree. Subsequent images document  deployment of a metallic biliary stent across the CBD obstruction. IMPRESSION: Endoscopic CBD cannulation and metallic biliary stent placement. These images were submitted for radiologic interpretation only. Please see the procedural report for the amount of contrast and the fluoroscopy time utilized. Electronically Signed   By: Lucrezia Europe M.D.   On: 12/21/2018 13:42      Assessment: Obstructive jaundice. Distal biliary stricture stent placed.  Rule out cholangiocarcinoma versus pancreatic carcinoma.  Rectal bleeding  Plan:   The patient has never had a colonoscopy and at some point this will need to be considered. The next step as far as his obstructive jaundice is concerned would be to have an EUS with biopsy of pancreatic lesion and get pathological diagnosis. Discussed with Dr. Watt Climes and he will want to see him in the office in follow-up probably next week and arrange for further diagnostic testing. Restart heparin later this afternoon if no further signs of bleeding. If no further signs of bleeding restart his Xeralto discharge.    SAM F Shawndale Kilpatrick 12/22/2018, 11:02 AM  Pager: [phone] If no answer or after 5 PM call [phone]

## 2018-12-22 NOTE — Progress Notes (Signed)
[place] for IV heparin ( on PTA xarelto) Indication: DVT  No Known Allergies  Patient Measurements: Height: 6\' 4"  (193 cm) Weight: 214 lb 15.2 oz (97.5 kg) IBW/kg (Calculated) : 86.8 Heparin Dosing Weight: actual (97 kg)  Vital Signs: Temp: 97.8 F (36.6 C) (02/19 2021) BP: 131/78 (02/19 2021) Pulse Rate: 69 (02/19 2021)  Labs: Recent Labs    12/19/18 2153 12/20/18 0626 12/20/18 1935 12/21/18 0615 12/22/18 0028 12/22/18 0159  HGB 7.3* 6.5*  --  8.0* 9.2* 7.8*  HCT 24.0* 21.1*  --  25.0* 30.2* 24.6*  PLT 313 303  --  245  --  305  APTT  --  33 92*  --   --  80*  LABPROT 17.6* 15.3*  --   --   --   --   INR 1.47 1.22  --   --   --   --   HEPARINUNFRC  --  0.53 0.41  --   --  0.21*  CREATININE 0.92 0.54*  --   --   --  1.00   Estimated Creatinine Clearance: 98.9 mL/min (by C-G formula based on SCr of 1 mg/dL).  Medical History: Past Medical History:  Diagnosis Date  . Depression   . Diabetes mellitus without complication ([place])   . Dyslipidemia   . Erectile dysfunction   . Nicotine addiction    Assessment: 22 yoM with hx of left DVT in 2017 and 2018 on xarelto. LD xarelto 2/17 10 am.  Baseline labs: H/H=7.3/24 Plts=313 Aptt=33 HL=0.53 INR=1.47  2/19 Heparin off at 0600 for ERCP with stent placement H/H improved, Plt wnl Today, 2/20 0028 Hgb = 9.2 > 0159 Hgb = 7.8 red blood in stools 2/19 am. RN called MD and heparin was held d/t above- f/u with dayshift MD for restart. 0159 HL 0.21 and aptt = 80 sec, still not quite correlating but will change to HL since xarelto effect likely gone.  Goal of Therapy:  aPTT 66-102 Heparin level 0.3-0.7 units/ml Monitor platelets by anticoagulation protocol: Yes   Plan:    Heparin is currently on hold d/t drop in Hgb.  F/u restart  Daily CBC, daily Heparin level from 2/21 am    Dorrene German 12/22/2018, 3:07 AM

## 2018-12-22 NOTE — Progress Notes (Signed)
[place] for IV heparin ( on PTA xarelto) Indication: DVT  No Known Allergies  Patient Measurements: Height: 6\' 4"  (193 cm) Weight: 214 lb 15.2 oz (97.5 kg) IBW/kg (Calculated) : 86.8 Heparin Dosing Weight: actual (97 kg)  Vital Signs: Temp: 97.5 F (36.4 C) (02/20 0432) BP: 128/77 (02/20 0432) Pulse Rate: 72 (02/20 0432)  Labs: Recent Labs    12/19/18 2153 12/20/18 0626 12/20/18 1935 12/21/18 0615 12/22/18 0028 12/22/18 0159  HGB 7.3* 6.5*  --  8.0* 9.2* 7.8*  HCT 24.0* 21.1*  --  25.0* 30.2* 24.6*  PLT 313 303  --  245  --  305  APTT  --  33 92*  --   --  80*  LABPROT 17.6* 15.3*  --   --   --   --   INR 1.47 1.22  --   --   --   --   HEPARINUNFRC  --  0.53 0.41  --   --  0.21*  CREATININE 0.92 0.54*  --   --   --  1.00   Estimated Creatinine Clearance: 98.9 mL/min (by C-G formula based on SCr of 1 mg/dL).  Medical History: Past Medical History:  Diagnosis Date  . Depression   . Diabetes mellitus without complication ([place])   . Dyslipidemia   . Erectile dysfunction   . Nicotine addiction    Assessment: 42 yoM with hx of left DVT in 2017 and 2018 on xarelto. LD xarelto 2/17 10 am.  Baseline labs: H/H=7.3/24 Plts=313 Aptt=33 HL=0.53 INR=1.47  Today, 12/22/18  Heparin stopped over night after blood BM. Occult stool sample was positive  Hgb dropped over night to 7.8, Plt wnl   Goal of Therapy:  aPTT 66-102 Heparin level 0.3-0.7 units/ml Monitor platelets by anticoagulation protocol: Yes   Plan:     Per discussion with MD, ok to resume heparin drip today at 1700  HL/aPTT 6 hours after start of infusion  Monitor closely  for signs and symptoms of bleeding     Royetta Asal, PharmD, BCPS Pager [phone] 12/22/2018 11:39 AM

## 2018-12-23 LAB — CBC
HEMATOCRIT: 25.9 % — AB (ref 39.0–52.0)
Hemoglobin: 8.3 g/dL — ABNORMAL LOW (ref 13.0–17.0)
MCH: 32.7 pg (ref 26.0–34.0)
MCHC: 32 g/dL (ref 30.0–36.0)
MCV: 102 fL — ABNORMAL HIGH (ref 80.0–100.0)
NRBC: 0 % (ref 0.0–0.2)
Platelets: 282 10*3/uL (ref 150–400)
RBC: 2.54 MIL/uL — ABNORMAL LOW (ref 4.22–5.81)
RDW: 17.3 % — ABNORMAL HIGH (ref 11.5–15.5)
WBC: 4.8 10*3/uL (ref 4.0–10.5)

## 2018-12-23 LAB — GLUCOSE, CAPILLARY
GLUCOSE-CAPILLARY: 377 mg/dL — AB (ref 70–99)
Glucose-Capillary: 272 mg/dL — ABNORMAL HIGH (ref 70–99)
Glucose-Capillary: 226 mg/dL — ABNORMAL HIGH (ref 70–99)

## 2018-12-23 LAB — COMPREHENSIVE METABOLIC PANEL
ALT: 397 U/L — ABNORMAL HIGH (ref 0–44)
AST: 330 U/L — ABNORMAL HIGH (ref 15–41)
Albumin: 2.2 g/dL — ABNORMAL LOW (ref 3.5–5.0)
Alkaline Phosphatase: 793 U/L — ABNORMAL HIGH (ref 38–126)
Anion gap: 7 (ref 5–15)
BUN: 14 mg/dL (ref 6–20)
CO2: 21 mmol/L — ABNORMAL LOW (ref 22–32)
Calcium: 8 mg/dL — ABNORMAL LOW (ref 8.9–10.3)
Chloride: 99 mmol/L (ref 98–111)
Creatinine, Ser: 0.8 mg/dL (ref 0.61–1.24)
Glucose, Bld: 232 mg/dL — ABNORMAL HIGH (ref 70–99)
Potassium: 4.2 mmol/L (ref 3.5–5.1)
Sodium: 127 mmol/L — ABNORMAL LOW (ref 135–145)
Total Bilirubin: 8.3 mg/dL — ABNORMAL HIGH (ref 0.3–1.2)
Total Protein: 4.8 g/dL — ABNORMAL LOW (ref 6.5–8.1)

## 2018-12-23 LAB — APTT: aPTT: 65 seconds — ABNORMAL HIGH (ref 24–36)

## 2018-12-23 LAB — HEPARIN LEVEL (UNFRACTIONATED)
HEPARIN UNFRACTIONATED: 0.35 [IU]/mL (ref 0.30–0.70)
Heparin Unfractionated: 0.18 IU/mL — ABNORMAL LOW (ref 0.30–0.70)

## 2018-12-23 MED ORDER — APIXABAN 5 MG PO TABS
5.0000 mg | ORAL_TABLET | Freq: Two times a day (BID) | ORAL | Status: DC
  Administered 2018-12-23: 5 mg via ORAL
  Filled 2018-12-23: qty 1

## 2018-12-23 MED ORDER — HEPARIN (PORCINE) 25000 UT/250ML-% IV SOLN
1700.0000 [IU]/h | INTRAVENOUS | Status: AC
  Administered 2018-12-23: 1700 [IU]/h via INTRAVENOUS
  Filled 2018-12-23: qty 250

## 2018-12-23 MED ORDER — APIXABAN 5 MG PO TABS: 5.0000 mg | ORAL_TABLET | Freq: Two times a day (BID) | ORAL | 0 refills | Status: DC

## 2018-12-23 NOTE — Discharge Summary (Signed)
Physician Discharge Summary  Brian Torres DDU:[id number] DOB: 09-10-1961 DOA: 12/19/2018  PCP: Cari Caraway, MD  Admit date: 12/19/2018 Discharge date: 12/23/2018  Admitted From: Home Disposition: Home   Recommendations for Outpatient Follow-up:  1. Follow up with PCP in next 3 days with repeat CBC, CMP. 2. Needs follow up with GI in the next week to discuss endoscopic ultrasound with biopsy of pancreatic mass. Will also need colonoscopy.  3. Note many medications held due to abnormal liver function and may be restarted once these normalize. Particularly, xarelto changed to eliquis for now. 4. Note a suspected spurious lipid panel including LDL >900. Would recommend rechecking soon.   Home Health: None Equipment/Devices: None Discharge Condition: Stable CODE STATUS: Full Diet recommendation: Carb-modified, low fat.  Brief/Interim Summary: Brian Torres a 58 y.o.malewith medical history significant of Left DVT in 2017 and 2018, DM 2, HTN, CKD stage 3, anemia,presented withjaundice and new findings of biliary obstruction likely by pancreatic lesion.Patient reported that he has lost significant weight over the last 1 month. He was on his way to cruise in [place] when he noted right leg swelling. He went to the ED at [facility] because he had a history of DVT in the past and wanted to be checked. He was found to have a hyponatremia with elevated LFTs and significantly elevated bilirubin levels. CT scan of the abdomen showed moderate to marked intrahepatic and extrahepatic biliary ductal dilatation with the CBD measuring up to 2.6 cm in diameter with pancreatic head multiple cystic masses. Finding was worrisome for peritoneal cancer with biliary dilatation. Of note, patient was treated with Xarelto in the past for DVT. He did have a recurrent DVT and underwentballoon angioplasty of leftfemoral, common femoral, external and common iliac veins and IVC with 12 and 28mm  balloonsand he was started on Xarelto life long. 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  On arrival he was jaundiced with elevated LFTs. Hgb 6.5, improved with 2u PRBCs. GI was consulted and ultimately placed pancreatic duct stent and CBD stent with subsequent improvement in LFTs. There was low volume red bleeding per rectum while admitted though this ceased despite restarting anticoagulation.   Discharge Diagnoses:  Active Problems:   Leg DVT (deep venous thromboembolism), acute, left (HCC)   DM2 (diabetes mellitus, type 2) (HCC)   HTN (hypertension)   CKD stage 3 due to type 2 diabetes mellitus (HCC)   Normocytic anemia   Pancreatic mass   Tobacco abuse   Elevated LFTs   Hyponatremia  Obstructive jaundice due to pancreatic mass compression.  - s/p ERCP 2/19 with pancreatic stent and CBD stent placement. Trend CMP (improving).  - Needs XR in 1 week to confirm clearance of pancreatic stent. - DC tylenol and fenofibrate for the time being.   Pancreatic head mass 3.5cm x 2.5cm: Worrisome for malignancy - Pt will need EUS with biopsy, GI to arrange follow up next week to discuss.  Acute blood loss anemia and hematochezia and suspected chronic blood loss anemia and anemia of malignancy: GI bleeding reported for first time 2/19. s/p 2u PRBCs with hgb 6.5 > 8 g/dl. Iron panel mixed. There was no further bleeding despite restart of anticoagulation and hgb has remained stable at 8.3 on morning of discharge. Could expect low volume of bleeding with sphincterotomy 2/19. - Never had colonoscopy, will defer to GI regarding timing of colonoscopy at this time. Discussed personally with Dr. Penelope Coop, who also feels that cleanout this close after sphincterotomy and double stent placement  is less safe than delayed procedure.   Essential hypertension.   - Restarted lisinopril  AKI: Resolved. Do not believe he has CKD with improvement in creatinine to a creatinine clearance >18ml/min.   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 - Needs indefinite anticoagulation. If needs interruption, would consider IVC filter and/or bridging. GI wishes to follow up in outpatient setting prior to returning for EUS or colonoscopy. Would recommend minimizing time off anticoagulation.  - Given hepatic impairment and GI bleed, switched xarelto > eliquis at discharge.  T2DM with hyperglycemia:  - Restart home medications at discharge.    Hyperlipidemia: Marked abnormalities, LDL 907. ?if spurious result.  - Hold statin with liver issues for now.   Tobacco use:  - Cessation counseling provided - Nicotine patch while admitted but he was very irritable for a while when he had to remain in the hospital.   Mild hyponatremia: Urine osmolality 668,serum osmolality 300.Possibility of SIADH. - Recheck at follow up   Mild hypokalemia:  - Supplemented and resolved  Discharge Instructions Discharge Instructions    Discharge instructions   Complete by:  As directed    - Stop taking xarelto for now and instead take eliquis 5mg  twice daily as directed. This is because it is cleared less by the liver.  - Stop taking aspirin until you follow up with your doctor. Please also hold crestor, tylenol, and fenofibrate for the time being. These can all damage the liver, and may need to be restarted once liver function tests improve.  - Follow up with PCP with labs on Monday and follow up with [facility] next week as scheduled. If this has not been scheduled by Monday, please call or have PCP arrange close follow up. You will still need a endoscopic ultrasound with biopsy of the pancreatic mass and colonoscopy.  - Follow a low fat diet   Increase activity slowly   Complete by:  As directed      Allergies as of 12/23/2018   No Known Allergies     Medication List    STOP taking these medications   acetaminophen 500 MG  tablet Commonly known as:  TYLENOL   aspirin 81 MG tablet   fenofibrate 48 MG tablet Commonly known as:  TRICOR   Krill Oil 1000 MG Caps   Rivaroxaban 15 & 20 MG Tbpk   rosuvastatin 5 MG tablet Commonly known as:  CRESTOR   XARELTO 20 MG Tabs tablet Generic drug:  rivaroxaban     TAKE these medications   apixaban 5 MG Tabs tablet Commonly known as:  ELIQUIS Take 1 tablet (5 mg total) by mouth 2 (two) times daily.   b complex vitamins tablet Take 1 tablet by mouth daily.   BASAGLAR KWIKPEN 100 UNIT/ML Sopn Inject 16 Units into the skin at bedtime.   CINNAMON PO Take 1,000 mg by mouth 2 (two) times daily.   co-enzyme Q-10 30 MG capsule Take 30 mg by mouth 3 (three) times daily.   ferrous sulfate 325 (65 FE) MG EC tablet Take 325 mg by mouth daily with breakfast.   Fish Oil 1000 MG Caps Take by mouth.   glimepiride 4 MG tablet Commonly known as:  AMARYL Take 4 mg by mouth daily.   lisinopril 10 MG tablet Commonly known as:  PRINIVIL,ZESTRIL Take 10 mg by mouth daily.   metFORMIN 1000 MG tablet Commonly known as:  GLUCOPHAGE Take 1,000 mg by  mouth 2 (two) times daily.   MULTI FOR HIM 50+ PO Take by mouth.   pioglitazone 45 MG tablet Commonly known as:  ACTOS Take 45 mg by mouth daily.   sildenafil 20 MG tablet Commonly known as:  REVATIO Take 60-100 mg by mouth daily as needed (erectile dysfunction).       No Known Allergies  Consultations:  GI  Pharmacy  Procedures/Studies: Ct Chest W Contrast  Result Date: 12/20/2018 CLINICAL DATA:  Staging possible pancreatic cancer EXAM: CT CHEST WITH CONTRAST TECHNIQUE: Multidetector CT imaging of the chest was performed during intravenous contrast administration. CONTRAST:  56mL OMNIPAQUE IOHEXOL 300 MG/ML  SOLN COMPARISON:  None. FINDINGS: Cardiovascular: Coronary artery calcifications. Normal heart size. No pericardial effusion. Mediastinum/Nodes: No enlarged mediastinal, hilar, or axillary lymph  nodes. Thyroid gland, trachea, and esophagus demonstrate no significant findings. Lungs/Pleura: Bibasilar partial atelectasis. There are occasional small pulmonary nodules, for example a 3 mm nodule of the anterior right middle lobe (series 7, image 88). No pleural effusion or pneumothorax. Upper Abdomen: Gross biliary ductal dilatation and multiple cystic appearing lesions of the partially included pancreatic tail. Musculoskeletal: No chest wall mass or suspicious bone lesions identified. IMPRESSION: 1. There are occasional small pulmonary nodules, for example a 3 mm nodule of the anterior right middle lobe (series 7, image 88). These are nonspecific and although they likely reflect sequelae of prior infection or inflammation, early metastatic disease is not excluded. Attention on follow-up. 2.  Coronary artery disease. 3. Gross biliary ductal dilatation and multiple cystic appearing lesions of the partially included pancreatic tail. Correlate with complete imaging of the abdomen. Electronically Signed   By: Eddie Candle M.D.   On: 12/20/2018 08:39   Ct Abdomen Pelvis W Contrast  Result Date: 12/20/2018 CLINICAL DATA:  Jaundice. EXAM: CT ABDOMEN AND PELVIS WITH CONTRAST TECHNIQUE: Multidetector CT imaging of the abdomen and pelvis was performed using the standard protocol following bolus administration of intravenous contrast. CONTRAST:  164mL OMNIPAQUE IOHEXOL 300 MG/ML SOLN intravenously, 60mL OMNIPAQUE IOHEXOL 300 MG/ML SOLN orally COMPARISON:  CT scan of same day. FINDINGS: Lower chest: No acute abnormality. Hepatobiliary: No gallstones are noted. Severe intrahepatic and extrahepatic biliary dilatation is noted. No focal hepatic lesion is noted. Pancreas: Possible 3.5 x 2.5 cm solid mass seen in pancreatic head which appears to be causing above mentioned biliary dilatation. Ductal dilatation in the pancreas is also noted in the body and tail with multiple cystic lesions seen in the body and tail. The  largest measuring 3.1 cm. Spleen: Normal in size without focal abnormality. Adrenals/Urinary Tract: Adrenal glands appear normal. Right renal cyst is noted. No hydronephrosis or renal obstruction is noted. Urinary bladder is unremarkable. Stomach/Bowel: Stomach is within normal limits. Appendix appears normal. No evidence of bowel wall thickening, distention, or inflammatory changes. Vascular/Lymphatic: Aortic atherosclerosis. No enlarged abdominal or pelvic lymph nodes. Reproductive: Prostate is unremarkable. Other: No abdominal wall hernia or abnormality. No abdominopelvic ascites. Musculoskeletal: No acute or significant osseous findings. IMPRESSION: Severe intrahepatic and extrahepatic biliary dilatation is noted due to possible 3.5 x 2.5 cm solid mass in pancreatic head concerning for malignancy. Pancreatic ductal dilatation is noted as well with multiple cystic lesions noted in the pancreatic body and tail. MRI of the pancreas is recommended when the patient can follow breathing instructions and hold still. Aortic Atherosclerosis (ICD10-I70.0). Electronically Signed   By: Marijo Conception, M.D.   On: 12/20/2018 19:56   Dg Ercp Biliary & Pancreatic Ducts  Result Date: 12/21/2018  CLINICAL DATA:  Jaundice EXAM: ERCP TECHNIQUE: Multiple spot images obtained with the fluoroscopic device and submitted for interpretation post-procedure. COMPARISON:  CT 12/20/2018 FINDINGS: Series of fluoroscopic spot images document endoscopic cannulation and opacification of the CBD and biliary tree. Subsequent images document deployment of a metallic biliary stent across the CBD obstruction. IMPRESSION: Endoscopic CBD cannulation and metallic biliary stent placement. These images were submitted for radiologic interpretation only. Please see the procedural report for the amount of contrast and the fluoroscopy time utilized. Electronically Signed   By: Lucrezia Europe M.D.   On: 12/21/2018 13:42     ERCP 12/21/2018 by Dr.  Watt Climes: Impression: - The major papilla was on the rim of a  diverticulum. - The minor papilla appeared to be bulging. - Biopsy was performed Duodenum adjacent to the  minor ampulla. - One plastic stent was placed into the ventral  pancreatic duct. - A biliary sphincterotomy was performed. - The biliary tree was swept and nothing was found.  The balloon was used to delineate the stricture as  above - Cells for cytology obtained in the lower third of  the main duct. - One covered metal stent was placed into the  common bile duct. Recommendation: - Clear liquid diet for 6 hours. If doing well may  have soft solids later today or tomorrow - Continue present medications. - Resume heparin at prior dose in 6 hours without  bolus and if doing well tomorrow without signs of  bleeding can resume blood thinner and hopefully go  home soon- Return to GI clinic PRN. - Telephone GI clinic if symptomatic PRN. - Await cytology results and await path results. - Check liver enzymes (AST, ALT, alkaline  phosphatase, bilirubin) and hemogram with white  blood cell count and platelets in the morning. He  will need an x-ray in 1 week to confirm passage of  pancreatic duct stent  Subjective: Feels  well, no bleeding/bruising. Has his clothes on and ready to go home early this morning.  Discharge Exam: Vitals:   12/22/18 2041 12/23/18 0445  BP: 129/73 114/60  Pulse: 81 71  Resp: 20 19  Temp: 98 F (36.7 C) 98 F (36.7 C)  SpO2: 100% 100%   General: Pt is alert, awake, not in acute distress HEENT: Scleral icterus improved, +conjunctival pallor Cardiovascular: RRR, S1/S2 +, no rubs, no gallops Respiratory: CTA bilaterally, no wheezing, no rhonchi Abdominal: Soft, NT, ND, bowel sounds + Extremities: No edema, no cyanosis Skin: Jaundice improved but remains.  Labs: BNP (last 3 results) No results for input(s): BNP in the last 8760 hours. Basic Metabolic Panel: Recent Labs  Lab 12/19/18 2153 12/20/18 0626 12/22/18 0159 12/23/18 0746  NA 126* 128* 126* 127*  K 4.3 3.4* 4.5 4.2  CL 97* 100 98 99  CO2 22 20* 22 21*  GLUCOSE 400* 142* 493* 232*  BUN 14 12 23* 14  CREATININE 0.92 0.54* 1.00 0.80  CALCIUM 8.2* 8.1* 7.7* 8.0*  MG 1.8 1.9  --   --   PHOS 3.5 3.7  --   --    Liver Function Tests: Recent Labs  Lab 12/19/18 2153 12/20/18 0626 12/22/18 0159 12/23/18 0746  AST 282* 251* 295* 330*  ALT 438* 359* 346* 397*  ALKPHOS 1,006* 929* 839* 793*  BILITOT 21.4* 18.8* 13.4* 8.3*  PROT 5.5* 5.0* 4.6* 4.8*  ALBUMIN 2.5* 2.1* 2.0* 2.2*   Recent Labs  Lab 12/19/18 2153  LIPASE 118*   Recent Labs  Lab 12/19/18 2153  AMMONIA 54*   CBC: Recent Labs  Lab 12/19/18 2153 12/20/18 0626 12/21/18 0615 12/22/18 0028 12/22/18 0159 12/22/18 1635 12/23/18 0746  WBC 4.6 4.8 4.5  --  5.3  --  4.8  NEUTROABS 2.9  --   --   --   --   --   --   HGB 7.3* 6.5* 8.0* 9.2* 7.8* 8.5* 8.3*  HCT 24.0* 21.1* 25.0* 30.2* 24.6* 27.2* 25.9*  MCV 104.3* 101.9* 98.8  --  100.0  --  102.0*  PLT 313 303 245  --  305  --  282   Cardiac Enzymes: No results for input(s): CKTOTAL, CKMB, CKMBINDEX, TROPONINI in the last 168 hours. BNP: Invalid input(s): POCBNP CBG: Recent Labs   Lab 12/22/18 1632 12/22/18 2043 12/23/18 0024 12/23/18 0448 12/23/18 0805  GLUCAP 345* 292* 377* 272* 226*   D-Dimer No results for input(s): DDIMER in the last 72 hours. Hgb A1c No results for input(s): HGBA1C in the last 72 hours. Lipid Profile No results for input(s): CHOL, HDL, LDLCALC, TRIG, CHOLHDL, LDLDIRECT in the last 72 hours. Thyroid function studies No results for input(s): TSH, T4TOTAL, T3FREE, THYROIDAB in the last 72 hours.  Invalid input(s): FREET3 Anemia work up No results for input(s): VITAMINB12, FOLATE, FERRITIN, TIBC, IRON, RETICCTPCT in the last 72 hours. Urinalysis No results found for: COLORURINE, APPEARANCEUR, LABSPEC, [place], GLUCOSEU, HGBUR, BILIRUBINUR, KETONESUR, PROTEINUR, UROBILINOGEN, NITRITE, [place]  Microbiology No results found for this or any previous visit (from the past 240 hour(s)).  Time coordinating discharge: Approximately 40 minutes  Patrecia Pour, MD  [facility] 12/23/2018, 2:28 PM Pager [phone]

## 2018-12-23 NOTE — Progress Notes (Signed)
[place] for IV heparin ( on PTA xarelto) Indication: DVT  No Known Allergies  Patient Measurements: Height: 6\' 4"  (193 cm) Weight: 214 lb 15.2 oz (97.5 kg) IBW/kg (Calculated) : 86.8 Heparin Dosing Weight: actual (97 kg)  Vital Signs: Temp: 98 F (36.7 C) (02/20 2041) Temp Source: Oral (02/20 2041) BP: 129/73 (02/20 2041) Pulse Rate: 81 (02/20 2041)  Labs: Recent Labs    12/20/18 0626 12/20/18 1935 12/21/18 0615 12/22/18 0028 12/22/18 0159 12/22/18 1635 12/23/18 0014  HGB 6.5*  --  8.0* 9.2* 7.8* 8.5*  --   HCT 21.1*  --  25.0* 30.2* 24.6* 27.2*  --   PLT 303  --  245  --  305  --   --   APTT 33 92*  --   --  80*  --  65*  LABPROT 15.3*  --   --   --   --   --   --   INR 1.22  --   --   --   --   --   --   HEPARINUNFRC 0.53 0.41  --   --  0.21*  --  0.18*  CREATININE 0.54*  --   --   --  1.00  --   --    Estimated Creatinine Clearance: 98.9 mL/min (by C-G formula based on SCr of 1 mg/dL).  Medical History: Past Medical History:  Diagnosis Date  . Depression   . Diabetes mellitus without complication ([place])   . Dyslipidemia   . Erectile dysfunction   . Nicotine addiction    Assessment: 42 yoM with hx of left DVT in 2017 and 2018 on xarelto. LD xarelto 2/17 10 am.  Baseline labs: H/H=7.3/24 Plts=313 Aptt=33 HL=0.53 INR=1.47  2/20  Heparin stopped over night after blood BM. Occult stool sample was positive  Hgb dropped over night to 7.8, Plt wnl Today, 2/21  0033 HL = 0.18 below goal, no infusion or bleeding issues per RN. Hgb = 8.5.   Goal of Therapy:  aPTT 66-102 Heparin level 0.3-0.7 units/ml Monitor platelets by anticoagulation protocol: Yes   Plan:    Increase heparin drip to 1700 units/hr   Recheck HL in 6 hours  Monitor closely  for signs and symptoms of bleeding   Dorrene German 12/23/2018 1:07 AM

## 2018-12-23 NOTE — Care Management Note (Signed)
Case Management Note  Patient Details  Name: Brian Torres MRN: [id number] Date of Birth: [birth year]  Subjective/Objective:                  DISCHARGE PLANNING  Action/Plan: Discharged to home with self-care, orders checked for hhc needs. No CM needs present at time of discharge.  Patient is able to arrangement own appointments and home care.  Expected Discharge Date:  12/23/18               Expected Discharge Plan:  Home/Self Care  In-House Referral:     Discharge planning Services  CM Consult  Post Acute Care Choice:    Choice offered to:     DME Arranged:    DME Agency:     HH Arranged:    HH Agency:     Status of Service:  Completed, signed off  If discussed at [employer] of Stay Meetings, dates discussed:    Additional Comments:  Leeroy Cha, RN 12/23/2018, 9:42 AM

## 2018-12-23 NOTE — Progress Notes (Signed)
[place] for IV heparin/Eliquis ( on PTA xarelto) Indication: DVT  No Known Allergies  Patient Measurements: Height: 6\' 4"  (193 cm) Weight: 214 lb 15.2 oz (97.5 kg) IBW/kg (Calculated) : 86.8 Heparin Dosing Weight: actual (97 kg)  Vital Signs: Temp: 98 F (36.7 C) (02/21 0445) Temp Source: Oral (02/21 0445) BP: 114/60 (02/21 0445) Pulse Rate: 71 (02/21 0445)  Labs: Recent Labs    12/20/18 1935 12/21/18 0615  12/22/18 0159 12/22/18 1635 12/23/18 0014 12/23/18 0746  HGB  --  8.0*   < > 7.8* 8.5*  --  8.3*  HCT  --  25.0*   < > 24.6* 27.2*  --  25.9*  PLT  --  245  --  305  --   --  282  APTT 92*  --   --  80*  --  65*  --   HEPARINUNFRC 0.41  --   --  0.21*  --  0.18* 0.35  CREATININE  --   --   --  1.00  --   --  0.80   < > = values in this interval not displayed.   Estimated Creatinine Clearance: 123.6 mL/min (by C-G formula based on SCr of 0.8 mg/dL).  Medical History: Past Medical History:  Diagnosis Date  . Depression   . Diabetes mellitus without complication ([place])   . Dyslipidemia   . Erectile dysfunction   . Nicotine addiction    Assessment: 64 yoM with hx of left DVT in 2017 and 2018 on xarelto. LD xarelto 2/17 10 am.  Baseline labs: H/H=7.3/24 Plts=313 Aptt=33 HL=0.53 INR=1.47  2/20  Heparin stopped over night after blood BM. Occult stool sample was positive  Hgb dropped over night to 7.8, Plt wnl Today, 2/21  0033 HL = 0.18 below goal, no infusion or bleeding issues per RN. Hgb = 8.5.  AM labs HL is 0.35, therapeutic. No noted bleeding issues,   SCr stable  Plan to transition from Xarelto to Eliquis in light of hepatic dysfunction   Goal of Therapy:  aPTT 66-102 Heparin level 0.3-0.7 units/ml Monitor platelets by anticoagulation protocol: Yes   Plan:    Stop heparin and start Eliquis 5 mg PO BID. Pt had been on Xarelto PTA as well as heparin here, so did not feel the need to 10 mg PO BID x 7  days dosing, especially with recent GI bleed issues. Discussed this as well with Dr. Bonner Puna  Pt education provided. Free voucher and discount card provided.  Monitor closely  for signs and symptoms of bleeding    Royetta Asal, PharmD, BCPS Pager [phone] 12/23/2018 9:51 AM

## 2018-12-23 NOTE — Progress Notes (Signed)
This shift pt indicated no bloody stool.  Heparin drip increased to 1700 u/hr per call from pharmacy. Will continue to monitor

## 2018-12-28 ENCOUNTER — Other Ambulatory Visit: Payer: Self-pay | Admitting: Gastroenterology

## 2018-12-28 ENCOUNTER — Ambulatory Visit
Admission: RE | Admit: 2018-12-28 | Discharge: 2018-12-28 | Disposition: A | Discharge status: Home / Self Care | Payer: Managed Care, Other (non HMO) | Source: Ambulatory Visit | Attending: Gastroenterology | Admitting: Gastroenterology

## 2018-12-28 DIAGNOSIS — Q250 Patent ductus arteriosus: Secondary | ICD-10-CM

## 2019-01-02 ENCOUNTER — Other Ambulatory Visit: Payer: Self-pay | Admitting: Gastroenterology

## 2019-01-02 ENCOUNTER — Encounter (HOSPITAL_COMMUNITY): Payer: Self-pay

## 2019-01-02 ENCOUNTER — Other Ambulatory Visit: Payer: Self-pay

## 2019-01-03 ENCOUNTER — Encounter (INDEPENDENT_AMBULATORY_CARE_PROVIDER_SITE_OTHER): Payer: Self-pay

## 2019-01-03 ENCOUNTER — Ambulatory Visit (HOSPITAL_COMMUNITY): Payer: Managed Care, Other (non HMO) | Admitting: Anesthesiology

## 2019-01-03 ENCOUNTER — Encounter (HOSPITAL_COMMUNITY)
Admission: RE | Disposition: A | Discharge status: Home / Self Care | Payer: Self-pay | Source: Home / Self Care | Attending: Gastroenterology

## 2019-01-03 ENCOUNTER — Encounter (HOSPITAL_COMMUNITY): Payer: Self-pay | Admitting: *Deleted

## 2019-01-03 ENCOUNTER — Other Ambulatory Visit: Payer: Self-pay

## 2019-01-03 ENCOUNTER — Ambulatory Visit (HOSPITAL_COMMUNITY)
Admission: RE | Admit: 2019-01-03 | Discharge: 2019-01-03 | Disposition: A | Discharge status: Home / Self Care | Payer: Managed Care, Other (non HMO) | Attending: Gastroenterology | Admitting: Gastroenterology

## 2019-01-03 DIAGNOSIS — Z7901 Long term (current) use of anticoagulants: Secondary | ICD-10-CM | POA: Diagnosis not present

## 2019-01-03 DIAGNOSIS — E785 Hyperlipidemia, unspecified: Secondary | ICD-10-CM | POA: Insufficient documentation

## 2019-01-03 DIAGNOSIS — I129 Hypertensive chronic kidney disease with stage 1 through stage 4 chronic kidney disease, or unspecified chronic kidney disease: Secondary | ICD-10-CM | POA: Diagnosis not present

## 2019-01-03 DIAGNOSIS — N183 Chronic kidney disease, stage 3 (moderate): Secondary | ICD-10-CM | POA: Insufficient documentation

## 2019-01-03 DIAGNOSIS — Z794 Long term (current) use of insulin: Secondary | ICD-10-CM | POA: Insufficient documentation

## 2019-01-03 DIAGNOSIS — E1122 Type 2 diabetes mellitus with diabetic chronic kidney disease: Secondary | ICD-10-CM | POA: Insufficient documentation

## 2019-01-03 DIAGNOSIS — K831 Obstruction of bile duct: Secondary | ICD-10-CM | POA: Insufficient documentation

## 2019-01-03 DIAGNOSIS — N529 Male erectile dysfunction, unspecified: Secondary | ICD-10-CM | POA: Diagnosis not present

## 2019-01-03 DIAGNOSIS — F17210 Nicotine dependence, cigarettes, uncomplicated: Secondary | ICD-10-CM | POA: Insufficient documentation

## 2019-01-03 DIAGNOSIS — R933 Abnormal findings on diagnostic imaging of other parts of digestive tract: Secondary | ICD-10-CM | POA: Diagnosis not present

## 2019-01-03 DIAGNOSIS — D631 Anemia in chronic kidney disease: Secondary | ICD-10-CM | POA: Diagnosis not present

## 2019-01-03 DIAGNOSIS — Z7982 Long term (current) use of aspirin: Secondary | ICD-10-CM | POA: Insufficient documentation

## 2019-01-03 DIAGNOSIS — I82502 Chronic embolism and thrombosis of unspecified deep veins of left lower extremity: Secondary | ICD-10-CM | POA: Diagnosis not present

## 2019-01-03 DIAGNOSIS — Z79899 Other long term (current) drug therapy: Secondary | ICD-10-CM | POA: Insufficient documentation

## 2019-01-03 DIAGNOSIS — K869 Disease of pancreas, unspecified: Secondary | ICD-10-CM | POA: Diagnosis present

## 2019-01-03 DIAGNOSIS — Z833 Family history of diabetes mellitus: Secondary | ICD-10-CM | POA: Insufficient documentation

## 2019-01-03 HISTORY — PX: FINE NEEDLE ASPIRATION: SHX5430

## 2019-01-03 HISTORY — PX: EUS: SHX5427

## 2019-01-03 HISTORY — PX: ESOPHAGOGASTRODUODENOSCOPY: SHX5428

## 2019-01-03 LAB — GLUCOSE, CAPILLARY: Glucose-Capillary: 223 mg/dL — ABNORMAL HIGH (ref 70–99)

## 2019-01-03 SURGERY — ULTRASOUND, UPPER GI TRACT, ENDOSCOPIC
Anesthesia: Monitor Anesthesia Care

## 2019-01-03 MED ORDER — PROPOFOL 10 MG/ML IV BOLUS
INTRAVENOUS | Status: AC
  Filled 2019-01-03: qty 20

## 2019-01-03 MED ORDER — ONDANSETRON HCL 4 MG/2ML IJ SOLN
INTRAMUSCULAR | Status: DC | PRN
  Administered 2019-01-03: 4 mg via INTRAVENOUS

## 2019-01-03 MED ORDER — MIDAZOLAM HCL 5 MG/5ML IJ SOLN
INTRAMUSCULAR | Status: DC | PRN
  Administered 2019-01-03: 2 mg via INTRAVENOUS

## 2019-01-03 MED ORDER — FENTANYL CITRATE (PF) 100 MCG/2ML IJ SOLN
INTRAMUSCULAR | Status: AC
  Filled 2019-01-03: qty 2

## 2019-01-03 MED ORDER — SODIUM CHLORIDE 0.9 % IV SOLN: INTRAVENOUS | Status: DC

## 2019-01-03 MED ORDER — LACTATED RINGERS IV SOLN
INTRAVENOUS | Status: DC
  Administered 2019-01-03: 15:00:00 via INTRAVENOUS
  Administered 2019-01-03: 1000 mL via INTRAVENOUS

## 2019-01-03 MED ORDER — PROPOFOL 500 MG/50ML IV EMUL
INTRAVENOUS | Status: DC | PRN
  Administered 2019-01-03: 140 ug/kg/min via INTRAVENOUS

## 2019-01-03 MED ORDER — PROPOFOL 10 MG/ML IV BOLUS
INTRAVENOUS | Status: DC | PRN
  Administered 2019-01-03: 20 mg via INTRAVENOUS
  Administered 2019-01-03: 30 mg via INTRAVENOUS
  Administered 2019-01-03: 50 mg via INTRAVENOUS
  Administered 2019-01-03: 20 mg via INTRAVENOUS

## 2019-01-03 MED ORDER — PROPOFOL 10 MG/ML IV BOLUS
INTRAVENOUS | Status: AC
  Filled 2019-01-03: qty 40

## 2019-01-03 MED ORDER — APIXABAN 5 MG PO TABS: 5.0000 mg | ORAL_TABLET | Freq: Two times a day (BID) | ORAL | 0 refills | Status: DC

## 2019-01-03 MED ORDER — MIDAZOLAM HCL 2 MG/2ML IJ SOLN
INTRAMUSCULAR | Status: AC
  Filled 2019-01-03: qty 2

## 2019-01-03 NOTE — Transfer of Care (Signed)
Immediate Anesthesia Transfer of Care Note  Patient: Brian Torres  Procedure(s) Performed: Procedure(s) with comments: FULL UPPER ENDOSCOPIC ULTRASOUND (EUS) Linear not radial (N/A) ENDOSCOPIC RETROGRADE CHOLANGIOPANCREATOGRAPHY (ERCP) (N/A) - possible ERCP FINE NEEDLE ASPIRATION (FNA) LINEAR (N/A)  Patient Location: PACU  Anesthesia Type:MAC  Level of Consciousness:  sedated, patient cooperative and responds to stimulation  Airway & Oxygen Therapy:Patient Spontanous Breathing and Patient connected to face mask oxgen  Post-op Assessment:  Report given to PACU RN and Post -op Vital signs reviewed and stable  Post vital signs:  Reviewed and stable  Last Vitals:  Vitals:   01/03/19 1241  BP: 120/71  Pulse: 74  Resp: 15  Temp: 36.8 C  SpO2: 672%    Complications: No apparent anesthesia complications

## 2019-01-03 NOTE — Interval H&P Note (Signed)
History and Physical Interval Note:  01/03/2019 1:25 PM  Brian Torres  has presented today for surgery, with the diagnosis of pancreatic mass  The various methods of treatment have been discussed with the patient and family. After consideration of risks, benefits and other options for treatment, the patient has consented to  Procedure(s) with comments: FULL UPPER ENDOSCOPIC ULTRASOUND (EUS) Linear not radial (N/A) ENDOSCOPIC RETROGRADE CHOLANGIOPANCREATOGRAPHY (ERCP) (N/A) - possible ERCP as a surgical intervention .  The patient's history has been reviewed, patient examined, no change in status, stable for surgery.  I have reviewed the patient's chart and labs.  Questions were answered to the patient's satisfaction.     [name]  Assessment:  1.  Obstructive jaundice with pancreatic mass.  2.  Elevated Ca 19-9. 3.  Bile duct stent in place  Plan:  1.  Endoscopic ultrasound with possible fine needle aspiration. 2.  Risks (bleeding, infection, bowel perforation that could require surgery, sedation-related changes in cardiopulmonary systems), benefits (identification and possible treatment of source of symptoms, exclusion of certain causes of symptoms), and alternatives (watchful waiting, radiographic imaging studies, empiric medical treatment) of upper endoscopy with ultrasound and possible (EUS with possible fine needle aspiration) were explained to patient/family in detail and patient wishes to proceed. 3.  If I can't get good visualization with stent in place, patient/wife are aware that I would plan to remove bile duct stent and retry EUS +/- FNA without stent in place. 4.  If bile duct stent has to be removed, we would then convert propofol sedation to general anesthesia, place patient then in fluoroscopy bed in prone position, and proceed with ERCP for bile duct stent replacement. 5.  Risks (up to and including bleeding, infection, perforation, pancreatitis that can be  complicated by infected necrosis and death), benefits (removal of stones, alleviating blockage, decreasing risk of cholangitis or choledocholithiasis-related pancreatitis), and alternatives (watchful waiting, percutaneous transhepatic cholangiography) of ERCP were explained to patient/family in detail and patient elects to proceed.

## 2019-01-03 NOTE — Anesthesia Preprocedure Evaluation (Signed)
Anesthesia Evaluation  Patient identified by MRN, date of birth, ID band Patient awake    Reviewed: Allergy & Precautions, NPO status , Patient's Chart, lab work & pertinent test results  Airway Mallampati: II  TM Distance: >3 FB Neck ROM: Full    Dental no notable dental hx.    Pulmonary Current Smoker,    Pulmonary exam normal breath sounds clear to auscultation       Cardiovascular hypertension, + DVT  Normal cardiovascular exam Rhythm:Regular Rate:Normal     Neuro/Psych negative neurological ROS  negative psych ROS   GI/Hepatic negative GI ROS, Neg liver ROS,   Endo/Other  diabetes  Renal/GU negative Renal ROS  negative genitourinary   Musculoskeletal negative musculoskeletal ROS (+)   Abdominal   Peds negative pediatric ROS (+)  Hematology   Anesthesia Other Findings   Reproductive/Obstetrics negative OB ROS                             Anesthesia Physical Anesthesia Plan  ASA: III  Anesthesia Plan: MAC   Post-op Pain Management:    Induction: Intravenous  PONV Risk Score and Plan: Ondansetron and Treatment may vary due to age or medical condition  Airway Management Planned: Simple Face Mask  Additional Equipment:   Intra-op Plan:   Post-operative Plan: Extubation in OR  Informed Consent: I have reviewed the patients History and Physical, chart, labs and discussed the procedure including the risks, benefits and alternatives for the proposed anesthesia with the patient or authorized representative who has indicated his/her understanding and acceptance.     Dental advisory given  Plan Discussed with: CRNA and Surgeon  Anesthesia Plan Comments: (Possible GA if proceed to ERCP)        Anesthesia Quick Evaluation

## 2019-01-03 NOTE — Anesthesia Postprocedure Evaluation (Signed)
Anesthesia Post Note  Patient: Brian Torres  Procedure(s) Performed: FULL UPPER ENDOSCOPIC ULTRASOUND (EUS) Linear not radial (N/A ) FINE NEEDLE ASPIRATION (FNA) LINEAR (N/A )     Patient location during evaluation: Endoscopy Anesthesia Type: MAC Level of consciousness: awake and alert Pain management: pain level controlled Vital Signs Assessment: post-procedure vital signs reviewed and stable Respiratory status: spontaneous breathing, nonlabored ventilation, respiratory function stable and patient connected to nasal cannula oxygen Cardiovascular status: blood pressure returned to baseline and stable Postop Assessment: no apparent nausea or vomiting Anesthetic complications: no    Last Vitals:  Vitals:   01/03/19 1520 01/03/19 1530  BP: 116/71 120/81  Pulse: 60 66  Resp: 14 16  Temp:    SpO2: 100% 100%    Last Pain:  Vitals:   01/03/19 1530  TempSrc:   PainSc: 0-No pain                 Barnet Glasgow

## 2019-01-03 NOTE — Discharge Instructions (Signed)

## 2019-01-04 NOTE — Op Note (Signed)
[facility] Patient Name: Brian Torres Procedure Date: 01/03/2019 MRN: [id number] Attending MD: Arta Silence , MD Date of Birth: [birth year] CSN: [id number] Age: 58 Admit Type: Outpatient Procedure:                Upper EUS Indications:              Suspected mass in pancreas on MRI Providers:                Arta Silence, MD, Cleda Daub, RN, Tinnie Gens,                            Technician, Arnoldo Hooker, CRNA Referring MD:             Clarene Essex, MD Medicines:                Monitored Anesthesia Care Complications:            No immediate complications. Estimated Blood Loss:     Estimated blood loss: none. Procedure:                Pre-Anesthesia Assessment:                           - Prior to the procedure, a History and Physical                            was performed, and patient medications and                            allergies were reviewed. The patient's tolerance of                            previous anesthesia was also reviewed. The risks                            and benefits of the procedure and the sedation                            options and risks were discussed with the patient.                            All questions were answered, and informed consent                            was obtained. Prior Anticoagulants: The patient has                            taken Eliquis (apixaban), last dose was 3 days                            prior to procedure. ASA Grade Assessment: III - A                            patient with severe systemic disease. After  reviewing the risks and benefits, the patient was                            deemed in satisfactory condition to undergo the                            procedure.                           After obtaining informed consent, the endoscope was                            passed under direct vision. Throughout the                            procedure, the patient's blood  pressure, pulse, and                            oxygen saturations were monitored continuously. The                            [id number] ([id number]) Olympus Linear EUS was                            introduced through the mouth, and advanced to the                            second part of duodenum. The upper EUS was                            accomplished without difficulty. The patient                            tolerated the procedure well. Scope In: Scope Out: Findings:      ENDOSONOGRAPHIC FINDING: :      Anechoic lesions suggestive of a few cysts were identified in the       pancreatic body and pancreatic tail.      One stent was visualized endosonographically in the common bile duct.       Extension of the stent was noted in the common bile duct into the       duodenum. Periampullary diverticulum seen.      An irregular mass was identified in the pancreatic head. The mass was       hypoechoic, well-defined, hard to measure due to artifact from the bile       duct stent, but mass appears to measure at least 25 mm by 20 mm in       maximal cross-sectional diameter. The endosonographic borders were       poorly-defined. The remainder of the pancreas was examined. The       endosonographic appearance of parenchyma and the upstream pancreatic       duct indicated duct dilation. Fine needle aspiration for cytology was       performed. Lesion appears to abut, or potentially superficially invade,       the superior mesenteric vein. Did not see any lymphadenopathy; could not  appreciate any involvement of superior mesenteric artery or celiac       artery. Color Doppler imaging was utilized prior to needle puncture to       confirm a lack of significant vascular structures within the needle       path. Four passes were made with the 25 gauge needle using a       transduodenal approach. A stylet was used. A cytologist was present and       performed a preliminary cytologic examination.  Final cytology results       are pending. Estimated blood loss was minimal.      There was no sign of significant endosonographic abnormality in the left       lobe of the liver. Homogeneous parenchyma was identified. Impression:               - A few cystic lesions were seen in the pancreatic                            body and pancreatic tail.                           - One stent was visualized endosonographically in                            the common bile duct.                           - A mass was identified in the pancreatic head. If                            adenocarcinoma, the lesion would be staged T3 N0 Mx                            by endosonographic criteria. Fine needle aspiration                            performed.                           - There was no evidence of significant pathology in                            the left lobe of the liver. Moderate Sedation:      Not Applicable - Patient had care per Anesthesia. Recommendation:           - Discharge patient to home (via wheelchair).                           - Soft diet today.                           - Continue present medications.                           - Resume Eliquis (apixaban) at prior dose tomorrow.                           -  Await cytology results.                           - Return to GI clinic after studies are complete.                           - Return to referring physician as previously                            scheduled. Procedure Code(s):        --- Professional ---                           [phone], Esophagogastroduodenoscopy, flexible,                            transoral; with transendoscopic ultrasound-guided                            intramural or transmural fine needle                            aspiration/biopsy(s), (includes endoscopic                            ultrasound examination limited to the esophagus,                            stomach or duodenum, and adjacent  structures) Diagnosis Code(s):        --- Professional ---                           I14.4, Cyst of pancreas                           K86.89, Other specified diseases of pancreas                           R93.3, Abnormal findings on diagnostic imaging of                            other parts of digestive tract CPT copyright 2018 [employer]. All rights reserved. The codes documented in this report are preliminary and upon coder review may  be revised to meet current compliance requirements. Arta Silence, MD 01/03/2019 3:23:34 PM This report has been signed electronically. Number of Addenda: 0

## 2019-01-05 ENCOUNTER — Encounter (HOSPITAL_COMMUNITY): Payer: Self-pay | Admitting: Gastroenterology

## 2019-01-06 ENCOUNTER — Telehealth: Payer: Self-pay | Admitting: Nurse Practitioner

## 2019-01-06 NOTE — Telephone Encounter (Signed)
A new patient appt has been scheduled for the pt to see Artist Beach on 3/11 at 10:15am. I cld Mr. Halling and gave the appt date and time. He's been made aware to arrive 15 minutes early to be checked in on time.

## 2019-01-11 ENCOUNTER — Encounter: Payer: Self-pay | Admitting: Nurse Practitioner

## 2019-01-11 ENCOUNTER — Other Ambulatory Visit: Payer: Self-pay

## 2019-01-11 ENCOUNTER — Inpatient Hospital Stay: Payer: Managed Care, Other (non HMO) | Attending: Nurse Practitioner | Admitting: Nurse Practitioner

## 2019-01-11 ENCOUNTER — Telehealth: Payer: Self-pay

## 2019-01-11 ENCOUNTER — Telehealth: Payer: Self-pay | Admitting: Nurse Practitioner

## 2019-01-11 VITALS — BP 137/80 | HR 88 | Temp 98.4°F | Resp 18 | Ht 76.0 in | Wt 200.7 lb

## 2019-01-11 DIAGNOSIS — K2980 Duodenitis without bleeding: Secondary | ICD-10-CM

## 2019-01-11 DIAGNOSIS — C250 Malignant neoplasm of head of pancreas: Secondary | ICD-10-CM | POA: Insufficient documentation

## 2019-01-11 DIAGNOSIS — D539 Nutritional anemia, unspecified: Secondary | ICD-10-CM

## 2019-01-11 DIAGNOSIS — F17210 Nicotine dependence, cigarettes, uncomplicated: Secondary | ICD-10-CM

## 2019-01-11 DIAGNOSIS — D649 Anemia, unspecified: Secondary | ICD-10-CM | POA: Insufficient documentation

## 2019-01-11 DIAGNOSIS — R17 Unspecified jaundice: Secondary | ICD-10-CM | POA: Diagnosis not present

## 2019-01-11 DIAGNOSIS — Z9689 Presence of other specified functional implants: Secondary | ICD-10-CM

## 2019-01-11 DIAGNOSIS — I129 Hypertensive chronic kidney disease with stage 1 through stage 4 chronic kidney disease, or unspecified chronic kidney disease: Secondary | ICD-10-CM

## 2019-01-11 DIAGNOSIS — N183 Chronic kidney disease, stage 3 (moderate): Secondary | ICD-10-CM

## 2019-01-11 DIAGNOSIS — K8689 Other specified diseases of pancreas: Secondary | ICD-10-CM | POA: Diagnosis not present

## 2019-01-11 DIAGNOSIS — R7989 Other specified abnormal findings of blood chemistry: Secondary | ICD-10-CM

## 2019-01-11 DIAGNOSIS — I708 Atherosclerosis of other arteries: Secondary | ICD-10-CM | POA: Diagnosis not present

## 2019-01-11 DIAGNOSIS — Z7289 Other problems related to lifestyle: Secondary | ICD-10-CM

## 2019-01-11 DIAGNOSIS — J9811 Atelectasis: Secondary | ICD-10-CM

## 2019-01-11 DIAGNOSIS — K831 Obstruction of bile duct: Secondary | ICD-10-CM

## 2019-01-11 DIAGNOSIS — Z7901 Long term (current) use of anticoagulants: Secondary | ICD-10-CM | POA: Insufficient documentation

## 2019-01-11 DIAGNOSIS — N189 Chronic kidney disease, unspecified: Secondary | ICD-10-CM | POA: Insufficient documentation

## 2019-01-11 DIAGNOSIS — E1122 Type 2 diabetes mellitus with diabetic chronic kidney disease: Secondary | ICD-10-CM

## 2019-01-11 DIAGNOSIS — Z86718 Personal history of other venous thrombosis and embolism: Secondary | ICD-10-CM

## 2019-01-11 NOTE — Progress Notes (Addendum)
New Hematology/Oncology Consult   Requesting MD:  [phone]      Reason for Consult: Pancreas mass  HPI: Brian Torres is a 58 year old man with a history of type 2 diabetes, hypertension, chronic kidney disease, left lower extremity DVT in 2017 and 2018.  He reports noticing jaundice in early to mid January.  He was scheduled to go on a cruise in mid February.  Before boarding the cruise he noted swelling in the right leg and was concerned he had a blood clot.  He was evaluated in [place].  It was recommended he return to [facility] for further evaluation.  He reports a Doppler study was not performed on the right leg.  He presented to the emergency department 12/19/2018.  Total bilirubin was markedly elevated at 21.4, other LFTs elevated as well.  Also noted to be anemic with a hemoglobin of 7.3.  He received a blood transfusion in the hospital.  CTs showed severe intrahepatic and extrahepatic biliary dilatation due to a possible 3.5 x 2.5 cm solid mass in the pancreatic head.  Pancreatic ductal dilatation noted as well with multiple cystic lesions in the pancreatic tail and body.  There were occasional nonspecific small pulmonary nodules felt to likely be related to prior infection or inflammation.  He underwent an ERCP by Dr. Watt Climes 12/21/2018.  The major papilla was on the rim of a diverticulum.  The minor papilla appeared to be bulging.  A biopsy of the duodenum adjacent to the minor papilla was performed.  Plastic stent was placed into the ventral pancreatic duct.  Biliary sphincterotomy performed.  Covered metal stent placed into the common bile duct.  Bile duct brushings showed benign reactive/reparative changes.  Biopsy of polypoid duodenal mucosa suggestive of nodular peptic duodenitis, negative for dysplasia or malignancy.  He was discharged home upper 12/23/2018 at which time the bilirubin was 8.3.  EUS by Dr. Paulita Fujita on 01/03/2019 showed a few cystic lesions in the pancreatic body and pancreatic  tail.  A stent was visualized in the common bile duct.  Mass identified in the pancreatic head with fine-needle aspiration performed.  Pathology returned suspicious for malignancy.    Past Medical History:  Diagnosis Date  . Depression   . Diabetes mellitus without complication ([place])    type 2  . Dyslipidemia   . Erectile dysfunction   . Nicotine addiction   : Left lower extremity DVT December 2017, December 2018  Past Surgical History:  Procedure Laterality Date  . BILIARY STENT PLACEMENT N/A 12/21/2018   Procedure: BILIARY STENT PLACEMENT;  Surgeon: Clarene Essex, MD;  Location: [facility];  Service: Endoscopy;  Laterality: N/A;  . BIOPSY  12/21/2018   Procedure: BIOPSY;  Surgeon: Clarene Essex, MD;  Location: [facility];  Service: Endoscopy;;  . ENDOSCOPIC RETROGRADE CHOLANGIOPANCREATOGRAPHY (ERCP) WITH PROPOFOL N/A 12/21/2018   Procedure: ENDOSCOPIC RETROGRADE CHOLANGIOPANCREATOGRAPHY (ERCP) WITH PROPOFOL;  Surgeon: Clarene Essex, MD;  [facility];  Service: Endoscopy;  Laterality: N/A;  . ESOPHAGOGASTRODUODENOSCOPY N/A 01/03/2019   Procedure: ESOPHAGOGASTRODUODENOSCOPY (EGD);  Surgeon: Arta Silence, MD;  Location: [name];  Service: Endoscopy;  Laterality: N/A;  . EUS N/A 01/03/2019   Procedure: FULL UPPER ENDOSCOPIC ULTRASOUND (EUS) Linear not radial;  Surgeon: Arta Silence, MD;  Location: [facility];  Service: Endoscopy;  Laterality: N/A;  . FINE NEEDLE ASPIRATION N/A 01/03/2019   Procedure: FINE NEEDLE ASPIRATION (FNA) LINEAR;  Surgeon: Arta Silence, MD;  [facility]: [facility];  Service: Endoscopy;  Laterality: N/A;  . LOWER EXTREMITY ANGIOGRAPHY N/A 10/18/2017  Procedure: LOWER EXTREMITY ANGIOGRAPHY - IVUS - LYSIS CATH;  Surgeon: Waynetta Sandy, MD;  Location: [place] [facility];  Service: Cardiovascular;  Laterality: N/A;  . LOWER EXTREMITY ANGIOGRAPHY N/A 10/19/2017   Procedure: LOWER EXTREMITY ANGIOGRAPHY - LYSIS RECHECK;  Surgeon: Waynetta Sandy, MD;  Location: [place] [facility];  Service: Cardiovascular;  Laterality: N/A;  . PANCREATIC STENT PLACEMENT  12/21/2018   Procedure: PANCREATIC STENT PLACEMENT;  Surgeon: Clarene Essex, MD;  [facility];  Service: Endoscopy;;  . PERIPHERAL VASCULAR BALLOON ANGIOPLASTY Left 10/19/2017   Procedure: PERIPHERAL VASCULAR BALLOON ANGIOPLASTY;  Surgeon: Waynetta Sandy, MD;  Location: [place] [facility];  Service: Cardiovascular;  Laterality: Left;  Multiple Lower extremity venous sites  . PERIPHERAL VASCULAR INTERVENTION Left 10/18/2017   Procedure: PERIPHERAL VASCULAR INTERVENTION;  Surgeon: Waynetta Sandy, MD;  Location: [place] [facility];  Service: Cardiovascular;  Laterality: Left;  . REMOVAL OF STONES  12/21/2018   Procedure: REMOVAL OF STONES;  Surgeon: Clarene Essex, MD;  [facility];  Service: Endoscopy;;  . Joan Mayans  12/21/2018   Procedure: Joan Mayans;  Surgeon: Clarene Essex, MD;  [facility];  Service: Endoscopy;;  :   Current Outpatient Medications:  .  apixaban (ELIQUIS) 5 MG TABS tablet, Take 1 tablet (5 mg total) by mouth 2 (two) times daily., Disp: 60 tablet, Rfl: 0 .  b complex vitamins tablet, Take 1 tablet by mouth daily., Disp: , Rfl:  .  CINNAMON PO, Take 1,000 mg by mouth 2 (two) times daily., Disp: , Rfl:  .  co-enzyme Q-10 30 MG capsule, Take 30 mg by mouth daily. , Disp: , Rfl:  .  ferrous sulfate 325 (65 FE) MG EC tablet, Take 325 mg by mouth daily with breakfast., Disp: , Rfl:  .  glimepiride (AMARYL) 4 MG tablet, Take 4 mg by mouth daily with breakfast. , Disp: , Rfl:  .  Insulin Glargine (BASAGLAR KWIKPEN) 100 UNIT/ML SOPN, Inject 16 Units into the skin at bedtime., Disp: , Rfl:  .  lisinopril (PRINIVIL,ZESTRIL) 10 MG tablet, Take 10 mg by mouth daily., Disp: , Rfl:  .  metFORMIN (GLUCOPHAGE) 1000 MG tablet, Take 1,000 mg by mouth 2 (two) times daily., Disp: , Rfl:  .  Multiple Vitamins-Minerals  (MULTI FOR HIM 50+ PO), Take by mouth., Disp: , Rfl:  .  Omega-3 Fatty Acids (FISH OIL) 1000 MG CAPS, Take 1,000 mg by mouth 2 (two) times daily. , Disp: , Rfl:  .  pioglitazone (ACTOS) 45 MG tablet, Take 45 mg by mouth daily., Disp: , Rfl:  .  sildenafil (REVATIO) 20 MG tablet, Take 60-100 mg by mouth daily as needed (erectile dysfunction)., Disp: , Rfl: :  :  No Known Allergies  FH: Mother with diabetes mellitus; father deceased age 65 with heart failure; 2 brothers both reported to be healthy.  No family history of cancer.  SOCIAL HISTORY: He lives in [place].  He is married.  He has 3 children all reported to be in good health.  He is retired from [employer].  Tobacco use reported as 1 pack/day for "a lot of years".  Remote alcohol use.  Review of Systems: He reports a good appetite.  He estimates 13 pound weight loss over the past few months.  He denies pain.  No fevers or sweats.  He saw blood on 2 occasions after the ERCP.  Otherwise no bleeding.  Specifically no black stools.  No dysphagia.  No  nausea or vomiting.  No change in bowel habits.  He denies shortness of breath.  He has had a slight cough for the past week which he attributes to allergies.  No chest pain.  Left leg is chronically larger than the right leg.  No hematuria or dysuria.  Urine has returned to a normal color.  Physical Exam:  There were no vitals taken for this visit.  HEENT: No scleral icterus.  No thrush or ulcers. Lungs: Lungs clear bilaterally. Cardiac: Regular rate and rhythm. Abdomen: Abdomen soft and nontender.  No hepatomegaly.  No mass.  Vascular: Wearing a compression stocking on the left leg.  Trace edema right lower leg. Lymph nodes: No palpable cervical, supraclavicular, axillary or inguinal lymph nodes. Neurologic: Alert and oriented.  Motor strength 5/5. Skin: Mild jaundice.  LABS:     RADIOLOGY:  Dg Abd 1 View  Result Date: 12/29/2018 CLINICAL DATA:  Prior history of  jaundice and biliary stent placement. EXAM: ABDOMEN - 1 VIEW COMPARISON:  12/21/2018.  CT 12/20/2018. FINDINGS: Common bile duct stent noted in the right upper quadrant in good anatomic position by plain film exam. Stool noted throughout the colon. No bowel distention. Aortoiliac and visceral atherosclerotic vascular calcification. Degenerative change lumbar spine and both hips. IMPRESSION: Common bile duct stent noted in the right upper quadrant in good anatomic position. Electronically Signed   By: Marcello Moores  Register   On: 12/29/2018 05:33   Ct Chest W Contrast  Result Date: 12/20/2018 CLINICAL DATA:  Staging possible pancreatic cancer EXAM: CT CHEST WITH CONTRAST TECHNIQUE: Multidetector CT imaging of the chest was performed during intravenous contrast administration. CONTRAST:  15mL OMNIPAQUE IOHEXOL 300 MG/ML  SOLN COMPARISON:  None. FINDINGS: Cardiovascular: Coronary artery calcifications. Normal heart size. No pericardial effusion. Mediastinum/Nodes: No enlarged mediastinal, hilar, or axillary lymph nodes. Thyroid gland, trachea, and esophagus demonstrate no significant findings. Lungs/Pleura: Bibasilar partial atelectasis. There are occasional small pulmonary nodules, for example a 3 mm nodule of the anterior right middle lobe (series 7, image 88). No pleural effusion or pneumothorax. Upper Abdomen: Gross biliary ductal dilatation and multiple cystic appearing lesions of the partially included pancreatic tail. Musculoskeletal: No chest wall mass or suspicious bone lesions identified. IMPRESSION: 1. There are occasional small pulmonary nodules, for example a 3 mm nodule of the anterior right middle lobe (series 7, image 88). These are nonspecific and although they likely reflect sequelae of prior infection or inflammation, early metastatic disease is not excluded. Attention on follow-up. 2.  Coronary artery disease. 3. Gross biliary ductal dilatation and multiple cystic appearing lesions of the partially  included pancreatic tail. Correlate with complete imaging of the abdomen. Electronically Signed   By: Eddie Candle M.D.   On: 12/20/2018 08:39   Ct Abdomen Pelvis W Contrast  Result Date: 12/20/2018 CLINICAL DATA:  Jaundice. EXAM: CT ABDOMEN AND PELVIS WITH CONTRAST TECHNIQUE: Multidetector CT imaging of the abdomen and pelvis was performed using the standard protocol following bolus administration of intravenous contrast. CONTRAST:  1100mL OMNIPAQUE IOHEXOL 300 MG/ML SOLN intravenously, 39mL OMNIPAQUE IOHEXOL 300 MG/ML SOLN orally COMPARISON:  CT scan of same day. FINDINGS: Lower chest: No acute abnormality. Hepatobiliary: No gallstones are noted. Severe intrahepatic and extrahepatic biliary dilatation is noted. No focal hepatic lesion is noted. Pancreas: Possible 3.5 x 2.5 cm solid mass seen in pancreatic head which appears to be causing above mentioned biliary dilatation. Ductal dilatation in the pancreas is also noted in the body and tail with multiple cystic lesions seen in  the body and tail. The largest measuring 3.1 cm. Spleen: Normal in size without focal abnormality. Adrenals/Urinary Tract: Adrenal glands appear normal. Right renal cyst is noted. No hydronephrosis or renal obstruction is noted. Urinary bladder is unremarkable. Stomach/Bowel: Stomach is within normal limits. Appendix appears normal. No evidence of bowel wall thickening, distention, or inflammatory changes. Vascular/Lymphatic: Aortic atherosclerosis. No enlarged abdominal or pelvic lymph nodes. Reproductive: Prostate is unremarkable. Other: No abdominal wall hernia or abnormality. No abdominopelvic ascites. Musculoskeletal: No acute or significant osseous findings. IMPRESSION: Severe intrahepatic and extrahepatic biliary dilatation is noted due to possible 3.5 x 2.5 cm solid mass in pancreatic head concerning for malignancy. Pancreatic ductal dilatation is noted as well with multiple cystic lesions noted in the pancreatic body and tail.  MRI of the pancreas is recommended when the patient can follow breathing instructions and hold still. Aortic Atherosclerosis (ICD10-I70.0). Electronically Signed   By: Marijo Conception, M.D.   On: 12/20/2018 19:56   Dg Ercp Biliary & Pancreatic Ducts  Result Date: 12/21/2018 CLINICAL DATA:  Jaundice EXAM: ERCP TECHNIQUE: Multiple spot images obtained with the fluoroscopic device and submitted for interpretation post-procedure. COMPARISON:  CT 12/20/2018 FINDINGS: Series of fluoroscopic spot images document endoscopic cannulation and opacification of the CBD and biliary tree. Subsequent images document deployment of a metallic biliary stent across the CBD obstruction. IMPRESSION: Endoscopic CBD cannulation and metallic biliary stent placement. These images were submitted for radiologic interpretation only. Please see the procedural report for the amount of contrast and the fluoroscopy time utilized. Electronically Signed   By: Lucrezia Europe M.D.   On: 12/21/2018 13:42    Assessment and Plan:   1. Pancreas head mass  Presented with jaundice, bilirubin elevated at 21.4  CTs abdomen/pelvis 12/20/2018-severe intrahepatic and extrahepatic biliary dilatation due to a possible 3.5 x 2.5 cm solid mass in the pancreatic head.  Pancreatic ductal dilatation noted as well with multiple cystic lesions in the pancreatic tail and body.    CT chest 12/20/2018-occasional nonspecific small pulmonary nodules felt to likely be related to prior infection or inflammation.    ERCP by Dr. Watt Climes 12/21/2018.  The major papilla was on the rim of a diverticulum.  The minor papilla appeared to be bulging.  A biopsy of the duodenum adjacent to the minor papilla was performed.  Plastic stent was placed into the ventral pancreatic duct.  Biliary sphincterotomy performed.  Covered metal stent placed into the common bile duct.  Bile duct brushings showed benign reactive/reparative changes.  Biopsy of polypoid duodenal mucosa suggestive of  nodular peptic duodenitis, negative for dysplasia or malignancy.   Upper EUS by Dr. Paulita Fujita on 01/03/2019 showed a few cystic lesions in the pancreatic body and pancreatic tail.  A stent was visualized in the common bile duct.  Mass identified in the pancreatic head with fine-needle aspiration performed.  Lesion appeared to abut or potentially superficially invade the superior mesenteric vein.  No lymphadenopathy.  No involvement of the superior mesenteric artery or celiac artery.  Pathology returned suspicious for malignancy.  Dr. Paulita Fujita notes if pathology shows adenocarcinoma the lesion would be staged T3 N0 MX. 2. Biliary obstruction status post stent placement 3. History of left lower extremity DVT 2017, 2018-on Eliquis. 4. Type 2 diabetes 5. Hypertension 6. Chronic kidney disease 7. Tobacco use 8. Anemia, macrocytic.  Transfused 2 units of blood 12/20/2018  Brian Torres is a 58 year old man who recently presented with obstructive jaundice.  He was found to have a pancreatic head mass.  He understands the recent pancreas biopsy was suspicious for malignancy but not definitely diagnostic.  Dr. Benay Spice reviewed with him and his wife the likely diagnosis of pancreas cancer which appears to be borderline resectable.  We will contact pathology for review of the biopsy.  If a definitive diagnosis cannot be made with the current specimen the plan is to contact Dr. Paulita Fujita regarding another biopsy.  He has been referred to Dr. Barry Dienes to discuss surgery.  We had a preliminary discussion today regarding neoadjuvant chemotherapy.  He will return for lab and follow-up on 01/19/2019.  Patient seen with Dr. Benay Spice.  60 minutes were spent face-to-face at today's visit with the majority of that time involved in counseling/coordination of care.    [name], NP 01/11/2019, 8:33 AM  This was a shared visit with [name].  Brian Torres was interviewed and examined.  He presented with obstructive jaundice.  He  appears to have pancreas cancer, localized to the head of the pancreas.  The pathology from the initial ERCP procedure and an EUS are nondiagnostic.  I discussed the probable diagnosis with Brian Torres and his wife.  He appears to have borderline resectable disease.  I discussed the case with Dr. Barry Dienes.  We will recommend neoadjuvant FOLFIRINOX if a diagnosis of pancreas cancer is confirmed.  Dr. Barry Dienes will see Brian Torres and arrange for Port-A-Cath placement.  I will discuss the case with pathology to be sure diagnostic tissue is not available.  If not, we will consult with Dr. Paulita Fujita to consider a repeat EUS biopsy.  He has a history of anemia, the etiology is unclear.  We will investigate the anemia further when he returns next week.  Julieanne Manson, MD

## 2019-01-11 NOTE — Telephone Encounter (Signed)
Scheduled appt per 3/11 los. ° °Printed calendar and avs. °

## 2019-01-11 NOTE — Telephone Encounter (Signed)
Opened in error

## 2019-01-12 ENCOUNTER — Other Ambulatory Visit: Payer: Self-pay | Admitting: Nurse Practitioner

## 2019-01-12 DIAGNOSIS — K8689 Other specified diseases of pancreas: Secondary | ICD-10-CM

## 2019-01-16 ENCOUNTER — Telehealth: Payer: Self-pay

## 2019-01-16 NOTE — Telephone Encounter (Signed)
Called patient to relay information from Dr. Benay Spice. Dr. Paulita Fujita is asking for pathology second opinion. Once we have definitive diagnosis/path we will bring him in for new patient appointment. He will call me 3:18 to let me know if he has received information from [facility].

## 2019-01-16 NOTE — Progress Notes (Signed)
Called patient to introduce myself and to explain role of navigator.  Patient has appointment to see Dr. Barry Dienes on 01/27/19. I will meet with patient at next f/u visit. No barriers to tx identified.

## 2019-01-19 ENCOUNTER — Inpatient Hospital Stay: Payer: Managed Care, Other (non HMO)

## 2019-01-19 ENCOUNTER — Inpatient Hospital Stay: Payer: Managed Care, Other (non HMO) | Admitting: Nurse Practitioner

## 2019-01-20 ENCOUNTER — Telehealth: Payer: Self-pay

## 2019-01-20 ENCOUNTER — Other Ambulatory Visit: Payer: Self-pay | Admitting: Gastroenterology

## 2019-01-20 NOTE — Telephone Encounter (Signed)
Per Dr. Benay Spice I called patient to let him know that Dr. Benay Spice had spoken with Dr. Paulita Fujita and that Dr. Paulita Fujita would be speaking to one of his partners about proceeding to obtain more tissue to get definitive diagnosis. Patient voiced frustration at having to wait for next steps. I validated his feelings and encouraged him to call and speak with Dr. Paulita Fujita at [facility]. I will continue to update patient as I have information. Patient has my direct phone line.

## 2019-01-24 ENCOUNTER — Ambulatory Visit (HOSPITAL_COMMUNITY): Payer: Managed Care, Other (non HMO) | Admitting: Certified Registered"

## 2019-01-24 ENCOUNTER — Ambulatory Visit (HOSPITAL_COMMUNITY): Payer: Managed Care, Other (non HMO)

## 2019-01-24 ENCOUNTER — Ambulatory Visit (HOSPITAL_COMMUNITY)
Admission: RE | Admit: 2019-01-24 | Discharge: 2019-01-24 | Disposition: A | Discharge status: Home / Self Care | Payer: Managed Care, Other (non HMO) | Attending: Gastroenterology | Admitting: Gastroenterology

## 2019-01-24 ENCOUNTER — Encounter (HOSPITAL_COMMUNITY): Payer: Self-pay | Admitting: *Deleted

## 2019-01-24 ENCOUNTER — Other Ambulatory Visit: Payer: Self-pay

## 2019-01-24 ENCOUNTER — Encounter (HOSPITAL_COMMUNITY)
Admission: RE | Disposition: A | Discharge status: Home / Self Care | Payer: Self-pay | Source: Home / Self Care | Attending: Gastroenterology

## 2019-01-24 DIAGNOSIS — K869 Disease of pancreas, unspecified: Secondary | ICD-10-CM | POA: Diagnosis present

## 2019-01-24 DIAGNOSIS — Z86718 Personal history of other venous thrombosis and embolism: Secondary | ICD-10-CM | POA: Insufficient documentation

## 2019-01-24 DIAGNOSIS — F17200 Nicotine dependence, unspecified, uncomplicated: Secondary | ICD-10-CM | POA: Insufficient documentation

## 2019-01-24 DIAGNOSIS — E119 Type 2 diabetes mellitus without complications: Secondary | ICD-10-CM | POA: Diagnosis not present

## 2019-01-24 DIAGNOSIS — C250 Malignant neoplasm of head of pancreas: Secondary | ICD-10-CM | POA: Insufficient documentation

## 2019-01-24 DIAGNOSIS — Z79899 Other long term (current) drug therapy: Secondary | ICD-10-CM | POA: Insufficient documentation

## 2019-01-24 DIAGNOSIS — Z7901 Long term (current) use of anticoagulants: Secondary | ICD-10-CM | POA: Insufficient documentation

## 2019-01-24 DIAGNOSIS — I10 Essential (primary) hypertension: Secondary | ICD-10-CM | POA: Diagnosis not present

## 2019-01-24 DIAGNOSIS — K831 Obstruction of bile duct: Secondary | ICD-10-CM | POA: Diagnosis not present

## 2019-01-24 DIAGNOSIS — Z794 Long term (current) use of insulin: Secondary | ICD-10-CM | POA: Diagnosis not present

## 2019-01-24 HISTORY — PX: EUS: SHX5427

## 2019-01-24 HISTORY — PX: ESOPHAGOGASTRODUODENOSCOPY (EGD) WITH PROPOFOL: SHX5813

## 2019-01-24 HISTORY — PX: BILIARY STENT PLACEMENT: SHX5538

## 2019-01-24 HISTORY — PX: ENDOSCOPIC RETROGRADE CHOLANGIOPANCREATOGRAPHY (ERCP) WITH PROPOFOL: SHX5810

## 2019-01-24 HISTORY — PX: STENT REMOVAL: SHX6421

## 2019-01-24 HISTORY — PX: FINE NEEDLE ASPIRATION: SHX5430

## 2019-01-24 LAB — GLUCOSE, CAPILLARY: Glucose-Capillary: 197 mg/dL — ABNORMAL HIGH (ref 70–99)

## 2019-01-24 SURGERY — ERCP, WITH INTERVENTION IF INDICATED
Anesthesia: General

## 2019-01-24 SURGERY — ESOPHAGOGASTRODUODENOSCOPY (EGD) WITH PROPOFOL
Anesthesia: General

## 2019-01-24 MED ORDER — FENTANYL CITRATE (PF) 100 MCG/2ML IJ SOLN
INTRAMUSCULAR | Status: AC
  Filled 2019-01-24: qty 2

## 2019-01-24 MED ORDER — INDOMETHACIN 50 MG RE SUPP
RECTAL | Status: DC | PRN
  Administered 2019-01-24: 100 mg via RECTAL

## 2019-01-24 MED ORDER — INDOMETHACIN 50 MG RE SUPP
RECTAL | Status: AC
  Filled 2019-01-24: qty 2

## 2019-01-24 MED ORDER — PROPOFOL 10 MG/ML IV BOLUS
INTRAVENOUS | Status: DC | PRN
  Administered 2019-01-24: 200 mg via INTRAVENOUS

## 2019-01-24 MED ORDER — CIPROFLOXACIN IN D5W 400 MG/200ML IV SOLN
INTRAVENOUS | Status: AC
  Filled 2019-01-24: qty 200

## 2019-01-24 MED ORDER — SUGAMMADEX SODIUM 500 MG/5ML IV SOLN
INTRAVENOUS | Status: DC | PRN
  Administered 2019-01-24: 180 mg via INTRAVENOUS

## 2019-01-24 MED ORDER — PHENYLEPHRINE 40 MCG/ML (10ML) SYRINGE FOR IV PUSH (FOR BLOOD PRESSURE SUPPORT)
PREFILLED_SYRINGE | INTRAVENOUS | Status: DC | PRN
  Administered 2019-01-24: 120 ug via INTRAVENOUS

## 2019-01-24 MED ORDER — ONDANSETRON HCL 4 MG/2ML IJ SOLN
INTRAMUSCULAR | Status: DC | PRN
  Administered 2019-01-24: 4 mg via INTRAVENOUS

## 2019-01-24 MED ORDER — PROMETHAZINE HCL 25 MG/ML IJ SOLN: 6.2500 mg | INTRAMUSCULAR | Status: DC | PRN

## 2019-01-24 MED ORDER — DEXAMETHASONE SODIUM PHOSPHATE 10 MG/ML IJ SOLN
INTRAMUSCULAR | Status: DC | PRN
  Administered 2019-01-24: 10 mg via INTRAVENOUS

## 2019-01-24 MED ORDER — CIPROFLOXACIN IN D5W 400 MG/200ML IV SOLN
INTRAVENOUS | Status: DC | PRN
  Administered 2019-01-24: 400 mg via INTRAVENOUS

## 2019-01-24 MED ORDER — SODIUM CHLORIDE 0.9 % IV SOLN: INTRAVENOUS | Status: DC

## 2019-01-24 MED ORDER — LIDOCAINE 2% (20 MG/ML) 5 ML SYRINGE
INTRAMUSCULAR | Status: DC | PRN
  Administered 2019-01-24: 100 mg via INTRAVENOUS

## 2019-01-24 MED ORDER — MIDAZOLAM HCL 2 MG/2ML IJ SOLN: 0.5000 mg | Freq: Once | INTRAMUSCULAR | Status: DC | PRN

## 2019-01-24 MED ORDER — MIDAZOLAM HCL 2 MG/2ML IJ SOLN
INTRAMUSCULAR | Status: AC
  Filled 2019-01-24: qty 2

## 2019-01-24 MED ORDER — HYDROMORPHONE HCL 1 MG/ML IJ SOLN: 0.2500 mg | INTRAMUSCULAR | Status: DC | PRN

## 2019-01-24 MED ORDER — GLUCAGON HCL RDNA (DIAGNOSTIC) 1 MG IJ SOLR
INTRAMUSCULAR | Status: AC
  Filled 2019-01-24: qty 1

## 2019-01-24 MED ORDER — ROCURONIUM BROMIDE 10 MG/ML (PF) SYRINGE
PREFILLED_SYRINGE | INTRAVENOUS | Status: DC | PRN
  Administered 2019-01-24: 50 mg via INTRAVENOUS

## 2019-01-24 MED ORDER — LACTATED RINGERS IV SOLN
INTRAVENOUS | Status: DC
  Administered 2019-01-24 (×2): via INTRAVENOUS

## 2019-01-24 MED ORDER — MEPERIDINE HCL 25 MG/ML IJ SOLN: 6.2500 mg | INTRAMUSCULAR | Status: DC | PRN

## 2019-01-24 MED ORDER — MIDAZOLAM HCL 5 MG/5ML IJ SOLN
INTRAMUSCULAR | Status: DC | PRN
  Administered 2019-01-24: 2 mg via INTRAVENOUS

## 2019-01-24 MED ORDER — EPHEDRINE SULFATE-NACL 50-0.9 MG/10ML-% IV SOSY
PREFILLED_SYRINGE | INTRAVENOUS | Status: DC | PRN
  Administered 2019-01-24: 10 mg via INTRAVENOUS

## 2019-01-24 MED ORDER — SODIUM CHLORIDE 0.9 % IV SOLN
INTRAVENOUS | Status: DC | PRN
  Administered 2019-01-24: 20 mL

## 2019-01-24 MED ORDER — PROPOFOL 10 MG/ML IV BOLUS
INTRAVENOUS | Status: AC
  Filled 2019-01-24: qty 20

## 2019-01-24 MED ORDER — FENTANYL CITRATE (PF) 100 MCG/2ML IJ SOLN
INTRAMUSCULAR | Status: DC | PRN
  Administered 2019-01-24 (×2): 50 ug via INTRAVENOUS

## 2019-01-24 SURGICAL SUPPLY — 15 items

## 2019-01-24 NOTE — Transfer of Care (Signed)
Immediate Anesthesia Transfer of Care Note  Patient: Brian Torres  Procedure(s) Performed: ESOPHAGOGASTRODUODENOSCOPY (EGD) WITH PROPOFOL (N/A ) FULL UPPER ENDOSCOPIC ULTRASOUND (EUS) RADIAL (N/A ) FINE NEEDLE ASPIRATION (FNA) LINEAR (N/A ) ENDOSCOPIC RETROGRADE CHOLANGIOPANCREATOGRAPHY (ERCP) WITH PROPOFOL (N/A )  Patient Location: PACU  Anesthesia Type:General  Level of Consciousness: awake, alert  and oriented  Airway & Oxygen Therapy: Patient Spontanous Breathing and Patient connected to face mask oxygen  Post-op Assessment: Report given to RN and Post -op Vital signs reviewed and stable  Post vital signs: Reviewed and stable  Last Vitals:  Vitals Value Taken Time  BP 137/77 01/24/2019  1:47 PM  Temp    Pulse 72 01/24/2019  1:48 PM  Resp 23 01/24/2019  1:48 PM  SpO2 100 % 01/24/2019  1:48 PM  Vitals shown include unvalidated device data.  Last Pain:  Vitals:   01/24/19 1000  TempSrc: Oral  PainSc: 0-No pain         Complications: No apparent anesthesia complications

## 2019-01-24 NOTE — H&P (Signed)
Patient interval history reviewed.  Patient examined again.  There has been no change from documented H/P dated (scanned into chart from our office) except as documented above.  Assessment:  1.  Obstructive jaundice with metal (covered) bile duct stent. 2.  Suspected mass head of pancreas.  Prior biopsies (FNA) suspicious, but not diagnostic, of malignancy.  Plan:  1.  Upper endoscopy with metal stent removal then endoscopy with ultrasound and possible fine needle aspiration without stent in place. 2.  Risks (bleeding, infection, bowel perforation that could require surgery, sedation-related changes in cardiopulmonary systems), benefits (identification and possible treatment of source of symptoms, exclusion of certain causes of symptoms), and alternatives (watchful waiting, radiographic imaging studies, empiric medical treatment) of upper endoscopy with foreign body removal, with ultrasound and possible fine needle aspiration (EGD +FBR, EUS +/- FNA) were explained to patient/family in detail and patient wishes to proceed. 3.  After EUS finished, will repeat ERCP for biliary stent replacement. 4.  Risks (up to and including bleeding, infection, perforation, pancreatitis that can be complicated by infected necrosis and death), benefits (removal of stones, alleviating blockage, decreasing risk of cholangitis or choledocholithiasis-related pancreatitis), and alternatives (watchful waiting, percutaneous transhepatic cholangiography) of ERCP were explained to patient/family in detail and patient elects to proceed.

## 2019-01-24 NOTE — Anesthesia Preprocedure Evaluation (Addendum)
Anesthesia Evaluation  Patient identified by MRN, date of birth, ID band Patient awake    Reviewed: Allergy & Precautions, NPO status , Patient's Chart, lab work & pertinent test results  History of Anesthesia Complications Negative for: history of anesthetic complications  Airway Mallampati: II  TM Distance: >3 FB Neck ROM: Full    Dental  (+) Missing, Dental Advisory Given   Pulmonary Current Smoker,    breath sounds clear to auscultation       Cardiovascular hypertension, Pt. on medications (-) angina+ DVT   Rhythm:Regular Rate:Normal     Neuro/Psych negative neurological ROS     GI/Hepatic GERD  Controlled,Pancreatic mass   Endo/Other  diabetes (glu 197), Oral Hypoglycemic Agents, Insulin Dependent  Renal/GU negative Renal ROS     Musculoskeletal   Abdominal   Peds  Hematology Eliquis: last dose sat night   Anesthesia Other Findings   Reproductive/Obstetrics                            Anesthesia Physical Anesthesia Plan  ASA: III  Anesthesia Plan: General   Post-op Pain Management:    Induction: Intravenous and Rapid sequence  PONV Risk Score and Plan: 1 and Treatment may vary due to age or medical condition, Ondansetron and Dexamethasone  Airway Management Planned: Oral ETT  Additional Equipment:   Intra-op Plan:   Post-operative Plan: Extubation in OR  Informed Consent: I have reviewed the patients History and Physical, chart, labs and discussed the procedure including the risks, benefits and alternatives for the proposed anesthesia with the patient or authorized representative who has indicated his/her understanding and acceptance.     Dental advisory given  Plan Discussed with: CRNA and Surgeon  Anesthesia Plan Comments:        Anesthesia Quick Evaluation

## 2019-01-24 NOTE — Anesthesia Postprocedure Evaluation (Signed)
Anesthesia Post Note  Patient: Brian Torres  Procedure(s) Performed: ESOPHAGOGASTRODUODENOSCOPY (EGD) WITH PROPOFOL (N/A ) FULL UPPER ENDOSCOPIC ULTRASOUND (EUS) RADIAL (N/A ) FINE NEEDLE ASPIRATION (FNA) LINEAR (N/A ) ENDOSCOPIC RETROGRADE CHOLANGIOPANCREATOGRAPHY (ERCP) WITH PROPOFOL (N/A ) BILIARY STENT PLACEMENT (N/A )     Patient location during evaluation: Endoscopy Anesthesia Type: General Level of consciousness: awake and alert, oriented and patient cooperative Pain management: pain level controlled Vital Signs Assessment: post-procedure vital signs reviewed and stable Respiratory status: spontaneous breathing, nonlabored ventilation and respiratory function stable Cardiovascular status: blood pressure returned to baseline and stable Postop Assessment: no apparent nausea or vomiting Anesthetic complications: no    Last Vitals:  Vitals:   01/24/19 1400 01/24/19 1410  BP: 129/75 132/87  Pulse: 69 64  Resp: (!) 23 19  Temp:    SpO2: 100% 100%    Last Pain:  Vitals:   01/24/19 1410  TempSrc:   PainSc: 0-No pain                 Devonn Giampietro,E. Samson Ralph

## 2019-01-24 NOTE — Op Note (Signed)
[facility] Patient Name: Brian Torres Procedure Date: 01/24/2019 MRN: [id number] Attending MD: Arta Silence , MD Date of Birth: 07-03-1961 CSN: [id number] Age: 58 Admit Type: Outpatient Procedure:                Upper EUS Indications:              Suspected mass in pancreas on CT scan, Elevated                            liver enzymes, obstructive jaundice Providers:                Arta Silence, MD, Vista Lawman, RN, Ziza Hastings Dalton, Technician Referring MD:             Dr. Benay Spice (Oncology) Medicines:                General Anesthesia, Cipro 789 mg IV Complications:            No immediate complications. Estimated Blood Loss:     Estimated blood loss: none. Procedure:                Pre-Anesthesia Assessment:                           - Prior to the procedure, a History and Physical                            was performed, and patient medications and                            allergies were reviewed. The patient's tolerance of                            previous anesthesia was also reviewed. The risks                            and benefits of the procedure and the sedation                            options and risks were discussed with the patient.                            All questions were answered, and informed consent                            was obtained. Prior Anticoagulants: The patient has                            taken Eliquis (apixaban), last dose was 3 days                            prior to procedure. ASA Grade Assessment: III - A  patient with severe systemic disease. After                            reviewing the risks and benefits, the patient was                            deemed in satisfactory condition to undergo the                            procedure.                           After obtaining informed consent, the endoscope was                            passed under direct  vision. Throughout the                            procedure, the patient's blood pressure, pulse, and                            oxygen saturations were monitored continuously. The                            [id number] ([id number]) Olympus Linear EUS was                            introduced through the mouth, and advanced to the                            second part of duodenum. The upper EUS was                            accomplished without difficulty. The patient                            tolerated the procedure well. Scope In: Scope Out: Findings:      ENDOSONOGRAPHIC FINDING: :      One stent was visualized endosonographically in the common bile duct.       Extension of the stent was noted in the common bile duct. Bile duct       stent removed with cold forceps and sent for pathology.      An oval mass was identified in the pancreatic head. The mass was       hypoechoic. The mass measured 25 mm by 25 mm in maximal cross-sectional       diameter. The endosonographic borders were poorly-defined. There was       sonographic evidence suggesting invasion into the superior mesenteric       vein (manifested by abutment). The remainder of the pancreas was       examined. The endosonographic appearance of parenchyma and the upstream       pancreatic duct indicated duct dilation. Fine needle aspiration for       cytology was performed. Color Doppler imaging was utilized prior to       needle puncture to confirm  a lack of significant vascular structures       within the needle path. Three passes were made with the 25 gauge needle       using a transduodenal approach. A stylet was used. A cytotechnologist       was present to evaluate the adequacy of the specimen. The cellularity of       the specimen was adequate. Final cytology results are pending.      There was dilation in the common bile duct which measured up to 10 mm.      The pancreatic duct had a dilated endosonographic appearance in  the       entire pancreas.      No lymphadenopathy seen. Impression:               - One stent was visualized endosonographically in                            the common bile duct.                           - A mass was identified in the pancreatic head.                            This was staged T3 N0 Mx by endosonographic                            criteria. Fine needle aspiration performed.                           - There was dilation in the common bile duct which                            measured up to 10 mm.                           - The pancreatic duct had a dilated endosonographic                            appearance in the entire pancreas. Moderate Sedation:      None Recommendation:           - Await cytology results.                           - Perform an ERCP today. Procedure Code(s):        --- Professional ---                           [id number], Esophagogastroduodenoscopy, flexible,                            transoral; with transendoscopic ultrasound-guided                            intramural or transmural fine needle                            aspiration/biopsy(s) (  includes endoscopic                            ultrasound examination of the esophagus, stomach,                            and either the duodenum or a surgically altered                            stomach where the jejunum is examined distal to the                            anastomosis) Diagnosis Code(s):        --- Professional ---                           K86.89, Other specified diseases of pancreas                           R93.3, Abnormal findings on diagnostic imaging of                            other parts of digestive tract                           R74.8, Abnormal levels of other serum enzymes                           K83.8, Other specified diseases of biliary tract CPT copyright 2018 American Medical Association. All rights reserved. The codes documented in this report are preliminary  and upon coder review may  be revised to meet current compliance requirements. Arta Silence, MD 01/24/2019 1:14:41 PM This report has been signed electronically. Number of Addenda: 0

## 2019-01-24 NOTE — Discharge Instructions (Addendum)
YOU HAD AN ENDOSCOPIC PROCEDURE TODAY: Refer to the procedure report and other information in the discharge instructions given to you for any specific questions about what was found during the examination. If this information does not answer your questions, please call [facility] office at [phone] to clarify.   YOU SHOULD EXPECT: Some feelings of bloating in the abdomen. Passage of more gas than usual. Walking can help get rid of the air that was put into your GI tract during the procedure and reduce the bloating. If you had a lower endoscopy (such as a colonoscopy or flexible sigmoidoscopy) you may notice spotting of blood in your stool or on the toilet paper. Some abdominal soreness may be present for a day or two, also.  DIET: Your first meal following the procedure should be a light meal and then it is ok to progress to your normal diet. A half-sandwich or bowl of soup is an example of a good first meal. Heavy or fried foods are harder to digest and may make you feel nauseous or bloated. Drink plenty of fluids but you should avoid alcoholic beverages for 24 hours. If you had a esophageal dilation, please see attached instructions for diet.   ACTIVITY: Your care partner should take you home directly after the procedure. You should plan to take it easy, moving slowly for the rest of the day. You can resume normal activity the day after the procedure however YOU SHOULD NOT DRIVE, use power tools, machinery or perform tasks that involve climbing or major physical exertion for 24 hours (because of the sedation medicines used during the test).   SYMPTOMS TO REPORT IMMEDIATELY: A gastroenterologist can be reached at any hour. Please call [phone]  for any of the following symptoms:    Following upper endoscopy (EGD, EUS, ERCP, esophageal dilation) Vomiting of blood or coffee ground material  New, significant abdominal pain  New, significant chest pain or pain under the shoulder blades  Painful or  persistently difficult swallowing  New shortness of breath  Black, tarry-looking or red, bloody stools  FOLLOW UP:  If any biopsies were taken you will be contacted by phone or by letter within the next 1-3 weeks. Call [phone]  if you have not heard about the biopsies in 3 weeks.  Please also call with any specific questions about appointments or follow up tests. Call if question or problem otherwise clear liquid diet until 7 or 8 PM and if doing well may have soft solids and resume your Eliquis either tomorrow night or Thursday morning and call for biopsy report in 3 to 4 days if you have not heard from [place]

## 2019-01-24 NOTE — Anesthesia Procedure Notes (Signed)
Procedure Name: Intubation Date/Time: 01/24/2019 11:51 AM Performed by: Dodd Schmid D, CRNA Pre-anesthesia Checklist: Patient identified, Emergency Drugs available, Suction available and Patient being monitored Patient Re-evaluated:Patient Re-evaluated prior to induction Oxygen Delivery Method: Circle system utilized Preoxygenation: Pre-oxygenation with 100% oxygen Induction Type: IV induction Ventilation: Mask ventilation without difficulty Laryngoscope Size: Mac and 4 Grade View: Grade I Tube type: Oral Tube size: 7.5 mm Number of attempts: 1 Airway Equipment and Method: Stylet Placement Confirmation: ETT inserted through vocal cords under direct vision,  positive ETCO2 and breath sounds checked- equal and bilateral Secured at: 24 cm Tube secured with: Tape Dental Injury: Teeth and Oropharynx as per pre-operative assessment

## 2019-01-24 NOTE — Op Note (Signed)
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 Estimated Blood Loss:     Estimated blood loss: none. Procedure:                Pre-Anesthesia Assessment:                           - Prior to the procedure, a History and Physical                            was performed, and patient medications and                            allergies were reviewed. The patient's tolerance of                            previous anesthesia was also reviewed. The risks                            and benefits of the procedure and the sedation                            options and risks were discussed with the patient.                            All questions were answered, and informed consent                            was obtained. Prior Anticoagulants: The patient has                            taken Eliquis (apixaban), last dose was 3 days                            prior to procedure. ASA Grade Assessment: III - A                            patient with  severe systemic disease. After                            reviewing the risks and benefits, the patient was                            deemed in satisfactory condition to undergo the                            procedure.                           - Prior to the procedure, a History and Physical                            was performed, and patient medications and                            allergies were reviewed. The  patient's tolerance of                            previous anesthesia was also reviewed. The risks                            and benefits of the procedure and the sedation                            options and risks were discussed with the patient.                            All questions were answered, and informed consent                            was obtained. Prior Anticoagulants: The patient has                            taken Eliquis (apixaban), last dose was 3 days                            prior to procedure. ASA Grade Assessment: II - A                            patient with mild systemic disease. After reviewing                            the risks and benefits, the patient was deemed in                            satisfactory condition to undergo the procedure.                           After obtaining informed consent, the scope was  passed under direct vision. Throughout the                            procedure, the patient's blood pressure, pulse, and                            oxygen saturations were monitored continuously. The                            [id number]-Q180V ([id number]) Olympus duodenoscope was                            introduced through the mouth, and used to inject                            contrast into and used to locate the major papilla.                            The ERCP was accomplished without difficulty. The                            patient tolerated the procedure well. We proceeded                            with the ERCP after metal CBD stent was removed and                            EUS done by Dr. Paulita Fujita Scope In: Scope Out: Findings:      When we brought the ampullary area into view we saw the pancreatic stent       which was almost completely out of the duct and the plastic stent was       removed from the pancreatic duct using a Raptor grasping device and sent       for cytology. Deep selective cannulation was  obtained using the       sphincterotome loaded with the JAG Jagwire on the first attempt and the       sphincterotome was exchanged with the balloon and the biliary tree was       swept with an adjustable 9- 12 mm balloon starting at the bifurcation.       Sludge was swept from the duct. We proceeded with a few more balloon       pull-through's and nothing was found. One 10 Fr by 6 cm covered metal       stent was placed 5.5 cm into the common bile duct. Bile flowed through       the stent. The stent was in good position. There were no pancreatic       injection or wire advancement throughout the procedure and there was       adequate biliary drainage at the end of the procedure Impression:               - One stent was removed from the pancreatic duct.                           - The  biliary tree was swept and sludge was removed                            and nothing was found on subsequent balloon                            pull-through's.                           - One covered metal stent was placed into the                            common bile duct. Moderate Sedation:      Not Applicable - Patient had care per Anesthesia. Recommendation:           - Clear liquid diet for 6 hours. May have soft                            solids later if doing well                           - Resume Eliquis (apixaban) at prior dose tomorrow                            p.m. if doing well otherwise can wait for Thursday                            to begin.                           - Await cytology results and await path results.                           - Return to GI clinic PRN.                           - Telephone GI clinic for pathology results in 4                            days.                           - Telephone GI clinic if symptomatic PRN. Procedure Code(s):        --- Professional ---                           [phone], Esophagogastroduodenoscopy, flexible,                             transoral; with removal of foreign body(s) Diagnosis Code(s):        --- Professional ---                           Z46.59, Encounter for fitting and adjustment of  other gastrointestinal appliance and device                           C25.0, Malignant neoplasm of head of pancreas CPT copyright 2018 American Medical Association. All rights reserved. The codes documented in this report are preliminary and upon coder review may  be revised to meet current compliance requirements. Clarene Essex, MD 01/24/2019 1:43:29 PM This report has been signed electronically. Number of Addenda: 0

## 2019-01-26 ENCOUNTER — Inpatient Hospital Stay (HOSPITAL_BASED_OUTPATIENT_CLINIC_OR_DEPARTMENT_OTHER): Payer: Managed Care, Other (non HMO) | Admitting: Oncology

## 2019-01-26 ENCOUNTER — Telehealth: Payer: Self-pay

## 2019-01-26 ENCOUNTER — Telehealth: Payer: Self-pay | Admitting: Oncology

## 2019-01-26 ENCOUNTER — Other Ambulatory Visit: Payer: Self-pay

## 2019-01-26 ENCOUNTER — Telehealth: Payer: Self-pay | Admitting: *Deleted

## 2019-01-26 DIAGNOSIS — D649 Anemia, unspecified: Secondary | ICD-10-CM | POA: Diagnosis not present

## 2019-01-26 DIAGNOSIS — I129 Hypertensive chronic kidney disease with stage 1 through stage 4 chronic kidney disease, or unspecified chronic kidney disease: Secondary | ICD-10-CM

## 2019-01-26 DIAGNOSIS — E1122 Type 2 diabetes mellitus with diabetic chronic kidney disease: Secondary | ICD-10-CM

## 2019-01-26 DIAGNOSIS — N189 Chronic kidney disease, unspecified: Secondary | ICD-10-CM

## 2019-01-26 DIAGNOSIS — Z7901 Long term (current) use of anticoagulants: Secondary | ICD-10-CM | POA: Diagnosis not present

## 2019-01-26 DIAGNOSIS — Z86718 Personal history of other venous thrombosis and embolism: Secondary | ICD-10-CM

## 2019-01-26 DIAGNOSIS — C250 Malignant neoplasm of head of pancreas: Secondary | ICD-10-CM | POA: Diagnosis present

## 2019-01-26 MED ORDER — ONDANSETRON HCL 8 MG PO TABS: 8.0000 mg | ORAL_TABLET | Freq: Three times a day (TID) | ORAL | 0 refills | Status: DC | PRN

## 2019-01-26 MED ORDER — LIDOCAINE-PRILOCAINE 2.5-2.5 % EX CREA: 1.0000 "application " | TOPICAL_CREAM | CUTANEOUS | 1 refills | Status: DC | PRN

## 2019-01-26 MED ORDER — PROCHLORPERAZINE MALEATE 10 MG PO TABS: 10.0000 mg | ORAL_TABLET | Freq: Four times a day (QID) | ORAL | 1 refills | Status: DC | PRN

## 2019-01-26 NOTE — Patient Instructions (Signed)
Coronavirus (COVID-19) Are you at risk?  Are you at risk for the Coronavirus (COVID-19)?  To be considered HIGH RISK for Coronavirus (COVID-19), you have to meet the following criteria:  . Traveled to [place], [place], [place], [place] or [place]; or in the [place] to [place], [place], [place], or [place]; and have fever, cough, and shortness of breath within the last 2 weeks of travel OR . Been in close contact with a person diagnosed with COVID-19 within the last 2 weeks and have fever, cough, and shortness of breath . IF YOU DO NOT MEET THESE CRITERIA, YOU ARE CONSIDERED LOW RISK FOR COVID-19.  What to do if you are HIGH RISK for COVID-19?  . If you are having a medical emergency, call 911. . Seek medical care right away. Before you go to a doctor's office, urgent care or emergency department, call ahead and tell them about your recent travel, contact with someone diagnosed with COVID-19, and your symptoms. You should receive instructions from your physician's office regarding next steps of care.  . When you arrive at healthcare provider, tell the healthcare staff immediately you have returned from visiting [place], [place], [place], [place] or [place]; or traveled in the [place] to [place], [place], [place], or [place]; in the last two weeks or you have been in close contact with a person diagnosed with COVID-19 in the last 2 weeks.   . Tell the health care staff about your symptoms: fever, cough and shortness of breath. . After you have been seen by a medical provider, you will be either: o Tested for (COVID-19) and discharged home on quarantine except to seek medical care if symptoms worsen, and asked to  - Stay home and avoid contact with others until you get your results (4-5 days)  - Avoid travel on public transportation if possible (such as bus, train, or airplane) or o Sent to the Emergency Department by EMS for evaluation, COVID-19 testing, and possible  admission depending on your condition and test results.  What to do if you are LOW RISK for COVID-19?  Reduce your risk of any infection by using the same precautions used for avoiding the common cold or flu:  . Wash your hands often with soap and warm water for at least 20 seconds.  If soap and water are not readily available, use an alcohol-based hand sanitizer with at least 60% alcohol.  . If coughing or sneezing, cover your mouth and nose by coughing or sneezing into the elbow areas of your shirt or coat, into a tissue or into your sleeve (not your hands). . Avoid shaking hands with others and consider head nods or verbal greetings only. . Avoid touching your eyes, nose, or mouth with unwashed hands.  . Avoid close contact with people who are sick. . Avoid places or events with large numbers of people in one location, like concerts or sporting events. . Carefully consider travel plans you have or are making. . If you are planning any travel outside or inside the [place], visit the CDC's Travelers' Health webpage for the latest health notices. . If you have some symptoms but not all symptoms, continue to monitor at home and seek medical attention if your symptoms worsen. . If you are having a medical emergency, call 911.   ADDITIONAL HEALTHCARE OPTIONS FOR PATIENTS  [facility] / e-Visit: [email or web address]         [facility]: [phone]  [facility]: [phone]                   [facility]: [phone]   

## 2019-01-26 NOTE — Progress Notes (Signed)
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 AJCC N Category: Staged < 8th Ed. AJCC M Category: Staged < 8th Ed. AJCC 8 Stage Grouping: Staged < 8th Ed. ECOG Performance Status: 0 Intent of Therapy: Curative Intent, Discussed with Patient

## 2019-01-26 NOTE — Telephone Encounter (Signed)
Called patient to touch base. He had just received call from Dr. Paulita Fujita with pathology results. Support offered. Advised of no visitors at [facility]. He plans to have wife on speaker phone. Patient encouraged to reach out for support. Will continue to follow as needed.

## 2019-01-26 NOTE — Telephone Encounter (Signed)
Scheduled appt per 3/26 los. ° °Printed calendar and avs. °

## 2019-01-26 NOTE — Progress Notes (Addendum)
Brian Torres OFFICE PROGRESS NOTE   Diagnosis: Pancreas cancer  INTERVAL HISTORY:   Brian Torres returns as scheduled.  He feels well.  No pain or nausea.  He underwent a repeat ERCP on 01/23/2018.  The plastic stent was removed.  Dr. Watt Climes placed a covered metal stent.  Dr. Paulita Fujita performed a repeat EUS.  A mass in the pancreas head measured 25 x 25 mm.  There was evidence of abutment of the superior mesenteric vein.  There was pancreatic duct dilatation.  A fine-needle aspiration biopsy was performed.  The tumor was staged as a T3N0 lesion by ultrasound.  The cytology on the bile duct stent revealed malignant cells consistent with adenocarcinoma.  The pancreas mass biopsy returned consistent with adenocarcinoma.  He is scheduled to see Dr. Barry Dienes tomorrow.  Objective:  Vital signs in last 24 hours:  Blood pressure 131/82, pulse 80, temperature 97.9 F (36.6 C), temperature source Oral, resp. rate 19, height 6\' 4"  (1.93 m), weight 203 lb 14.4 oz (92.5 kg), SpO2 100 %.    Physical examination: Not performed today  Lab Results:  Lab Results  Component Value Date   WBC 5.2 02/01/2019   HGB 12.5 (L) 02/01/2019   HCT 36.1 (L) 02/01/2019   MCV 90.3 02/01/2019   PLT 176 02/01/2019   NEUTROABS 2.9 12/19/2018   Blood smear: The platelets appear normal in number, few small platelet clumps.  The white cell morphology is unremarkable.  The polychromasia is not increased.  Few spherocytes, mild variation in red cell size CMP  Lab Results  Component Value Date   NA 133 (L) 02/02/2019   K 4.3 02/02/2019   CL 102 02/02/2019   CO2 22 02/02/2019   GLUCOSE 220 (H) 02/02/2019   BUN 18 02/02/2019   CREATININE 0.93 02/02/2019   CALCIUM 9.1 02/02/2019   PROT 7.0 02/01/2019   ALBUMIN 3.5 02/01/2019   AST 20 02/01/2019   ALT 29 02/01/2019   ALKPHOS 113 02/01/2019   BILITOT 1.1 02/01/2019   GFRNONAA >60 02/02/2019   GFRAA >60 02/02/2019     Imaging:  Dg Chest Port 1 View   Result Date: 02/02/2019 CLINICAL DATA:  Post Left Port-A-Catheter Insertion for Pancreatic Cancer. EXAM: PORTABLE CHEST 1 VIEW COMPARISON:  Chest CT, 12/20/2018 FINDINGS: Left anterior chest wall Port-A-Cath has been inserted through the left subclavian vein. Tip projects in the lower superior vena cava. No pneumothorax. Cardiac silhouette is normal in size. No mediastinal or hilar masses or convincing adenopathy. Clear lungs. IMPRESSION: 1. Well-positioned left anterior chest wall Port-A-Cath, tip projecting in the lower superior vena cava. 2. No acute cardiopulmonary disease. Electronically Signed   By: Lajean Manes M.D.   On: 02/02/2019 12:05   Dg Fluoro Guide Cv Line-no Report  Result Date: 02/02/2019 Fluoroscopy was utilized by the requesting physician.  No radiographic interpretation.    Medications: I have reviewed the patient's current medications.   Assessment/Plan: 1. Pancreas head mass  Presented with jaundice, bilirubin elevated at 21.4  CTs abdomen/pelvis 12/20/2018-severe intrahepatic and extrahepatic biliary dilatation due to a possible 3.5 x 2.5 cm solid mass in the pancreatic head.  Pancreatic ductal dilatation noted as well with multiple cystic lesions in the pancreatic tail and body.    CT chest 12/20/2018-occasional nonspecific small pulmonary nodules felt to likely be related to prior infection or inflammation.    ERCP by Dr. Watt Climes 12/21/2018.  The major papilla was on the rim of a diverticulum.  The minor papilla appeared  to be bulging.  A biopsy of the duodenum adjacent to the minor papilla was performed.  Plastic stent was placed into the ventral pancreatic duct.  Biliary sphincterotomy performed.  Covered metal stent placed into the common bile duct.  Bile duct brushings showed benign reactive/reparative changes.  Biopsy of polypoid duodenal mucosa suggestive of nodular peptic duodenitis, negative for dysplasia or malignancy.   Upper EUS by Dr. Paulita Fujita on 01/03/2019 showed a  few cystic lesions in the pancreatic body and pancreatic tail.  A stent was visualized in the common bile duct.  Mass identified in the pancreatic head with fine-needle aspiration performed.  Lesion appeared to abut or potentially superficially invade the superior mesenteric vein.  No lymphadenopathy.  No involvement of the superior mesenteric artery or celiac artery.  Pathology returned suspicious for malignancy.  Dr. Paulita Fujita notes if pathology shows adenocarcinoma the lesion would be staged T3 N0 MX.  Repeat ERCP and placement of a metal bile duct stent 01/24/2019  EUS 01/24/2019, T3N0 pancreas head mass with abutment of the SMV, FNA of the pancreas mass revealed adenocarcinoma, cytology from the bile duct stent revealed adenocarcinoma 2. Biliary obstruction status post stent placement 3. History of left lower extremity DVT 2017, 2018-on Eliquis. 4. Type 2 diabetes 5. Hypertension 6. Chronic kidney disease 7. Tobacco use 8. Anemia, macrocytic.  Transfused 2 units of blood 12/20/2018     Disposition: Brian Torres appears well.  The repeat EUS biopsy confirmed adenocarcinoma.  He appears to have borderline resectable pancreas cancer.  His case was presented at the GI tumor conference 01/25/2019.  The consensus opinion is to proceed with neoadjuvant FOLFIRINOX.  He is scheduled to see Dr. Barry Dienes tomorrow.  I discussed the prognosis and treatment options with Brian Torres.  His wife was present for the visit via telephone.  He agrees to proceed with FOLFIRINOX.  We reviewed potential toxicities associated with this regimen including the chance for nausea/vomiting, mucositis, diarrhea, alopecia, and hematologic toxicity.  We discussed the rash, hyperpigmentation, sun sensitivity, and hand/foot syndrome associated with 5-fluorouracil.  We reviewed the acute and delayed diarrhea associated with irinotecan.  We discussed the chance of an allergic reaction and various types of neuropathy with oxaliplatin.  He will  attend a chemotherapy teaching class.  We also discussed the likelihood of elevated blood sugar after receiving the Decadron premedication.  He will monitor his blood sugar and call us for high values.  He will avoid concentrated eats for a few days following chemotherapy.  I will refer him to Dr. Barry Dienes for Port-A-Cath placement.  The plan is to begin FOLFIRINOX on 02/06/2019.  The etiology of his anemia is unclear.  We will check a CBC and initiate additional diagnostic evaluation if he remains anemic when he returns for the chemotherapy teaching class on 02/01/2019.  We will refer him for genetic screening.  40 minutes were spent with the patient today.  The majority of the time was used for counseling and coordination of care. Betsy Coder, MD  02/03/2019  4:10 PM

## 2019-01-26 NOTE — Telephone Encounter (Signed)
Notified patient that Dr. Benay Spice has ordered for him to see a genetics counselor at [facility]. This is being done for all pancreas cancer cases. There is a family syndrome that can reveal being sensitive to certain chemotherapy medications. Will also send a script for antiemetics and EMLA cream to his pharmacy today.

## 2019-01-27 ENCOUNTER — Other Ambulatory Visit: Payer: Self-pay | Admitting: General Surgery

## 2019-01-30 ENCOUNTER — Other Ambulatory Visit: Payer: Self-pay

## 2019-01-30 ENCOUNTER — Encounter (HOSPITAL_BASED_OUTPATIENT_CLINIC_OR_DEPARTMENT_OTHER): Payer: Self-pay | Admitting: *Deleted

## 2019-02-01 ENCOUNTER — Other Ambulatory Visit: Payer: Self-pay

## 2019-02-01 ENCOUNTER — Inpatient Hospital Stay: Payer: Managed Care, Other (non HMO) | Attending: Oncology

## 2019-02-01 ENCOUNTER — Inpatient Hospital Stay: Payer: Managed Care, Other (non HMO)

## 2019-02-01 ENCOUNTER — Encounter (HOSPITAL_BASED_OUTPATIENT_CLINIC_OR_DEPARTMENT_OTHER)
Admission: RE | Admit: 2019-02-01 | Discharge: 2019-02-01 | Disposition: A | Discharge status: Home / Self Care | Payer: Managed Care, Other (non HMO) | Source: Ambulatory Visit | Attending: General Surgery | Admitting: General Surgery

## 2019-02-01 DIAGNOSIS — Z86718 Personal history of other venous thrombosis and embolism: Secondary | ICD-10-CM | POA: Insufficient documentation

## 2019-02-01 DIAGNOSIS — Z7901 Long term (current) use of anticoagulants: Secondary | ICD-10-CM | POA: Diagnosis not present

## 2019-02-01 DIAGNOSIS — Z5189 Encounter for other specified aftercare: Secondary | ICD-10-CM | POA: Diagnosis not present

## 2019-02-01 DIAGNOSIS — I129 Hypertensive chronic kidney disease with stage 1 through stage 4 chronic kidney disease, or unspecified chronic kidney disease: Secondary | ICD-10-CM | POA: Insufficient documentation

## 2019-02-01 DIAGNOSIS — E1122 Type 2 diabetes mellitus with diabetic chronic kidney disease: Secondary | ICD-10-CM | POA: Diagnosis not present

## 2019-02-01 DIAGNOSIS — D649 Anemia, unspecified: Secondary | ICD-10-CM | POA: Diagnosis not present

## 2019-02-01 DIAGNOSIS — Z5111 Encounter for antineoplastic chemotherapy: Secondary | ICD-10-CM | POA: Diagnosis not present

## 2019-02-01 DIAGNOSIS — D709 Neutropenia, unspecified: Secondary | ICD-10-CM | POA: Insufficient documentation

## 2019-02-01 DIAGNOSIS — Z833 Family history of diabetes mellitus: Secondary | ICD-10-CM | POA: Diagnosis not present

## 2019-02-01 DIAGNOSIS — N189 Chronic kidney disease, unspecified: Secondary | ICD-10-CM | POA: Insufficient documentation

## 2019-02-01 DIAGNOSIS — C250 Malignant neoplasm of head of pancreas: Secondary | ICD-10-CM | POA: Diagnosis not present

## 2019-02-01 DIAGNOSIS — Z811 Family history of alcohol abuse and dependence: Secondary | ICD-10-CM | POA: Diagnosis not present

## 2019-02-01 DIAGNOSIS — Z7984 Long term (current) use of oral hypoglycemic drugs: Secondary | ICD-10-CM | POA: Diagnosis not present

## 2019-02-01 DIAGNOSIS — Z79899 Other long term (current) drug therapy: Secondary | ICD-10-CM | POA: Diagnosis not present

## 2019-02-01 DIAGNOSIS — E119 Type 2 diabetes mellitus without complications: Secondary | ICD-10-CM | POA: Diagnosis not present

## 2019-02-01 DIAGNOSIS — K8689 Other specified diseases of pancreas: Secondary | ICD-10-CM

## 2019-02-01 DIAGNOSIS — F17200 Nicotine dependence, unspecified, uncomplicated: Secondary | ICD-10-CM | POA: Diagnosis not present

## 2019-02-01 LAB — COMPREHENSIVE METABOLIC PANEL
ALT: 29 U/L (ref 0–44)
AST: 20 U/L (ref 15–41)
Albumin: 3.5 g/dL (ref 3.5–5.0)
Alkaline Phosphatase: 113 U/L (ref 38–126)
Anion gap: 8 (ref 5–15)
BUN: 15 mg/dL (ref 6–20)
CO2: 24 mmol/L (ref 22–32)
Calcium: 9.4 mg/dL (ref 8.9–10.3)
Chloride: 101 mmol/L (ref 98–111)
Creatinine, Ser: 0.85 mg/dL (ref 0.61–1.24)
GFR calc Af Amer: >60 mL/min (ref >60)
GFR calc non Af Amer: >60 mL/min (ref >60)
Glucose, Bld: 249 mg/dL — ABNORMAL HIGH (ref 70–99)
Potassium: 5.6 mmol/L — ABNORMAL HIGH (ref 3.5–5.1)
Sodium: 133 mmol/L — ABNORMAL LOW (ref 135–145)
Total Bilirubin: 1.1 mg/dL (ref 0.3–1.2)
Total Protein: 7 g/dL (ref 6.5–8.1)

## 2019-02-01 LAB — CBC
HCT: 36.1 % — ABNORMAL LOW (ref 39.0–52.0)
Hemoglobin: 12.5 g/dL — ABNORMAL LOW (ref 13.0–17.0)
MCH: 31.3 pg (ref 26.0–34.0)
MCHC: 34.6 g/dL (ref 30.0–36.0)
MCV: 90.3 fL (ref 80.0–100.0)
Platelets: 176 10*3/uL (ref 150–400)
RBC: 4 MIL/uL — ABNORMAL LOW (ref 4.22–5.81)
RDW: 12.8 % (ref 11.5–15.5)
WBC: 5.2 10*3/uL (ref 4.0–10.5)
nRBC: 0 % (ref 0.0–0.2)

## 2019-02-01 LAB — RETICULOCYTES
Immature Retic Fract: 3.5 % (ref 2.3–15.9)
RBC.: 4.13 MIL/uL — ABNORMAL LOW (ref 4.22–5.81)
Retic Count, Absolute: 31 10*3/uL (ref 19.0–186.0)
Retic Ct Pct: 0.8 % (ref 0.4–3.1)

## 2019-02-01 LAB — SAVE SMEAR

## 2019-02-01 NOTE — Progress Notes (Signed)
Labs reviewed with Dr. Ambrose Pancoast for K 5.6.  Dr. Ambrose Pancoast would like potassium recheck DOS.  Spoke with patient and instructed to come in at [place] for recheck of labs.  Patient confirmed that he would arrive at [place].  Called Dr. Marlowe Aschoff office and left message re abnormal labs and plan to recheck DOS.

## 2019-02-01 NOTE — Progress Notes (Signed)
Gatorade G2 given to patient with instructions to complete by 0630 DOS.  Surgical scrub also given to patient with instructions for use.  Patient verbalized understanding of instructions.

## 2019-02-01 NOTE — Anesthesia Preprocedure Evaluation (Addendum)
Anesthesia Evaluation  Patient identified by MRN, date of birth, ID band Patient awake    Reviewed: Allergy & Precautions, NPO status , Patient's Chart, lab work & pertinent test results  Airway Mallampati: II  TM Distance: >3 FB Neck ROM: Full  Mouth opening: Limited Mouth Opening  Dental no notable dental hx. (+) Teeth Intact, Dental Advisory Given   Pulmonary Current Smoker,    Pulmonary exam normal breath sounds clear to auscultation       Cardiovascular hypertension, + DVT (on eliquis)  Normal cardiovascular exam Rhythm:Regular Rate:Normal     Neuro/Psych PSYCHIATRIC DISORDERS Depression    GI/Hepatic   Endo/Other  diabetes, Type 2, Oral Hypoglycemic Agents, Insulin Dependent  Renal/GU Renal InsufficiencyRenal disease     Musculoskeletal   Abdominal   Peds  Hematology   Anesthesia Other Findings Pancreatic cancer  Reproductive/Obstetrics                            Anesthesia Physical Anesthesia Plan  ASA: III  Anesthesia Plan: General   Post-op Pain Management:    Induction: Intravenous  PONV Risk Score and Plan: 1 and Dexamethasone, Ondansetron and Midazolam  Airway Management Planned: LMA  Additional Equipment:   Intra-op Plan:   Post-operative Plan: Extubation in OR  Informed Consent: I have reviewed the patients History and Physical, chart, labs and discussed the procedure including the risks, benefits and alternatives for the proposed anesthesia with the patient or authorized representative who has indicated his/her understanding and acceptance.     Dental advisory given  Plan Discussed with: CRNA  Anesthesia Plan Comments:         Anesthesia Quick Evaluation

## 2019-02-02 ENCOUNTER — Encounter (HOSPITAL_BASED_OUTPATIENT_CLINIC_OR_DEPARTMENT_OTHER)
Admission: RE | Disposition: A | Discharge status: Home / Self Care | Payer: Self-pay | Source: Home / Self Care | Attending: General Surgery

## 2019-02-02 ENCOUNTER — Ambulatory Visit (HOSPITAL_BASED_OUTPATIENT_CLINIC_OR_DEPARTMENT_OTHER): Payer: Managed Care, Other (non HMO) | Admitting: Anesthesiology

## 2019-02-02 ENCOUNTER — Encounter (HOSPITAL_BASED_OUTPATIENT_CLINIC_OR_DEPARTMENT_OTHER): Payer: Self-pay | Admitting: *Deleted

## 2019-02-02 ENCOUNTER — Telehealth: Payer: Self-pay

## 2019-02-02 ENCOUNTER — Ambulatory Visit (HOSPITAL_BASED_OUTPATIENT_CLINIC_OR_DEPARTMENT_OTHER)
Admission: RE | Admit: 2019-02-02 | Discharge: 2019-02-02 | Disposition: A | Discharge status: Home / Self Care | Payer: Managed Care, Other (non HMO) | Attending: General Surgery | Admitting: General Surgery

## 2019-02-02 ENCOUNTER — Ambulatory Visit (HOSPITAL_COMMUNITY): Payer: Managed Care, Other (non HMO)

## 2019-02-02 ENCOUNTER — Other Ambulatory Visit: Payer: Self-pay

## 2019-02-02 DIAGNOSIS — Z7984 Long term (current) use of oral hypoglycemic drugs: Secondary | ICD-10-CM | POA: Insufficient documentation

## 2019-02-02 DIAGNOSIS — Z7901 Long term (current) use of anticoagulants: Secondary | ICD-10-CM | POA: Insufficient documentation

## 2019-02-02 DIAGNOSIS — Z79899 Other long term (current) drug therapy: Secondary | ICD-10-CM | POA: Insufficient documentation

## 2019-02-02 DIAGNOSIS — Z811 Family history of alcohol abuse and dependence: Secondary | ICD-10-CM | POA: Insufficient documentation

## 2019-02-02 DIAGNOSIS — Z95828 Presence of other vascular implants and grafts: Secondary | ICD-10-CM

## 2019-02-02 DIAGNOSIS — C250 Malignant neoplasm of head of pancreas: Secondary | ICD-10-CM | POA: Diagnosis not present

## 2019-02-02 DIAGNOSIS — Z833 Family history of diabetes mellitus: Secondary | ICD-10-CM | POA: Insufficient documentation

## 2019-02-02 DIAGNOSIS — Z419 Encounter for procedure for purposes other than remedying health state, unspecified: Secondary | ICD-10-CM

## 2019-02-02 DIAGNOSIS — E119 Type 2 diabetes mellitus without complications: Secondary | ICD-10-CM | POA: Insufficient documentation

## 2019-02-02 DIAGNOSIS — F17200 Nicotine dependence, unspecified, uncomplicated: Secondary | ICD-10-CM | POA: Insufficient documentation

## 2019-02-02 HISTORY — DX: Malignant (primary) neoplasm, unspecified: C80.1

## 2019-02-02 HISTORY — DX: Essential (primary) hypertension: I10

## 2019-02-02 HISTORY — PX: PORTACATH PLACEMENT: SHX2246

## 2019-02-02 LAB — BASIC METABOLIC PANEL
Anion gap: 9 (ref 5–15)
BUN: 18 mg/dL (ref 6–20)
CO2: 22 mmol/L (ref 22–32)
Calcium: 9.1 mg/dL (ref 8.9–10.3)
Chloride: 102 mmol/L (ref 98–111)
Creatinine, Ser: 0.93 mg/dL (ref 0.61–1.24)
GFR calc Af Amer: >60 mL/min (ref >60)
GFR calc non Af Amer: >60 mL/min (ref >60)
Glucose, Bld: 220 mg/dL — ABNORMAL HIGH (ref 70–99)
Potassium: 4.3 mmol/L (ref 3.5–5.1)
Sodium: 133 mmol/L — ABNORMAL LOW (ref 135–145)

## 2019-02-02 LAB — CANCER ANTIGEN 19-9: CA 19-9: 658 U/mL — ABNORMAL HIGH (ref 0–35)

## 2019-02-02 SURGERY — INSERTION, TUNNELED CENTRAL VENOUS DEVICE, WITH PORT
Anesthesia: General | Laterality: Left

## 2019-02-02 MED ORDER — ACETAMINOPHEN 500 MG PO TABS
ORAL_TABLET | ORAL | Status: AC
  Filled 2019-02-02: qty 2

## 2019-02-02 MED ORDER — PROPOFOL 10 MG/ML IV BOLUS
INTRAVENOUS | Status: AC
  Filled 2019-02-02: qty 20

## 2019-02-02 MED ORDER — HEPARIN (PORCINE) IN NACL 2-0.9 UNITS/ML
INTRAMUSCULAR | Status: AC | PRN
  Administered 2019-02-02: 1 via INTRAVENOUS

## 2019-02-02 MED ORDER — LACTATED RINGERS IV SOLN
INTRAVENOUS | Status: DC
  Administered 2019-02-02: 09:00:00 via INTRAVENOUS

## 2019-02-02 MED ORDER — ACETAMINOPHEN 500 MG PO TABS
1000.0000 mg | ORAL_TABLET | ORAL | Status: AC
  Administered 2019-02-02: 08:00:00 1000 mg via ORAL

## 2019-02-02 MED ORDER — BUPIVACAINE HCL (PF) 0.25 % IJ SOLN
INTRAMUSCULAR | Status: AC
  Filled 2019-02-02: qty 30

## 2019-02-02 MED ORDER — CEFAZOLIN SODIUM-DEXTROSE 2-4 GM/100ML-% IV SOLN
2.0000 g | INTRAVENOUS | Status: AC
  Administered 2019-02-02: 11:00:00 2 g via INTRAVENOUS

## 2019-02-02 MED ORDER — OXYCODONE HCL 5 MG PO TABS: 5.0000 mg | ORAL_TABLET | Freq: Four times a day (QID) | ORAL | 0 refills | Status: DC | PRN

## 2019-02-02 MED ORDER — HEPARIN (PORCINE) IN NACL 1000-0.9 UT/500ML-% IV SOLN
INTRAVENOUS | Status: AC
  Filled 2019-02-02: qty 500

## 2019-02-02 MED ORDER — ONDANSETRON HCL 4 MG/2ML IJ SOLN
INTRAMUSCULAR | Status: DC | PRN
  Administered 2019-02-02: 4 mg via INTRAVENOUS

## 2019-02-02 MED ORDER — ACETAMINOPHEN 500 MG PO TABS: 1000.0000 mg | ORAL_TABLET | Freq: Once | ORAL | Status: DC

## 2019-02-02 MED ORDER — PROPOFOL 10 MG/ML IV BOLUS
INTRAVENOUS | Status: DC | PRN
  Administered 2019-02-02: 150 mg via INTRAVENOUS

## 2019-02-02 MED ORDER — LIDOCAINE HCL 1 % IJ SOLN
INTRAMUSCULAR | Status: DC | PRN
  Administered 2019-02-02: 11 mL via SUBCUTANEOUS

## 2019-02-02 MED ORDER — LIDOCAINE HCL (PF) 1 % IJ SOLN
INTRAMUSCULAR | Status: AC
  Filled 2019-02-02: qty 30

## 2019-02-02 MED ORDER — FENTANYL CITRATE (PF) 100 MCG/2ML IJ SOLN: 25.0000 ug | INTRAMUSCULAR | Status: DC | PRN

## 2019-02-02 MED ORDER — GABAPENTIN 300 MG PO CAPS
ORAL_CAPSULE | ORAL | Status: AC
  Filled 2019-02-02: qty 1

## 2019-02-02 MED ORDER — LIDOCAINE 2% (20 MG/ML) 5 ML SYRINGE
INTRAMUSCULAR | Status: AC
  Filled 2019-02-02: qty 5

## 2019-02-02 MED ORDER — LIDOCAINE HCL (CARDIAC) PF 100 MG/5ML IV SOSY
PREFILLED_SYRINGE | INTRAVENOUS | Status: DC | PRN
  Administered 2019-02-02: 20 mg via INTRAVENOUS
  Administered 2019-02-02: 80 mg via INTRAVENOUS

## 2019-02-02 MED ORDER — SCOPOLAMINE 1 MG/3DAYS TD PT72: 1.0000 | MEDICATED_PATCH | Freq: Once | TRANSDERMAL | Status: DC | PRN

## 2019-02-02 MED ORDER — MIDAZOLAM HCL 2 MG/2ML IJ SOLN
1.0000 mg | INTRAMUSCULAR | Status: DC | PRN
  Administered 2019-02-02: 2 mg via INTRAVENOUS

## 2019-02-02 MED ORDER — DEXAMETHASONE SODIUM PHOSPHATE 10 MG/ML IJ SOLN
INTRAMUSCULAR | Status: DC | PRN
  Administered 2019-02-02: 10 mg via INTRAVENOUS

## 2019-02-02 MED ORDER — DEXAMETHASONE SODIUM PHOSPHATE 10 MG/ML IJ SOLN
INTRAMUSCULAR | Status: AC
  Filled 2019-02-02: qty 1

## 2019-02-02 MED ORDER — MIDAZOLAM HCL 2 MG/2ML IJ SOLN
INTRAMUSCULAR | Status: AC
  Filled 2019-02-02: qty 2

## 2019-02-02 MED ORDER — FENTANYL CITRATE (PF) 100 MCG/2ML IJ SOLN
50.0000 ug | INTRAMUSCULAR | Status: AC | PRN
  Administered 2019-02-02: 25 ug via INTRAVENOUS
  Administered 2019-02-02: 50 ug via INTRAVENOUS
  Administered 2019-02-02: 25 ug via INTRAVENOUS

## 2019-02-02 MED ORDER — ONDANSETRON HCL 4 MG/2ML IJ SOLN
INTRAMUSCULAR | Status: AC
  Filled 2019-02-02: qty 2

## 2019-02-02 MED ORDER — CEFAZOLIN SODIUM-DEXTROSE 2-4 GM/100ML-% IV SOLN
INTRAVENOUS | Status: AC
  Filled 2019-02-02: qty 100

## 2019-02-02 MED ORDER — FENTANYL CITRATE (PF) 100 MCG/2ML IJ SOLN
INTRAMUSCULAR | Status: AC
  Filled 2019-02-02: qty 2

## 2019-02-02 MED ORDER — HEPARIN SOD (PORK) LOCK FLUSH 100 UNIT/ML IV SOLN
INTRAVENOUS | Status: DC | PRN
  Administered 2019-02-02: 500 [IU] via INTRAVENOUS

## 2019-02-02 MED ORDER — BUPIVACAINE-EPINEPHRINE (PF) 0.25% -1:200000 IJ SOLN
INTRAMUSCULAR | Status: AC
  Filled 2019-02-02: qty 30

## 2019-02-02 MED ORDER — CHLORHEXIDINE GLUCONATE CLOTH 2 % EX PADS: 6.0000 | MEDICATED_PAD | Freq: Once | CUTANEOUS | Status: DC

## 2019-02-02 MED ORDER — HEPARIN SOD (PORK) LOCK FLUSH 100 UNIT/ML IV SOLN
INTRAVENOUS | Status: AC
  Filled 2019-02-02: qty 5

## 2019-02-02 MED ORDER — GABAPENTIN 300 MG PO CAPS
300.0000 mg | ORAL_CAPSULE | ORAL | Status: AC
  Administered 2019-02-02: 300 mg via ORAL

## 2019-02-02 SURGICAL SUPPLY — 43 items
ADH SKN CLS APL DERMABOND .7 (GAUZE/BANDAGES/DRESSINGS) ×1
APL PRP STRL LF DISP 70% ISPRP (MISCELLANEOUS) ×1
BAG DECANTER FOR FLEXI CONT (MISCELLANEOUS) ×3 IMPLANT
BLADE HEX COATED 2.75 (ELECTRODE) ×3 IMPLANT
BLADE SURG 11 STRL SS (BLADE) ×3 IMPLANT
BLADE SURG 15 STRL LF DISP TIS (BLADE) ×1 IMPLANT
BLADE SURG 15 STRL SS (BLADE) ×3
CHLORAPREP W/TINT 26 (MISCELLANEOUS) ×3 IMPLANT
COVER BACK TABLE REUSABLE LG (DRAPES) ×3 IMPLANT
COVER MAYO STAND REUSABLE (DRAPES) ×3 IMPLANT
COVER WAND RF STERILE (DRAPES) IMPLANT
DECANTER SPIKE VIAL GLASS SM (MISCELLANEOUS) IMPLANT
DERMABOND ADVANCED (GAUZE/BANDAGES/DRESSINGS) ×2
DERMABOND ADVANCED .7 DNX12 (GAUZE/BANDAGES/DRESSINGS) ×1 IMPLANT
DRAPE C-ARM 42X72 X-RAY (DRAPES) ×3 IMPLANT
DRAPE LAPAROTOMY TRNSV 102X78 (DRAPE) ×3 IMPLANT
DRAPE UTILITY XL STRL (DRAPES) ×3 IMPLANT
DRSG TEGADERM 4X4.75 (GAUZE/BANDAGES/DRESSINGS) IMPLANT
ELECT REM PT RETURN 9FT ADLT (ELECTROSURGICAL) ×3
ELECTRODE REM PT RTRN 9FT ADLT (ELECTROSURGICAL) ×1 IMPLANT
GAUZE SPONGE 4X4 12PLY STRL LF (GAUZE/BANDAGES/DRESSINGS) IMPLANT
GLOVE BIO SURGEON STRL SZ 6 (GLOVE) ×3 IMPLANT
GLOVE BIOGEL PI IND STRL 6.5 (GLOVE) ×1 IMPLANT
GLOVE BIOGEL PI INDICATOR 6.5 (GLOVE) ×2
GOWN STRL REUS W/ TWL LRG LVL3 (GOWN DISPOSABLE) ×1 IMPLANT
GOWN STRL REUS W/TWL 2XL LVL3 (GOWN DISPOSABLE) ×3 IMPLANT
GOWN STRL REUS W/TWL LRG LVL3 (GOWN DISPOSABLE) ×3
IV CONNECTOR ONE LINK NDLESS (IV SETS) IMPLANT
KIT PORT POWER 8FR ISP CVUE (Port) ×2 IMPLANT
NDL HYPO 25X1 1.5 SAFETY (NEEDLE) ×1 IMPLANT
NEEDLE HYPO 25X1 1.5 SAFETY (NEEDLE) ×3 IMPLANT
PACK BASIN DAY SURGERY FS (CUSTOM PROCEDURE TRAY) ×3 IMPLANT
PENCIL BUTTON HOLSTER BLD 10FT (ELECTRODE) ×3 IMPLANT
SLEEVE SCD COMPRESS KNEE MED (MISCELLANEOUS) ×3 IMPLANT
SUT MNCRL AB 4-0 PS2 18 (SUTURE) ×3 IMPLANT
SUT PROLENE 2 0 SH DA (SUTURE) ×6 IMPLANT
SUT VIC AB 3-0 SH 27 (SUTURE) ×3
SUT VIC AB 3-0 SH 27X BRD (SUTURE) ×1 IMPLANT
SUT VICRYL 3-0 CR8 SH (SUTURE) IMPLANT
SYR 10ML LL (SYRINGE) ×3 IMPLANT
SYR 5ML LUER SLIP (SYRINGE) ×5 IMPLANT
SYR CONTROL 10ML LL (SYRINGE) ×3 IMPLANT
TOWEL GREEN STERILE FF (TOWEL DISPOSABLE) ×3 IMPLANT

## 2019-02-02 NOTE — Anesthesia Postprocedure Evaluation (Signed)
Anesthesia Post Note  Patient: NATION CRADLE  Procedure(s) Performed: INSERTION PORT-A-CATH WITH POSSIBLE ULTRASOUND (N/A )     Patient location during evaluation: PACU Anesthesia Type: General Level of consciousness: awake and alert Pain management: pain level controlled Vital Signs Assessment: post-procedure vital signs reviewed and stable Respiratory status: spontaneous breathing, nonlabored ventilation, respiratory function stable and patient connected to nasal cannula oxygen Cardiovascular status: blood pressure returned to baseline and stable Postop Assessment: no apparent nausea or vomiting Anesthetic complications: no    Last Vitals:  Vitals:   02/02/19 1215 02/02/19 1239  BP: 115/73 122/76  Pulse: 71 67  Resp: 14 18  Temp:  36.9 C  SpO2: 100% 100%    Last Pain:  Vitals:   02/02/19 1239  TempSrc:   PainSc: 0-No pain                 Kilan Banfill L Jenet Durio

## 2019-02-02 NOTE — Interval H&P Note (Signed)
History and Physical Interval Note:  02/02/2019 10:34 AM  Brian Torres  has presented today for surgery, with the diagnosis of pancreatic cancer.  The various methods of treatment have been discussed with the patient and family. After consideration of risks, benefits and other options for treatment, the patient has consented to  Procedure(s): INSERTION PORT-A-CATH WITH POSSIBLE ULTRASOUND (N/A) as a surgical intervention.  The patient's history has been reviewed, patient examined, no change in status, stable for surgery.  I have reviewed the patient's chart and labs.  Questions were answered to the patient's satisfaction.     [name]

## 2019-02-02 NOTE — H&P (Signed)
Brian Torres Appointment: 01/27/2019 9:45 AM Location: [facility] Patient #: [id number] DOB: 03/21/1961 Married / Language: Brian Torres / Race: White Male   History of Present Illness Brian Klein MD; 01/27/2019 11:06 AM) The patient is a 58 year old male who presents with pancreatic cancer. Patient is a 58 year old male who presented on February with jaundice. He is referred to me by Brian Torres for consultation regarding diagnosis of pancreatic cancer. This was found based on imaging with his presentation of jaundice. He did have some weight loss due to decreased appetite. He has been gaining some of this weight back. He underwent ERCP and EUS. Original pathology was not diagnostic. His stent clogged and he had switch out for metal stent. Cytology on second study was positive for adenocarcinoma. He does appear to have abutment/invasion of the superior mesenteric vein. The mass is approximately 3.5 cm in greatest dimension. There is significant dilatation of the pancreatic duct. He denies diarrhea. His diabetes has been going a little bit crazy. He denies any family history of cancer.  CA 19-9 was 10.7 2/28 when t bili was down to 5.    cytology 01/24/2019 Diagnosis FINE NEEDLE ASPIRATION, ENDOSCOPIC, PANCREAS HEAD(SPECIMEN 2 OF 3 COLLECTED 01/24/19): MALIGNANT CELLS CONSISTENT WITH ADENOCARCINOMA.  CT 12/20/2018  IMPRESSION: Severe intrahepatic and extrahepatic biliary dilatation is noted due to possible 3.5 x 2.5 cm solid mass in pancreatic head concerning for malignancy. Pancreatic ductal dilatation is noted as well with multiple cystic lesions noted in the pancreatic body and tail. MRI of the pancreas is recommended when the patient can follow breathing instructions and hold still.  EUS 01/24/19 outlaw ENDOSONOGRAPHIC FINDING: Findings: One stent was visualized endosonographically in the common bile duct. Extension of the stent was noted in the common bile  duct. Bile duct stent removed with cold forceps and sent for pathology. An oval mass was identified in the pancreatic head. The mass was hypoechoic. The mass measured 25 mm by 25 mm in maximal cross-sectional diameter. The endosonographic borders were poorly-defined. There was sonographic evidence suggesting invasion into the superior mesenteric vein (manifested by abutment). The remainder of the pancreas was examined. The endosonographic appearance of parenchyma and the upstream pancreatic duct indicated duct dilation. Fine needle aspiration for cytology was performed. Color Doppler imaging was utilized prior to needle puncture to confirm a lack of significant vascular structures within the needle path. Three passes were made with the 25 gauge needle using a transduodenal approach. A stylet was used. A cytotechnologist was present to evaluate the adequacy of the specimen. The cellularity of the specimen was adequate. Final cytology results are pending. There was dilation in the common bile duct which measured up to 10 mm. The pancreatic duct had a dilated endosonographic appearance in the entire pancreas.  ERCP magod 01/24/2019 When we brought the ampullary area into view we saw the pancreatic stent which was almost completely out of the duct and the plastic stent was removed from the pancreatic duct using a Brian Torres grasping device and sent for cytology. Deep selective cannulation was obtained using the sphincterotome loaded with the Brian Torres on the first attempt and the sphincterotome was exchanged with the balloon and the biliary tree was swept with an adjustable 9- 12 mm balloon starting at the bifurcation. Sludge was swept from the duct. We proceeded with a few more balloon pull-through's and nothing was found. One 10 Fr by 6 cm covered metal stent was placed 5.5 cm into the common bile duct. Bile flowed  through the stent. The stent was in good position. There were no pancreatic injection  or wire advancement throughout the procedure and there was adequate biliary drainage at the end of the procedure   Diagnostic Studies History Brian Torres, RMA; 01/27/2019 9:37 AM) Colonoscopy  never  Allergies Brian Torres, RMA; 01/27/2019 9:38 AM) No Known Drug Allergies [01/27/2019]: Allergies Reconciled   Medication History Brian Torres, [place]; 01/27/2019 9:42 AM) Eliquis (5MG  Tablet, Oral) Active. Rosuvastatin Calcium (5MG  Tablet, Oral) Active. Lisinopril (10MG  Tablet, Oral) Active. Pioglitazone HCl (45MG  Tablet, Oral) Active. Glimepiride (4MG  Tablet, Oral) Active. Omega 3 (1000MG  Capsule, Oral) Active. Co Q-10 (30MG  Capsule, Oral) Active. Ferrous Sulfate (325 (65 Fe)MG Tablet, Oral) Active. Zofran (4MG  Tablet, Oral) Active. Multi-Vitamin (Oral) Active. Compazine (10MG  Tablet, Oral) Active. Sildenafil Citrate (20MG  Tablet, Oral) Active. Medications Reconciled  Social History Brian Torres, [place]; 01/27/2019 9:37 AM) Caffeine use  Carbonated beverages, Coffee. No alcohol use  No drug use  Tobacco use  Current every day smoker.  Family History Brian Torres, [place]; 01/27/2019 9:37 AM) Alcohol Abuse  Brother. Diabetes Mellitus  Father, Mother.  Other Problems Brian Torres, [place]; 01/27/2019 9:37 AM) Diabetes Mellitus  Pancreatic Cancer     Review of Systems Brian Torres RMA; 01/27/2019 9:37 AM) General Present- Weight Loss. Not Present- Appetite Loss, Chills, Fatigue, Fever, Night Sweats and Weight Gain. Skin Not Present- Change in Wart/Mole, Dryness, Hives, Jaundice, New Lesions, Non-Healing Wounds, Rash and Ulcer. HEENT Present- Seasonal Allergies. Not Present- Earache, Hearing Loss, Hoarseness, Nose Bleed, Oral Ulcers, Ringing in the Ears, Sinus Pain, Sore Throat, Visual Disturbances, Wears glasses/contact lenses and Yellow Eyes. Respiratory Not Present- Bloody sputum, Chronic Cough, Difficulty Breathing, Snoring and Wheezing. Breast Not  Present- Breast Mass, Breast Pain, Nipple Discharge and Skin Changes. Cardiovascular Not Present- Chest Pain, Difficulty Breathing Lying Down, Leg Cramps, Palpitations, Rapid Heart Rate, Shortness of Breath and Swelling of Extremities. Gastrointestinal Not Present- Abdominal Pain, Bloating, Bloody Stool, Change in Bowel Habits, Chronic diarrhea, Constipation, Difficulty Swallowing, Excessive gas, Gets full quickly at meals, Hemorrhoids, Indigestion, Nausea, Rectal Pain and Vomiting. Male Genitourinary Not Present- Blood in Urine, Change in Urinary Stream, Frequency, Impotence, Nocturia, Painful Urination, Urgency and Urine Leakage. Musculoskeletal Not Present- Back Pain, Joint Pain, Joint Stiffness, Muscle Pain, Muscle Weakness and Swelling of Extremities. Neurological Not Present- Decreased Memory, Fainting, Headaches, Numbness, Seizures, Tingling, Tremor, Trouble walking and Weakness. Psychiatric Not Present- Anxiety, Bipolar, Change in Sleep Pattern, Depression, Fearful and Frequent crying. Endocrine Not Present- Cold Intolerance, Excessive Hunger, Hair Changes, Heat Intolerance, Hot flashes and New Diabetes. Hematology Present- Blood Thinners and Easy Bruising. Not Present- Excessive bleeding, Gland problems, HIV and Persistent Infections.  Vitals Brian Torres RMA; 01/27/2019 9:38 AM) 01/27/2019 9:37 AM Weight: 200.6 lb Height: 76in Body Surface Area: 2.22 m Body Mass Index: 24.42 kg/m  Temp.: 98.6F  Pulse: 99 (Regular)  BP: 132/80 (Sitting, Left Arm, Standard)       Physical Exam Brian Klein MD; 01/27/2019 11:06 AM) General Mental Status-Alert. General Appearance-Consistent with stated age. Hydration-Well hydrated. Voice-Normal.  Head and Neck Head-normocephalic, atraumatic with no lesions or palpable masses. Trachea-midline. Thyroid Gland Characteristics - normal size and consistency.  Eye Eyeball - Bilateral-Extraocular movements  intact. Sclera/Conjunctiva - Bilateral-No scleral icterus.  Chest and Lung Exam Chest and lung exam reveals -quiet, even and easy respiratory effort with no use of accessory muscles and on auscultation, normal breath sounds, no adventitious sounds and normal vocal resonance. Inspection Chest Wall - Normal. Back - normal.  Cardiovascular Cardiovascular examination  reveals -normal heart sounds, regular rate and rhythm with no murmurs and normal pedal pulses bilaterally.  Abdomen Inspection Inspection of the abdomen reveals - No Hernias. Palpation/Percussion Palpation and Percussion of the abdomen reveal - Soft, Non Tender, No Rebound tenderness, No Rigidity (guarding) and No hepatosplenomegaly. Auscultation Auscultation of the abdomen reveals - Bowel sounds normal.  Neurologic Neurologic evaluation reveals -alert and oriented x 3 with no impairment of recent or remote memory. Mental Status-Normal.  Musculoskeletal Global Assessment -Note: no gross deformities.  Normal Exam - Left-Upper Extremity Strength Normal and Lower Extremity Strength Normal. Normal Exam - Right-Upper Extremity Strength Normal and Lower Extremity Strength Normal.  Lymphatic Head & Neck  General Head & Neck Lymphatics: Bilateral - Description - Normal. Axillary  General Axillary Region: Bilateral - Description - Normal. Tenderness - Non Tender. Femoral & Inguinal  Generalized Femoral & Inguinal Lymphatics: Bilateral - Description - No Generalized lymphadenopathy.    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 We recommend neoadjuvant chemotherapy for him. Hopefully this will help [place] achieve a negative margin on the vein. I will plan a Port-A-Cath next week. He has chemotherapy scheduled to start April 6. I discussed port placement with him as well as  risks. He is at higher risk than normal for bleeding given the fact that he will need anticoagulation.  I also reviewed anatomy of the pancreas and the upper GI tract. I discussed Whipple with the patient and his wife. I reviewed risks of this surgery as well. We will plan to follow the patient up following his posttreatment scans.  Of note, they go to church with Audree Bane upon whom I did a Whipple several years ago. Current Plans Pt Education - [facility] port insertion education Pt Education - flb whipple pt info ANTICOAGULATED BY ANTICOAGULATION TREATMENT (Z79.01) Impression: Will ask Brian Torres regarding holding eliquis, giving lovenox.

## 2019-02-02 NOTE — Anesthesia Procedure Notes (Signed)
Procedure Name: LMA Insertion Date/Time: 02/02/2019 10:58 AM Performed by: Justice Rocher, CRNA Pre-anesthesia Checklist: Patient identified, Emergency Drugs available, Suction available and Patient being monitored Patient Re-evaluated:Patient Re-evaluated prior to induction Oxygen Delivery Method: Circle system utilized Preoxygenation: Pre-oxygenation with 100% oxygen Induction Type: IV induction Ventilation: Mask ventilation without difficulty LMA: LMA inserted LMA Size: 4.0 Number of attempts: 1 Airway Equipment and Method: Bite block Placement Confirmation: positive ETCO2 and breath sounds checked- equal and bilateral Tube secured with: Tape Dental Injury: Teeth and Oropharynx as per pre-operative assessment

## 2019-02-02 NOTE — Discharge Instructions (Addendum)
[place] Office Phone Number [phone]   POST OP INSTRUCTIONS  Always review your discharge instruction sheet given to you by the facility where your surgery was performed.  IF YOU HAVE DISABILITY OR FAMILY LEAVE FORMS, YOU MUST BRING THEM TO THE OFFICE FOR PROCESSING.  DO NOT GIVE THEM TO YOUR DOCTOR.  1. A prescription for pain medication may be given to you upon discharge.  Take your pain medication as prescribed, if needed.  If narcotic pain medicine is not needed, then you may take acetaminophen (Tylenol) or ibuprofen (Advil) as needed. 2. Take your usually prescribed medications unless otherwise directed 3. If you need a refill on your pain medication, please contact your pharmacy.  They will contact our office to request authorization.  Prescriptions will not be filled after 5pm or on week-ends. 4. You should eat very light the first 24 hours after surgery, such as soup, crackers, pudding, etc.  Resume your normal diet the day after surgery 5. It is common to experience some constipation if taking pain medication after surgery.  Increasing fluid intake and taking a stool softener will usually help or prevent this problem from occurring.  A mild laxative (Milk of Magnesia or Miralax) should be taken according to package directions if there are no bowel movements after 48 hours. 6. You may shower in 48 hours.  The surgical glue will flake off in 2-3 weeks.   7. ACTIVITIES:  No strenuous activity or heavy lifting for 1 week.   a. You may drive when you no longer are taking prescription pain medication, you can comfortably wear a seatbelt, and you can safely maneuver your car and apply brakes. b. RETURN TO WORK:  __________1 week if applicable_______________ Brian Torres should see your doctor in the office for a follow-up appointment approximately three-four weeks after your surgery.    WHEN TO CALL YOUR DOCTOR: 1. Fever over 101.0 2. Nausea and/or vomiting. 3. Extreme  swelling or bruising. 4. Continued bleeding from incision. 5. Increased pain, redness, or drainage from the incision.  The clinic staff is available to answer your questions during regular business hours.  Please dont hesitate to call and ask to speak to one of the nurses for clinical concerns.  If you have a medical emergency, go to the nearest emergency room or call 911.  A surgeon from [facility] is always on call at the hospital.  For further questions, please visit [email or web address]     Post Anesthesia Home Care Instructions  Activity: Get plenty of rest for the remainder of the day. A responsible individual must stay with you for 24 hours following the procedure.  For the next 24 hours, DO NOT: -Drive a car -Paediatric nurse -Drink alcoholic beverages -Take any medication unless instructed by your physician -Make any legal decisions or sign important papers.  Meals: Start with liquid foods such as gelatin or soup. Progress to regular foods as tolerated. Avoid greasy, spicy, heavy foods. If nausea and/or vomiting occur, drink only clear liquids until the nausea and/or vomiting subsides. Call your physician if vomiting continues.  Special Instructions/Symptoms: Your throat may feel dry or sore from the anesthesia or the breathing tube placed in your throat during surgery. If this causes discomfort, gargle with warm salt water. The discomfort should disappear within 24 hours.

## 2019-02-02 NOTE — Op Note (Signed)
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      SURGEON:  [name], MD      ANESTHESIA:  General   FINDINGS:  Good venous return, easy flush, and tip of the catheter and   SVC 26 cm.      SPECIMEN:  None.      ESTIMATED BLOOD LOSS:  Minimal.      COMPLICATIONS:  None known.      PROCEDURE:  Pt was identified in the holding area and taken to   the operating room, where patient was placed supine on the operating room   table.  General anesthesia was induced.  Patient's arms were tucked and the upper   chest and neck were prepped and draped in sterile fashion.  Time-out was   performed according to the surgical safety check list.  When all was   correct, we continued.   Local anesthetic was administered over this   area at the angle of the clavicle.  The vein was accessed with 1 pass(es) of the needle. There was good venous return and the wire passed easily with no ectopy.   Fluoroscopy was used to confirm that the wire was in the vena cava.      The patient was placed back level and the area for the pocket was anethetized   with local anesthetic.  A 3-cm transverse incision was made with a #15   blade.  Cautery was used to divide the subcutaneous tissues down to the   pectoralis muscle.  An [employer] retractor was used to elevate the skin   while a pocket was created on top of the pectoralis fascia.  The port   was placed into the pocket to confirm that it was of adequate size.  The   catheter was preattached to the port.  The port was then secured to the   pectoralis fascia with four 2-0 Prolene sutures.  These were clamped and   not tied down yet.    The catheter was tunneled through to the wire exit   site.  The catheter was placed along the wire to determine what length it should be to be in the SVC.  The catheter was cut at  26 cm.  The tunneler sheath and dilator were passed over the wire and the dilator and wire were removed.  The catheter was advanced through the tunneler sheath and the tunneler sheath was pulled away.  Care was taken to keep the catheter in the tunneler sheath as this occurred. This was advanced and the tunneler sheath was removed.  There was good venous   return and easy flush of the catheter.  The Prolene sutures were tied   down to the pectoral fascia.  The skin was reapproximated using 3-0   Vicryl interrupted deep dermal sutures.    Fluoroscopy was used to re-confirm good position of the catheter.  The skin   was then closed using 4-0 Monocryl in a subcuticular fashion.  The port was flushed with concentrated heparin flush as well.  The wounds were then cleaned, dried, and dressed with Dermabond.  The patient was awakened from anesthesia and taken to the PACU in stable condition.  Needle, sponge, and instrument counts were correct.               [name], MD

## 2019-02-02 NOTE — Telephone Encounter (Signed)
Called patient's wife to offer support, left VM including my call back number for questions or concerns.

## 2019-02-02 NOTE — Transfer of Care (Signed)
Immediate Anesthesia Transfer of Care Note  Patient: Brian Torres  Procedure(s) Performed: Procedure(s) (LRB): INSERTION PORT-A-CATH WITH POSSIBLE ULTRASOUND (N/A)  Patient Location: PACU  Anesthesia Type: General  Level of Consciousness: awake, sedated, patient cooperative and responds to stimulation  Airway & Oxygen Therapy: Patient Spontanous on RA - alert awake   Post-op Assessment: Report given to PACU RN, Post -op Vital signs reviewed and stable and Patient moving all extremities  Post vital signs: Reviewed and stable  Complications: No apparent anesthesia complications

## 2019-02-03 ENCOUNTER — Encounter (HOSPITAL_BASED_OUTPATIENT_CLINIC_OR_DEPARTMENT_OTHER): Payer: Self-pay | Admitting: General Surgery

## 2019-02-05 ENCOUNTER — Other Ambulatory Visit: Payer: Self-pay | Admitting: Oncology

## 2019-02-05 DIAGNOSIS — C250 Malignant neoplasm of head of pancreas: Secondary | ICD-10-CM

## 2019-02-06 ENCOUNTER — Inpatient Hospital Stay (HOSPITAL_BASED_OUTPATIENT_CLINIC_OR_DEPARTMENT_OTHER): Payer: Managed Care, Other (non HMO) | Admitting: Oncology

## 2019-02-06 ENCOUNTER — Inpatient Hospital Stay: Payer: Managed Care, Other (non HMO)

## 2019-02-06 ENCOUNTER — Other Ambulatory Visit: Payer: Self-pay

## 2019-02-06 ENCOUNTER — Encounter: Payer: Self-pay | Admitting: Oncology

## 2019-02-06 ENCOUNTER — Telehealth: Payer: Self-pay

## 2019-02-06 ENCOUNTER — Ambulatory Visit (HOSPITAL_BASED_OUTPATIENT_CLINIC_OR_DEPARTMENT_OTHER): Payer: Managed Care, Other (non HMO) | Admitting: Medical

## 2019-02-06 ENCOUNTER — Telehealth: Payer: Self-pay | Admitting: Oncology

## 2019-02-06 VITALS — BP 151/83 | HR 68 | Temp 98.4°F | Resp 18

## 2019-02-06 VITALS — BP 137/80 | HR 74 | Temp 97.9°F | Resp 18 | Ht 76.0 in | Wt 201.8 lb

## 2019-02-06 DIAGNOSIS — Z7901 Long term (current) use of anticoagulants: Secondary | ICD-10-CM

## 2019-02-06 DIAGNOSIS — Z86718 Personal history of other venous thrombosis and embolism: Secondary | ICD-10-CM

## 2019-02-06 DIAGNOSIS — E1122 Type 2 diabetes mellitus with diabetic chronic kidney disease: Secondary | ICD-10-CM | POA: Diagnosis not present

## 2019-02-06 DIAGNOSIS — C250 Malignant neoplasm of head of pancreas: Secondary | ICD-10-CM

## 2019-02-06 DIAGNOSIS — N189 Chronic kidney disease, unspecified: Secondary | ICD-10-CM | POA: Diagnosis not present

## 2019-02-06 DIAGNOSIS — Z5111 Encounter for antineoplastic chemotherapy: Secondary | ICD-10-CM | POA: Diagnosis not present

## 2019-02-06 DIAGNOSIS — I129 Hypertensive chronic kidney disease with stage 1 through stage 4 chronic kidney disease, or unspecified chronic kidney disease: Secondary | ICD-10-CM | POA: Diagnosis not present

## 2019-02-06 DIAGNOSIS — R252 Cramp and spasm: Secondary | ICD-10-CM

## 2019-02-06 DIAGNOSIS — D649 Anemia, unspecified: Secondary | ICD-10-CM

## 2019-02-06 LAB — CBC WITH DIFFERENTIAL (CANCER CENTER ONLY)
Abs Immature Granulocytes: 0.01 10*3/uL (ref 0.00–0.07)
Basophils Absolute: 0.1 10*3/uL (ref 0.0–0.1)
Basophils Relative: 1 %
Eosinophils Absolute: 0.2 10*3/uL (ref 0.0–0.5)
Eosinophils Relative: 4 %
HCT: 34.2 % — ABNORMAL LOW (ref 39.0–52.0)
Hemoglobin: 11.6 g/dL — ABNORMAL LOW (ref 13.0–17.0)
Immature Granulocytes: 0 %
Lymphocytes Relative: 33 %
Lymphs Abs: 2 10*3/uL (ref 0.7–4.0)
MCH: 31.1 pg (ref 26.0–34.0)
MCHC: 33.9 g/dL (ref 30.0–36.0)
MCV: 91.7 fL (ref 80.0–100.0)
Monocytes Absolute: 0.5 10*3/uL (ref 0.1–1.0)
Monocytes Relative: 8 %
Neutro Abs: 3.2 10*3/uL (ref 1.7–7.7)
Neutrophils Relative %: 54 %
Platelet Count: 155 10*3/uL (ref 150–400)
RBC: 3.73 MIL/uL — ABNORMAL LOW (ref 4.22–5.81)
RDW: 12.9 % (ref 11.5–15.5)
WBC Count: 6 10*3/uL (ref 4.0–10.5)
nRBC: 0 % (ref 0.0–0.2)

## 2019-02-06 MED ORDER — PALONOSETRON HCL INJECTION 0.25 MG/5ML
INTRAVENOUS | Status: AC
  Filled 2019-02-06: qty 5

## 2019-02-06 MED ORDER — DEXTROSE 5 % IV SOLN
Freq: Once | INTRAVENOUS | Status: AC
  Administered 2019-02-06: 13:00:00 via INTRAVENOUS
  Filled 2019-02-06: qty 250

## 2019-02-06 MED ORDER — ATROPINE SULFATE 1 MG/ML IJ SOLN
INTRAMUSCULAR | Status: AC
  Filled 2019-02-06: qty 1

## 2019-02-06 MED ORDER — SODIUM CHLORIDE 0.9 % IV SOLN
Freq: Once | INTRAVENOUS | Status: AC
  Administered 2019-02-06: 14:00:00 via INTRAVENOUS
  Filled 2019-02-06: qty 5

## 2019-02-06 MED ORDER — PALONOSETRON HCL INJECTION 0.25 MG/5ML
0.2500 mg | Freq: Once | INTRAVENOUS | Status: AC
  Administered 2019-02-06: 0.25 mg via INTRAVENOUS

## 2019-02-06 MED ORDER — OXALIPLATIN CHEMO INJECTION 100 MG/20ML
200.0000 mg | Freq: Once | INTRAVENOUS | Status: AC
  Administered 2019-02-06: 15:00:00 200 mg via INTRAVENOUS
  Filled 2019-02-06: qty 40

## 2019-02-06 MED ORDER — LEUCOVORIN CALCIUM INJECTION 350 MG
400.0000 mg/m2 | Freq: Once | INTRAVENOUS | Status: AC
  Administered 2019-02-06: 17:00:00 892 mg via INTRAVENOUS
  Filled 2019-02-06: qty 35

## 2019-02-06 MED ORDER — IRINOTECAN HCL CHEMO INJECTION 100 MG/5ML
150.0000 mg/m2 | Freq: Once | INTRAVENOUS | Status: AC
  Administered 2019-02-06: 17:00:00 340 mg via INTRAVENOUS
  Filled 2019-02-06: qty 5

## 2019-02-06 MED ORDER — SODIUM CHLORIDE 0.9 % IV SOLN
2400.0000 mg/m2 | INTRAVENOUS | Status: DC
  Administered 2019-02-06: 19:00:00 5350 mg via INTRAVENOUS
  Filled 2019-02-06: qty 107

## 2019-02-06 MED ORDER — ATROPINE SULFATE 1 MG/ML IJ SOLN: 0.5000 mg | Freq: Once | INTRAMUSCULAR | Status: DC | PRN

## 2019-02-06 MED ORDER — SODIUM CHLORIDE 0.9 % IV SOLN: 10.0000 mg | Freq: Once | INTRAVENOUS | Status: DC

## 2019-02-06 MED ORDER — DEXAMETHASONE SODIUM PHOSPHATE 10 MG/ML IJ SOLN
INTRAMUSCULAR | Status: AC
  Filled 2019-02-06: qty 1

## 2019-02-06 NOTE — Patient Instructions (Signed)
[facility] Discharge Instructions for Patients Receiving Chemotherapy  Today you received the following chemotherapy agents Oxaliplatin (ELOXATIN), Leucovorin, Irinotecan (CAMPTOSAR) & Flourouracil (ADRUCIL).  To help prevent nausea and vomiting after your treatment, we encourage you to take your nausea medication as prescribed.   If you develop nausea and vomiting that is not controlled by your nausea medication, call the clinic.   BELOW ARE SYMPTOMS THAT SHOULD BE REPORTED IMMEDIATELY:  *FEVER GREATER THAN 100.5 F  *CHILLS WITH OR WITHOUT FEVER  NAUSEA AND VOMITING THAT IS NOT CONTROLLED WITH YOUR NAUSEA MEDICATION  *UNUSUAL SHORTNESS OF BREATH  *UNUSUAL BRUISING OR BLEEDING  TENDERNESS IN MOUTH AND THROAT WITH OR WITHOUT PRESENCE OF ULCERS  *URINARY PROBLEMS  *BOWEL PROBLEMS  UNUSUAL RASH Items with * indicate a potential emergency and should be followed up as soon as possible.  Feel free to call the clinic should you have any questions or concerns. The clinic phone number is [phone].  Please show the [place] at check-in to the Emergency Department and triage nurse.  Oxaliplatin Injection What is this medicine? OXALIPLATIN (ox AL i PLA tin) is a chemotherapy drug. It targets fast dividing cells, like cancer cells, and causes these cells to die. This medicine is used to treat cancers of the colon and rectum, and many other cancers. This medicine may be used for other purposes; ask your health care provider or pharmacist if you have questions. COMMON BRAND NAME(S): Eloxatin What should I tell my health care provider before I take this medicine? They need to know if you have any of these conditions: -kidney disease -an unusual or allergic reaction to oxaliplatin, other chemotherapy, other medicines, foods, dyes, or preservatives -pregnant or trying to get pregnant -breast-feeding How should I use this medicine? This drug is given as an  infusion into a vein. It is administered in a hospital or clinic by a specially trained health care professional. Talk to your pediatrician regarding the use of this medicine in children. Special care may be needed. Overdosage: If you think you have taken too much of this medicine contact a poison control center or emergency room at once. NOTE: This medicine is only for you. Do not share this medicine with others. What if I miss a dose? It is important not to miss a dose. Call your doctor or health care professional if you are unable to keep an appointment. What may interact with this medicine? -medicines to increase blood counts like filgrastim, pegfilgrastim, sargramostim -probenecid -some antibiotics like amikacin, gentamicin, neomycin, polymyxin B, streptomycin, tobramycin -zalcitabine Talk to your doctor or health care professional before taking any of these medicines: -acetaminophen -aspirin -ibuprofen -ketoprofen -naproxen This list may not describe all possible interactions. Give your health care provider a list of all the medicines, herbs, non-prescription drugs, or dietary supplements you use. Also tell them if you smoke, drink alcohol, or use illegal drugs. Some items may interact with your medicine. What should I watch for while using this medicine? Your condition will be monitored carefully while you are receiving this medicine. You will need important blood work done while you are taking this medicine. This medicine can make you more sensitive to cold. Do not drink cold drinks or use ice. Cover exposed skin before coming in contact with cold temperatures or cold objects. When out in cold weather wear warm clothing and cover your mouth and nose to warm the air that goes into your lungs. Tell your doctor if you get  sensitive to the cold. This drug may make you feel generally unwell. This is not uncommon, as chemotherapy can affect healthy cells as well as cancer cells. Report any  side effects. Continue your course of treatment even though you feel ill unless your doctor tells you to stop. In some cases, you may be given additional medicines to help with side effects. Follow all directions for their use. Call your doctor or health care professional for advice if you get a fever, chills or sore throat, or other symptoms of a cold or flu. Do not treat yourself. This drug decreases your body's ability to fight infections. Try to avoid being around people who are sick. This medicine may increase your risk to bruise or bleed. Call your doctor or health care professional if you notice any unusual bleeding. Be careful brushing and flossing your teeth or using a toothpick because you may get an infection or bleed more easily. If you have any dental work done, tell your dentist you are receiving this medicine. Avoid taking products that contain aspirin, acetaminophen, ibuprofen, naproxen, or ketoprofen unless instructed by your doctor. These medicines may hide a fever. Do not become pregnant while taking this medicine. Women should inform their doctor if they wish to become pregnant or think they might be pregnant. There is a potential for serious side effects to an unborn child. Talk to your health care professional or pharmacist for more information. Do not breast-feed an infant while taking this medicine. Call your doctor or health care professional if you get diarrhea. Do not treat yourself. What side effects may I notice from receiving this medicine? Side effects that you should report to your doctor or health care professional as soon as possible: -allergic reactions like skin rash, itching or hives, swelling of the face, lips, or tongue -low blood counts - This drug may decrease the number of white blood cells, red blood cells and platelets. You may be at increased risk for infections and bleeding. -signs of infection - fever or chills, cough, sore throat, pain or difficulty passing  urine -signs of decreased platelets or bleeding - bruising, pinpoint red spots on the skin, black, tarry stools, nosebleeds -signs of decreased red blood cells - unusually weak or tired, fainting spells, lightheadedness -breathing problems -chest pain, pressure -cough -diarrhea -jaw tightness -mouth sores -nausea and vomiting -pain, swelling, redness or irritation at the injection site -pain, tingling, numbness in the hands or feet -problems with balance, talking, walking -redness, blistering, peeling or loosening of the skin, including inside the mouth -trouble passing urine or change in the amount of urine Side effects that usually do not require medical attention (report to your doctor or health care professional if they continue or are bothersome): -changes in vision -constipation -hair loss -loss of appetite -metallic taste in the mouth or changes in taste -stomach pain This list may not describe all possible side effects. Call your doctor for medical advice about side effects. You may report side effects to FDA at 1-800-FDA-1088. Where should I keep my medicine? This drug is given in a hospital or clinic and will not be stored at home. NOTE: This sheet is a summary. It may not cover all possible information. If you have questions about this medicine, talk to your doctor, pharmacist, or health care provider.  2019 Elsevier/Gold Standard (2008-05-15 17:22:47)  Leucovorin injection What is this medicine? LEUCOVORIN (loo koe VOR in) is used to prevent or treat the harmful effects of some medicines. This medicine  is used to treat anemia caused by a low amount of folic acid in the body. It is also used with 5-fluorouracil (5-FU) to treat colon cancer. This medicine may be used for other purposes; ask your health care provider or pharmacist if you have questions. What should I tell my health care provider before I take this medicine? They need to know if you have any of these  conditions: -anemia from low levels of vitamin B-12 in the blood -an unusual or allergic reaction to leucovorin, folic acid, other medicines, foods, dyes, or preservatives -pregnant or trying to get pregnant -breast-feeding How should I use this medicine? This medicine is for injection into a muscle or into a vein. It is given by a health care professional in a hospital or clinic setting. Talk to your pediatrician regarding the use of this medicine in children. Special care may be needed. Overdosage: If you think you have taken too much of this medicine contact a poison control center or emergency room at once. NOTE: This medicine is only for you. Do not share this medicine with others. What if I miss a dose? This does not apply. What may interact with this medicine? -capecitabine -fluorouracil -phenobarbital -phenytoin -primidone -trimethoprim-sulfamethoxazole This list may not describe all possible interactions. Give your health care provider a list of all the medicines, herbs, non-prescription drugs, or dietary supplements you use. Also tell them if you smoke, drink alcohol, or use illegal drugs. Some items may interact with your medicine. What should I watch for while using this medicine? Your condition will be monitored carefully while you are receiving this medicine. This medicine may increase the side effects of 5-fluorouracil, 5-FU. Tell your doctor or health care professional if you have diarrhea or mouth sores that do not get better or that get worse. What side effects may I notice from receiving this medicine? Side effects that you should report to your doctor or health care professional as soon as possible: -allergic reactions like skin rash, itching or hives, swelling of the face, lips, or tongue -breathing problems -fever, infection -mouth sores -unusual bleeding or bruising -unusually weak or tired Side effects that usually do not require medical attention (report to  your doctor or health care professional if they continue or are bothersome): -constipation or diarrhea -loss of appetite -nausea, vomiting This list may not describe all possible side effects. Call your doctor for medical advice about side effects. You may report side effects to FDA at 1-800-FDA-1088. Where should I keep my medicine? This drug is given in a hospital or clinic and will not be stored at home. NOTE: This sheet is a summary. It may not cover all possible information. If you have questions about this medicine, talk to your doctor, pharmacist, or health care provider.  2019 Elsevier/Gold Standard (2008-04-24 16:50:29)  Irinotecan injection What is this medicine? IRINOTECAN (ir in oh TEE kan ) is a chemotherapy drug. It is used to treat colon and rectal cancer. This medicine may be used for other purposes; ask your health care provider or pharmacist if you have questions. COMMON BRAND NAME(S): Camptosar What should I tell my health care provider before I take this medicine? They need to know if you have any of these conditions: -dehydration -diarrhea -infection (especially a virus infection such as chickenpox, cold sores, or herpes) -liver disease -low blood counts, like low white cell, platelet, or red cell counts -low levels of calcium, magnesium, or potassium in the blood -recent or ongoing radiation therapy -an  unusual or allergic reaction to irinotecan, other medicines, foods, dyes, or preservatives -pregnant or trying to get pregnant -breast-feeding How should I use this medicine? This drug is given as an infusion into a vein. It is administered in a hospital or clinic by a specially trained health care professional. Talk to your pediatrician regarding the use of this medicine in children. Special care may be needed. Overdosage: If you think you have taken too much of this medicine contact a poison control center or emergency room at once. NOTE: This medicine is only  for you. Do not share this medicine with others. What if I miss a dose? It is important not to miss your dose. Call your doctor or health care professional if you are unable to keep an appointment. What may interact with this medicine? This medicine may interact with the following medications: -antiviral medicines for HIV or AIDS -certain antibiotics like rifampin or rifabutin -certain medicines for fungal infections like itraconazole, ketoconazole, posaconazole, and voriconazole -certain medicines for seizures like carbamazepine, phenobarbital, phenotoin -clarithromycin -gemfibrozil -nefazodone -St. John's Wort This list may not describe all possible interactions. Give your health care provider a list of all the medicines, herbs, non-prescription drugs, or dietary supplements you use. Also tell them if you smoke, drink alcohol, or use illegal drugs. Some items may interact with your medicine. What should I watch for while using this medicine? Your condition will be monitored carefully while you are receiving this medicine. You will need important blood work done while you are taking this medicine. This drug may make you feel generally unwell. This is not uncommon, as chemotherapy can affect healthy cells as well as cancer cells. Report any side effects. Continue your course of treatment even though you feel ill unless your doctor tells you to stop. In some cases, you may be given additional medicines to help with side effects. Follow all directions for their use. You may get drowsy or dizzy. Do not drive, use machinery, or do anything that needs mental alertness until you know how this medicine affects you. Do not stand or sit up quickly, especially if you are an older patient. This reduces the risk of dizzy or fainting spells. Call your doctor or health care professional for advice if you get a fever, chills or sore throat, or other symptoms of a cold or flu. Do not treat yourself. This drug  decreases your body's ability to fight infections. Try to avoid being around people who are sick. This medicine may increase your risk to bruise or bleed. Call your doctor or health care professional if you notice any unusual bleeding. Be careful brushing and flossing your teeth or using a toothpick because you may get an infection or bleed more easily. If you have any dental work done, tell your dentist you are receiving this medicine. Avoid taking products that contain aspirin, acetaminophen, ibuprofen, naproxen, or ketoprofen unless instructed by your doctor. These medicines may hide a fever. Do not become pregnant while taking this medicine. Women should inform their doctor if they wish to become pregnant or think they might be pregnant. There is a potential for serious side effects to an unborn child. Talk to your health care professional or pharmacist for more information. Do not breast-feed an infant while taking this medicine. What side effects may I notice from receiving this medicine? Side effects that you should report to your doctor or health care professional as soon as possible: -allergic reactions like skin rash, itching or hives, swelling  of the face, lips, or tongue -chest pain -diarrhea -flushing, runny nose, sweating during infusion -low blood counts - this medicine may decrease the number of white blood cells, red blood cells and platelets. You may be at increased risk for infections and bleeding. -nausea, vomiting -pain, swelling, warmth in the leg -signs of decreased platelets or bleeding - bruising, pinpoint red spots on the skin, black, tarry stools, blood in the urine -signs of infection - fever or chills, cough, sore throat, pain or difficulty passing urine -signs of decreased red blood cells - unusually weak or tired, fainting spells, lightheadedness Side effects that usually do not require medical attention (report to your doctor or health care professional if they  continue or are bothersome): -constipation -hair loss -headache -loss of appetite -mouth sores -stomach pain This list may not describe all possible side effects. Call your doctor for medical advice about side effects. You may report side effects to FDA at 1-800-FDA-1088. Where should I keep my medicine? This drug is given in a hospital or clinic and will not be stored at home. NOTE: This sheet is a summary. It may not cover all possible information. If you have questions about this medicine, talk to your doctor, pharmacist, or health care provider.  2019 Elsevier/Gold Standard (2018-01-04 15:02:58)  Fluorouracil, 5FU; Diclofenac topical cream What is this medicine? FLUOROURACIL; DICLOFENAC (flure oh YOOR a sil; dye KLOE fen ak) is a combination of a topical chemotherapy agent and non-steroidal anti-inflammatory drug (NSAID). It is used on the skin to treat skin cancer and skin conditions that could become cancer. This medicine may be used for other purposes; ask your health care provider or pharmacist if you have questions. COMMON BRAND NAME(S): FLUORAC What should I tell my health care provider before I take this medicine? They need to know if you have any of these conditions: -bleeding problems -cigarette smoker -DPD enzyme deficiency -heart disease -high blood pressure -if you frequently drink alcohol containing drinks -kidney disease -liver disease -open or infected skin -stomach problems -swelling or open sores at the treatment site -recent or planned coronary artery bypass graft (CABG) surgery -an unusual or allergic reaction to fluorouracil, diclofenac, aspirin, other NSAIDs, other medicines, foods, dyes, or preservatives -pregnant or trying to get pregnant -breast-feeding How should I use this medicine? This medicine is only for use on the skin. Follow the directions on the prescription label. Wash hands before and after use. Wash affected area and gently pat dry. To  apply this medicine use a cotton-tipped applicator, or use gloves if applying with fingertips. If applied with unprotected fingertips, it is very important to wash your hands well after you apply this medicine. Avoid applying to the eyes, nose, or mouth. Apply enough medicine to cover the affected area. You can cover the area with a light gauze dressing, but do not use tight or air-tight dressings. Finish the full course prescribed by your doctor or health care professional, even if you think your condition is better. Do not stop taking except on the advice of your doctor or health care professional. Talk to your pediatrician regarding the use of this medicine in children. Special care may be needed. Overdosage: If you think you have taken too much of this medicine contact a poison control center or emergency room at once. NOTE: This medicine is only for you. Do not share this medicine with others. What if I miss a dose? If you miss a dose, apply it as soon as you can. If  it is almost time for your next dose, only use that dose. Do not apply extra doses. Contact your doctor or health care professional if you miss more than one dose. What may interact with this medicine? Interactions are not expected. Do not use any other skin products without telling your doctor or health care professional. This list may not describe all possible interactions. Give your health care provider a list of all the medicines, herbs, non-prescription drugs, or dietary supplements you use. Also tell them if you smoke, drink alcohol, or use illegal drugs. Some items may interact with your medicine. What should I watch for while using this medicine? Visit your doctor or health care professional for checks on your progress. You will need to use this medicine for 2 to 6 weeks. This may be longer depending on the condition being treated. You may not see full healing for another 1 to 2 months after you stop using the medicine. Treated  areas of skin can look unsightly during and for several weeks after treatment with this medicine. This medicine can make you more sensitive to the sun. Keep out of the sun. If you cannot avoid being in the sun, wear protective clothing and use sunscreen. Do not use sun lamps or tanning beds/booths. If a pet comes in contact with the area where this medicine was applied to your skin or if it is ingested, they may have a serious risk of side effects. If accidental contact happens, the skin of the pet should be washed right away with soap and water. Contact your vet right away if your pet becomes exposed. Do not become pregnant while taking this medicine. Women should inform their doctor if they wish to become pregnant or think they might be pregnant. There is a potential for serious side effects to an unborn child. Talk to your health care professional or pharmacist for more information. What side effects may I notice from receiving this medicine? Side effects that you should report to your doctor or health care professional as soon as possible: -allergic reactions like skin rash, itching or hives, swelling of the face, lips, or tongue -black or bloody stools, blood in the urine or vomit -blurred vision -chest pain -difficulty breathing or wheezing -redness, blistering, peeling or loosening of the skin, including inside the mouth -severe redness and swelling of normal skin -slurred speech or weakness on one side of the body -trouble passing urine or change in the amount of urine -unexplained weight gain or swelling -unusually weak or tired -yellowing of eyes or skin Side effects that usually do not require medical attention (report to your doctor or health care professional if they continue or are bothersome): -increased sensitivity of the skin to sun and ultraviolet light -pain and burning of the affected area -scaling or swelling of the affected area -skin rash, itching of the affected  area -tenderness This list may not describe all possible side effects. Call your doctor for medical advice about side effects. You may report side effects to FDA at 1-800-FDA-1088. Where should I keep my medicine? Keep out of the reach of children and pets. Store at room temperature between 20 and 25 degrees C (68 and 77 degrees F). Throw away any unused medicine after the expiration date. NOTE: This sheet is a summary. It may not cover all possible information. If you have questions about this medicine, talk to your doctor, pharmacist, or health care provider.  2019 Elsevier/Gold Standard (2015-11-29 17:35:08)  Coronavirus (COVID-19) Are you  at risk?  Are you at risk for the Coronavirus (COVID-19)?  To be considered HIGH RISK for Coronavirus (COVID-19), you have to meet the following criteria:  . Traveled to [place], [place], [place], [place] or [place]; or in the [place] to [place], [place], [place], or [place]; and have fever, cough, and shortness of breath within the last 2 weeks of travel OR . Been in close contact with a person diagnosed with COVID-19 within the last 2 weeks and have fever, cough, and shortness of breath . IF YOU DO NOT MEET THESE CRITERIA, YOU ARE CONSIDERED LOW RISK FOR COVID-19.  What to do if you are HIGH RISK for COVID-19?  Marland Kitchen If you are having a medical emergency, call 911. . Seek medical care right away. Before you go to a doctor's office, urgent care or emergency department, call ahead and tell them about your recent travel, contact with someone diagnosed with COVID-19, and your symptoms. You should receive instructions from your physician's office regarding next steps of care.  . When you arrive at healthcare provider, tell the healthcare staff immediately you have returned from visiting [place], [place], [place], [place] or [place]; or traveled in the [place] to [place], [place], [place], or [place]; in the last two weeks or you have  been in close contact with a person diagnosed with COVID-19 in the last 2 weeks.   . Tell the health care staff about your symptoms: fever, cough and shortness of breath. . After you have been seen by a medical provider, you will be either: o Tested for (COVID-19) and discharged home on quarantine except to seek medical care if symptoms worsen, and asked to  - Stay home and avoid contact with others until you get your results (4-5 days)  - Avoid travel on public transportation if possible (such as bus, train, or airplane) or o Sent to the Emergency Department by EMS for evaluation, COVID-19 testing, and possible admission depending on your condition and test results.  What to do if you are LOW RISK for COVID-19?  Reduce your risk of any infection by using the same precautions used for avoiding the common cold or flu:  Marland Kitchen Wash your hands often with soap and warm water for at least 20 seconds.  If soap and water are not readily available, use an alcohol-based hand sanitizer with at least 60% alcohol.  . If coughing or sneezing, cover your mouth and nose by coughing or sneezing into the elbow areas of your shirt or coat, into a tissue or into your sleeve (not your hands). . Avoid shaking hands with others and consider head nods or verbal greetings only. . Avoid touching your eyes, nose, or mouth with unwashed hands.  . Avoid close contact with people who are sick. . Avoid places or events with large numbers of people in one location, like concerts or sporting events. . Carefully consider travel plans you have or are making. . If you are planning any travel outside or inside the [place], visit the CDC's Travelers' Health webpage for the latest health notices. . If you have some symptoms but not all symptoms, continue to monitor at home and seek medical attention if your symptoms worsen. . If you are having a medical emergency, call 911.   Ricardo / e-Visit: [email or web address]         [facility]: [facility]: [phone]  [facility]: [phone]

## 2019-02-06 NOTE — Telephone Encounter (Signed)
Scheduled appt per 4/6 los 

## 2019-02-06 NOTE — Telephone Encounter (Signed)
Called patient during treatment in infusion room to offer support and to let him know that I will be touching base with him in a few days. I encouraged him to reach out for questions or concerns.

## 2019-02-06 NOTE — Progress Notes (Addendum)
[facility] OFFICE PROGRESS NOTE   Diagnosis: Pancreas cancer  INTERVAL HISTORY:   Brian Torres returns as scheduled.  He underwent placement of a Port-A-Cath on 02/02/2019.  He reports a good appetite.  He denies pain.  He felt uncomfortable last night after eating a large meal.  Better today.  Bowels are moving.  He denies bleeding.  No fever, cough, shortness of breath.  Objective:  Vital signs in last 24 hours:  Blood pressure 137/80, pulse 74, temperature 97.9 F (36.6 C), temperature source Oral, resp. rate 18, height 6\' 4"  (1.93 m), weight 201 lb 12.8 oz (91.5 kg), SpO2 100 %.    HEENT: No thrush or ulcers.  No scleral icterus. Resp: Respirations even and unlabored. Vascular: Compression stocking on the left leg.  Left lower leg appears larger than the right lower leg. Neuro: Alert and oriented. Skin: Palms without erythema. Port-A-Cath without erythema.   Lab Results:  Lab Results  Component Value Date   WBC 6.0 02/06/2019   HGB 11.6 (L) 02/06/2019   HCT 34.2 (L) 02/06/2019   MCV 91.7 02/06/2019   PLT 155 02/06/2019   NEUTROABS 3.2 02/06/2019    Imaging:  No results found.  Medications: I have reviewed the patient's current medications.  Assessment/Plan: 1. Pancreas head mass  Presented with jaundice, bilirubin elevated at 21.4  CTs abdomen/pelvis 12/20/2018-severe intrahepatic and extrahepatic biliary dilatation due to a possible 3.5 x 2.5 cm solid mass in the pancreatic head.  Pancreatic ductal dilatation noted as well with multiple cystic lesions in the pancreatic tail and body.    CT chest 12/20/2018-occasional nonspecific small pulmonary nodules felt to likely be related to prior infection or inflammation.    ERCP by Dr. Watt Climes 12/21/2018.  The major papilla was on the rim of a diverticulum.  The minor papilla appeared to be bulging.  A biopsy of the duodenum adjacent to the minor papilla was performed.  Plastic stent was placed into the  ventral pancreatic duct.  Biliary sphincterotomy performed.  Covered metal stent placed into the common bile duct.  Bile duct brushings showed benign reactive/reparative changes.  Biopsy of polypoid duodenal mucosa suggestive of nodular peptic duodenitis, negative for dysplasia or malignancy.   Upper EUS by Dr. Paulita Fujita on 01/03/2019 showed a few cystic lesions in the pancreatic body and pancreatic tail.  A stent was visualized in the common bile duct.  Mass identified in the pancreatic head with fine-needle aspiration performed.  Lesion appeared to abut or potentially superficially invade the superior mesenteric vein.  No lymphadenopathy.  No involvement of the superior mesenteric artery or celiac artery.  Pathology returned suspicious for malignancy.  Dr. Paulita Fujita notes if pathology shows adenocarcinoma the lesion would be staged T3 N0 MX.  Repeat ERCP and placement of a metal bile duct stent 01/24/2019  EUS 01/24/2019, T3N0 pancreas head mass with abutment of the SMV, FNA of the pancreas mass revealed adenocarcinoma, cytology from the bile duct stent revealed adenocarcinoma   Cycle 1 FOLFIRINOX 02/06/2019 2. Biliary obstruction status post stent placement 3. History of left lower extremity DVT 2017, 2018-on Eliquis. 4. Type 2 diabetes 5. Hypertension 6. Chronic kidney disease 7. Tobacco use 8. Anemia, macrocytic.  Transfused 2 units of blood 12/20/2018.  On oral iron.  Improved 02/01/2019 and 02/06/2019. 9. Port-A-Cath placement 02/02/2019, Dr. Barry Dienes   Disposition: Brian Torres appears stable.  Plan to proceed with cycle 1 neoadjuvant FOLFIRINOX today as scheduled.  We again reviewed potential toxicities.  He agrees to  proceed.  We confirmed he has EMLA cream, Zofran, Compazine, Imodium at home.  He understands to contact the office if he has nausea and/or diarrhea not controlled with these medications.  We reviewed the CBC from today.  Counts are adequate for treatment.  Hemoglobin continues to be improved.   He will return for lab, follow-up and cycle 2 FOLFIRINOX in 2 weeks.  He will contact the office in the interim as outlined above or with any other problems.  Patient seen with Dr. Benay Spice.    [name] ANP/GNP-BC   02/06/2019  12:02 PM  This was a shared visit with [name].  The hemoglobin has improved over the past few weeks.  The etiology of the severe anemia remains unclear.  Review of the peripheral blood smear last week was unremarkable.  The plan is to begin FOLFIRINOX today.  Julieanne Manson, MD

## 2019-02-06 NOTE — Progress Notes (Signed)
Towards end of patient's infusion, patient c/o tightness in the jaw.  Vitals were stable. Sandi Mealy, PA assessed and believed it may be due to the cold sensitivity of the Oxaliplatin.  Lucianne Lei gave direction to restart the infusion. No further complaints from the patient at this time

## 2019-02-08 ENCOUNTER — Other Ambulatory Visit: Payer: Self-pay

## 2019-02-08 ENCOUNTER — Inpatient Hospital Stay: Payer: Managed Care, Other (non HMO)

## 2019-02-08 VITALS — BP 128/70 | Temp 98.6°F | Resp 18

## 2019-02-08 DIAGNOSIS — Z5111 Encounter for antineoplastic chemotherapy: Secondary | ICD-10-CM | POA: Diagnosis not present

## 2019-02-08 DIAGNOSIS — C250 Malignant neoplasm of head of pancreas: Secondary | ICD-10-CM

## 2019-02-08 MED ORDER — HEPARIN SOD (PORK) LOCK FLUSH 100 UNIT/ML IV SOLN
500.0000 [IU] | Freq: Once | INTRAVENOUS | Status: AC | PRN
  Administered 2019-02-08: 500 [IU]
  Filled 2019-02-08: qty 5

## 2019-02-08 MED ORDER — SODIUM CHLORIDE 0.9% FLUSH
10.0000 mL | INTRAVENOUS | Status: DC | PRN
  Administered 2019-02-08: 17:00:00 10 mL
  Filled 2019-02-08: qty 10

## 2019-02-08 NOTE — Progress Notes (Signed)
The patient was seen in the infusion room as he was receiving FOLFIRI today.  He was receiving irinotecan and leucovorin at the time.  The patient had received oxalic platinum and was reporting some "jaw fatigue" after getting up to go to the bathroom.  His vital signs were stable with his blood pressure at 151/83, pulse 77, oxygen saturation 100% on room air and temp 98.4.  His infusion was continued with the patient being given a warm blanket to wrap around his face.  His symptoms did not worsen.  He was able to complete his infusion.  Sandi Mealy, MHS, PA-C Physician Assistant

## 2019-02-19 ENCOUNTER — Other Ambulatory Visit: Payer: Self-pay | Admitting: Oncology

## 2019-02-20 ENCOUNTER — Inpatient Hospital Stay: Payer: Managed Care, Other (non HMO)

## 2019-02-20 ENCOUNTER — Encounter: Payer: Self-pay | Admitting: Nurse Practitioner

## 2019-02-20 ENCOUNTER — Inpatient Hospital Stay (HOSPITAL_BASED_OUTPATIENT_CLINIC_OR_DEPARTMENT_OTHER): Payer: Managed Care, Other (non HMO) | Admitting: Nurse Practitioner

## 2019-02-20 ENCOUNTER — Other Ambulatory Visit: Payer: Self-pay

## 2019-02-20 VITALS — BP 168/74 | HR 88 | Temp 98.2°F | Resp 18 | Ht 76.0 in | Wt 203.6 lb

## 2019-02-20 VITALS — BP 153/82 | HR 71 | Temp 98.2°F | Resp 16

## 2019-02-20 DIAGNOSIS — Z95828 Presence of other vascular implants and grafts: Secondary | ICD-10-CM

## 2019-02-20 DIAGNOSIS — N189 Chronic kidney disease, unspecified: Secondary | ICD-10-CM

## 2019-02-20 DIAGNOSIS — D709 Neutropenia, unspecified: Secondary | ICD-10-CM

## 2019-02-20 DIAGNOSIS — E1122 Type 2 diabetes mellitus with diabetic chronic kidney disease: Secondary | ICD-10-CM | POA: Diagnosis not present

## 2019-02-20 DIAGNOSIS — Z5111 Encounter for antineoplastic chemotherapy: Secondary | ICD-10-CM | POA: Diagnosis not present

## 2019-02-20 DIAGNOSIS — I129 Hypertensive chronic kidney disease with stage 1 through stage 4 chronic kidney disease, or unspecified chronic kidney disease: Secondary | ICD-10-CM

## 2019-02-20 DIAGNOSIS — D649 Anemia, unspecified: Secondary | ICD-10-CM

## 2019-02-20 DIAGNOSIS — C250 Malignant neoplasm of head of pancreas: Secondary | ICD-10-CM

## 2019-02-20 DIAGNOSIS — Z86718 Personal history of other venous thrombosis and embolism: Secondary | ICD-10-CM

## 2019-02-20 DIAGNOSIS — Z7901 Long term (current) use of anticoagulants: Secondary | ICD-10-CM

## 2019-02-20 LAB — CMP (CANCER CENTER ONLY)
ALT: 25 U/L (ref 0–44)
AST: 14 U/L — ABNORMAL LOW (ref 15–41)
Albumin: 3.3 g/dL — ABNORMAL LOW (ref 3.5–5.0)
Alkaline Phosphatase: 104 U/L (ref 38–126)
Anion gap: 11 (ref 5–15)
BUN: 16 mg/dL (ref 6–20)
CO2: 21 mmol/L — ABNORMAL LOW (ref 22–32)
Calcium: 8.8 mg/dL — ABNORMAL LOW (ref 8.9–10.3)
Chloride: 103 mmol/L (ref 98–111)
Creatinine: 1.15 mg/dL (ref 0.61–1.24)
GFR, Est AFR Am: >60 mL/min (ref >60)
GFR, Estimated: >60 mL/min (ref >60)
Glucose, Bld: 276 mg/dL — ABNORMAL HIGH (ref 70–99)
Potassium: 4.7 mmol/L (ref 3.5–5.1)
Sodium: 135 mmol/L (ref 135–145)
Total Bilirubin: 0.3 mg/dL (ref 0.3–1.2)
Total Protein: 6.7 g/dL (ref 6.5–8.1)

## 2019-02-20 LAB — CBC WITH DIFFERENTIAL (CANCER CENTER ONLY)
Abs Immature Granulocytes: 0.01 10*3/uL (ref 0.00–0.07)
Basophils Absolute: 0 10*3/uL (ref 0.0–0.1)
Basophils Relative: 1 %
Eosinophils Absolute: 0.2 10*3/uL (ref 0.0–0.5)
Eosinophils Relative: 5 %
HCT: 33.8 % — ABNORMAL LOW (ref 39.0–52.0)
Hemoglobin: 11.5 g/dL — ABNORMAL LOW (ref 13.0–17.0)
Immature Granulocytes: 0 %
Lymphocytes Relative: 50 %
Lymphs Abs: 1.7 10*3/uL (ref 0.7–4.0)
MCH: 30.4 pg (ref 26.0–34.0)
MCHC: 34 g/dL (ref 30.0–36.0)
MCV: 89.4 fL (ref 80.0–100.0)
Monocytes Absolute: 0.4 10*3/uL (ref 0.1–1.0)
Monocytes Relative: 13 %
Neutro Abs: 1.1 10*3/uL — ABNORMAL LOW (ref 1.7–7.7)
Neutrophils Relative %: 31 %
Platelet Count: 133 10*3/uL — ABNORMAL LOW (ref 150–400)
RBC: 3.78 MIL/uL — ABNORMAL LOW (ref 4.22–5.81)
RDW: 12.8 % (ref 11.5–15.5)
WBC Count: 3.5 10*3/uL — ABNORMAL LOW (ref 4.0–10.5)
nRBC: 0 % (ref 0.0–0.2)

## 2019-02-20 MED ORDER — DEXTROSE 5 % IV SOLN
Freq: Once | INTRAVENOUS | Status: AC
  Administered 2019-02-20: 12:00:00 via INTRAVENOUS
  Filled 2019-02-20: qty 250

## 2019-02-20 MED ORDER — SODIUM CHLORIDE 0.9 % IV SOLN
Freq: Once | INTRAVENOUS | Status: AC
  Administered 2019-02-20: 13:00:00 via INTRAVENOUS
  Filled 2019-02-20: qty 5

## 2019-02-20 MED ORDER — SODIUM CHLORIDE 0.9% FLUSH
10.0000 mL | INTRAVENOUS | Status: DC | PRN
  Administered 2019-02-20: 10:00:00 10 mL
  Filled 2019-02-20: qty 10

## 2019-02-20 MED ORDER — LEUCOVORIN CALCIUM INJECTION 350 MG
400.0000 mg/m2 | Freq: Once | INTRAVENOUS | Status: AC
  Administered 2019-02-20: 892 mg via INTRAVENOUS
  Filled 2019-02-20: qty 17.5

## 2019-02-20 MED ORDER — PALONOSETRON HCL INJECTION 0.25 MG/5ML
INTRAVENOUS | Status: AC
  Filled 2019-02-20: qty 5

## 2019-02-20 MED ORDER — SODIUM CHLORIDE 0.9 % IV SOLN
2400.0000 mg/m2 | INTRAVENOUS | Status: DC
  Administered 2019-02-20: 5350 mg via INTRAVENOUS
  Filled 2019-02-20: qty 107

## 2019-02-20 MED ORDER — IRINOTECAN HCL CHEMO INJECTION 100 MG/5ML
150.0000 mg/m2 | Freq: Once | INTRAVENOUS | Status: AC
  Administered 2019-02-20: 340 mg via INTRAVENOUS
  Filled 2019-02-20: qty 15

## 2019-02-20 MED ORDER — PALONOSETRON HCL INJECTION 0.25 MG/5ML
0.2500 mg | Freq: Once | INTRAVENOUS | Status: AC
  Administered 2019-02-20: 0.25 mg via INTRAVENOUS

## 2019-02-20 MED ORDER — OXALIPLATIN CHEMO INJECTION 100 MG/20ML
89.0000 mg/m2 | Freq: Once | INTRAVENOUS | Status: AC
  Administered 2019-02-20: 200 mg via INTRAVENOUS
  Filled 2019-02-20: qty 40

## 2019-02-20 NOTE — Progress Notes (Signed)
[name] aware of pts mild muscle cramping/jaw discomfort during last cycle.  Likely related to Oxaliplatin. MD ok to proceed w/ tx today as planned.  No additional premeds or changes necessary.  Will monitor. Kennith Center, Pharm.D., CPP 02/20/2019@1 :04 PM

## 2019-02-20 NOTE — Progress Notes (Signed)
During infusion of Irinotecan and Leucovorin at 1531 patient with cramping to jaw.  Patient denies chest pain, shortness of breath.  VS obtained per MAR.  Sandi Mealy, PA-C notified. Pt with history of this side effected related to Oxaliplatin.  Patient is in no distress, voiced he knows this is going to happen.  Warm towel provided to site.  Will continue to monitor and assess.

## 2019-02-20 NOTE — Progress Notes (Addendum)
[facility] OFFICE PROGRESS NOTE   Diagnosis: Pancreas cancer  INTERVAL HISTORY:   Mr. Botero returns as scheduled.  He completed cycle 1 FOLFIRINOX 02/06/2019.  He had mild intermittent nausea.  He had a few "tender" spots in his mouth.  He has been able to eat and drink without difficulty.  No diarrhea though he has noted softer stools.  He is not sure how many days the cold sensitivity lasted.  He denies abdominal pain.  Objective:  Vital signs in last 24 hours:  Blood pressure (!) 168/74, pulse 88, temperature 98.2 F (36.8 C), temperature source Oral, resp. rate 18, height 6\' 4"  (1.93 m), weight 203 lb 9.6 oz (92.4 kg), SpO2 100 %.    HEENT: No thrush or ulcers. Resp: Respirations even and unlabored. Vascular: Trace edema left leg below the knee. Neuro: Alert and oriented. Skin: Palms without erythema. Port-A-Cath without erythema.   Lab Results:  Lab Results  Component Value Date   WBC 3.5 (L) 02/20/2019   HGB 11.5 (L) 02/20/2019   HCT 33.8 (L) 02/20/2019   MCV 89.4 02/20/2019   PLT 133 (L) 02/20/2019   NEUTROABS 1.1 (L) 02/20/2019    Imaging:  No results found.  Medications: I have reviewed the patient's current medications.  Assessment/Plan: 1. Pancreas head mass  Presented with jaundice, bilirubin elevated at 21.4  CTs abdomen/pelvis 12/20/2018-severe intrahepatic and extrahepatic biliary dilatation due to a possible 3.5 x 2.5 cm solid mass in the pancreatic head. Pancreatic ductal dilatation noted as well with multiple cystic lesions in the pancreatic tail and body.   CT chest 12/20/2018-occasional nonspecific small pulmonary nodules felt to likely be related to prior infection or inflammation.   ERCP by Dr. Watt Climes 12/21/2018. The major papilla was on the rim of a diverticulum. The minor papilla appeared to be bulging. A biopsy of the duodenum adjacent to the minor papilla was performed. Plastic stent was placed into the ventral  pancreatic duct. Biliary sphincterotomy performed. Covered metal stent placed into the common bile duct. Bile duct brushings showed benign reactive/reparative changes. Biopsy of polypoid duodenal mucosa suggestive of nodular peptic duodenitis, negative for dysplasia or malignancy.   Upper EUS by Dr. Paulita Fujita on 01/03/2019 showed a few cystic lesions in the pancreatic body and pancreatic tail. A stent was visualized in the common bile duct. Mass identified in the pancreatic head with fine-needle aspiration performed. Lesion appeared to abut or potentially superficially invade the superior mesenteric vein. No lymphadenopathy. No involvement of the superior mesenteric artery or celiac artery. Pathology returned suspicious for malignancy. Dr. Paulita Fujita notes if pathology shows adenocarcinoma the lesion would be staged T3 N0 MX.  Repeat ERCP and placement of a metal bile duct stent 01/24/2019  EUS 01/24/2019, T3N0 pancreas head mass with abutment of the SMV, FNA of the pancreas mass revealed adenocarcinoma, cytology from the bile duct stent revealed adenocarcinoma   Cycle 1 FOLFIRINOX 02/06/2019  Cycle 2 FOLFIRINOX 02/20/2019, Udenyca added  2. Biliary obstruction status post stent placement 3. History of left lower extremity DVT 2017, 2018-on Eliquis. 4. Type 2 diabetes 5. Hypertension 6. Chronic kidney disease 7. Tobacco use 8. Anemia, macrocytic. Transfused 2 units of blood 12/20/2018.  On oral iron.  Improved 02/01/2019 and 02/06/2019. 9. Port-A-Cath placement 02/02/2019, Dr. Barry Dienes  Disposition: Mr. Eisenberg appears stable.  He has completed 1 cycle of FOLFIRINOX.  Plan to proceed with cycle 2 today as scheduled.  He will receive Udenyca on the day of pump discontinuation due to mild  neutropenia.  We reviewed potential toxicities associated with Udenyca including bone pain, rash, splenic rupture.  He agrees to proceed.  We reviewed neutropenic precautions.  He understands to contact the office with fever,  chills, other signs of infection.  He will return for lab, follow-up and cycle 3 FOLFIRINOX in 2 weeks.  He will contact the office in the interim as outlined above or with any problems.   Patient seen with Dr. Benay Spice.    [name] ANP/GNP-BC   02/20/2019  10:15 AM This was a shared visit with [name].  Mr. Guzzo tolerated first cycle of chemotherapy well.  He has mild neutropenia.  We discussed the risk of proceeding with chemotherapy.  He would like to continue treatment on schedule.  He is receiving therapy that may be curative.  He understands the risk of infection.  He agrees to treatment today with G-CSF support.  Julieanne Manson, MD

## 2019-02-20 NOTE — Patient Instructions (Signed)
[facility] Discharge Instructions for Patients Receiving Chemotherapy  Today you received the following chemotherapy agents Oxaliplatin (ELOXATIN), Leucovorin, Irinotecan (CAMPTOSAR) & Flourouracil (ADRUCIL).  To help prevent nausea and vomiting after your treatment, we encourage you to take your nausea medication as prescribed.   If you develop nausea and vomiting that is not controlled by your nausea medication, call the clinic.   BELOW ARE SYMPTOMS THAT SHOULD BE REPORTED IMMEDIATELY:  *FEVER GREATER THAN 100.5 F  *CHILLS WITH OR WITHOUT FEVER  NAUSEA AND VOMITING THAT IS NOT CONTROLLED WITH YOUR NAUSEA MEDICATION  *UNUSUAL SHORTNESS OF BREATH  *UNUSUAL BRUISING OR BLEEDING  TENDERNESS IN MOUTH AND THROAT WITH OR WITHOUT PRESENCE OF ULCERS  *URINARY PROBLEMS  *BOWEL PROBLEMS  UNUSUAL RASH Items with * indicate a potential emergency and should be followed up as soon as possible.  Feel free to call the clinic should you have any questions or concerns. The clinic phone number is [phone].  Please show the CHEMO ALERT CARD at check-in to the Emergency Department and triage nurse.   

## 2019-02-21 ENCOUNTER — Telehealth: Payer: Self-pay | Admitting: Nurse Practitioner

## 2019-02-21 NOTE — Telephone Encounter (Signed)
Scheduled appt per 4/20 los.  Added treatment in the book for approval.

## 2019-02-22 ENCOUNTER — Inpatient Hospital Stay: Payer: Managed Care, Other (non HMO)

## 2019-02-22 ENCOUNTER — Other Ambulatory Visit: Payer: Self-pay

## 2019-02-22 VITALS — BP 134/76 | HR 90 | Temp 98.0°F | Resp 16

## 2019-02-22 DIAGNOSIS — C250 Malignant neoplasm of head of pancreas: Secondary | ICD-10-CM

## 2019-02-22 DIAGNOSIS — Z5111 Encounter for antineoplastic chemotherapy: Secondary | ICD-10-CM | POA: Diagnosis not present

## 2019-02-22 MED ORDER — PEGFILGRASTIM-CBQV 6 MG/0.6ML ~~LOC~~ SOSY
6.0000 mg | PREFILLED_SYRINGE | Freq: Once | SUBCUTANEOUS | Status: AC
  Administered 2019-02-22: 6 mg via SUBCUTANEOUS

## 2019-02-22 MED ORDER — HEPARIN SOD (PORK) LOCK FLUSH 100 UNIT/ML IV SOLN
500.0000 [IU] | Freq: Once | INTRAVENOUS | Status: AC | PRN
  Administered 2019-02-22: 500 [IU]
  Filled 2019-02-22: qty 5

## 2019-02-22 MED ORDER — PEGFILGRASTIM-CBQV 6 MG/0.6ML ~~LOC~~ SOSY
PREFILLED_SYRINGE | SUBCUTANEOUS | Status: AC
  Filled 2019-02-22: qty 0.6

## 2019-02-22 MED ORDER — SODIUM CHLORIDE 0.9% FLUSH
10.0000 mL | INTRAVENOUS | Status: DC | PRN
  Administered 2019-02-22: 10 mL
  Filled 2019-02-22: qty 10

## 2019-02-22 NOTE — Patient Instructions (Signed)
Pegfilgrastim injection  What is this medicine?  PEGFILGRASTIM (PEG fil gra stim) is a long-acting granulocyte colony-stimulating factor that stimulates the growth of neutrophils, a type of white blood cell important in the body's fight against infection. It is used to reduce the incidence of fever and infection in patients with certain types of cancer who are receiving chemotherapy that affects the bone marrow, and to increase survival after being exposed to high doses of radiation.  This medicine may be used for other purposes; ask your health care provider or pharmacist if you have questions.  COMMON BRAND NAME(S): Fulphila, Neulasta, UDENYCA  What should I tell my health care provider before I take this medicine?  They need to know if you have any of these conditions:  -kidney disease  -latex allergy  -ongoing radiation therapy  -sickle cell disease  -skin reactions to acrylic adhesives (On-Body Injector only)  -an unusual or allergic reaction to pegfilgrastim, filgrastim, other medicines, foods, dyes, or preservatives  -pregnant or trying to get pregnant  -breast-feeding  How should I use this medicine?  This medicine is for injection under the skin. If you get this medicine at home, you will be taught how to prepare and give the pre-filled syringe or how to use the On-body Injector. Refer to the patient Instructions for Use for detailed instructions. Use exactly as directed. Tell your healthcare provider immediately if you suspect that the On-body Injector may not have performed as intended or if you suspect the use of the On-body Injector resulted in a missed or partial dose.  It is important that you put your used needles and syringes in a special sharps container. Do not put them in a trash can. If you do not have a sharps container, call your pharmacist or healthcare provider to get one.  Talk to your pediatrician regarding the use of this medicine in children. While this drug may be prescribed for  selected conditions, precautions do apply.  Overdosage: If you think you have taken too much of this medicine contact a poison control center or emergency room at once.  NOTE: This medicine is only for you. Do not share this medicine with others.  What if I miss a dose?  It is important not to miss your dose. Call your doctor or health care professional if you miss your dose. If you miss a dose due to an On-body Injector failure or leakage, a new dose should be administered as soon as possible using a single prefilled syringe for manual use.  What may interact with this medicine?  Interactions have not been studied.  Give your health care provider a list of all the medicines, herbs, non-prescription drugs, or dietary supplements you use. Also tell them if you smoke, drink alcohol, or use illegal drugs. Some items may interact with your medicine.  This list may not describe all possible interactions. Give your health care provider a list of all the medicines, herbs, non-prescription drugs, or dietary supplements you use. Also tell them if you smoke, drink alcohol, or use illegal drugs. Some items may interact with your medicine.  What should I watch for while using this medicine?  You may need blood work done while you are taking this medicine.  If you are going to need a MRI, CT scan, or other procedure, tell your doctor that you are using this medicine (On-Body Injector only).  What side effects may I notice from receiving this medicine?  Side effects that you should report to   your doctor or health care professional as soon as possible:  -allergic reactions like skin rash, itching or hives, swelling of the face, lips, or tongue  -back pain  -dizziness  -fever  -pain, redness, or irritation at site where injected  -pinpoint red spots on the skin  -red or dark-brown urine  -shortness of breath or breathing problems  -stomach or side pain, or pain at the shoulder  -swelling  -tiredness  -trouble passing urine or  change in the amount of urine  Side effects that usually do not require medical attention (report to your doctor or health care professional if they continue or are bothersome):  -bone pain  -muscle pain  This list may not describe all possible side effects. Call your doctor for medical advice about side effects. You may report side effects to FDA at 1-800-FDA-1088.  Where should I keep my medicine?  Keep out of the reach of children.  If you are using this medicine at home, you will be instructed on how to store it. Throw away any unused medicine after the expiration date on the label.  NOTE: This sheet is a summary. It may not cover all possible information. If you have questions about this medicine, talk to your doctor, pharmacist, or health care provider.   2019 Elsevier/Gold Standard (2018-01-24 16:57:08)

## 2019-02-23 ENCOUNTER — Telehealth: Payer: Self-pay | Admitting: Oncology

## 2019-02-23 ENCOUNTER — Encounter: Payer: Self-pay | Admitting: Nurse Practitioner

## 2019-02-23 ENCOUNTER — Telehealth: Payer: Self-pay

## 2019-02-23 NOTE — Telephone Encounter (Signed)
Returned TC to pt regarding injection appointment on the 29th . Let him know that the appointment on the 29th is cancelled. No further problems or concerns at this time.

## 2019-02-23 NOTE — Telephone Encounter (Signed)
Per sch msg. Cancelled appt on 4/27. Called and notified patient. Patient wants to know about 4/29 appt. Advised will check with MD. Patient will check his my chart for cancellation if needed

## 2019-02-27 ENCOUNTER — Other Ambulatory Visit: Payer: Managed Care, Other (non HMO)

## 2019-02-27 ENCOUNTER — Telehealth: Payer: Self-pay | Admitting: *Deleted

## 2019-02-27 ENCOUNTER — Ambulatory Visit: Payer: Managed Care, Other (non HMO)

## 2019-02-27 ENCOUNTER — Ambulatory Visit: Payer: Managed Care, Other (non HMO) | Admitting: Nurse Practitioner

## 2019-02-27 MED ORDER — DEXAMETHASONE 4 MG PO TABS: 4.0000 mg | ORAL_TABLET | Freq: Two times a day (BID) | ORAL | 0 refills | Status: DC

## 2019-02-27 MED ORDER — MAGIC MOUTHWASH: 5.0000 mL | Freq: Four times a day (QID) | ORAL | 0 refills | Status: DC | PRN

## 2019-02-27 NOTE — Telephone Encounter (Addendum)
Called to confirm his injection appointment for 03/01/19 is canceled? Still in Mychart. Confirmed with him it is canceled and removed from system. Reports that his nausea was worse with this treatment and persists today despite Compazine 10 mg every 6 hours and Zofran 8 mg every 8 hours. Asking if anything else can be ordered for this. Will check with MD and informed patient may be able to take low dose steriod for few days after next treatment. He did received Aloxi and Emend on day of treatment. Also reports tender mouth sores and asking if he can do anything besides salt water rinses? Per Dr. Benay Spice: Call in MMW and Decadron 4 mg bid X 2 days. Call if glucose over 350. Patient notified.

## 2019-03-01 ENCOUNTER — Ambulatory Visit: Payer: Managed Care, Other (non HMO)

## 2019-03-05 ENCOUNTER — Other Ambulatory Visit: Payer: Self-pay | Admitting: Oncology

## 2019-03-06 ENCOUNTER — Telehealth: Payer: Self-pay

## 2019-03-06 ENCOUNTER — Inpatient Hospital Stay: Payer: Managed Care, Other (non HMO) | Attending: Oncology

## 2019-03-06 ENCOUNTER — Encounter: Payer: Self-pay | Admitting: Nurse Practitioner

## 2019-03-06 ENCOUNTER — Inpatient Hospital Stay (HOSPITAL_BASED_OUTPATIENT_CLINIC_OR_DEPARTMENT_OTHER): Payer: Managed Care, Other (non HMO) | Admitting: Nurse Practitioner

## 2019-03-06 ENCOUNTER — Inpatient Hospital Stay: Payer: Managed Care, Other (non HMO)

## 2019-03-06 ENCOUNTER — Other Ambulatory Visit: Payer: Self-pay

## 2019-03-06 VITALS — BP 117/73 | HR 104 | Temp 98.0°F | Resp 18 | Ht 76.0 in | Wt 197.4 lb

## 2019-03-06 DIAGNOSIS — F17210 Nicotine dependence, cigarettes, uncomplicated: Secondary | ICD-10-CM

## 2019-03-06 DIAGNOSIS — N189 Chronic kidney disease, unspecified: Secondary | ICD-10-CM | POA: Diagnosis not present

## 2019-03-06 DIAGNOSIS — Z7901 Long term (current) use of anticoagulants: Secondary | ICD-10-CM | POA: Diagnosis not present

## 2019-03-06 DIAGNOSIS — C250 Malignant neoplasm of head of pancreas: Secondary | ICD-10-CM | POA: Diagnosis not present

## 2019-03-06 DIAGNOSIS — Z79899 Other long term (current) drug therapy: Secondary | ICD-10-CM

## 2019-03-06 DIAGNOSIS — Z86718 Personal history of other venous thrombosis and embolism: Secondary | ICD-10-CM

## 2019-03-06 DIAGNOSIS — D649 Anemia, unspecified: Secondary | ICD-10-CM | POA: Insufficient documentation

## 2019-03-06 DIAGNOSIS — I10 Essential (primary) hypertension: Secondary | ICD-10-CM

## 2019-03-06 DIAGNOSIS — E119 Type 2 diabetes mellitus without complications: Secondary | ICD-10-CM

## 2019-03-06 DIAGNOSIS — Z95828 Presence of other vascular implants and grafts: Secondary | ICD-10-CM

## 2019-03-06 DIAGNOSIS — Z5111 Encounter for antineoplastic chemotherapy: Secondary | ICD-10-CM | POA: Diagnosis present

## 2019-03-06 LAB — CBC WITH DIFFERENTIAL (CANCER CENTER ONLY)
Abs Immature Granulocytes: 0.25 10*3/uL — ABNORMAL HIGH (ref 0.00–0.07)
Basophils Absolute: 0.1 10*3/uL (ref 0.0–0.1)
Basophils Relative: 1 %
Eosinophils Absolute: 0.1 10*3/uL (ref 0.0–0.5)
Eosinophils Relative: 1 %
HCT: 35.3 % — ABNORMAL LOW (ref 39.0–52.0)
Hemoglobin: 12 g/dL — ABNORMAL LOW (ref 13.0–17.0)
Immature Granulocytes: 3 %
Lymphocytes Relative: 19 %
Lymphs Abs: 1.9 10*3/uL (ref 0.7–4.0)
MCH: 30.2 pg (ref 26.0–34.0)
MCHC: 34 g/dL (ref 30.0–36.0)
MCV: 88.9 fL (ref 80.0–100.0)
Monocytes Absolute: 0.8 10*3/uL (ref 0.1–1.0)
Monocytes Relative: 8 %
Neutro Abs: 6.9 10*3/uL (ref 1.7–7.7)
Neutrophils Relative %: 68 %
Platelet Count: 184 10*3/uL (ref 150–400)
RBC: 3.97 MIL/uL — ABNORMAL LOW (ref 4.22–5.81)
RDW: 13.3 % (ref 11.5–15.5)
WBC Count: 9.9 10*3/uL (ref 4.0–10.5)
nRBC: 0 % (ref 0.0–0.2)

## 2019-03-06 LAB — CMP (CANCER CENTER ONLY)
ALT: 27 U/L (ref 0–44)
AST: 24 U/L (ref 15–41)
Albumin: 3.5 g/dL (ref 3.5–5.0)
Alkaline Phosphatase: 130 U/L — ABNORMAL HIGH (ref 38–126)
Anion gap: 11 (ref 5–15)
BUN: 23 mg/dL — ABNORMAL HIGH (ref 6–20)
CO2: 21 mmol/L — ABNORMAL LOW (ref 22–32)
Calcium: 8.9 mg/dL (ref 8.9–10.3)
Chloride: 100 mmol/L (ref 98–111)
Creatinine: 1.36 mg/dL — ABNORMAL HIGH (ref 0.61–1.24)
GFR, Est AFR Am: >60 mL/min (ref >60)
GFR, Estimated: 57 mL/min — ABNORMAL LOW (ref >60)
Glucose, Bld: 419 mg/dL — ABNORMAL HIGH (ref 70–99)
Potassium: 4.9 mmol/L (ref 3.5–5.1)
Sodium: 132 mmol/L — ABNORMAL LOW (ref 135–145)
Total Bilirubin: 0.2 mg/dL — ABNORMAL LOW (ref 0.3–1.2)
Total Protein: 7 g/dL (ref 6.5–8.1)

## 2019-03-06 MED ORDER — ATROPINE SULFATE 0.4 MG/ML IJ SOLN
INTRAMUSCULAR | Status: AC
  Filled 2019-03-06: qty 1

## 2019-03-06 MED ORDER — PALONOSETRON HCL INJECTION 0.25 MG/5ML
INTRAVENOUS | Status: AC
  Filled 2019-03-06: qty 5

## 2019-03-06 MED ORDER — SODIUM CHLORIDE 0.9% FLUSH
10.0000 mL | INTRAVENOUS | Status: DC | PRN
  Administered 2019-03-06: 15:00:00 10 mL
  Filled 2019-03-06: qty 10

## 2019-03-06 MED ORDER — HEPARIN SOD (PORK) LOCK FLUSH 100 UNIT/ML IV SOLN
500.0000 [IU] | Freq: Once | INTRAVENOUS | Status: AC | PRN
  Administered 2019-03-06: 15:00:00 500 [IU]
  Filled 2019-03-06: qty 5

## 2019-03-06 MED ORDER — FLUCONAZOLE 100 MG PO TABS: 100.0000 mg | ORAL_TABLET | Freq: Every day | ORAL | 0 refills | Status: AC

## 2019-03-06 MED ORDER — SODIUM CHLORIDE 0.9 % IV SOLN
INTRAVENOUS | Status: DC
  Administered 2019-03-06: 13:00:00 via INTRAVENOUS
  Filled 2019-03-06 (×2): qty 250

## 2019-03-06 MED ORDER — LORAZEPAM 0.5 MG PO TABS: 0.5000 mg | ORAL_TABLET | Freq: Three times a day (TID) | ORAL | 0 refills | Status: DC | PRN

## 2019-03-06 MED ORDER — SODIUM CHLORIDE 0.9% FLUSH
10.0000 mL | INTRAVENOUS | Status: DC | PRN
  Administered 2019-03-06: 10 mL
  Filled 2019-03-06: qty 10

## 2019-03-06 NOTE — Patient Instructions (Signed)
Dehydration, Adult  Dehydration is when there is not enough fluid or water in your body. This happens when you lose more fluids than you take in. Dehydration can range from mild to very bad. It should be treated right away to keep it from getting very bad. Symptoms of mild dehydration may include:  Thirst.  Dry lips.  Slightly dry mouth.  Dry, warm skin.  Dizziness. Symptoms of moderate dehydration may include:  Very dry mouth.  Muscle cramps.  Dark pee (urine). Pee may be the color of tea.  Your body making less pee.  Your eyes making fewer tears.  Heartbeat that is uneven or faster than normal (palpitations).  Headache.  Light-headedness, especially when you stand up from sitting.  Fainting (syncope). Symptoms of very bad dehydration may include:  Changes in skin, such as: ? Cold and clammy skin. ? Blotchy (mottled) or pale skin. ? Skin that does not quickly return to normal after being lightly pinched and let go (poor skin turgor).  Changes in body fluids, such as: ? Feeling very thirsty. ? Your eyes making fewer tears. ? Not sweating when body temperature is high, such as in hot weather. ? Your body making very little pee.  Changes in vital signs, such as: ? Weak pulse. ? Pulse that is more than 100 beats a minute when you are sitting still. ? Fast breathing. ? Low blood pressure.  Other changes, such as: ? Sunken eyes. ? Cold hands and feet. ? Confusion. ? Lack of energy (lethargy). ? Trouble waking up from sleep. ? Short-term weight loss. ? Unconsciousness. Follow these instructions at home:   If told by your doctor, drink an ORS: ? Make an ORS by using instructions on the package. ? Start by drinking small amounts, about  cup (120 mL) every 5-10 minutes. ? Slowly drink more until you have had the amount that your doctor said to have.  Drink enough clear fluid to keep your pee clear or pale yellow. If you were told to drink an ORS, finish the  ORS first, then start slowly drinking clear fluids. Drink fluids such as: ? Water. Do not drink only water by itself. Doing that can make the salt (sodium) level in your body get too low (hyponatremia). ? Ice chips. ? Fruit juice that you have added water to (diluted). ? Low-calorie sports drinks.  Avoid: ? Alcohol. ? Drinks that have a lot of sugar. These include high-calorie sports drinks, fruit juice that does not have water added, and soda. ? Caffeine. ? Foods that are greasy or have a lot of fat or sugar.  Take over-the-counter and prescription medicines only as told by your doctor.  Do not take salt tablets. Doing that can make the salt level in your body get too high (hypernatremia).  Eat foods that have minerals (electrolytes). Examples include bananas, oranges, potatoes, tomatoes, and spinach.  Keep all follow-up visits as told by your doctor. This is important. Contact a doctor if:  You have belly (abdominal) pain that: ? Gets worse. ? Stays in one area (localizes).  You have a rash.  You have a stiff neck.  You get angry or annoyed more easily than normal (irritability).  You are more sleepy than normal.  You have a harder time waking up than normal.  You feel: ? Weak. ? Dizzy. ? Very thirsty.  You have peed (urinated) only a small amount of very dark pee during 6-8 hours. Get help right away if:  You have   symptoms of very bad dehydration.  You cannot drink fluids without throwing up (vomiting).  Your symptoms get worse with treatment.  You have a fever.  You have a very bad headache.  You are throwing up or having watery poop (diarrhea) and it: ? Gets worse. ? Does not go away.  You have blood or something green (bile) in your throw-up.  You have blood in your poop (stool). This may cause poop to look black and tarry.  You have not peed in 6-8 hours.  You pass out (faint).  Your heart rate when you are sitting still is more than 100 beats a  minute.  You have trouble breathing. This information is not intended to replace advice given to you by your health care provider. Make sure you discuss any questions you have with your health care provider. Document Released: 08/15/2009 Document Revised: 05/08/2016 Document Reviewed: 12/13/2015 Elsevier Interactive Patient Education  2019 Elsevier Inc.  

## 2019-03-06 NOTE — Telephone Encounter (Signed)
TC to Dr. Cari Caraway in reference to Brian Torres's Glucose test today which was 419 Left message with answering service questions about a sliding scale for Brian Torres's insulin. Awaiting return call.

## 2019-03-06 NOTE — Progress Notes (Signed)
[place] OFFICE PROGRESS NOTE   Diagnosis: Pancreas cancer  INTERVAL HISTORY:   Mr. Micco returns as scheduled.  He completed cycle 2 FOLFIRINOX 02/20/2019.  He received Udenyca on the day of pump discontinuation.  He had nausea beginning day 1 and lasting for a week.  Zofran and Compazine were not effective.  Dexamethasone did not help.  He did not have vomiting.  He developed mouth sores.  No diarrhea.  No hand or foot pain or redness.  Stable mild numbness in the left small finger.  This predated chemotherapy.  He noted blood sugars increased into the 400 range when he was on steroids.  He developed leg and back pain following Udenyca.  Objective:  Vital signs in last 24 hours:  Blood pressure 117/73, pulse (!) 104, temperature 98 F (36.7 C), temperature source Oral, resp. rate 18, height 6\' 4"  (1.93 m), weight 197 lb 6.4 oz (89.5 kg), SpO2 100 %.    HEENT: White coating over tongue, question healing ulcerations. Vascular: Right lower leg is larger than the left lower leg. Neuro: Right foot drop. Skin: Palms without erythema. Port-A-Cath without erythema.  Lab Results:  Lab Results  Component Value Date   WBC 9.9 03/06/2019   HGB 12.0 (L) 03/06/2019   HCT 35.3 (L) 03/06/2019   MCV 88.9 03/06/2019   PLT 184 03/06/2019   NEUTROABS 6.9 03/06/2019    Imaging:  No results found.  Medications: I have reviewed the patient's current medications.  Assessment/Plan: 1. Pancreas head mass  Presented with jaundice, bilirubin elevated at 21.4  CTs abdomen/pelvis 12/20/2018-severe intrahepatic and extrahepatic biliary dilatation due to a possible 3.5 x 2.5 cm solid mass in the pancreatic head. Pancreatic ductal dilatation noted as well with multiple cystic lesions in the pancreatic tail and body.   CT chest 12/20/2018-occasional nonspecific small pulmonary nodules felt to likely be related to prior infection or inflammation.   ERCP by Dr. Watt Climes 12/21/2018.  The major papilla was on the rim of a diverticulum. The minor papilla appeared to be bulging. A biopsy of the duodenum adjacent to the minor papilla was performed. Plastic stent was placed into the ventral pancreatic duct. Biliary sphincterotomy performed. Covered metal stent placed into the common bile duct. Bile duct brushings showed benign reactive/reparative changes. Biopsy of polypoid duodenal mucosa suggestive of nodular peptic duodenitis, negative for dysplasia or malignancy.   Upper EUS by Dr. Paulita Fujita on 01/03/2019 showed a few cystic lesions in the pancreatic body and pancreatic tail. A stent was visualized in the common bile duct. Mass identified in the pancreatic head with fine-needle aspiration performed. Lesion appeared to abut or potentially superficially invade the superior mesenteric vein. No lymphadenopathy. No involvement of the superior mesenteric artery or celiac artery. Pathology returned suspicious for malignancy. Dr. Paulita Fujita notes if pathology shows adenocarcinoma the lesion would be staged T3 N0 MX.  Repeat ERCP and placement of a metal bile duct stent 01/24/2019  EUS 01/24/2019, T3N0 pancreas head mass with abutment of the SMV, FNA of the pancreas mass revealed adenocarcinoma, cytology from the bile duct stent revealed adenocarcinoma  Cycle 1 FOLFIRINOX4/04/2019  Cycle 2 FOLFIRINOX 02/20/2019, Udenyca added  Cycle 3 FOLFIRINOX 03/09/2019, Irinotecan and 5-fluorouracil dose reduced, Udenyca held  2. Biliary obstruction status post stent placement 3. History of left lower extremity DVT 2017, 2018-on Eliquis. 4. Type 2 diabetes 5. Hypertension 6. Chronic kidney disease 7. Tobacco use 8. Anemia, macrocytic. Transfused 2 units of blood 12/20/2018.On oral iron. Improved 02/01/2019 and 02/06/2019.  9. Port-A-Cath placement 02/02/2019, Dr. Barry Dienes 10. Right foot drop-likely secondary to peroneal nerve compression related to weight loss   Disposition: Mr. Sippel appears  stable.  He has completed 2 cycles of FOLFIRINOX.  He had significant delayed nausea and mucositis.  He had bone pain related to [facility].  We will send a prescription to his pharmacy for Ativan for the nausea.  He will contact the office if this is not effective.  Irinotecan and 5-fluorouracil will be dose reduced due to the mucositis.  [name] will be held.  Blood sugar returned markedly elevated at 419.  Creatinine mildly elevated.  We decided to hold chemotherapy today and reschedule for later in the week once blood sugars are under better control.  We will contact Dr. Davina Poke to request sliding scale insulin.  We will also ask the [facility] dietitian to meet with him regarding a diabetic diet.  He will receive IV fluids today.  He will return for lab and chemotherapy on 03/09/2019.  We will see him in follow-up prior to chemotherapy on 03/22/2019.  Patient seen with Dr. Benay Spice.  25 minutes were spent face-to-face at today's visit with the majority of that time involved in counseling/coordination of care.    [name] ANP/GNP-BC   03/06/2019  11:37 AM  This was a shared visit with [name].  Mr. Bazzi was interviewed and examined.  The last cycle of chemotherapy was complicated by prolonged nausea and mucositis.  We decided to dose reduce irinotecan and 5-fluorouracil with this cycle.  He will be prescribed Diflucan for oral candidiasis.  The blood sugar is markedly elevated today.  We decided to administer intravenous fluids and hold chemotherapy.  We contacted Dr. Davina Poke to recommend an insulin sliding scale with the plan to resume chemotherapy later this week.  The right foot drop is likely related to peripheral nerve compression in the setting of immobility and weight loss.  Julieanne Manson, MD

## 2019-03-07 ENCOUNTER — Telehealth: Payer: Self-pay | Admitting: Nurse Practitioner

## 2019-03-07 LAB — CANCER ANTIGEN 19-9: CA 19-9: 561 U/mL — ABNORMAL HIGH (ref 0–35)

## 2019-03-07 NOTE — Telephone Encounter (Signed)
Scheduled appt per 5/4 los.  Added treatment in the book for approval.  The port flush appt needs override access and will be added later.

## 2019-03-08 ENCOUNTER — Ambulatory Visit: Payer: Managed Care, Other (non HMO) | Admitting: Oncology

## 2019-03-08 ENCOUNTER — Inpatient Hospital Stay: Payer: Managed Care, Other (non HMO)

## 2019-03-08 ENCOUNTER — Other Ambulatory Visit: Payer: Managed Care, Other (non HMO)

## 2019-03-08 ENCOUNTER — Ambulatory Visit: Payer: Managed Care, Other (non HMO)

## 2019-03-09 ENCOUNTER — Other Ambulatory Visit: Payer: Self-pay

## 2019-03-09 ENCOUNTER — Telehealth: Payer: Self-pay

## 2019-03-09 ENCOUNTER — Inpatient Hospital Stay: Payer: Managed Care, Other (non HMO)

## 2019-03-09 VITALS — BP 109/67 | HR 99 | Temp 98.0°F | Resp 18

## 2019-03-09 DIAGNOSIS — Z5111 Encounter for antineoplastic chemotherapy: Secondary | ICD-10-CM | POA: Diagnosis not present

## 2019-03-09 DIAGNOSIS — C250 Malignant neoplasm of head of pancreas: Secondary | ICD-10-CM

## 2019-03-09 LAB — CMP (CANCER CENTER ONLY)
ALT: 28 U/L (ref 0–44)
AST: 18 U/L (ref 15–41)
Albumin: 3.3 g/dL — ABNORMAL LOW (ref 3.5–5.0)
Alkaline Phosphatase: 132 U/L — ABNORMAL HIGH (ref 38–126)
Anion gap: 12 (ref 5–15)
BUN: 17 mg/dL (ref 6–20)
CO2: 22 mmol/L (ref 22–32)
Calcium: 9 mg/dL (ref 8.9–10.3)
Chloride: 100 mmol/L (ref 98–111)
Creatinine: 1.15 mg/dL (ref 0.61–1.24)
GFR, Est AFR Am: >60 mL/min (ref >60)
GFR, Estimated: >60 mL/min (ref >60)
Glucose, Bld: 340 mg/dL — ABNORMAL HIGH (ref 70–99)
Potassium: 4.4 mmol/L (ref 3.5–5.1)
Sodium: 134 mmol/L — ABNORMAL LOW (ref 135–145)
Total Bilirubin: 0.2 mg/dL — ABNORMAL LOW (ref 0.3–1.2)
Total Protein: 7 g/dL (ref 6.5–8.1)

## 2019-03-09 LAB — GLUCOSE, CAPILLARY: Glucose-Capillary: 333 mg/dL — ABNORMAL HIGH (ref 70–99)

## 2019-03-09 MED ORDER — SODIUM CHLORIDE 0.9 % IV SOLN
120.0000 mg/m2 | Freq: Once | INTRAVENOUS | Status: AC
  Administered 2019-03-09: 260 mg via INTRAVENOUS
  Filled 2019-03-09: qty 13

## 2019-03-09 MED ORDER — SODIUM CHLORIDE 0.9% FLUSH
10.0000 mL | INTRAVENOUS | Status: DC | PRN
  Administered 2019-03-09: 14:00:00 10 mL
  Filled 2019-03-09: qty 10

## 2019-03-09 MED ORDER — SODIUM CHLORIDE 0.9 % IV SOLN
300.0000 mg/m2 | Freq: Once | INTRAVENOUS | Status: AC
  Administered 2019-03-09: 670 mg via INTRAVENOUS
  Filled 2019-03-09: qty 33.5

## 2019-03-09 MED ORDER — SODIUM CHLORIDE 0.9 % IV SOLN
Freq: Once | INTRAVENOUS | Status: AC
  Administered 2019-03-09: 09:00:00 via INTRAVENOUS
  Filled 2019-03-09: qty 5

## 2019-03-09 MED ORDER — SODIUM CHLORIDE 0.9 % IV SOLN
1800.0000 mg/m2 | INTRAVENOUS | Status: DC
  Administered 2019-03-09: 14:00:00 4000 mg via INTRAVENOUS
  Filled 2019-03-09: qty 80

## 2019-03-09 MED ORDER — LEUCOVORIN CALCIUM INJECTION 350 MG: 300.0000 mg/m2 | Freq: Once | INTRAVENOUS | Status: DC

## 2019-03-09 MED ORDER — DEXTROSE 5 % IV SOLN
Freq: Once | INTRAVENOUS | Status: AC
  Administered 2019-03-09: 12:00:00 via INTRAVENOUS
  Filled 2019-03-09: qty 250

## 2019-03-09 MED ORDER — ATROPINE SULFATE 1 MG/ML IJ SOLN: 0.5000 mg | Freq: Once | INTRAMUSCULAR | Status: DC | PRN

## 2019-03-09 MED ORDER — PALONOSETRON HCL INJECTION 0.25 MG/5ML
INTRAVENOUS | Status: AC
  Filled 2019-03-09: qty 5

## 2019-03-09 MED ORDER — PALONOSETRON HCL INJECTION 0.25 MG/5ML
0.2500 mg | Freq: Once | INTRAVENOUS | Status: AC
  Administered 2019-03-09: 08:00:00 0.25 mg via INTRAVENOUS

## 2019-03-09 MED ORDER — OXALIPLATIN CHEMO INJECTION 100 MG/20ML
89.0000 mg/m2 | Freq: Once | INTRAVENOUS | Status: AC
  Administered 2019-03-09: 10:00:00 200 mg via INTRAVENOUS
  Filled 2019-03-09: qty 40

## 2019-03-09 MED ORDER — DEXTROSE 5 % IV SOLN
Freq: Once | INTRAVENOUS | Status: AC
  Administered 2019-03-09: 08:00:00 via INTRAVENOUS
  Filled 2019-03-09: qty 250

## 2019-03-09 NOTE — Progress Notes (Signed)
Pt's BG this morning was 333 (finger stick). Per Dr. Benay Spice, ok to proceed with treatment with labs from 03/06/19. Pt to contact PCP in regards to blood glucose.

## 2019-03-09 NOTE — Telephone Encounter (Signed)
TC per Lattie Haw to [employer] office [phone]). Spoke with Deloris Ping who said she would give message to MD regarding pt needing a sliding scale to control BS. Marita Kansas stated that she would give me a call back [phone]) once she talks to MD.

## 2019-03-09 NOTE — Telephone Encounter (Signed)
Per Lattie Haw left message with [phone]) to follow up with patient regarding diabetic diet.

## 2019-03-09 NOTE — Patient Instructions (Signed)
[facility] Discharge Instructions for Patients Receiving Chemotherapy  Today you received the following chemotherapy agents Oxaliplatin (ELOXATIN), Leucovorin, Irinotecan (CAMPTOSAR) & Flourouracil (ADRUCIL).  To help prevent nausea and vomiting after your treatment, we encourage you to take your nausea medication as prescribed.   If you develop nausea and vomiting that is not controlled by your nausea medication, call the clinic.   BELOW ARE SYMPTOMS THAT SHOULD BE REPORTED IMMEDIATELY:  *FEVER GREATER THAN 100.5 F  *CHILLS WITH OR WITHOUT FEVER  NAUSEA AND VOMITING THAT IS NOT CONTROLLED WITH YOUR NAUSEA MEDICATION  *UNUSUAL SHORTNESS OF BREATH  *UNUSUAL BRUISING OR BLEEDING  TENDERNESS IN MOUTH AND THROAT WITH OR WITHOUT PRESENCE OF ULCERS  *URINARY PROBLEMS  *BOWEL PROBLEMS  UNUSUAL RASH Items with * indicate a potential emergency and should be followed up as soon as possible.  Feel free to call the clinic should you have any questions or concerns. The clinic phone number is [phone].  Please show the CHEMO ALERT CARD at check-in to the Emergency Department and triage nurse.   

## 2019-03-10 ENCOUNTER — Encounter: Payer: Self-pay | Admitting: Nurse Practitioner

## 2019-03-10 ENCOUNTER — Telehealth: Payer: Self-pay

## 2019-03-10 NOTE — Telephone Encounter (Signed)
Received return TC from [place] with Dr. Abigail Butts [facility] in reference to Mr. Wehrman and his sliding scale for insulin. Pt. Will take Humalog and sliding scale will be 150-199 3 units,200-249 5 units, 250-299 7 units, 300-349 9 units, 350-399 11 units, 400-449 13 units, Pt. Informed anything higher then 449 he should contact the doctor

## 2019-03-11 ENCOUNTER — Inpatient Hospital Stay: Payer: Managed Care, Other (non HMO)

## 2019-03-11 ENCOUNTER — Other Ambulatory Visit: Payer: Self-pay

## 2019-03-11 VITALS — BP 124/82 | HR 91 | Temp 97.9°F | Resp 16

## 2019-03-11 DIAGNOSIS — C250 Malignant neoplasm of head of pancreas: Secondary | ICD-10-CM

## 2019-03-11 DIAGNOSIS — Z5111 Encounter for antineoplastic chemotherapy: Secondary | ICD-10-CM | POA: Diagnosis not present

## 2019-03-11 DIAGNOSIS — Z95828 Presence of other vascular implants and grafts: Secondary | ICD-10-CM

## 2019-03-11 MED ORDER — PEGFILGRASTIM-CBQV 6 MG/0.6ML ~~LOC~~ SOSY
PREFILLED_SYRINGE | SUBCUTANEOUS | Status: AC
  Filled 2019-03-11: qty 0.6

## 2019-03-11 MED ORDER — SODIUM CHLORIDE 0.9% FLUSH
10.0000 mL | INTRAVENOUS | Status: DC | PRN
  Administered 2019-03-11: 10 mL
  Filled 2019-03-11: qty 10

## 2019-03-11 MED ORDER — HEPARIN SOD (PORK) LOCK FLUSH 100 UNIT/ML IV SOLN
500.0000 [IU] | Freq: Once | INTRAVENOUS | Status: AC | PRN
  Administered 2019-03-11: 500 [IU]
  Filled 2019-03-11: qty 5

## 2019-03-11 MED ORDER — PEGFILGRASTIM-CBQV 6 MG/0.6ML ~~LOC~~ SOSY: 6.0000 mg | PREFILLED_SYRINGE | Freq: Once | SUBCUTANEOUS | Status: DC

## 2019-03-11 NOTE — Progress Notes (Signed)
Patient presented to treatment area for pump d/c. States he is not supposed to get [place] today; however, orders for Brian Torres are in treatment plan. In review of office note from [name] NP (03/09/2019), "Brian Torres held." Garrett not given at this time. Will clarify with [name] on Monday, 03/13/2019.

## 2019-03-13 ENCOUNTER — Encounter: Payer: Self-pay | Admitting: Nutrition

## 2019-03-13 ENCOUNTER — Other Ambulatory Visit: Payer: Self-pay | Admitting: *Deleted

## 2019-03-13 ENCOUNTER — Other Ambulatory Visit: Payer: Self-pay | Admitting: Nurse Practitioner

## 2019-03-13 DIAGNOSIS — C250 Malignant neoplasm of head of pancreas: Secondary | ICD-10-CM

## 2019-03-13 NOTE — Progress Notes (Unsigned)
Received request for diabetic diet teaching. Informed Britt Boozer, LPN and Myer Peer that patient needs a referral to nutrition and diabetes education services for DM teaching. Eustaquio Maize will communicate to patient.

## 2019-03-14 ENCOUNTER — Telehealth: Payer: Self-pay | Admitting: Oncology

## 2019-03-14 NOTE — Telephone Encounter (Signed)
Scheduled nutrition appt per sch msg. Called and spoke with patient. Confirmed date and time of phone visit

## 2019-03-15 ENCOUNTER — Other Ambulatory Visit: Payer: Self-pay | Admitting: Nurse Practitioner

## 2019-03-15 ENCOUNTER — Encounter: Payer: Self-pay | Admitting: Nurse Practitioner

## 2019-03-15 DIAGNOSIS — C250 Malignant neoplasm of head of pancreas: Secondary | ICD-10-CM

## 2019-03-15 MED ORDER — LORAZEPAM 0.5 MG PO TABS: 0.5000 mg | ORAL_TABLET | Freq: Three times a day (TID) | ORAL | 0 refills | Status: DC | PRN

## 2019-03-15 MED ORDER — MAGIC MOUTHWASH: 5.0000 mL | Freq: Four times a day (QID) | ORAL | 0 refills | Status: DC | PRN

## 2019-03-16 ENCOUNTER — Telehealth: Payer: Self-pay | Admitting: Oncology

## 2019-03-16 NOTE — Telephone Encounter (Signed)
appt 5/18 cancelled per 5/13 sch message - pt aware of appt being cancelled

## 2019-03-19 ENCOUNTER — Other Ambulatory Visit: Payer: Self-pay | Admitting: Oncology

## 2019-03-20 ENCOUNTER — Other Ambulatory Visit: Payer: Self-pay | Admitting: *Deleted

## 2019-03-20 ENCOUNTER — Encounter: Payer: Managed Care, Other (non HMO) | Admitting: Nutrition

## 2019-03-20 DIAGNOSIS — C250 Malignant neoplasm of head of pancreas: Secondary | ICD-10-CM

## 2019-03-21 ENCOUNTER — Inpatient Hospital Stay (HOSPITAL_BASED_OUTPATIENT_CLINIC_OR_DEPARTMENT_OTHER): Payer: Managed Care, Other (non HMO) | Admitting: Oncology

## 2019-03-21 ENCOUNTER — Telehealth: Payer: Self-pay | Admitting: Oncology

## 2019-03-21 ENCOUNTER — Inpatient Hospital Stay: Payer: Managed Care, Other (non HMO)

## 2019-03-21 ENCOUNTER — Other Ambulatory Visit: Payer: Self-pay

## 2019-03-21 VITALS — BP 127/84 | HR 102 | Temp 99.1°F | Resp 18 | Ht 76.0 in | Wt 196.4 lb

## 2019-03-21 VITALS — HR 102

## 2019-03-21 DIAGNOSIS — D649 Anemia, unspecified: Secondary | ICD-10-CM | POA: Diagnosis not present

## 2019-03-21 DIAGNOSIS — Z7901 Long term (current) use of anticoagulants: Secondary | ICD-10-CM

## 2019-03-21 DIAGNOSIS — I10 Essential (primary) hypertension: Secondary | ICD-10-CM

## 2019-03-21 DIAGNOSIS — C250 Malignant neoplasm of head of pancreas: Secondary | ICD-10-CM | POA: Diagnosis not present

## 2019-03-21 DIAGNOSIS — F17210 Nicotine dependence, cigarettes, uncomplicated: Secondary | ICD-10-CM

## 2019-03-21 DIAGNOSIS — Z86718 Personal history of other venous thrombosis and embolism: Secondary | ICD-10-CM

## 2019-03-21 DIAGNOSIS — Z95828 Presence of other vascular implants and grafts: Secondary | ICD-10-CM

## 2019-03-21 DIAGNOSIS — E119 Type 2 diabetes mellitus without complications: Secondary | ICD-10-CM | POA: Diagnosis not present

## 2019-03-21 DIAGNOSIS — Z5111 Encounter for antineoplastic chemotherapy: Secondary | ICD-10-CM | POA: Diagnosis not present

## 2019-03-21 DIAGNOSIS — N189 Chronic kidney disease, unspecified: Secondary | ICD-10-CM

## 2019-03-21 DIAGNOSIS — Z79899 Other long term (current) drug therapy: Secondary | ICD-10-CM

## 2019-03-21 LAB — CMP (CANCER CENTER ONLY)
ALT: 24 U/L (ref 0–44)
AST: 17 U/L (ref 15–41)
Albumin: 3.4 g/dL — ABNORMAL LOW (ref 3.5–5.0)
Alkaline Phosphatase: 112 U/L (ref 38–126)
Anion gap: 11 (ref 5–15)
BUN: 12 mg/dL (ref 6–20)
CO2: 22 mmol/L (ref 22–32)
Calcium: 9.4 mg/dL (ref 8.9–10.3)
Chloride: 102 mmol/L (ref 98–111)
Creatinine: 1.04 mg/dL (ref 0.61–1.24)
GFR, Est AFR Am: >60 mL/min (ref >60)
GFR, Estimated: >60 mL/min (ref >60)
Glucose, Bld: 199 mg/dL — ABNORMAL HIGH (ref 70–99)
Potassium: 4.8 mmol/L (ref 3.5–5.1)
Sodium: 135 mmol/L (ref 135–145)
Total Bilirubin: 0.3 mg/dL (ref 0.3–1.2)
Total Protein: 6.9 g/dL (ref 6.5–8.1)

## 2019-03-21 LAB — CBC WITH DIFFERENTIAL (CANCER CENTER ONLY)
Abs Immature Granulocytes: 0.03 10*3/uL (ref 0.00–0.07)
Basophils Absolute: 0.1 10*3/uL (ref 0.0–0.1)
Basophils Relative: 1 %
Eosinophils Absolute: 0 10*3/uL (ref 0.0–0.5)
Eosinophils Relative: 1 %
HCT: 35.6 % — ABNORMAL LOW (ref 39.0–52.0)
Hemoglobin: 12.1 g/dL — ABNORMAL LOW (ref 13.0–17.0)
Immature Granulocytes: 1 %
Lymphocytes Relative: 26 %
Lymphs Abs: 1.6 10*3/uL (ref 0.7–4.0)
MCH: 29.4 pg (ref 26.0–34.0)
MCHC: 34 g/dL (ref 30.0–36.0)
MCV: 86.6 fL (ref 80.0–100.0)
Monocytes Absolute: 0.8 10*3/uL (ref 0.1–1.0)
Monocytes Relative: 13 %
Neutro Abs: 3.6 10*3/uL (ref 1.7–7.7)
Neutrophils Relative %: 58 %
Platelet Count: 220 10*3/uL (ref 150–400)
RBC: 4.11 MIL/uL — ABNORMAL LOW (ref 4.22–5.81)
RDW: 13.6 % (ref 11.5–15.5)
WBC Count: 6.1 10*3/uL (ref 4.0–10.5)
nRBC: 0 % (ref 0.0–0.2)

## 2019-03-21 MED ORDER — SODIUM CHLORIDE 0.9 % IV SOLN
Freq: Once | INTRAVENOUS | Status: AC
  Administered 2019-03-21: 10:00:00 via INTRAVENOUS
  Filled 2019-03-21: qty 5

## 2019-03-21 MED ORDER — SODIUM CHLORIDE 0.9 % IV SOLN
1800.0000 mg/m2 | INTRAVENOUS | Status: DC
  Administered 2019-03-21: 15:00:00 4000 mg via INTRAVENOUS
  Filled 2019-03-21: qty 80

## 2019-03-21 MED ORDER — SODIUM CHLORIDE 0.9 % IV SOLN
INTRAVENOUS | Status: DC
  Administered 2019-03-21: 13:00:00 via INTRAVENOUS
  Filled 2019-03-21: qty 250

## 2019-03-21 MED ORDER — IRINOTECAN HCL CHEMO INJECTION 100 MG/5ML
120.0000 mg/m2 | Freq: Once | INTRAVENOUS | Status: AC
  Administered 2019-03-21: 13:00:00 260 mg via INTRAVENOUS
  Filled 2019-03-21: qty 13

## 2019-03-21 MED ORDER — OXALIPLATIN CHEMO INJECTION 100 MG/20ML
89.0000 mg/m2 | Freq: Once | INTRAVENOUS | Status: AC
  Administered 2019-03-21: 11:00:00 200 mg via INTRAVENOUS
  Filled 2019-03-21: qty 40

## 2019-03-21 MED ORDER — DEXTROSE 5 % IV SOLN
Freq: Once | INTRAVENOUS | Status: AC
  Administered 2019-03-21: 10:00:00 via INTRAVENOUS
  Filled 2019-03-21: qty 250

## 2019-03-21 MED ORDER — DEXTROSE 5 % IV SOLN
Freq: Once | INTRAVENOUS | Status: DC
  Filled 2019-03-21: qty 250

## 2019-03-21 MED ORDER — PALONOSETRON HCL INJECTION 0.25 MG/5ML
0.2500 mg | Freq: Once | INTRAVENOUS | Status: AC
  Administered 2019-03-21: 0.25 mg via INTRAVENOUS

## 2019-03-21 MED ORDER — PALONOSETRON HCL INJECTION 0.25 MG/5ML
INTRAVENOUS | Status: AC
  Filled 2019-03-21: qty 5

## 2019-03-21 MED ORDER — LEUCOVORIN CALCIUM INJECTION 350 MG
300.0000 mg/m2 | Freq: Once | INTRAVENOUS | Status: AC
  Administered 2019-03-21: 13:00:00 670 mg via INTRAVENOUS
  Filled 2019-03-21: qty 33.5

## 2019-03-21 MED ORDER — SODIUM CHLORIDE 0.9% FLUSH
10.0000 mL | INTRAVENOUS | Status: DC | PRN
  Administered 2019-03-21: 10 mL
  Filled 2019-03-21: qty 10

## 2019-03-21 NOTE — Progress Notes (Signed)
[place] OFFICE PROGRESS NOTE   Diagnosis: Pancreas cancer  INTERVAL HISTORY:   Mr. Laskaris returns as scheduled.  Complete another cycle FOLFIRINOX on 03/09/2019.  He reports less nausea following this cycle of chemotherapy.  Lorazepam helps the nausea.  No diarrhea.  He had soreness in the mouth without discrete ulcers.  He reports his blood sugar is under better control.  He has persistent cold sensitivity, no peripheral numbness.  No pain.  He played golf last weekend.  Objective:  Vital signs in last 24 hours:  Blood pressure 127/84, pulse (!) 102, temperature 99.1 F (37.3 C), temperature source Oral, resp. rate 18, height 6\' 4"  (1.93 m), weight 196 lb 6.4 oz (89.1 kg), SpO2 100 %.    HEENT: No thrush or ulcers Resp: Lungs clear bilaterally Cardio: Regular rate and rhythm GI: No hepatomegaly, nontender Vascular: Trace edema with varicosities of the left lower leg  Skin: Palms without erythema  Portacath/PICC-without erythema  Lab Results:  Lab Results  Component Value Date   WBC 6.1 03/21/2019   HGB 12.1 (L) 03/21/2019   HCT 35.6 (L) 03/21/2019   MCV 86.6 03/21/2019   PLT 220 03/21/2019   NEUTROABS 3.6 03/21/2019    CMP  Lab Results  Component Value Date   NA 135 03/21/2019   K 4.8 03/21/2019   CL 102 03/21/2019   CO2 22 03/21/2019   GLUCOSE 199 (H) 03/21/2019   BUN 12 03/21/2019   CREATININE 1.04 03/21/2019   CALCIUM 9.4 03/21/2019   PROT 6.9 03/21/2019   ALBUMIN 3.4 (L) 03/21/2019   AST 17 03/21/2019   ALT 24 03/21/2019   ALKPHOS 112 03/21/2019   BILITOT 0.3 03/21/2019   GFRNONAA >60 03/21/2019   GFRAA >60 03/21/2019     Medications: I have reviewed the patient's current medications.   Assessment/Plan: 1. Pancreas head mass  Presented with jaundice, bilirubin elevated at 21.4  CTs abdomen/pelvis 12/20/2018-severe intrahepatic and extrahepatic biliary dilatation due to a possible 3.5 x 2.5 cm solid mass in the pancreatic head.  Pancreatic ductal dilatation noted as well with multiple cystic lesions in the pancreatic tail and body.   CT chest 12/20/2018-occasional nonspecific small pulmonary nodules felt to likely be related to prior infection or inflammation.   ERCP by Dr. Watt Climes 12/21/2018. The major papilla was on the rim of a diverticulum. The minor papilla appeared to be bulging. A biopsy of the duodenum adjacent to the minor papilla was performed. Plastic stent was placed into the ventral pancreatic duct. Biliary sphincterotomy performed. Covered metal stent placed into the common bile duct. Bile duct brushings showed benign reactive/reparative changes. Biopsy of polypoid duodenal mucosa suggestive of nodular peptic duodenitis, negative for dysplasia or malignancy.   Upper EUS by Dr. Paulita Fujita on 01/03/2019 showed a few cystic lesions in the pancreatic body and pancreatic tail. A stent was visualized in the common bile duct. Mass identified in the pancreatic head with fine-needle aspiration performed. Lesion appeared to abut or potentially superficially invade the superior mesenteric vein. No lymphadenopathy. No involvement of the superior mesenteric artery or celiac artery. Pathology returned suspicious for malignancy. Dr. Paulita Fujita notes if pathology shows adenocarcinoma the lesion would be staged T3 N0 MX.  Repeat ERCP and placement of a metal bile duct stent 01/24/2019  EUS 01/24/2019, T3N0 pancreas head mass with abutment of the SMV, FNA of the pancreas mass revealed adenocarcinoma, cytology from the bile duct stent revealed adenocarcinoma  Cycle 1 FOLFIRINOX4/04/2019  Cycle 2 FOLFIRINOX 02/20/2019, Udenyca added  Cycle 3 FOLFIRINOX 03/09/2019, Irinotecan and 5-fluorouracil dose reduced, Udenyca held  Cycle 4 FOLFIRINOX 03/21/2019 -Udenyca held  2. Biliary obstruction status post stent placement 3. History of left lower extremity DVT 2017, 2018-on Eliquis. 4. Type 2 diabetes 5. Hypertension 6. Chronic  kidney disease 7. Tobacco use 8. Anemia, macrocytic. Transfused 2 units of blood 12/20/2018.On oral iron. Improved 02/01/2019 and 02/06/2019. 9. Port-A-Cath placement 02/02/2019, Dr. Barry Dienes 10. Right foot drop-likely secondary to peroneal nerve compression related to weight loss    Disposition: Mr. Heberle tolerated the last cycle of chemotherapy with less acute toxicity.  He had less nausea and mucositis.  He felt better when he did not receive G-CSF.  The plan is to proceed with cycle 4 FOLFIRINOX today.  [name] will be held again with this cycle.  He will call for a fever or symptoms of an infection.  Mr. Trautman will return for an office visit and cycle 5 FOLFIRINOX in 2 weeks.  The plan to complete 6 cycles of FOLFIRINOX prior to a restaging CT evaluation.  Betsy Coder, MD  03/21/2019  9:32 AM

## 2019-03-21 NOTE — Telephone Encounter (Signed)
Scheduled appt per 5/19 los.  Left a detailed voice message of appt date and time.

## 2019-03-21 NOTE — Patient Instructions (Signed)

## 2019-03-21 NOTE — Patient Instructions (Signed)
[facility] Discharge Instructions for Patients Receiving Chemotherapy  Today you received the following chemotherapy agents Oxaliplatin, Adrucil, Irinotecan and Lecuovorin.  To help prevent nausea and vomiting after your treatment, we encourage you to take your nausea medication as directed BUT NO ZOFRAN FOR 3 DAYS.    If you develop nausea and vomiting that is not controlled by your nausea medication, call the clinic.   BELOW ARE SYMPTOMS THAT SHOULD BE REPORTED IMMEDIATELY:  *FEVER GREATER THAN 100.5 F  *CHILLS WITH OR WITHOUT FEVER  NAUSEA AND VOMITING THAT IS NOT CONTROLLED WITH YOUR NAUSEA MEDICATION  *UNUSUAL SHORTNESS OF BREATH  *UNUSUAL BRUISING OR BLEEDING  TENDERNESS IN MOUTH AND THROAT WITH OR WITHOUT PRESENCE OF ULCERS  *URINARY PROBLEMS  *BOWEL PROBLEMS  UNUSUAL RASH Items with * indicate a potential emergency and should be followed up as soon as possible.  Feel free to call the clinic you have any questions or concerns. The clinic phone number is [phone].  Please show the [facility] at check-in to the Emergency Department and triage nurse.

## 2019-03-22 ENCOUNTER — Encounter: Payer: Self-pay | Admitting: Nurse Practitioner

## 2019-03-22 ENCOUNTER — Telehealth: Payer: Self-pay | Admitting: Oncology

## 2019-03-22 NOTE — Telephone Encounter (Signed)
Patient called in for me to schedule his treatment time for an earlier time and to cancel an appt he had at another location.  I cancelled the appt per the patient request and rescheduled his treatment appt for an earlier time.  Patient aware of appt new time.

## 2019-03-23 ENCOUNTER — Inpatient Hospital Stay: Payer: Managed Care, Other (non HMO)

## 2019-03-23 ENCOUNTER — Other Ambulatory Visit: Payer: Self-pay

## 2019-03-23 VITALS — BP 128/72 | HR 88 | Temp 98.2°F | Resp 16

## 2019-03-23 DIAGNOSIS — C250 Malignant neoplasm of head of pancreas: Secondary | ICD-10-CM

## 2019-03-23 DIAGNOSIS — Z5111 Encounter for antineoplastic chemotherapy: Secondary | ICD-10-CM | POA: Diagnosis not present

## 2019-03-23 MED ORDER — HEPARIN SOD (PORK) LOCK FLUSH 100 UNIT/ML IV SOLN
500.0000 [IU] | Freq: Once | INTRAVENOUS | Status: AC | PRN
  Administered 2019-03-23: 500 [IU]
  Filled 2019-03-23: qty 5

## 2019-03-23 MED ORDER — SODIUM CHLORIDE 0.9% FLUSH
10.0000 mL | INTRAVENOUS | Status: DC | PRN
  Administered 2019-03-23: 10 mL
  Filled 2019-03-23: qty 10

## 2019-04-02 ENCOUNTER — Other Ambulatory Visit: Payer: Self-pay | Admitting: Oncology

## 2019-04-04 ENCOUNTER — Inpatient Hospital Stay (HOSPITAL_BASED_OUTPATIENT_CLINIC_OR_DEPARTMENT_OTHER): Payer: Managed Care, Other (non HMO) | Admitting: Nurse Practitioner

## 2019-04-04 ENCOUNTER — Inpatient Hospital Stay: Payer: Managed Care, Other (non HMO) | Attending: Oncology

## 2019-04-04 ENCOUNTER — Encounter: Payer: Self-pay | Admitting: Nurse Practitioner

## 2019-04-04 ENCOUNTER — Inpatient Hospital Stay: Payer: Managed Care, Other (non HMO)

## 2019-04-04 ENCOUNTER — Other Ambulatory Visit: Payer: Self-pay

## 2019-04-04 ENCOUNTER — Telehealth: Payer: Self-pay

## 2019-04-04 VITALS — BP 120/66 | HR 79 | Temp 97.5°F | Resp 18 | Ht 76.0 in | Wt 204.3 lb

## 2019-04-04 DIAGNOSIS — Z7901 Long term (current) use of anticoagulants: Secondary | ICD-10-CM

## 2019-04-04 DIAGNOSIS — Z5111 Encounter for antineoplastic chemotherapy: Secondary | ICD-10-CM | POA: Diagnosis present

## 2019-04-04 DIAGNOSIS — E1122 Type 2 diabetes mellitus with diabetic chronic kidney disease: Secondary | ICD-10-CM

## 2019-04-04 DIAGNOSIS — C250 Malignant neoplasm of head of pancreas: Secondary | ICD-10-CM | POA: Insufficient documentation

## 2019-04-04 DIAGNOSIS — Z452 Encounter for adjustment and management of vascular access device: Secondary | ICD-10-CM | POA: Insufficient documentation

## 2019-04-04 DIAGNOSIS — Z95828 Presence of other vascular implants and grafts: Secondary | ICD-10-CM

## 2019-04-04 DIAGNOSIS — I129 Hypertensive chronic kidney disease with stage 1 through stage 4 chronic kidney disease, or unspecified chronic kidney disease: Secondary | ICD-10-CM

## 2019-04-04 DIAGNOSIS — N189 Chronic kidney disease, unspecified: Secondary | ICD-10-CM

## 2019-04-04 DIAGNOSIS — Z86718 Personal history of other venous thrombosis and embolism: Secondary | ICD-10-CM | POA: Insufficient documentation

## 2019-04-04 LAB — CMP (CANCER CENTER ONLY)
ALT: 18 U/L (ref 0–44)
AST: 17 U/L (ref 15–41)
Albumin: 3.2 g/dL — ABNORMAL LOW (ref 3.5–5.0)
Alkaline Phosphatase: 98 U/L (ref 38–126)
Anion gap: 8 (ref 5–15)
BUN: 9 mg/dL (ref 6–20)
CO2: 24 mmol/L (ref 22–32)
Calcium: 8.7 mg/dL — ABNORMAL LOW (ref 8.9–10.3)
Chloride: 103 mmol/L (ref 98–111)
Creatinine: 0.87 mg/dL (ref 0.61–1.24)
GFR, Est AFR Am: >60 mL/min (ref >60)
GFR, Estimated: >60 mL/min (ref >60)
Glucose, Bld: 119 mg/dL — ABNORMAL HIGH (ref 70–99)
Potassium: 4.2 mmol/L (ref 3.5–5.1)
Sodium: 135 mmol/L (ref 135–145)
Total Bilirubin: 0.2 mg/dL — ABNORMAL LOW (ref 0.3–1.2)
Total Protein: 6.5 g/dL (ref 6.5–8.1)

## 2019-04-04 LAB — CBC WITH DIFFERENTIAL (CANCER CENTER ONLY)
Abs Immature Granulocytes: 0.04 10*3/uL (ref 0.00–0.07)
Basophils Absolute: 0.1 10*3/uL (ref 0.0–0.1)
Basophils Relative: 1 %
Eosinophils Absolute: 0.1 10*3/uL (ref 0.0–0.5)
Eosinophils Relative: 2 %
HCT: 31.3 % — ABNORMAL LOW (ref 39.0–52.0)
Hemoglobin: 10.5 g/dL — ABNORMAL LOW (ref 13.0–17.0)
Immature Granulocytes: 1 %
Lymphocytes Relative: 40 %
Lymphs Abs: 1.9 10*3/uL (ref 0.7–4.0)
MCH: 29.8 pg (ref 26.0–34.0)
MCHC: 33.5 g/dL (ref 30.0–36.0)
MCV: 88.9 fL (ref 80.0–100.0)
Monocytes Absolute: 0.7 10*3/uL (ref 0.1–1.0)
Monocytes Relative: 14 %
Neutro Abs: 2 10*3/uL (ref 1.7–7.7)
Neutrophils Relative %: 42 %
Platelet Count: 118 10*3/uL — ABNORMAL LOW (ref 150–400)
RBC: 3.52 MIL/uL — ABNORMAL LOW (ref 4.22–5.81)
RDW: 15.2 % (ref 11.5–15.5)
WBC Count: 4.7 10*3/uL (ref 4.0–10.5)
nRBC: 0 % (ref 0.0–0.2)

## 2019-04-04 MED ORDER — HEPARIN SOD (PORK) LOCK FLUSH 100 UNIT/ML IV SOLN
500.0000 [IU] | Freq: Once | INTRAVENOUS | Status: AC | PRN
  Administered 2019-04-04: 500 [IU]
  Filled 2019-04-04: qty 5

## 2019-04-04 MED ORDER — SODIUM CHLORIDE 0.9% FLUSH
10.0000 mL | INTRAVENOUS | Status: DC | PRN
  Administered 2019-04-04: 10 mL
  Filled 2019-04-04: qty 10

## 2019-04-04 NOTE — Telephone Encounter (Signed)
Per Patrica Duel to [facility] [phone]) for magic mouth wash. Maloxx, nystatin, benadryl. 240cc 55mL swish and spit Q4xD PRN. Has been called in

## 2019-04-04 NOTE — Progress Notes (Signed)
[facility] OFFICE PROGRESS NOTE   Diagnosis: Pancreas cancer  INTERVAL HISTORY:   Brian Torres returns as scheduled.  He completed cycle 4 FOLFIRINOX 03/21/2019.  He reports having a "good week".  He had minimal nausea.  No mouth sores.  No diarrhea.  He has mild cold sensitivity right hand fingertips and tip of tongue.  No numbness or tingling unrelated to cold exposure.  He denies bleeding.  Objective:  Vital signs in last 24 hours:  Blood pressure 120/66, pulse 79, temperature (!) 97.5 F (36.4 C), temperature source Oral, resp. rate 18, height 6\' 4"  (1.93 m), weight 204 lb 4.8 oz (92.7 kg), SpO2 100 %.    HEENT: Mild white coating over tongue.  No ulcers. GI: Abdomen soft and nontender.  No hepatomegaly. Vascular: Left lower leg with trace edema, varicosities. Neuro: Vibratory sense mildly decreased over the fingertips per tuning fork exam. Skin: Palms without erythema. Port-A-Cath without erythema.   Lab Results:  Lab Results  Component Value Date   WBC 4.7 04/04/2019   HGB 10.5 (L) 04/04/2019   HCT 31.3 (L) 04/04/2019   MCV 88.9 04/04/2019   PLT 118 (L) 04/04/2019   NEUTROABS 2.0 04/04/2019    Imaging:  No results found.  Medications: I have reviewed the patient's current medications.  Assessment/Plan: 1. Pancreas head mass  Presented with jaundice, bilirubin elevated at 21.4  CTs abdomen/pelvis 12/20/2018-severe intrahepatic and extrahepatic biliary dilatation due to a possible 3.5 x 2.5 cm solid mass in the pancreatic head. Pancreatic ductal dilatation noted as well with multiple cystic lesions in the pancreatic tail and body.   CT chest 12/20/2018-occasional nonspecific small pulmonary nodules felt to likely be related to prior infection or inflammation.   ERCP by Dr. Watt Climes 12/21/2018. The major papilla was on the rim of a diverticulum. The minor papilla appeared to be bulging. A biopsy of the duodenum adjacent to the minor papilla was  performed. Plastic stent was placed into the ventral pancreatic duct. Biliary sphincterotomy performed. Covered metal stent placed into the common bile duct. Bile duct brushings showed benign reactive/reparative changes. Biopsy of polypoid duodenal mucosa suggestive of nodular peptic duodenitis, negative for dysplasia or malignancy.   Upper EUS by Dr. Paulita Fujita on 01/03/2019 showed a few cystic lesions in the pancreatic body and pancreatic tail. A stent was visualized in the common bile duct. Mass identified in the pancreatic head with fine-needle aspiration performed. Lesion appeared to abut or potentially superficially invade the superior mesenteric vein. No lymphadenopathy. No involvement of the superior mesenteric artery or celiac artery. Pathology returned suspicious for malignancy. Dr. Paulita Fujita notes if pathology shows adenocarcinoma the lesion would be staged T3 N0 MX.  Repeat ERCP and placement of a metal bile duct stent 01/24/2019  EUS 01/24/2019, T3N0 pancreas head mass with abutment of the SMV, FNA of the pancreas mass revealed adenocarcinoma, cytology from the bile duct stent revealed adenocarcinoma  Cycle 1 FOLFIRINOX4/04/2019  Cycle 2 FOLFIRINOX4/20/2020, Udenyca added  Cycle 3 FOLFIRINOX 03/09/2019, Irinotecan and 5-fluorouracil dose reduced, Udenyca held  Cycle 4 FOLFIRINOX 03/21/2019 -Udenyca held  Cycle 5 FOLFIRINOX 04/05/2019  2. Biliary obstruction status post stent placement 3. History of left lower extremity DVT 2017, 2018-on Eliquis. 4. Type 2 diabetes 5. Hypertension 6. Chronic kidney disease 7. Tobacco use 8. Anemia, macrocytic. Transfused 2 units of blood 12/20/2018.On oral iron. Improved 02/01/2019 and 02/06/2019. 9. Port-A-Cath placement 02/02/2019, Dr. Barry Dienes 10. Right foot drop-likely secondary to peroneal nerve compression related to weight loss  Disposition: [name] appears stable.  He has completed 4 cycles of FOLFIRINOX.  Plan to proceed with cycle 5  as scheduled on 04/05/2019.  He will undergo restaging CT scans after completing 6 cycles.  We reviewed the CBC from today.  Counts are adequate to proceed with treatment.  We discussed resuming Udenyca on the day of pump discontinuation due to the neutrophil count in low normal range.  He has significant bone pain with Udenyca in the past and prefers it not be given.  [name] will be held with this cycle.  He understands to contact the office with fever, chills, other signs of infection.  He will return for lab, follow-up and cycle 6 FOLFIRINOX in 2 weeks.  He will contact the office in the interim as outlined above or with any other problems.  Patient seen with Dr. Benay Spice.    [name] ANP/GNP-BC   04/04/2019  3:37 PM

## 2019-04-05 ENCOUNTER — Inpatient Hospital Stay: Payer: Managed Care, Other (non HMO)

## 2019-04-05 ENCOUNTER — Other Ambulatory Visit: Payer: Self-pay

## 2019-04-05 ENCOUNTER — Ambulatory Visit: Payer: Managed Care, Other (non HMO) | Admitting: *Deleted

## 2019-04-05 ENCOUNTER — Ambulatory Visit: Payer: Managed Care, Other (non HMO)

## 2019-04-05 ENCOUNTER — Telehealth: Payer: Self-pay | Admitting: Nurse Practitioner

## 2019-04-05 VITALS — BP 133/79 | HR 84 | Temp 98.9°F | Resp 17

## 2019-04-05 DIAGNOSIS — Z5111 Encounter for antineoplastic chemotherapy: Secondary | ICD-10-CM | POA: Diagnosis not present

## 2019-04-05 DIAGNOSIS — C250 Malignant neoplasm of head of pancreas: Secondary | ICD-10-CM

## 2019-04-05 LAB — CANCER ANTIGEN 19-9: CA 19-9: 152 U/mL — ABNORMAL HIGH (ref 0–35)

## 2019-04-05 MED ORDER — ATROPINE SULFATE 0.4 MG/ML IJ SOLN
INTRAMUSCULAR | Status: AC
  Filled 2019-04-05: qty 1

## 2019-04-05 MED ORDER — OXALIPLATIN CHEMO INJECTION 100 MG/20ML
200.0000 mg | Freq: Once | INTRAVENOUS | Status: AC
  Administered 2019-04-05: 11:00:00 200 mg via INTRAVENOUS
  Filled 2019-04-05: qty 40

## 2019-04-05 MED ORDER — ATROPINE SULFATE 1 MG/ML IJ SOLN: 0.5000 mg | Freq: Once | INTRAMUSCULAR | Status: DC | PRN

## 2019-04-05 MED ORDER — DEXTROSE 5 % IV SOLN
Freq: Once | INTRAVENOUS | Status: AC
  Administered 2019-04-05: 10:00:00 via INTRAVENOUS
  Filled 2019-04-05: qty 250

## 2019-04-05 MED ORDER — PALONOSETRON HCL INJECTION 0.25 MG/5ML
INTRAVENOUS | Status: AC
  Filled 2019-04-05: qty 5

## 2019-04-05 MED ORDER — ATROPINE SULFATE 1 MG/ML IJ SOLN: 0.4000 mg | Freq: Once | INTRAMUSCULAR | Status: DC

## 2019-04-05 MED ORDER — LEUCOVORIN CALCIUM INJECTION 350 MG
300.0000 mg/m2 | Freq: Once | INTRAVENOUS | Status: AC
  Administered 2019-04-05: 13:00:00 670 mg via INTRAVENOUS
  Filled 2019-04-05: qty 33.5

## 2019-04-05 MED ORDER — PALONOSETRON HCL INJECTION 0.25 MG/5ML
0.2500 mg | Freq: Once | INTRAVENOUS | Status: AC
  Administered 2019-04-05: 10:00:00 0.25 mg via INTRAVENOUS

## 2019-04-05 MED ORDER — SODIUM CHLORIDE 0.9 % IV SOLN
Freq: Once | INTRAVENOUS | Status: AC
  Administered 2019-04-05: 10:00:00 via INTRAVENOUS
  Filled 2019-04-05: qty 5

## 2019-04-05 MED ORDER — SODIUM CHLORIDE 0.9 % IV SOLN
1800.0000 mg/m2 | INTRAVENOUS | Status: DC
  Administered 2019-04-05: 15:00:00 4000 mg via INTRAVENOUS
  Filled 2019-04-05: qty 80

## 2019-04-05 MED ORDER — IRINOTECAN HCL CHEMO INJECTION 100 MG/5ML
120.0000 mg/m2 | Freq: Once | INTRAVENOUS | Status: AC
  Administered 2019-04-05: 13:00:00 260 mg via INTRAVENOUS
  Filled 2019-04-05: qty 13

## 2019-04-05 NOTE — Patient Instructions (Addendum)
[place] Discharge Instructions for Patients Receiving Chemotherapy  Today you received the following chemotherapy agents: Oxaliplatin, Leucovorin, Irinotecan, Fluorouracil  To help prevent nausea and vomiting after your treatment, we encourage you to take your nausea medication as directed by your MD. Take Compazine at home if nausea in the next 3 days as Zofran will not be effective with the anti nausea pre medication given.   If you develop nausea and vomiting that is not controlled by your nausea medication, call the clinic.   BELOW ARE SYMPTOMS THAT SHOULD BE REPORTED IMMEDIATELY:  *FEVER GREATER THAN 100.5 F  *CHILLS WITH OR WITHOUT FEVER  NAUSEA AND VOMITING THAT IS NOT CONTROLLED WITH YOUR NAUSEA MEDICATION  *UNUSUAL SHORTNESS OF BREATH  *UNUSUAL BRUISING OR BLEEDING  TENDERNESS IN MOUTH AND THROAT WITH OR WITHOUT PRESENCE OF ULCERS  *URINARY PROBLEMS  *BOWEL PROBLEMS  UNUSUAL RASH Items with * indicate a potential emergency and should be followed up as soon as possible.  Feel free to call the clinic should you have any questions or concerns. The clinic phone number is [phone].  Please show the [place] at check-in to the Emergency Department and triage nurse.  Coronavirus (COVID-19) Are you at risk?  Are you at risk for the Coronavirus (COVID-19)?  To be considered HIGH RISK for Coronavirus (COVID-19), you have to meet the following criteria:  . Traveled to [place], [place], [place], [place] or [place]; or in the [place] to [place], [place], [place], or [place]; and have fever, cough, and shortness of breath within the last 2 weeks of travel OR . Been in close contact with a person diagnosed with COVID-19 within the last 2 weeks and have fever, cough, and shortness of breath . IF YOU DO NOT MEET THESE CRITERIA, YOU ARE CONSIDERED LOW RISK FOR COVID-19.  What to do if you are HIGH RISK for COVID-19?  Marland Kitchen If you are having  a medical emergency, call 911. . Seek medical care right away. Before you go to a doctor's office, urgent care or emergency department, call ahead and tell them about your recent travel, contact with someone diagnosed with COVID-19, and your symptoms. You should receive instructions from your physician's office regarding next steps of care.  . When you arrive at healthcare provider, tell the healthcare staff immediately you have returned from visiting [place], [place], [place], [place] or [place]; or traveled in the [place] to [place], [place], [place], or [place]; in the last two weeks or you have been in close contact with a person diagnosed with COVID-19 in the last 2 weeks.   . Tell the health care staff about your symptoms: fever, cough and shortness of breath. . After you have been seen by a medical provider, you will be either: o Tested for (COVID-19) and discharged home on quarantine except to seek medical care if symptoms worsen, and asked to  - Stay home and avoid contact with others until you get your results (4-5 days)  - Avoid travel on public transportation if possible (such as bus, train, or airplane) or o Sent to the Emergency Department by EMS for evaluation, COVID-19 testing, and possible admission depending on your condition and test results.  What to do if you are LOW RISK for COVID-19?  Reduce your risk of any infection by using the same precautions used for avoiding the common cold or flu:  Marland Kitchen Wash your hands often with soap and warm water  for at least 20 seconds.  If soap and water are not readily available, use an alcohol-based hand sanitizer with at least 60% alcohol.  . If coughing or sneezing, cover your mouth and nose by coughing or sneezing into the elbow areas of your shirt or coat, into a tissue or into your sleeve (not your hands). . Avoid shaking hands with others and consider head nods or verbal greetings only. . Avoid touching your eyes, nose, or mouth  with unwashed hands.  . Avoid close contact with people who are sick. . Avoid places or events with large numbers of people in one location, like concerts or sporting events. . Carefully consider travel plans you have or are making. . If you are planning any travel outside or inside the [place], visit the CDC's Travelers' Health webpage for the latest health notices. . If you have some symptoms but not all symptoms, continue to monitor at home and seek medical attention if your symptoms worsen. . If you are having a medical emergency, call 911.   [place] / e-Visit: [email or web address]         [facility]: [facility]: [phone]                   [facility]: [phone]

## 2019-04-05 NOTE — Telephone Encounter (Signed)
Scheduled appt per 6/2 los. °

## 2019-04-06 ENCOUNTER — Telehealth: Payer: Self-pay

## 2019-04-06 NOTE — Telephone Encounter (Signed)
TC to pt per Lattie Haw to let him know the CA-19-9 is further improved. Patient verbalized understanding. Patient also asked if I could get Lattie Haw to give him a call so that someone could further explain to him what the actual numbers mean. Lattie Haw aware of patient concern and call request. No further problems or concerns at this time.

## 2019-04-07 ENCOUNTER — Inpatient Hospital Stay: Payer: Managed Care, Other (non HMO)

## 2019-04-07 ENCOUNTER — Other Ambulatory Visit: Payer: Self-pay

## 2019-04-07 VITALS — BP 116/73 | HR 95 | Temp 99.1°F | Resp 18

## 2019-04-07 DIAGNOSIS — Z5111 Encounter for antineoplastic chemotherapy: Secondary | ICD-10-CM | POA: Diagnosis not present

## 2019-04-07 DIAGNOSIS — Z95828 Presence of other vascular implants and grafts: Secondary | ICD-10-CM

## 2019-04-07 MED ORDER — SODIUM CHLORIDE 0.9% FLUSH
10.0000 mL | INTRAVENOUS | Status: DC | PRN
  Administered 2019-04-07: 10 mL
  Filled 2019-04-07: qty 10

## 2019-04-07 MED ORDER — HEPARIN SOD (PORK) LOCK FLUSH 100 UNIT/ML IV SOLN
500.0000 [IU] | Freq: Once | INTRAVENOUS | Status: AC | PRN
  Administered 2019-04-07: 12:00:00 500 [IU]
  Filled 2019-04-07: qty 5

## 2019-04-07 NOTE — Patient Instructions (Signed)

## 2019-04-09 ENCOUNTER — Encounter: Payer: Self-pay | Admitting: Nurse Practitioner

## 2019-04-10 ENCOUNTER — Encounter: Payer: Self-pay | Admitting: Nurse Practitioner

## 2019-04-10 ENCOUNTER — Other Ambulatory Visit: Payer: Self-pay | Admitting: Nurse Practitioner

## 2019-04-10 ENCOUNTER — Telehealth: Payer: Self-pay

## 2019-04-10 DIAGNOSIS — C250 Malignant neoplasm of head of pancreas: Secondary | ICD-10-CM

## 2019-04-10 MED ORDER — FLUCONAZOLE 100 MG PO TABS: 100.0000 mg | ORAL_TABLET | Freq: Every day | ORAL | 0 refills | Status: DC

## 2019-04-10 MED ORDER — ONDANSETRON HCL 8 MG PO TABS: 8.0000 mg | ORAL_TABLET | Freq: Three times a day (TID) | ORAL | 0 refills | Status: DC | PRN

## 2019-04-10 NOTE — Telephone Encounter (Signed)
Spoke with Pt. Advised per [name] NP. Diflucan and Zofran was sent to the pharmacy. Pt verbalized understanding.

## 2019-04-13 ENCOUNTER — Telehealth: Payer: Self-pay | Admitting: Genetic Counselor

## 2019-04-13 NOTE — Telephone Encounter (Signed)
Called patient regarding upcoming [facility] appointment, patient is notified and e-mail has been sent. °

## 2019-04-14 ENCOUNTER — Other Ambulatory Visit: Payer: Self-pay | Admitting: *Deleted

## 2019-04-14 ENCOUNTER — Telehealth: Payer: Self-pay | Admitting: *Deleted

## 2019-04-14 MED ORDER — PROCHLORPERAZINE MALEATE 10 MG PO TABS: 10.0000 mg | ORAL_TABLET | Freq: Four times a day (QID) | ORAL | 1 refills | Status: DC | PRN

## 2019-04-14 NOTE — Telephone Encounter (Signed)
Called to request to move his OV/chemo 6/16 to 04/24/19. Had a very difficult time with nausea, heartburn and extreme fatigue with last treatment and does not feel he can physically tolerate or his counts will be adequate for treatment next week. Has started taking Pepcid OTC twice daily w/improvement in heartburn/reflux. Diarrhea as recently started and he will begin taking Imodium now. Blood sugars have been OK, despite minimal po intake. Requested refill on compazine.

## 2019-04-15 ENCOUNTER — Other Ambulatory Visit: Payer: Self-pay | Admitting: Oncology

## 2019-04-17 ENCOUNTER — Telehealth: Payer: Self-pay | Admitting: *Deleted

## 2019-04-17 NOTE — Telephone Encounter (Signed)
Per Dr. Benay Spice: OK to move appointments from 6/16 to 6/22. He also reports diarrhea is better now. Scheduling message sent.

## 2019-04-18 ENCOUNTER — Inpatient Hospital Stay: Payer: Managed Care, Other (non HMO)

## 2019-04-18 ENCOUNTER — Other Ambulatory Visit: Payer: Self-pay | Admitting: Genetic Counselor

## 2019-04-18 ENCOUNTER — Inpatient Hospital Stay: Payer: Managed Care, Other (non HMO) | Admitting: Nurse Practitioner

## 2019-04-18 ENCOUNTER — Telehealth: Payer: Self-pay | Admitting: Genetic Counselor

## 2019-04-18 ENCOUNTER — Encounter: Payer: Self-pay | Admitting: Genetic Counselor

## 2019-04-18 DIAGNOSIS — K8689 Other specified diseases of pancreas: Secondary | ICD-10-CM

## 2019-04-18 DIAGNOSIS — C250 Malignant neoplasm of head of pancreas: Secondary | ICD-10-CM

## 2019-04-18 NOTE — Telephone Encounter (Signed)
Left message on answer machine to remind about appt.

## 2019-04-19 ENCOUNTER — Other Ambulatory Visit: Payer: Self-pay | Admitting: Genetic Counselor

## 2019-04-19 ENCOUNTER — Encounter: Payer: Self-pay | Admitting: Genetic Counselor

## 2019-04-19 ENCOUNTER — Other Ambulatory Visit: Payer: Managed Care, Other (non HMO)

## 2019-04-19 ENCOUNTER — Inpatient Hospital Stay (HOSPITAL_BASED_OUTPATIENT_CLINIC_OR_DEPARTMENT_OTHER): Payer: Managed Care, Other (non HMO) | Admitting: Genetic Counselor

## 2019-04-19 ENCOUNTER — Telehealth: Payer: Self-pay

## 2019-04-19 DIAGNOSIS — C250 Malignant neoplasm of head of pancreas: Secondary | ICD-10-CM

## 2019-04-19 DIAGNOSIS — Z808 Family history of malignant neoplasm of other organs or systems: Secondary | ICD-10-CM | POA: Diagnosis not present

## 2019-04-19 DIAGNOSIS — Z803 Family history of malignant neoplasm of breast: Secondary | ICD-10-CM | POA: Insufficient documentation

## 2019-04-19 NOTE — Telephone Encounter (Signed)
Spoke with pt in regards to extreme fatigue. Pt states that he has had fatigue and feeling lightheaded. Pt states that his hemagloblin was low and is still having fatigue. Pt states that fatigue is not relieved with rest. Pt states that he rescheduled his infusion to Monday 6/22 due to fatigue. Pt would like to know if there is anything that he can do to relieve the fatigue. Advised that I will speak to Dr. Benay Spice and give pt a call back. [phone]

## 2019-04-19 NOTE — Progress Notes (Addendum)
REFERRING PROVIDER: Ladell Pier, MD [address] [place],  [place] [address]  PRIMARY PROVIDER:  Cari Caraway, MD  PRIMARY REASON FOR VISIT:  1. 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 Family history of melanoma   3. Family history of breast cancer      HISTORY OF PRESENT ILLNESS:    I connected with Brian Torres on 04/19/2019 at 2 PM EDT by [facility] video conference and verified that I am speaking with the correct person using two identifiers.   Patient location: Home Provider location: Office  Brian Torres, a 58 y.o. male, was seen for a [place] cancer genetics consultation at the request of Dr. Benay Spice due to a personal and family history of cancer.  Brian Torres presents to clinic today to discuss the possibility of a hereditary predisposition to cancer, genetic testing, and to further clarify his future cancer risks, as well as potential cancer risks for family members.   In March 2020, at the age of 85, Brian Torres was diagnosed with cancer at the head of the pancreas. The treatment plan includes Chemotherapy and surgery.     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[place])  01/26/2019 Initial Diagnosis   Cancer of head of pancreas ([place])   01/26/2019 Cancer Staging   Staging form: Exocrine Pancreas, AJCC 8th Edition - Clinical: Stage Unknown (cTX, cN0, cM0) - Signed by Ladell Pier, MD on 01/26/2019   02/06/2019 -  Chemotherapy   The patient had palonosetron (ALOXI) injection 0.25 mg, 0.25 mg, Intravenous,  Once, 5 of 7 cycles Administration: 0.25 mg (02/06/2019), 0.25 mg (02/20/2019), 0.25 mg (03/09/2019), 0.25 mg (03/21/2019), 0.25 mg (04/05/2019) pegfilgrastim-cbqv (UDENYCA) injection 6 mg, 6 mg, Subcutaneous, Once, 2 of 4 cycles Administration: 6 mg (02/22/2019) irinotecan (CAMPTOSAR) 340 mg in dextrose 5 % 500 mL chemo infusion, 150 mg/m2 = 340 mg (100 % of original dose 150 mg/m2), Intravenous,  Once, 5 of 7 cycles Dose modification: 150 mg/m2  (original dose 150 mg/m2, Cycle 1, Reason: Provider Judgment), 120 mg/m2 (original dose 150 mg/m2, Cycle 3, Reason: Provider Judgment) Administration: 340 mg (02/06/2019), 340 mg (02/20/2019), 260 mg (03/09/2019), 260 mg (03/21/2019), 260 mg (04/05/2019) leucovorin 892 mg in dextrose 5 % 250 mL infusion, 400 mg/m2 = 892 mg, Intravenous,  Once, 5 of 7 cycles Dose modification: 300 mg/m2 (original dose 400 mg/m2, Cycle 3, Reason: Provider Judgment) Administration: 892 mg (02/06/2019), 892 mg (02/20/2019), 670 mg (03/21/2019), 670 mg (03/09/2019), 670 mg (04/05/2019) oxaliplatin (ELOXATIN) 200 mg in dextrose 5 % 500 mL chemo infusion, 190 mg, Intravenous,  Once, 5 of 7 cycles Administration: 200 mg (02/06/2019), 200 mg (02/20/2019), 200 mg (03/09/2019), 200 mg (03/21/2019), 200 mg (04/05/2019) fosaprepitant (EMEND) 150 mg, dexamethasone (DECADRON) 10 mg in sodium chloride 0.9 % 145 mL IVPB, , Intravenous,  Once, 5 of 7 cycles Administration:  (02/06/2019),  (02/20/2019),  (03/09/2019),  (03/21/2019),  (04/05/2019) fluorouracil (ADRUCIL) 5,350 mg in sodium chloride 0.9 % 143 mL chemo infusion, 2,400 mg/m2 = 5,350 mg, Intravenous, 1 Day/Dose, 5 of 7 cycles Dose modification: 1,800 mg/m2 (original dose 2,400 mg/m2, Cycle 3, Reason: Provider Judgment) Administration: 5,350 mg (02/06/2019), 5,350 mg (02/20/2019), 4,000 mg (03/09/2019), 4,000 mg (03/21/2019), 4,000 mg (04/05/2019)  for chemotherapy treatment.       Past Medical History:  Diagnosis Date  . Cancer ([place]) 12/2018   pancreatic cancer  . Depression   . Diabetes mellitus without complication ([place])    type 2  .  Dyslipidemia   . Erectile dysfunction   . Family history of breast cancer   . Family history of melanoma   . Hypertension    white coat syndrome  . Nicotine addiction     Past Surgical History:  Procedure Laterality Date  . BILIARY STENT PLACEMENT N/A 12/21/2018   Procedure: BILIARY STENT PLACEMENT;  Surgeon: Clarene Essex, MD;  [facility]: [facility];  Service:  Endoscopy;  Laterality: N/A;  . BILIARY STENT PLACEMENT N/A 01/24/2019   Procedure: BILIARY STENT PLACEMENT;  Surgeon: Arta Silence, MD;  [facility];  Service: Endoscopy;  Laterality: N/A;  . BIOPSY  12/21/2018   Procedure: BIOPSY;  Surgeon: Clarene Essex, MD;  [facility];  Service: Endoscopy;;  . ENDOSCOPIC RETROGRADE CHOLANGIOPANCREATOGRAPHY (ERCP) WITH PROPOFOL N/A 12/21/2018   Procedure: ENDOSCOPIC RETROGRADE CHOLANGIOPANCREATOGRAPHY (ERCP) WITH PROPOFOL;  Surgeon: Clarene Essex, MD;  [facility];  Service: Endoscopy;  Laterality: N/A;  . ENDOSCOPIC RETROGRADE CHOLANGIOPANCREATOGRAPHY (ERCP) WITH PROPOFOL N/A 01/24/2019   Procedure: ENDOSCOPIC RETROGRADE CHOLANGIOPANCREATOGRAPHY (ERCP) WITH PROPOFOL;  Surgeon: Arta Silence, MD;  [facility];  Service: Endoscopy;  Laterality: N/A;  Balloon Sweep of Duct  . ESOPHAGOGASTRODUODENOSCOPY N/A 01/03/2019   Procedure: ESOPHAGOGASTRODUODENOSCOPY (EGD);  Surgeon: Arta Silence, MD;  [name];  Service: Endoscopy;  Laterality: N/A;  . ESOPHAGOGASTRODUODENOSCOPY (EGD) WITH PROPOFOL N/A 01/24/2019   Procedure: ESOPHAGOGASTRODUODENOSCOPY (EGD) WITH PROPOFOL;  Surgeon: Arta Silence, MD;  [facility];  Service: Endoscopy;  Laterality: N/A;  stent removal  . EUS N/A 01/03/2019   Procedure: FULL UPPER ENDOSCOPIC ULTRASOUND (EUS) Linear not radial;  Surgeon: Arta Silence, MD;  [facility];  Service: Endoscopy;  Laterality: N/A;  . EUS N/A 01/24/2019   Procedure: FULL UPPER ENDOSCOPIC ULTRASOUND (EUS) RADIAL;  Surgeon: Arta Silence, MD;  [facility];  Service: Endoscopy;  Laterality: N/A;  . FINE NEEDLE ASPIRATION N/A 01/03/2019   Procedure: FINE NEEDLE ASPIRATION (FNA) LINEAR;  Surgeon: Arta Silence, MD;  [facility];  Service: Endoscopy;  Laterality: N/A;  . FINE NEEDLE ASPIRATION N/A 01/24/2019   Procedure: FINE NEEDLE ASPIRATION (FNA) LINEAR;  Surgeon:  Arta Silence, MD;  [facility];  Service: Endoscopy;  Laterality: N/A;  . LOWER EXTREMITY ANGIOGRAPHY N/A 10/18/2017   Procedure: LOWER EXTREMITY ANGIOGRAPHY - IVUS - LYSIS CATH;  Surgeon: Waynetta Sandy, MD;  Location: [facility];  Service: Cardiovascular;  Laterality: N/A;  . LOWER EXTREMITY ANGIOGRAPHY N/A 10/19/2017   Procedure: LOWER EXTREMITY ANGIOGRAPHY - LYSIS RECHECK;  Surgeon: Waynetta Sandy, MD;  Location: [facility];  Service: Cardiovascular;  Laterality: N/A;  . PANCREATIC STENT PLACEMENT  12/21/2018   Procedure: PANCREATIC STENT PLACEMENT;  Surgeon: Clarene Essex, MD;  [facility];  Service: Endoscopy;;  . PERIPHERAL VASCULAR BALLOON ANGIOPLASTY Left 10/19/2017   Procedure: PERIPHERAL VASCULAR BALLOON ANGIOPLASTY;  Surgeon: Waynetta Sandy, MD;  Location: [facility];  Service: Cardiovascular;  Laterality: Left;  Multiple Lower extremity venous sites  . PERIPHERAL VASCULAR INTERVENTION Left 10/18/2017   Procedure: PERIPHERAL VASCULAR INTERVENTION;  Surgeon: Waynetta Sandy, MD;  Location: [facility];  Service: Cardiovascular;  Laterality: Left;  . PORTACATH PLACEMENT Left 02/02/2019   Procedure: INSERTION PORT-A-CATH WITH POSSIBLE ULTRASOUND;  Surgeon: [name], MD;  Location: [facility];  Service: General;  Laterality: Left;  . REMOVAL OF STONES  12/21/2018   Procedure: REMOVAL OF STONES;  Surgeon: Clarene Essex, MD;  [facility];  Service: Endoscopy;;  . Joan Mayans  12/21/2018   Procedure: Joan Mayans;  Surgeon: Clarene Essex, MD;  [name];  Service: Endoscopy;;  . STENT REMOVAL  01/24/2019   Procedure: STENT REMOVAL;  Surgeon: Arta Silence, MD;  [facility];  Service: Endoscopy;;    Social History   Socioeconomic History  . Marital status: Married    Spouse name: Not on file  . Number of children: Not on file  . Years of  education: Not on file  . Highest education level: Not on file  Occupational History  . Not on file  Social Needs  . Financial resource strain: Not on file  . Food insecurity    Worry: Not on file    Inability: Not on file  . Transportation needs    Medical: Not on file    Non-medical: Not on file  Tobacco Use  . Smoking status: Current Every Day Smoker    Packs/day: 1.00    Types: Cigarettes  . Smokeless tobacco: Never Used  Substance and Sexual Activity  . Alcohol use: No    Frequency: Never  . Drug use: No  . Sexual activity: Not on file  Lifestyle  . Physical activity    Days per week: Not on file    Minutes per session: Not on file  . Stress: Not on file  Relationships  . Social Herbalist on phone: Not on file    Gets together: Not on file    Attends religious service: Not on file    Active member of club or organization: Not on file    Attends meetings of clubs or organizations: Not on file    Relationship status: Not on file  Other Topics Concern  . Not on file  Social History Narrative  . Not on file     FAMILY HISTORY:  We obtained a detailed, 4-generation family history.  Significant diagnoses are listed below: Family History  Problem Relation Age of Onset  . Diabetes Mother   . Diabetes Father   . CAD Father   . Melanoma Father   . Breast cancer Paternal Aunt 56  . COPD Maternal Aunt   . Melanoma Paternal Uncle   . Stroke Maternal Grandmother   . Tuberculosis Paternal Grandmother   . Cancer Neg Hx     The patient has a son and two daughters who are cancer free.  He has two brothers who do not have cancer. His mother is living and his father is deceased.  The patients mother has diabetes but is otherwise healthy. She had one sister who died of COPD. Her parents are both deceased from non-cancer related issues.  The patient's father died at 109.  He had a history of skin cancer and melanoma. He had four brothers and two sisters.  One  sister had breast cancer at 71 and one brother had melanoma.  The paternal grandparents are both deceased.  Brian Torres is unaware of previous family history of genetic testing for hereditary cancer risks. Patient's maternal ancestors are of caucasian descent, and paternal ancestors are of caucasian descent. There is no reported Ashkenazi Jewish ancestry. There is no known consanguinity.    GENETIC COUNSELING ASSESSMENT: Brian Torres is a 58 y.o. male with a personal and family history of cancer which is somewhat suggestive of a hereditary cancer syndrome and predisposition to cancer. We, therefore, discussed and recommended the following at today's visit.   DISCUSSION: We discussed that 5 - 10% of pancreatic cancer is hereditary, with  most cases associated with BRCA mutations.  There are other genes that can be associated with hereditary pancreatic cancer syndromes.  These include CDKN2A and the Lynch syndrome genes. Based on his family history we discussed the increased risk for hereditary breast cancer genes, such as ATM and BRCA mutations, but also a risk for melanoma-pancreatic cancer genes such as CDKN2A and CDK4 mutations. We discussed that testing is beneficial for several reasons including knowing how to follow individuals after completing their treatment, identifying whether potential treatment options such as PARP inhibitors would be beneficial, and understand if other family members could be at risk for cancer and allow them to undergo genetic testing.   We reviewed the characteristics, features and inheritance patterns of hereditary cancer syndromes. We also discussed genetic testing, including the appropriate family members to test, the process of testing, insurance coverage and turn-around-time for results. We discussed the implications of a negative, positive and/or variant of uncertain significant result. We recommended Brian Torres pursue genetic testing for the CustomNext-Cancer+RNAinsight gene  panel. The CustomNext-Expanded gene panel offered by [facility] and includes sequencing and rearrangement analysis for the following 81 genes: AIP, ALK, APC*, ATM*, AXIN2, BAP1, BARD1, BLM, BMPR1A, BRCA1*, BRCA2*, BRIP1*, CDC73, CDH1*, CDK4, CDKN1B, CDKN2A, CHEK2*, CTNNA1, DICER1, FANCC, FH, FLCN, GALNT12, HOXB13, KIT, MAX, MEN1, MET, MLH1*, MRE11A, MSH2*, MSH6*, MUTYH*, NBN, NF1*, NF2, NTHL1, PALB2*, PDGFRA, PHOX2B, PMS2*, POLD1, POLE, POT1, PRKAR1A, PTCH1, PTEN*, RAD50, RAD51C*, RAD51D*, RB1, RET, SDHA, SDHAF2, SDHB, SDHC, SDHD, SMAD4, SMARCA4, SMARCB1, SMARCE1, STK11, SUFU, TMEM127, TP53*, TSC1, TSC2, VHL and XRCC2 (sequencing and deletion/duplication); CASR, CFTR, CPA1, CTRC, EGFR, MITF, PRSS1 and SPINK1 (sequencing only); EPCAM and GREM1 (deletion/duplication only). DNA and RNA analyses performed for * genes.   Based on Brian Torres's personal and family history of cancer, he meets medical criteria for genetic testing. Despite that he meets criteria, he may still have an out of pocket cost. We discussed that if his out of pocket cost for testing is over $100, the laboratory will call and confirm whether he wants to proceed with testing.  If the out of pocket cost of testing is less than $100 he will be billed by the genetic testing laboratory.   PLAN: After considering the risks, benefits, and limitations, Brian Torres provided informed consent to pursue genetic testing and a blood sample will be sent to [facility] for analysis of the CustomNext-Cancer panel. Results should be available within approximately 2-3 weeks' time, at which point they will be disclosed by telephone to Brian Torres, as will any additional recommendations warranted by these results. Brian Torres will receive a summary of his genetic counseling visit and a copy of his results once available. This information will also be available in Epic.   Lastly, we encouraged Brian Torres to remain in contact with cancer genetics annually  so that we can continuously update the family history and inform him of any changes in cancer genetics and testing that may be of benefit for this family.   Brian Torres questions were answered to his satisfaction today. Our contact information was provided should additional questions or concerns arise. Thank you for the referral and allowing [place] to share in the care of your patient.   Petrona Wyeth P. Florene [place], [place], [facility] Certified Genetic Counselor [email or web address] phone: [phone]  The patient was seen for a total of 45 minutes in face-to-face genetic counseling.  This patient was discussed with Drs. Magrinat, Lindi Adie and/or Burr Medico who agrees with the above.    _______________________________________________________________________ For  Office Staff:  Number of people involved in session: 1 Was an Intern/ student involved with case: no

## 2019-04-19 NOTE — Telephone Encounter (Signed)
Spoke with pt in regards to feeling fatigue and light headed. Advised per [name] to drink fluids and make sure blood sugar numbers are under controlled and make sure pt was not having and diarrhea or bleeding. Pt states that he is drinking more fluids than he usually drinks and has been controlling his blood sugar numbers. Pt states that he has not been having any issues with diarrhea or bleeding. Advised that is pt does have any bleeding to call office. Pt has an appt scheduled for 6/22.

## 2019-04-20 ENCOUNTER — Other Ambulatory Visit: Payer: Self-pay | Admitting: Nurse Practitioner

## 2019-04-20 ENCOUNTER — Telehealth: Payer: Self-pay | Admitting: Oncology

## 2019-04-20 DIAGNOSIS — C250 Malignant neoplasm of head of pancreas: Secondary | ICD-10-CM

## 2019-04-20 NOTE — Telephone Encounter (Signed)
Pt requesting refill. Last filled on 04/10/19 for # 20

## 2019-04-20 NOTE — Telephone Encounter (Signed)
Spoke with patient re 6/22 appointments.

## 2019-04-20 NOTE — Telephone Encounter (Signed)
Pt requesting refill. Last filled 03/15/19

## 2019-04-21 ENCOUNTER — Other Ambulatory Visit: Payer: Self-pay | Admitting: Nurse Practitioner

## 2019-04-21 DIAGNOSIS — C250 Malignant neoplasm of head of pancreas: Secondary | ICD-10-CM

## 2019-04-21 MED ORDER — LORAZEPAM 0.5 MG PO TABS: 0.5000 mg | ORAL_TABLET | Freq: Three times a day (TID) | ORAL | 0 refills | Status: DC | PRN

## 2019-04-21 MED ORDER — ONDANSETRON HCL 8 MG PO TABS: 8.0000 mg | ORAL_TABLET | Freq: Three times a day (TID) | ORAL | 1 refills | Status: DC | PRN

## 2019-04-24 ENCOUNTER — Inpatient Hospital Stay: Payer: Managed Care, Other (non HMO)

## 2019-04-24 ENCOUNTER — Inpatient Hospital Stay (HOSPITAL_BASED_OUTPATIENT_CLINIC_OR_DEPARTMENT_OTHER): Payer: Managed Care, Other (non HMO) | Admitting: Nurse Practitioner

## 2019-04-24 ENCOUNTER — Other Ambulatory Visit: Payer: Self-pay

## 2019-04-24 ENCOUNTER — Encounter: Payer: Self-pay | Admitting: Nurse Practitioner

## 2019-04-24 VITALS — BP 111/74 | HR 103 | Temp 98.4°F | Resp 18 | Ht 76.0 in | Wt 187.8 lb

## 2019-04-24 VITALS — HR 96

## 2019-04-24 DIAGNOSIS — Z86718 Personal history of other venous thrombosis and embolism: Secondary | ICD-10-CM

## 2019-04-24 DIAGNOSIS — C250 Malignant neoplasm of head of pancreas: Secondary | ICD-10-CM | POA: Diagnosis not present

## 2019-04-24 DIAGNOSIS — Z5111 Encounter for antineoplastic chemotherapy: Secondary | ICD-10-CM | POA: Diagnosis not present

## 2019-04-24 DIAGNOSIS — Z7901 Long term (current) use of anticoagulants: Secondary | ICD-10-CM

## 2019-04-24 DIAGNOSIS — I129 Hypertensive chronic kidney disease with stage 1 through stage 4 chronic kidney disease, or unspecified chronic kidney disease: Secondary | ICD-10-CM | POA: Diagnosis not present

## 2019-04-24 DIAGNOSIS — Z95828 Presence of other vascular implants and grafts: Secondary | ICD-10-CM

## 2019-04-24 DIAGNOSIS — N189 Chronic kidney disease, unspecified: Secondary | ICD-10-CM | POA: Diagnosis not present

## 2019-04-24 DIAGNOSIS — E1122 Type 2 diabetes mellitus with diabetic chronic kidney disease: Secondary | ICD-10-CM

## 2019-04-24 LAB — CMP (CANCER CENTER ONLY)
ALT: 29 U/L (ref 0–44)
AST: 19 U/L (ref 15–41)
Albumin: 2.9 g/dL — ABNORMAL LOW (ref 3.5–5.0)
Alkaline Phosphatase: 155 U/L — ABNORMAL HIGH (ref 38–126)
Anion gap: 9 (ref 5–15)
BUN: 12 mg/dL (ref 6–20)
CO2: 25 mmol/L (ref 22–32)
Calcium: 8.8 mg/dL — ABNORMAL LOW (ref 8.9–10.3)
Chloride: 99 mmol/L (ref 98–111)
Creatinine: 1.01 mg/dL (ref 0.61–1.24)
GFR, Est AFR Am: >60 mL/min (ref >60)
GFR, Estimated: >60 mL/min (ref >60)
Glucose, Bld: 161 mg/dL — ABNORMAL HIGH (ref 70–99)
Potassium: 4.7 mmol/L (ref 3.5–5.1)
Sodium: 133 mmol/L — ABNORMAL LOW (ref 135–145)
Total Bilirubin: 0.2 mg/dL — ABNORMAL LOW (ref 0.3–1.2)
Total Protein: 6.9 g/dL (ref 6.5–8.1)

## 2019-04-24 LAB — CBC WITH DIFFERENTIAL (CANCER CENTER ONLY)
Abs Immature Granulocytes: 0.36 10*3/uL — ABNORMAL HIGH (ref 0.00–0.07)
Basophils Absolute: 0.1 10*3/uL (ref 0.0–0.1)
Basophils Relative: 2 %
Eosinophils Absolute: 0.1 10*3/uL (ref 0.0–0.5)
Eosinophils Relative: 1 %
HCT: 31.3 % — ABNORMAL LOW (ref 39.0–52.0)
Hemoglobin: 10.1 g/dL — ABNORMAL LOW (ref 13.0–17.0)
Immature Granulocytes: 6 %
Lymphocytes Relative: 32 %
Lymphs Abs: 2 10*3/uL (ref 0.7–4.0)
MCH: 29.5 pg (ref 26.0–34.0)
MCHC: 32.3 g/dL (ref 30.0–36.0)
MCV: 91.5 fL (ref 80.0–100.0)
Monocytes Absolute: 0.7 10*3/uL (ref 0.1–1.0)
Monocytes Relative: 11 %
Neutro Abs: 3 10*3/uL (ref 1.7–7.7)
Neutrophils Relative %: 48 %
Platelet Count: 371 10*3/uL (ref 150–400)
RBC: 3.42 MIL/uL — ABNORMAL LOW (ref 4.22–5.81)
RDW: 16.2 % — ABNORMAL HIGH (ref 11.5–15.5)
WBC Count: 6.2 10*3/uL (ref 4.0–10.5)
nRBC: 0 % (ref 0.0–0.2)

## 2019-04-24 MED ORDER — SODIUM CHLORIDE 0.9% FLUSH
10.0000 mL | INTRAVENOUS | Status: DC | PRN
  Administered 2019-04-24: 11:00:00 10 mL
  Filled 2019-04-24: qty 10

## 2019-04-24 MED ORDER — PALONOSETRON HCL INJECTION 0.25 MG/5ML
INTRAVENOUS | Status: AC
  Filled 2019-04-24: qty 5

## 2019-04-24 MED ORDER — SODIUM CHLORIDE 0.9 % IV SOLN
Freq: Once | INTRAVENOUS | Status: AC
  Administered 2019-04-24: 13:00:00 via INTRAVENOUS
  Filled 2019-04-24: qty 5

## 2019-04-24 MED ORDER — SODIUM CHLORIDE 0.9 % IV SOLN
300.0000 mg/m2 | Freq: Once | INTRAVENOUS | Status: AC
  Administered 2019-04-24: 642 mg via INTRAVENOUS
  Filled 2019-04-24: qty 32.1

## 2019-04-24 MED ORDER — OXALIPLATIN CHEMO INJECTION 100 MG/20ML
65.0000 mg/m2 | Freq: Once | INTRAVENOUS | Status: AC
  Administered 2019-04-24: 140 mg via INTRAVENOUS
  Filled 2019-04-24: qty 20

## 2019-04-24 MED ORDER — DEXTROSE 5 % IV SOLN
Freq: Once | INTRAVENOUS | Status: DC
  Filled 2019-04-24: qty 250

## 2019-04-24 MED ORDER — SODIUM CHLORIDE 0.9 % IV SOLN
1800.0000 mg/m2 | INTRAVENOUS | Status: DC
  Administered 2019-04-24: 3850 mg via INTRAVENOUS
  Filled 2019-04-24: qty 77

## 2019-04-24 MED ORDER — SODIUM CHLORIDE 0.9% FLUSH
10.0000 mL | INTRAVENOUS | Status: DC | PRN
  Administered 2019-04-24: 10 mL
  Filled 2019-04-24: qty 10

## 2019-04-24 MED ORDER — DEXTROSE 5 % IV SOLN
Freq: Once | INTRAVENOUS | Status: AC
  Administered 2019-04-24: 12:00:00 via INTRAVENOUS
  Filled 2019-04-24: qty 250

## 2019-04-24 MED ORDER — SODIUM CHLORIDE 0.9 % IV SOLN
120.0000 mg/m2 | Freq: Once | INTRAVENOUS | Status: AC
  Administered 2019-04-24: 260 mg via INTRAVENOUS
  Filled 2019-04-24: qty 13

## 2019-04-24 MED ORDER — HEPARIN SOD (PORK) LOCK FLUSH 100 UNIT/ML IV SOLN
500.0000 [IU] | Freq: Once | INTRAVENOUS | Status: DC | PRN
  Filled 2019-04-24: qty 5

## 2019-04-24 MED ORDER — ATROPINE SULFATE 1 MG/ML IJ SOLN: 0.5000 mg | Freq: Once | INTRAMUSCULAR | Status: DC | PRN

## 2019-04-24 MED ORDER — PALONOSETRON HCL INJECTION 0.25 MG/5ML
0.2500 mg | Freq: Once | INTRAVENOUS | Status: AC
  Administered 2019-04-24: 0.25 mg via INTRAVENOUS

## 2019-04-24 NOTE — Progress Notes (Signed)
[place] OFFICE PROGRESS NOTE   Diagnosis: Pancreas cancer  INTERVAL HISTORY:   Mr. Olguin completed cycle 5 FOLFIRINOX 04/05/2019.  He contacted the office 04/14/2019 requesting cycle 6 be changed from 2 weeks to 3 weeks due to a difficult time following cycle 5.  He had severe nausea beginning day 1.  The nausea lasted for 2 weeks.  He tried Zofran, Compazine and Ativan with no significant relief.  He also had indigestion.  The indigestion improved recently with the start of Pepcid.  Energy level has been poor.  He notes an alteration in taste.  No mouth sores.  Right hand with stable numbness and tingling.  He had a couple of "sessions" of diarrhea.  This was relieved with Imodium.  The past 3 days he has felt better.  Objective:  Vital signs in last 24 hours:  Blood pressure 111/74, pulse (!) 103, temperature 98.4 F (36.9 C), temperature source Oral, resp. rate 18, height 6\' 4"  (1.93 m), weight 187 lb 12.8 oz (85.2 kg), SpO2 100 %.    HEENT: White coating over tongue.  No buccal thrush.  No ulcers. GI: Abdomen soft and nontender.  No hepatomegaly.  No mass. Vascular: No edema.  Left lower leg is larger than the right lower leg. Neuro: Vibratory sense mildly to moderately decreased over the fingertips per tuning fork exam. Skin: Palms without erythema. Port-A-Cath without erythema.   Lab Results:  Lab Results  Component Value Date   WBC 6.2 04/24/2019   HGB 10.1 (L) 04/24/2019   HCT 31.3 (L) 04/24/2019   MCV 91.5 04/24/2019   PLT 371 04/24/2019   NEUTROABS PENDING 04/24/2019    Imaging:  No results found.  Medications: I have reviewed the patient's current medications.  Assessment/Plan: 1. Pancreas head mass  Presented with jaundice, bilirubin elevated at 21.4  CTs abdomen/pelvis 12/20/2018-severe intrahepatic and extrahepatic biliary dilatation due to a possible 3.5 x 2.5 cm solid mass in the pancreatic head. Pancreatic ductal dilatation noted as  well with multiple cystic lesions in the pancreatic tail and body.   CT chest 12/20/2018-occasional nonspecific small pulmonary nodules felt to likely be related to prior infection or inflammation.   ERCP by Dr. Watt Climes 12/21/2018. The major papilla was on the rim of a diverticulum. The minor papilla appeared to be bulging. A biopsy of the duodenum adjacent to the minor papilla was performed. Plastic stent was placed into the ventral pancreatic duct. Biliary sphincterotomy performed. Covered metal stent placed into the common bile duct. Bile duct brushings showed benign reactive/reparative changes. Biopsy of polypoid duodenal mucosa suggestive of nodular peptic duodenitis, negative for dysplasia or malignancy.   Upper EUS by Dr. Paulita Fujita on 01/03/2019 showed a few cystic lesions in the pancreatic body and pancreatic tail. A stent was visualized in the common bile duct. Mass identified in the pancreatic head with fine-needle aspiration performed. Lesion appeared to abut or potentially superficially invade the superior mesenteric vein. No lymphadenopathy. No involvement of the superior mesenteric artery or celiac artery. Pathology returned suspicious for malignancy. Dr. Paulita Fujita notes if pathology shows adenocarcinoma the lesion would be staged T3 N0 MX.  Repeat ERCP and placement of a metal bile duct stent 01/24/2019  EUS 01/24/2019, T3N0 pancreas head mass with abutment of the SMV, FNA of the pancreas mass revealed adenocarcinoma, cytology from the bile duct stent revealed adenocarcinoma  Cycle 1 FOLFIRINOX4/04/2019  Cycle 2 FOLFIRINOX4/20/2020, Udenyca added  Cycle 3 FOLFIRINOX 03/09/2019, Irinotecan and 5-fluorouracil dose reduced, Udenyca held  Cycle 4 FOLFIRINOX 03/21/2019-Udenyca held  Cycle 5 FOLFIRINOX 04/05/2019  Cycle 6 FOLFIRINOX 04/24/2019 (oxaliplatin dose reduced due to poor tolerance of chemotherapy)  2. Biliary obstruction status post stent placement 3. History of left  lower extremity DVT 2017, 2018-on Eliquis. 4. Type 2 diabetes 5. Hypertension 6. Chronic kidney disease 7. Tobacco use 8. Anemia, macrocytic. Transfused 2 units of blood 12/20/2018.On oral iron. Improved 02/01/2019 and 02/06/2019. 9. Port-A-Cath placement 02/02/2019, Dr. Barry Dienes 10. Right foot drop-likely secondary to peroneal nerve compression related to weight loss   Disposition: Mr. Printy appears stable.  He has completed 5 cycles of FOLFIRINOX.  He tolerated cycle 5 poorly with prolonged nausea. Oxaliplatin will be dose reduced with treatment today.  He will receive dexamethasone 5 mg IV x1 on the day of pump discontinuation.  We are referring him for restaging CTs in approximately 2 weeks.  We reviewed the CBC from today.  Counts are adequate for treatment.  He will return for a follow-up visit in approximately 3 weeks.  He will contact the office in the interim with any problems.  We specifically discussed poorly controlled nausea.  Patient seen with Dr. Benay Spice.    [name] ANP/GNP-BC   04/24/2019  11:19 AM  This was a shared visit with [name].  Mr. Bufkin and prolonged nausea following the last cycle of chemotherapy.  He has lost weight.  We adjusted the oxaliplatin dose with chemotherapy today.  Chemotherapy was adjusted to his current weight.  He will receive additional Decadron on day 3.  We counseled him to avoid concentrated sweets and monitor his blood sugar.  Mr. Lacasse will undergo restaging CTs after this cycle.  Julieanne Manson, MD

## 2019-04-24 NOTE — Patient Instructions (Signed)
[facility] Discharge Instructions for Patients Receiving Chemotherapy  Today you received the following chemotherapy agents: Oxaliplatin, Leucovorin, Irinotecan, Fluorouracil  To help prevent nausea and vomiting after your treatment, we encourage you to take your nausea medication as directed by your MD. Take Compazine at home if nausea in the next 3 days as Zofran will not be effective with the anti nausea pre medication given.   If you develop nausea and vomiting that is not controlled by your nausea medication, call the clinic.   BELOW ARE SYMPTOMS THAT SHOULD BE REPORTED IMMEDIATELY:  *FEVER GREATER THAN 100.5 F  *CHILLS WITH OR WITHOUT FEVER  NAUSEA AND VOMITING THAT IS NOT CONTROLLED WITH YOUR NAUSEA MEDICATION  *UNUSUAL SHORTNESS OF BREATH  *UNUSUAL BRUISING OR BLEEDING  TENDERNESS IN MOUTH AND THROAT WITH OR WITHOUT PRESENCE OF ULCERS  *URINARY PROBLEMS  *BOWEL PROBLEMS  UNUSUAL RASH Items with * indicate a potential emergency and should be followed up as soon as possible.  Feel free to call the clinic should you have any questions or concerns. The clinic phone number is [phone].  Please show the [facility] at check-in to the Emergency Department and triage nurse.  Coronavirus (COVID-19) Are you at risk?  Are you at risk for the Coronavirus (COVID-19)?  To be considered HIGH RISK for Coronavirus (COVID-19), you have to meet the following criteria:  . Traveled to [place], [place], [place], [place] or [place]; or in the [place] to [place], [place], [place], or [place]; and have fever, cough, and shortness of breath within the last 2 weeks of travel OR . Been in close contact with a person diagnosed with COVID-19 within the last 2 weeks and have fever, cough, and shortness of breath . IF YOU DO NOT MEET THESE CRITERIA, YOU ARE CONSIDERED LOW RISK FOR COVID-19.  What to do if you are HIGH RISK for COVID-19?  Marland Kitchen If you are having  a medical emergency, call 911. . Seek medical care right away. Before you go to a doctor's office, urgent care or emergency department, call ahead and tell them about your recent travel, contact with someone diagnosed with COVID-19, and your symptoms. You should receive instructions from your physician's office regarding next steps of care.  . When you arrive at healthcare provider, tell the healthcare staff immediately you have returned from visiting [place], [place], [place], [place] or [place]; or traveled in the [place] to [place], [place], [place], or [place]; in the last two weeks or you have been in close contact with a person diagnosed with COVID-19 in the last 2 weeks.   . Tell the health care staff about your symptoms: fever, cough and shortness of breath. . After you have been seen by a medical provider, you will be either: o Tested for (COVID-19) and discharged home on quarantine except to seek medical care if symptoms worsen, and asked to  - Stay home and avoid contact with others until you get your results (4-5 days)  - Avoid travel on public transportation if possible (such as bus, train, or airplane) or o Sent to the Emergency Department by EMS for evaluation, COVID-19 testing, and possible admission depending on your condition and test results.  What to do if you are LOW RISK for COVID-19?  Reduce your risk of any infection by using the same precautions used for avoiding the common cold or flu:  Marland Kitchen Wash your hands often with soap and warm water  for at least 20 seconds.  If soap and water are not readily available, use an alcohol-based hand sanitizer with at least 60% alcohol.  . If coughing or sneezing, cover your mouth and nose by coughing or sneezing into the elbow areas of your shirt or coat, into a tissue or into your sleeve (not your hands). . Avoid shaking hands with others and consider head nods or verbal greetings only. . Avoid touching your eyes, nose, or mouth  with unwashed hands.  . Avoid close contact with people who are sick. . Avoid places or events with large numbers of people in one location, like concerts or sporting events. . Carefully consider travel plans you have or are making. . If you are planning any travel outside or inside the [place], visit the CDC's Travelers' Health webpage for the latest health notices. . If you have some symptoms but not all symptoms, continue to monitor at home and seek medical attention if your symptoms worsen. . If you are having a medical emergency, call 911.   [place] / e-Visit: [email or web address]         [facility]: [facility]: [phone]                   [facility]: [phone]

## 2019-04-25 ENCOUNTER — Telehealth: Payer: Self-pay | Admitting: Oncology

## 2019-04-25 ENCOUNTER — Telehealth: Payer: Self-pay | Admitting: *Deleted

## 2019-04-25 LAB — CANCER ANTIGEN 19-9: CA 19-9: 130 U/mL — ABNORMAL HIGH (ref 0–35)

## 2019-04-25 NOTE — Telephone Encounter (Signed)
Called asking for CA19.9 result to be released to MyChart. Provided him the result value of 130. Informed him that only the physician has security clearance to release his labs.

## 2019-04-25 NOTE — Telephone Encounter (Signed)
Scheduled per los. Called and left msg  ° °

## 2019-04-26 ENCOUNTER — Inpatient Hospital Stay: Payer: Managed Care, Other (non HMO)

## 2019-04-26 ENCOUNTER — Other Ambulatory Visit: Payer: Self-pay

## 2019-04-26 VITALS — BP 128/72 | HR 92 | Temp 98.2°F | Resp 18

## 2019-04-26 DIAGNOSIS — Z5111 Encounter for antineoplastic chemotherapy: Secondary | ICD-10-CM | POA: Diagnosis not present

## 2019-04-26 DIAGNOSIS — C250 Malignant neoplasm of head of pancreas: Secondary | ICD-10-CM

## 2019-04-26 MED ORDER — HEPARIN SOD (PORK) LOCK FLUSH 100 UNIT/ML IV SOLN
500.0000 [IU] | Freq: Once | INTRAVENOUS | Status: AC | PRN
  Administered 2019-04-26: 500 [IU]
  Filled 2019-04-26: qty 5

## 2019-04-26 MED ORDER — SODIUM CHLORIDE 0.9% FLUSH
10.0000 mL | INTRAVENOUS | Status: DC | PRN
  Administered 2019-04-26: 10 mL
  Filled 2019-04-26: qty 10

## 2019-04-26 MED ORDER — DEXAMETHASONE SODIUM PHOSPHATE 10 MG/ML IJ SOLN
INTRAMUSCULAR | Status: AC
  Filled 2019-04-26: qty 1

## 2019-04-26 MED ORDER — DEXAMETHASONE SODIUM PHOSPHATE 10 MG/ML IJ SOLN
5.0000 mg | Freq: Once | INTRAMUSCULAR | Status: AC
  Administered 2019-04-26: 5 mg via INTRAVENOUS

## 2019-04-27 ENCOUNTER — Encounter: Payer: Self-pay | Admitting: Nurse Practitioner

## 2019-04-28 ENCOUNTER — Telehealth: Payer: Self-pay | Admitting: *Deleted

## 2019-04-28 NOTE — Telephone Encounter (Signed)
Called to ask when his CT scan will be scheduled. Has a house in [place] and wants to go there for a week or so before he has to return to see Dr. Benay Spice on 7/15. Also his labs still have not been released into Mychart. Explained that if MD fails to release labs, it will automatically be released in 5 days. Explained PA process before a scan can be ordered. He requested RN try to expedite this. Sent message to managed care to begin PA process as soon as possible. Will follow up on Monday. Explained to him as well that Christella Scheuermann tends to take awhile to approve scans.

## 2019-05-01 ENCOUNTER — Other Ambulatory Visit: Payer: Self-pay | Admitting: *Deleted

## 2019-05-01 ENCOUNTER — Encounter: Payer: Self-pay | Admitting: *Deleted

## 2019-05-01 ENCOUNTER — Other Ambulatory Visit: Payer: Self-pay | Admitting: Oncology

## 2019-05-01 ENCOUNTER — Other Ambulatory Visit: Payer: Self-pay | Admitting: Nurse Practitioner

## 2019-05-01 DIAGNOSIS — C250 Malignant neoplasm of head of pancreas: Secondary | ICD-10-CM

## 2019-05-01 MED ORDER — MAGIC MOUTHWASH: 5.0000 mL | Freq: Four times a day (QID) | ORAL | 0 refills | Status: DC | PRN

## 2019-05-01 MED ORDER — LORAZEPAM 0.5 MG PO TABS: 0.5000 mg | ORAL_TABLET | Freq: Three times a day (TID) | ORAL | 0 refills | Status: DC | PRN

## 2019-05-07 ENCOUNTER — Other Ambulatory Visit: Payer: Self-pay | Admitting: Oncology

## 2019-05-11 ENCOUNTER — Other Ambulatory Visit: Payer: Self-pay

## 2019-05-11 ENCOUNTER — Encounter (HOSPITAL_COMMUNITY): Payer: Self-pay

## 2019-05-11 ENCOUNTER — Ambulatory Visit (HOSPITAL_COMMUNITY)
Admission: RE | Admit: 2019-05-11 | Discharge: 2019-05-11 | Disposition: A | Discharge status: Home / Self Care | Payer: Managed Care, Other (non HMO) | Source: Ambulatory Visit | Attending: Nurse Practitioner | Admitting: Nurse Practitioner

## 2019-05-11 DIAGNOSIS — C250 Malignant neoplasm of head of pancreas: Secondary | ICD-10-CM | POA: Diagnosis present

## 2019-05-11 MED ORDER — SODIUM CHLORIDE (PF) 0.9 % IJ SOLN
INTRAMUSCULAR | Status: AC
  Filled 2019-05-11: qty 50

## 2019-05-11 MED ORDER — IOHEXOL 300 MG/ML  SOLN
100.0000 mL | Freq: Once | INTRAMUSCULAR | Status: AC | PRN
  Administered 2019-05-11: 100 mL via INTRAVENOUS

## 2019-05-12 ENCOUNTER — Telehealth: Payer: Self-pay

## 2019-05-12 NOTE — Telephone Encounter (Signed)
TC to patient per Lattie Haw to let him know that the pancreas mass is smaller, no evidence of disease progression, follow-up as scheduled. Patient wanted to verify that his next appointment was on the 15th and I let him know that yes his next appointment is on 05/17/19 @9  with [name]. Patient verbalized understanding. No further problems or concerns at this time.

## 2019-05-16 ENCOUNTER — Encounter: Payer: Self-pay | Admitting: Genetic Counselor

## 2019-05-16 ENCOUNTER — Ambulatory Visit: Payer: Self-pay | Admitting: Genetic Counselor

## 2019-05-16 ENCOUNTER — Telehealth: Payer: Self-pay | Admitting: Genetic Counselor

## 2019-05-16 DIAGNOSIS — Z1379 Encounter for other screening for genetic and chromosomal anomalies: Secondary | ICD-10-CM

## 2019-05-16 NOTE — Progress Notes (Signed)
HPI:  Brian Torres was previously seen in the [facility] clinic due to a personal and family history of cancer and concerns regarding a hereditary predisposition to cancer. Please refer to our prior cancer genetics clinic note for more information regarding our discussion, assessment and recommendations, at the time. Brian Torres recent genetic test results were disclosed to him, as were recommendations warranted by these results. These results and recommendations are discussed in more detail below.  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[place])  01/26/2019 Initial Diagnosis   Cancer of head of pancreas ([place])   01/26/2019 Cancer Staging   Staging form: Exocrine Pancreas, AJCC 8th Edition - Clinical: Stage Unknown (cTX, cN0, cM0) - Signed by Ladell Pier, MD on 01/26/2019   02/06/2019 -  Chemotherapy   The patient had palonosetron (ALOXI) injection 0.25 mg, 0.25 mg, Intravenous,  Once, 6 of 8 cycles Administration: 0.25 mg (02/06/2019), 0.25 mg (02/20/2019), 0.25 mg (03/09/2019), 0.25 mg (03/21/2019), 0.25 mg (04/05/2019), 0.25 mg (04/24/2019) pegfilgrastim-cbqv (UDENYCA) injection 6 mg, 6 mg, Subcutaneous, Once, 2 of 4 cycles Administration: 6 mg (02/22/2019) irinotecan (CAMPTOSAR) 340 mg in dextrose 5 % 500 mL chemo infusion, 150 mg/m2 = 340 mg (100 % of original dose 150 mg/m2), Intravenous,  Once, 6 of 8 cycles Dose modification: 150 mg/m2 (original dose 150 mg/m2, Cycle 1, Reason: Provider Judgment), 120 mg/m2 (original dose 150 mg/m2, Cycle 3, Reason: Provider Judgment) Administration: 340 mg (02/06/2019), 340 mg (02/20/2019), 260 mg (03/09/2019), 260 mg (03/21/2019), 260 mg (04/05/2019), 260 mg (04/24/2019) leucovorin 892 mg in dextrose 5 % 250 mL infusion, 400 mg/m2 = 892 mg, Intravenous,  Once, 6 of 8 cycles Dose modification: 300 mg/m2 (original dose 400 mg/m2, Cycle 3, Reason: Provider Judgment) Administration: 892 mg (02/06/2019), 892 mg (02/20/2019), 670 mg (03/21/2019),  670 mg (03/09/2019), 670 mg (04/05/2019), 642 mg (04/24/2019) oxaliplatin (ELOXATIN) 200 mg in dextrose 5 % 500 mL chemo infusion, 190 mg, Intravenous,  Once, 6 of 8 cycles Dose modification: 65 mg/m2 (original dose 85 mg/m2, Cycle 6, Reason: Provider Judgment) Administration: 200 mg (02/06/2019), 200 mg (02/20/2019), 200 mg (03/09/2019), 200 mg (03/21/2019), 200 mg (04/05/2019), 140 mg (04/24/2019) fosaprepitant (EMEND) 150 mg, dexamethasone (DECADRON) 10 mg in sodium chloride 0.9 % 145 mL IVPB, , Intravenous,  Once, 6 of 8 cycles Administration:  (02/06/2019),  (02/20/2019),  (03/09/2019),  (03/21/2019),  (04/05/2019),  (04/24/2019) fluorouracil (ADRUCIL) 5,350 mg in sodium chloride 0.9 % 143 mL chemo infusion, 2,400 mg/m2 = 5,350 mg, Intravenous, 1 Day/Dose, 6 of 8 cycles Dose modification: 1,800 mg/m2 (original dose 2,400 mg/m2, Cycle 3, Reason: Provider Judgment) Administration: 5,350 mg (02/06/2019), 5,350 mg (02/20/2019), 4,000 mg (03/09/2019), 4,000 mg (03/21/2019), 4,000 mg (04/05/2019), 3,850 mg (04/24/2019)  for chemotherapy treatment.    05/09/2019 Genetic Testing   Negative genetic testing on the CustomNext-Cancer+RNAinsight.  The CustomNext-Expanded gene panel offered by [facility] and includes sequencing and rearrangement analysis for the following 81 genes: AIP, ALK, APC*, ATM*, AXIN2, BAP1, BARD1, BLM, BMPR1A, BRCA1*, BRCA2*, BRIP1*, CDC73, CDH1*, CDK4, CDKN1B, CDKN2A, CHEK2*, CTNNA1, DICER1, FANCC, FH, FLCN, GALNT12, HOXB13, KIT, MAX, MEN1, MET, MLH1*, MRE11A, MSH2*, MSH6*, MUTYH*, NBN, NF1*, NF2, NTHL1, PALB2*, PDGFRA, PHOX2B, PMS2*, POLD1, POLE, POT1, PRKAR1A, PTCH1, PTEN*, RAD50, RAD51C*, RAD51D*, RB1, RET, SDHA, SDHAF2, SDHB, SDHC, SDHD, SMAD4, SMARCA4, SMARCB1, SMARCE1, STK11, SUFU, TMEM127, TP53*, TSC1, TSC2, VHL and XRCC2 (sequencing and deletion/duplication); CASR, CFTR, CPA1, CTRC, EGFR, MITF, PRSS1 and SPINK1 (sequencing only); EPCAM and GREM1 (deletion/duplication only). DNA  and RNA analyses performed for  * genes. The report date is May 09, 2019.     FAMILY HISTORY:  We obtained a detailed, 4-generation family history.  Significant diagnoses are listed below: Family History  Problem Relation Age of Onset  . Diabetes Mother   . Diabetes Father   . CAD Father   . Melanoma Father   . Breast cancer Paternal Aunt 97  . COPD Maternal Aunt   . Melanoma Paternal Uncle   . Stroke Maternal Grandmother   . Tuberculosis Paternal Grandmother   . Cancer Neg Hx     The patient has a son and two daughters who are cancer free.  He has two brothers who do not have cancer. His mother is living and his father is deceased.  The patients mother has diabetes but is otherwise healthy. She had one sister who died of COPD. Her parents are both deceased from non-cancer related issues.  The patient's father died at 13.  He had a history of skin cancer and melanoma. He had four brothers and two sisters.  One sister had breast cancer at 84 and one brother had melanoma.  The paternal grandparents are both deceased.  Brian Torres is unaware of previous family history of genetic testing for hereditary cancer risks. Patient's maternal ancestors are of caucasian descent, and paternal ancestors are of caucasian descent. There is no reported Ashkenazi Jewish ancestry. There is no known consanguinity.     GENETIC TEST RESULTS: Genetic testing reported out on May 09, 2019 through the CustomNext-Cancer+RNAinsight cancer panel found no pathogenic mutations. The CustomNext-Expanded gene panel offered by [facility] and includes sequencing and rearrangement analysis for the following 81 genes: AIP, ALK, APC*, ATM*, AXIN2, BAP1, BARD1, BLM, BMPR1A, BRCA1*, BRCA2*, BRIP1*, CDC73, CDH1*, CDK4, CDKN1B, CDKN2A, CHEK2*, CTNNA1, DICER1, FANCC, FH, FLCN, GALNT12, HOXB13, KIT, MAX, MEN1, MET, MLH1*, MRE11A, MSH2*, MSH6*, MUTYH*, NBN, NF1*, NF2, NTHL1, PALB2*, PDGFRA, PHOX2B, PMS2*, POLD1, POLE, POT1, PRKAR1A, PTCH1, PTEN*, RAD50,  RAD51C*, RAD51D*, RB1, RET, SDHA, SDHAF2, SDHB, SDHC, SDHD, SMAD4, SMARCA4, SMARCB1, SMARCE1, STK11, SUFU, TMEM127, TP53*, TSC1, TSC2, VHL and XRCC2 (sequencing and deletion/duplication); CASR, CFTR, CPA1, CTRC, EGFR, MITF, PRSS1 and SPINK1 (sequencing only); EPCAM and GREM1 (deletion/duplication only). DNA and RNA analyses performed for * genes. The test report has been scanned into EPIC and is located under the Molecular Pathology section of the Results Review tab.  A portion of the result report is included below for reference.     We discussed with Brian Torres that because current genetic testing is not perfect, it is possible there may be a gene mutation in one of these genes that current testing cannot detect, but that chance is small.  We also discussed, that there could be another gene that has not yet been discovered, or that we have not yet tested, that is responsible for the cancer diagnoses in the family. It is also possible there is a hereditary cause for the cancer in the family that Brian Torres did not inherit and therefore was not identified in his testing.  Therefore, it is important to remain in touch with cancer genetics in the future so that we can continue to offer Brian Torres the most up to date genetic testing.   ADDITIONAL GENETIC TESTING: We discussed with Brian Torres that his genetic testing was fairly extensive.  If there are genes identified to increase cancer risk that can be analyzed in the future, we would be happy to discuss and coordinate this testing  at that time.    CANCER SCREENING RECOMMENDATIONS: Brian Torres test result is considered negative (normal).  This means that we have not identified a hereditary cause for his personal and family history of cancer at this time. Most cancers happen by chance and this negative test suggests that his cancer may fall into this category.    While reassuring, this does not definitively rule out a hereditary predisposition to cancer. It is  still possible that there could be genetic mutations that are undetectable by current technology. There could be genetic mutations in genes that have not been tested or identified to increase cancer risk.  Therefore, it is recommended he continue to follow the cancer management and screening guidelines provided by his oncology and primary healthcare provider.   An individual's cancer risk and medical management are not determined by genetic test results alone. Overall cancer risk assessment incorporates additional factors, including personal medical history, family history, and any available genetic information that may result in a personalized plan for cancer prevention and surveillance  RECOMMENDATIONS FOR FAMILY MEMBERS:  Individuals in this family might be at some increased risk of developing cancer, over the general population risk, simply due to the family history of cancer.  We recommended women in this family have a yearly mammogram beginning at age 39, or 32 years younger than the earliest onset of cancer, an annual clinical breast exam, and perform monthly breast self-exams. Women in this family should also have a gynecological exam as recommended by their primary provider. All family members should have a colonoscopy by age 51.  FOLLOW-UP: Lastly, we discussed with Brian Torres that cancer genetics is a rapidly advancing field and it is possible that new genetic tests will be appropriate for him and/or his family members in the future. We encouraged him to remain in contact with cancer genetics on an annual basis so we can update his personal and family histories and let him know of advances in cancer genetics that may benefit this family.   Our contact number was provided. Brian Torres questions were answered to his satisfaction, and he knows he is welcome to call us at anytime with additional questions or concerns.   Roma Kayser, MS, [facility] Certified Genetic Counselor [email or web address]

## 2019-05-16 NOTE — Telephone Encounter (Signed)
Revealed negative genetic testing.  Discussed that we do not know why he has pancreatic cancer or why there is cancer in the family. It could be due to a different gene that we are not testing, or maybe our current technology may not be able to pick something up.  It will be important for him to keep in contact with genetics to keep up with whether additional testing may be needed.      

## 2019-05-17 ENCOUNTER — Telehealth: Payer: Self-pay | Admitting: Oncology

## 2019-05-17 ENCOUNTER — Inpatient Hospital Stay: Payer: Managed Care, Other (non HMO) | Attending: Oncology | Admitting: Oncology

## 2019-05-17 ENCOUNTER — Other Ambulatory Visit: Payer: Self-pay

## 2019-05-17 VITALS — BP 133/77 | HR 84 | Temp 98.7°F | Resp 18 | Ht 76.0 in | Wt 198.3 lb

## 2019-05-17 DIAGNOSIS — C250 Malignant neoplasm of head of pancreas: Secondary | ICD-10-CM | POA: Diagnosis not present

## 2019-05-17 DIAGNOSIS — N189 Chronic kidney disease, unspecified: Secondary | ICD-10-CM | POA: Diagnosis not present

## 2019-05-17 DIAGNOSIS — Z7901 Long term (current) use of anticoagulants: Secondary | ICD-10-CM | POA: Insufficient documentation

## 2019-05-17 DIAGNOSIS — Z5111 Encounter for antineoplastic chemotherapy: Secondary | ICD-10-CM | POA: Diagnosis present

## 2019-05-17 DIAGNOSIS — E1122 Type 2 diabetes mellitus with diabetic chronic kidney disease: Secondary | ICD-10-CM | POA: Insufficient documentation

## 2019-05-17 DIAGNOSIS — D649 Anemia, unspecified: Secondary | ICD-10-CM | POA: Diagnosis not present

## 2019-05-17 DIAGNOSIS — Z86718 Personal history of other venous thrombosis and embolism: Secondary | ICD-10-CM | POA: Diagnosis not present

## 2019-05-17 DIAGNOSIS — Z452 Encounter for adjustment and management of vascular access device: Secondary | ICD-10-CM | POA: Insufficient documentation

## 2019-05-17 DIAGNOSIS — I129 Hypertensive chronic kidney disease with stage 1 through stage 4 chronic kidney disease, or unspecified chronic kidney disease: Secondary | ICD-10-CM | POA: Insufficient documentation

## 2019-05-17 MED ORDER — ONDANSETRON HCL 8 MG PO TABS: 8.0000 mg | ORAL_TABLET | Freq: Three times a day (TID) | ORAL | 1 refills | Status: DC | PRN

## 2019-05-17 NOTE — Addendum Note (Signed)
Addended by: Tania Ade on: 05/17/2019 10:32 AM   Modules accepted: Orders

## 2019-05-17 NOTE — Progress Notes (Signed)
[facility] OFFICE PROGRESS NOTE   Diagnosis: Pancreas cancer  INTERVAL HISTORY:   [name] returns as scheduled.  He completed another cycle of FOLFIRI [place] on 04/24/2019.  He had nausea following chemotherapy, but less than with previous cycles.  No diarrhea.  Mild numbness in the right greater than left fingers.  This is chiefly present in the right fourth and fifth fingers.  The numbness does not interfere with activity.  He has been golfing.  His blood sugar has been under better control.  Objective:  Vital signs in last 24 hours:  Blood pressure 133/77, pulse 84, temperature 98.7 F (37.1 C), temperature source Oral, resp. rate 18, height 6\' 4"  (1.93 m), weight 198 lb 4.8 oz (89.9 kg), SpO2 100 %.    HEENT: No thrush or ulcers GI: Nontender, no hepatomegaly Vascular: Varicosities and mild enlargement of the left compared to the right lower leg Neuro: Very mild loss of vibratory sense at the fingertips bilaterally    Portacath/PICC-without erythema  Lab Results:  Lab Results  Component Value Date   WBC 6.2 04/24/2019   HGB 10.1 (L) 04/24/2019   HCT 31.3 (L) 04/24/2019   MCV 91.5 04/24/2019   PLT 371 04/24/2019   NEUTROABS 3.0 04/24/2019    CMP  Lab Results  Component Value Date   NA 133 (L) 04/24/2019   K 4.7 04/24/2019   CL 99 04/24/2019   CO2 25 04/24/2019   GLUCOSE 161 (H) 04/24/2019   BUN 12 04/24/2019   CREATININE 1.01 04/24/2019   CALCIUM 8.8 (L) 04/24/2019   PROT 6.9 04/24/2019   ALBUMIN 2.9 (L) 04/24/2019   AST 19 04/24/2019   ALT 29 04/24/2019   ALKPHOS 155 (H) 04/24/2019   BILITOT 0.2 (L) 04/24/2019   GFRNONAA >60 04/24/2019   GFRAA >60 04/24/2019     Medications: I have reviewed the patient's current medications.   Assessment/Plan: 1. Pancreas head mass  Presented with jaundice, bilirubin elevated at 21.4  CTs abdomen/pelvis 12/20/2018-severe intrahepatic and extrahepatic biliary dilatation due to a possible 3.5 x 2.5  cm solid mass in the pancreatic head. Pancreatic ductal dilatation noted as well with multiple cystic lesions in the pancreatic tail and body.   CT chest 12/20/2018-occasional nonspecific small pulmonary nodules felt to likely be related to prior infection or inflammation.   ERCP by Dr. Watt Climes 12/21/2018. The major papilla was on the rim of a diverticulum. The minor papilla appeared to be bulging. A biopsy of the duodenum adjacent to the minor papilla was performed. Plastic stent was placed into the ventral pancreatic duct. Biliary sphincterotomy performed. Covered metal stent placed into the common bile duct. Bile duct brushings showed benign reactive/reparative changes. Biopsy of polypoid duodenal mucosa suggestive of nodular peptic duodenitis, negative for dysplasia or malignancy.   Upper EUS by Dr. Paulita Fujita on 01/03/2019 showed a few cystic lesions in the pancreatic body and pancreatic tail. A stent was visualized in the common bile duct. Mass identified in the pancreatic head with fine-needle aspiration performed. Lesion appeared to abut or potentially superficially invade the superior mesenteric vein. No lymphadenopathy. No involvement of the superior mesenteric artery or celiac artery. Pathology returned suspicious for malignancy. Dr. Paulita Fujita notes if pathology shows adenocarcinoma the lesion would be staged T3 N0 MX.  Repeat ERCP and placement of a metal bile duct stent 01/24/2019  EUS 01/24/2019, T3N0 pancreas head mass with abutment of the SMV, FNA of the pancreas mass revealed adenocarcinoma, cytology from the bile duct stent revealed  adenocarcinoma  Cycle 1 FOLFIRINOX4/04/2019  Cycle 2 FOLFIRINOX4/20/2020, Udenyca added  Cycle 3 FOLFIRINOX 03/09/2019, Irinotecan and 5-fluorouracil dose reduced, Udenyca held  Cycle 4 FOLFIRINOX 03/21/2019-Udenyca held  Cycle 5 FOLFIRINOX 04/05/2019  Cycle 6 FOLFIRINOX 04/24/2019 (oxaliplatin dose reduced due to poor tolerance of chemotherapy)   CT 05/11/2019- decreased size of pancreas head mass, unchanged peripancreatic lymph node, no evidence of metastatic disease, improved/resolved fluid collections at the pancreas tail  Cycle 7 FOLFIRINOX 05/22/2019  2. Biliary obstruction status post stent placement 3. History of left lower extremity DVT 2017, 2018-on Eliquis. 4. Type 2 diabetes 5. Hypertension 6. Chronic kidney disease 7. Tobacco use 8. Anemia, macrocytic. Transfused 2 units of blood 12/20/2018.On oral iron. Improved 02/01/2019 and 02/06/2019. 9. Port-A-Cath placement 02/02/2019, Dr. Barry Dienes 10. 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 Hennes appears well.  He has completed 6 cycles of FOLFIRINOX.  The CA 19-9 is lower and restaging CT reveals improvement in the pancreas head mass.  His case was presented at the GI tumor conference this morning.  The consensus recommendation is to proceed with 2 additional cycles of FOLFIRINOX and then surgery.  Mr. Somera will be scheduled for a follow-up appoint with Dr. Barry Dienes.  He is scheduled for cycle 7 FOLFIRINOX on 05/22/2019.  He does not wish to receive Neulasta secondary to toxicity when he received Neulasta in the past.  Betsy Coder, MD  05/17/2019  9:56 AM

## 2019-05-17 NOTE — Telephone Encounter (Signed)
Scheduled per los. Patient declined printout  

## 2019-05-22 ENCOUNTER — Inpatient Hospital Stay: Payer: Managed Care, Other (non HMO)

## 2019-05-22 ENCOUNTER — Other Ambulatory Visit: Payer: Self-pay

## 2019-05-22 VITALS — BP 132/85 | HR 81 | Temp 98.6°F | Resp 16 | Wt 199.8 lb

## 2019-05-22 DIAGNOSIS — C250 Malignant neoplasm of head of pancreas: Secondary | ICD-10-CM

## 2019-05-22 DIAGNOSIS — Z5111 Encounter for antineoplastic chemotherapy: Secondary | ICD-10-CM | POA: Diagnosis not present

## 2019-05-22 DIAGNOSIS — Z95828 Presence of other vascular implants and grafts: Secondary | ICD-10-CM

## 2019-05-22 LAB — CBC WITH DIFFERENTIAL (CANCER CENTER ONLY)
Abs Immature Granulocytes: 0.02 10*3/uL (ref 0.00–0.07)
Basophils Absolute: 0.1 10*3/uL (ref 0.0–0.1)
Basophils Relative: 1 %
Eosinophils Absolute: 0.2 10*3/uL (ref 0.0–0.5)
Eosinophils Relative: 5 %
HCT: 31.9 % — ABNORMAL LOW (ref 39.0–52.0)
Hemoglobin: 10.5 g/dL — ABNORMAL LOW (ref 13.0–17.0)
Immature Granulocytes: 1 %
Lymphocytes Relative: 33 %
Lymphs Abs: 1.4 10*3/uL (ref 0.7–4.0)
MCH: 31.4 pg (ref 26.0–34.0)
MCHC: 32.9 g/dL (ref 30.0–36.0)
MCV: 95.5 fL (ref 80.0–100.0)
Monocytes Absolute: 0.5 10*3/uL (ref 0.1–1.0)
Monocytes Relative: 11 %
Neutro Abs: 2.1 10*3/uL (ref 1.7–7.7)
Neutrophils Relative %: 49 %
Platelet Count: 149 10*3/uL — ABNORMAL LOW (ref 150–400)
RBC: 3.34 MIL/uL — ABNORMAL LOW (ref 4.22–5.81)
RDW: 16.9 % — ABNORMAL HIGH (ref 11.5–15.5)
WBC Count: 4.2 10*3/uL (ref 4.0–10.5)
nRBC: 0 % (ref 0.0–0.2)

## 2019-05-22 LAB — CMP (CANCER CENTER ONLY)
ALT: 14 U/L (ref 0–44)
AST: 14 U/L — ABNORMAL LOW (ref 15–41)
Albumin: 3.5 g/dL (ref 3.5–5.0)
Alkaline Phosphatase: 113 U/L (ref 38–126)
Anion gap: 9 (ref 5–15)
BUN: 13 mg/dL (ref 6–20)
CO2: 23 mmol/L (ref 22–32)
Calcium: 8.7 mg/dL — ABNORMAL LOW (ref 8.9–10.3)
Chloride: 107 mmol/L (ref 98–111)
Creatinine: 0.94 mg/dL (ref 0.61–1.24)
GFR, Est AFR Am: >60 mL/min (ref >60)
GFR, Estimated: >60 mL/min (ref >60)
Glucose, Bld: 123 mg/dL — ABNORMAL HIGH (ref 70–99)
Potassium: 4.7 mmol/L (ref 3.5–5.1)
Sodium: 139 mmol/L (ref 135–145)
Total Bilirubin: 0.3 mg/dL (ref 0.3–1.2)
Total Protein: 6.7 g/dL (ref 6.5–8.1)

## 2019-05-22 MED ORDER — DEXTROSE 5 % IV SOLN
Freq: Once | INTRAVENOUS | Status: AC
  Administered 2019-05-22: 10:00:00 via INTRAVENOUS
  Filled 2019-05-22: qty 250

## 2019-05-22 MED ORDER — SODIUM CHLORIDE 0.9% FLUSH
10.0000 mL | INTRAVENOUS | Status: DC | PRN
  Administered 2019-05-22: 10 mL
  Filled 2019-05-22: qty 10

## 2019-05-22 MED ORDER — HEPARIN SOD (PORK) LOCK FLUSH 100 UNIT/ML IV SOLN
500.0000 [IU] | Freq: Once | INTRAVENOUS | Status: DC | PRN
  Filled 2019-05-22: qty 5

## 2019-05-22 MED ORDER — SODIUM CHLORIDE 0.9 % IV SOLN
300.0000 mg/m2 | Freq: Once | INTRAVENOUS | Status: AC
  Administered 2019-05-22: 642 mg via INTRAVENOUS
  Filled 2019-05-22: qty 32.1

## 2019-05-22 MED ORDER — SODIUM CHLORIDE 0.9 % IV SOLN
120.0000 mg/m2 | Freq: Once | INTRAVENOUS | Status: AC
  Administered 2019-05-22: 260 mg via INTRAVENOUS
  Filled 2019-05-22: qty 13

## 2019-05-22 MED ORDER — SODIUM CHLORIDE 0.9 % IV SOLN
1800.0000 mg/m2 | INTRAVENOUS | Status: DC
  Administered 2019-05-22: 3850 mg via INTRAVENOUS
  Filled 2019-05-22: qty 77

## 2019-05-22 MED ORDER — SODIUM CHLORIDE 0.9 % IV SOLN
Freq: Once | INTRAVENOUS | Status: AC
  Administered 2019-05-22: 10:00:00 via INTRAVENOUS
  Filled 2019-05-22: qty 5

## 2019-05-22 MED ORDER — PALONOSETRON HCL INJECTION 0.25 MG/5ML
0.2500 mg | Freq: Once | INTRAVENOUS | Status: AC
  Administered 2019-05-22: 0.25 mg via INTRAVENOUS

## 2019-05-22 MED ORDER — OXALIPLATIN CHEMO INJECTION 100 MG/20ML
65.0000 mg/m2 | Freq: Once | INTRAVENOUS | Status: AC
  Administered 2019-05-22: 140 mg via INTRAVENOUS
  Filled 2019-05-22: qty 20

## 2019-05-22 MED ORDER — PALONOSETRON HCL INJECTION 0.25 MG/5ML
INTRAVENOUS | Status: AC
  Filled 2019-05-22: qty 5

## 2019-05-22 MED ORDER — ATROPINE SULFATE 1 MG/ML IJ SOLN: 0.5000 mg | Freq: Once | INTRAMUSCULAR | Status: DC | PRN

## 2019-05-22 MED ORDER — SODIUM CHLORIDE 0.9% FLUSH
10.0000 mL | INTRAVENOUS | Status: DC | PRN
  Filled 2019-05-22: qty 10

## 2019-05-22 MED ORDER — ATROPINE SULFATE 1 MG/ML IJ SOLN
INTRAMUSCULAR | Status: AC
  Filled 2019-05-22: qty 1

## 2019-05-22 NOTE — Patient Instructions (Signed)
[facility] Discharge Instructions for Patients Receiving Chemotherapy  Today you received the following chemotherapy agents Oxaliplatin, Irinotecan, Leucovorin, Fluorouracil  To help prevent nausea and vomiting after your treatment, we encourage you to take your nausea medicationas directed by your MD.   If you develop nausea and vomiting that is not controlled by your nausea medication, call the clinic.   BELOW ARE SYMPTOMS THAT SHOULD BE REPORTED IMMEDIATELY:  *FEVER GREATER THAN 100.5 F  *CHILLS WITH OR WITHOUT FEVER  NAUSEA AND VOMITING THAT IS NOT CONTROLLED WITH YOUR NAUSEA MEDICATION  *UNUSUAL SHORTNESS OF BREATH  *UNUSUAL BRUISING OR BLEEDING  TENDERNESS IN MOUTH AND THROAT WITH OR WITHOUT PRESENCE OF ULCERS  *URINARY PROBLEMS  *BOWEL PROBLEMS  UNUSUAL RASH Items with * indicate a potential emergency and should be followed up as soon as possible.  Feel free to call the clinic should you have any questions or concerns. The clinic phone number is [phone].  Please show the [facility] at check-in to the Emergency Department and triage nurse.  Coronavirus (COVID-19) Are you at risk?  Are you at risk for the Coronavirus (COVID-19)?  To be considered HIGH RISK for Coronavirus (COVID-19), you have to meet the following criteria:  . Traveled to [place], [place], [place], [place] or [place]; or in the [place] to [place], [place], [place], or [place]; and have fever, cough, and shortness of breath within the last 2 weeks of travel OR . Been in close contact with a person diagnosed with COVID-19 within the last 2 weeks and have fever, cough, and shortness of breath . IF YOU DO NOT MEET THESE CRITERIA, YOU ARE CONSIDERED LOW RISK FOR COVID-19.  What to do if you are HIGH RISK for COVID-19?  Marland Kitchen If you are having a medical emergency, call 911. . Seek medical care right away. Before you go to a doctor's office, urgent care or emergency  department, call ahead and tell them about your recent travel, contact with someone diagnosed with COVID-19, and your symptoms. You should receive instructions from your physician's office regarding next steps of care.  . When you arrive at healthcare provider, tell the healthcare staff immediately you have returned from visiting [place], [place], [place], [place] or [place]; or traveled in the [place] to [place], [place], [place], or [place]; in the last two weeks or you have been in close contact with a person diagnosed with COVID-19 in the last 2 weeks.   . Tell the health care staff about your symptoms: fever, cough and shortness of breath. . After you have been seen by a medical provider, you will be either: o Tested for (COVID-19) and discharged home on quarantine except to seek medical care if symptoms worsen, and asked to  - Stay home and avoid contact with others until you get your results (4-5 days)  - Avoid travel on public transportation if possible (such as bus, train, or airplane) or o Sent to the Emergency Department by EMS for evaluation, COVID-19 testing, and possible admission depending on your condition and test results.  What to do if you are LOW RISK for COVID-19?  Reduce your risk of any infection by using the same precautions used for avoiding the common cold or flu:  Marland Kitchen Wash your hands often with soap and warm water for at least 20 seconds.  If soap and water are not readily available, use an alcohol-based hand sanitizer with at least 60% alcohol.  Marland Kitchen  If coughing or sneezing, cover your mouth and nose by coughing or sneezing into the elbow areas of your shirt or coat, into a tissue or into your sleeve (not your hands). . Avoid shaking hands with others and consider head nods or verbal greetings only. . Avoid touching your eyes, nose, or mouth with unwashed hands.  . Avoid close contact with people who are sick. . Avoid places or events with large numbers of people  in one location, like concerts or sporting events. . Carefully consider travel plans you have or are making. . If you are planning any travel outside or inside the [place], visit the CDC's Travelers' Health webpage for the latest health notices. . If you have some symptoms but not all symptoms, continue to monitor at home and seek medical attention if your symptoms worsen. . If you are having a medical emergency, call 911.   [facility] / e-Visit: [email or web address]         [facility]: [facility]: [phone]                   [facility]: [phone]

## 2019-05-23 LAB — CANCER ANTIGEN 19-9: CA 19-9: 70 U/mL — ABNORMAL HIGH (ref 0–35)

## 2019-05-24 ENCOUNTER — Other Ambulatory Visit: Payer: Self-pay

## 2019-05-24 ENCOUNTER — Inpatient Hospital Stay: Payer: Managed Care, Other (non HMO)

## 2019-05-24 VITALS — BP 138/78 | HR 78 | Temp 98.2°F | Resp 18

## 2019-05-24 DIAGNOSIS — Z5111 Encounter for antineoplastic chemotherapy: Secondary | ICD-10-CM | POA: Diagnosis not present

## 2019-05-24 DIAGNOSIS — C250 Malignant neoplasm of head of pancreas: Secondary | ICD-10-CM

## 2019-05-24 MED ORDER — HEPARIN SOD (PORK) LOCK FLUSH 100 UNIT/ML IV SOLN
500.0000 [IU] | Freq: Once | INTRAVENOUS | Status: AC | PRN
  Administered 2019-05-24: 500 [IU]
  Filled 2019-05-24: qty 5

## 2019-05-24 MED ORDER — SODIUM CHLORIDE 0.9% FLUSH
10.0000 mL | INTRAVENOUS | Status: DC | PRN
  Administered 2019-05-24: 10 mL
  Filled 2019-05-24: qty 10

## 2019-06-04 ENCOUNTER — Other Ambulatory Visit: Payer: Self-pay | Admitting: Oncology

## 2019-06-06 ENCOUNTER — Other Ambulatory Visit: Payer: Self-pay | Admitting: *Deleted

## 2019-06-06 ENCOUNTER — Other Ambulatory Visit: Payer: Self-pay

## 2019-06-06 ENCOUNTER — Encounter: Payer: Self-pay | Admitting: Nurse Practitioner

## 2019-06-06 ENCOUNTER — Inpatient Hospital Stay: Payer: Managed Care, Other (non HMO)

## 2019-06-06 ENCOUNTER — Telehealth: Payer: Self-pay

## 2019-06-06 ENCOUNTER — Telehealth: Payer: Self-pay | Admitting: Nurse Practitioner

## 2019-06-06 ENCOUNTER — Inpatient Hospital Stay: Payer: Managed Care, Other (non HMO) | Attending: Oncology | Admitting: Nurse Practitioner

## 2019-06-06 VITALS — BP 124/81 | HR 82 | Temp 98.5°F | Resp 18 | Ht 76.0 in | Wt 205.0 lb

## 2019-06-06 DIAGNOSIS — Z5111 Encounter for antineoplastic chemotherapy: Secondary | ICD-10-CM | POA: Diagnosis present

## 2019-06-06 DIAGNOSIS — C250 Malignant neoplasm of head of pancreas: Secondary | ICD-10-CM

## 2019-06-06 DIAGNOSIS — Z95828 Presence of other vascular implants and grafts: Secondary | ICD-10-CM

## 2019-06-06 DIAGNOSIS — Z86718 Personal history of other venous thrombosis and embolism: Secondary | ICD-10-CM | POA: Insufficient documentation

## 2019-06-06 DIAGNOSIS — D649 Anemia, unspecified: Secondary | ICD-10-CM | POA: Diagnosis not present

## 2019-06-06 DIAGNOSIS — E1122 Type 2 diabetes mellitus with diabetic chronic kidney disease: Secondary | ICD-10-CM | POA: Diagnosis not present

## 2019-06-06 DIAGNOSIS — F17210 Nicotine dependence, cigarettes, uncomplicated: Secondary | ICD-10-CM | POA: Diagnosis not present

## 2019-06-06 DIAGNOSIS — I129 Hypertensive chronic kidney disease with stage 1 through stage 4 chronic kidney disease, or unspecified chronic kidney disease: Secondary | ICD-10-CM | POA: Insufficient documentation

## 2019-06-06 DIAGNOSIS — N189 Chronic kidney disease, unspecified: Secondary | ICD-10-CM | POA: Insufficient documentation

## 2019-06-06 DIAGNOSIS — Z7901 Long term (current) use of anticoagulants: Secondary | ICD-10-CM | POA: Insufficient documentation

## 2019-06-06 DIAGNOSIS — R110 Nausea: Secondary | ICD-10-CM | POA: Insufficient documentation

## 2019-06-06 LAB — CBC WITH DIFFERENTIAL/PLATELET
Abs Immature Granulocytes: 0.02 10*3/uL (ref 0.00–0.07)
Basophils Absolute: 0 10*3/uL (ref 0.0–0.1)
Basophils Relative: 1 %
Eosinophils Absolute: 0.1 10*3/uL (ref 0.0–0.5)
Eosinophils Relative: 4 %
HCT: 32 % — ABNORMAL LOW (ref 39.0–52.0)
Hemoglobin: 10.5 g/dL — ABNORMAL LOW (ref 13.0–17.0)
Immature Granulocytes: 1 %
Lymphocytes Relative: 33 %
Lymphs Abs: 1.1 10*3/uL (ref 0.7–4.0)
MCH: 31.4 pg (ref 26.0–34.0)
MCHC: 32.8 g/dL (ref 30.0–36.0)
MCV: 95.8 fL (ref 80.0–100.0)
Monocytes Absolute: 0.5 10*3/uL (ref 0.1–1.0)
Monocytes Relative: 16 %
Neutro Abs: 1.6 10*3/uL — ABNORMAL LOW (ref 1.7–7.7)
Neutrophils Relative %: 45 %
Platelets: 169 10*3/uL (ref 150–400)
RBC: 3.34 MIL/uL — ABNORMAL LOW (ref 4.22–5.81)
RDW: 15.6 % — ABNORMAL HIGH (ref 11.5–15.5)
WBC: 3.4 10*3/uL — ABNORMAL LOW (ref 4.0–10.5)
nRBC: 0 % (ref 0.0–0.2)

## 2019-06-06 LAB — CMP (CANCER CENTER ONLY)
ALT: 16 U/L (ref 0–44)
AST: 15 U/L (ref 15–41)
Albumin: 3.6 g/dL (ref 3.5–5.0)
Alkaline Phosphatase: 115 U/L (ref 38–126)
Anion gap: 8 (ref 5–15)
BUN: 10 mg/dL (ref 6–20)
CO2: 24 mmol/L (ref 22–32)
Calcium: 9.1 mg/dL (ref 8.9–10.3)
Chloride: 105 mmol/L (ref 98–111)
Creatinine: 0.95 mg/dL (ref 0.61–1.24)
GFR, Est AFR Am: >60 mL/min (ref >60)
GFR, Estimated: >60 mL/min (ref >60)
Glucose, Bld: 128 mg/dL — ABNORMAL HIGH (ref 70–99)
Potassium: 4.9 mmol/L (ref 3.5–5.1)
Sodium: 137 mmol/L (ref 135–145)
Total Bilirubin: 0.2 mg/dL — ABNORMAL LOW (ref 0.3–1.2)
Total Protein: 6.9 g/dL (ref 6.5–8.1)

## 2019-06-06 MED ORDER — DEXTROSE 5 % IV SOLN
Freq: Once | INTRAVENOUS | Status: AC
  Administered 2019-06-06: 11:00:00 via INTRAVENOUS
  Filled 2019-06-06: qty 250

## 2019-06-06 MED ORDER — SODIUM CHLORIDE 0.9 % IV SOLN
Freq: Once | INTRAVENOUS | Status: AC
  Administered 2019-06-06: 12:00:00 via INTRAVENOUS
  Filled 2019-06-06: qty 5

## 2019-06-06 MED ORDER — PALONOSETRON HCL INJECTION 0.25 MG/5ML
INTRAVENOUS | Status: AC
  Filled 2019-06-06: qty 5

## 2019-06-06 MED ORDER — SODIUM CHLORIDE 0.9 % IV SOLN
1800.0000 mg/m2 | INTRAVENOUS | Status: DC
  Administered 2019-06-06: 3850 mg via INTRAVENOUS
  Filled 2019-06-06: qty 77

## 2019-06-06 MED ORDER — ONDANSETRON HCL 8 MG PO TABS: 8.0000 mg | ORAL_TABLET | Freq: Three times a day (TID) | ORAL | 1 refills | Status: DC | PRN

## 2019-06-06 MED ORDER — LEUCOVORIN CALCIUM INJECTION 350 MG
300.0000 mg/m2 | Freq: Once | INTRAVENOUS | Status: AC
  Administered 2019-06-06: 642 mg via INTRAVENOUS
  Filled 2019-06-06: qty 32.1

## 2019-06-06 MED ORDER — ATROPINE SULFATE 1 MG/ML IJ SOLN
0.5000 mg | Freq: Once | INTRAMUSCULAR | Status: AC | PRN
  Administered 2019-06-06: 0.5 mg via INTRAVENOUS

## 2019-06-06 MED ORDER — PALONOSETRON HCL INJECTION 0.25 MG/5ML
0.2500 mg | Freq: Once | INTRAVENOUS | Status: AC
  Administered 2019-06-06: 0.25 mg via INTRAVENOUS

## 2019-06-06 MED ORDER — ATROPINE SULFATE 1 MG/ML IJ SOLN
INTRAMUSCULAR | Status: AC
  Filled 2019-06-06: qty 1

## 2019-06-06 MED ORDER — MAGIC MOUTHWASH: 5.0000 mL | Freq: Four times a day (QID) | ORAL | 0 refills | Status: DC | PRN

## 2019-06-06 MED ORDER — OXALIPLATIN CHEMO INJECTION 100 MG/20ML
65.0000 mg/m2 | Freq: Once | INTRAVENOUS | Status: AC
  Administered 2019-06-06: 140 mg via INTRAVENOUS
  Filled 2019-06-06: qty 10

## 2019-06-06 MED ORDER — SODIUM CHLORIDE 0.9% FLUSH
10.0000 mL | INTRAVENOUS | Status: DC | PRN
  Administered 2019-06-06: 10 mL
  Filled 2019-06-06: qty 10

## 2019-06-06 MED ORDER — SODIUM CHLORIDE 0.9% FLUSH
10.0000 mL | INTRAVENOUS | Status: DC | PRN
  Filled 2019-06-06: qty 10

## 2019-06-06 MED ORDER — HEPARIN SOD (PORK) LOCK FLUSH 100 UNIT/ML IV SOLN
500.0000 [IU] | Freq: Once | INTRAVENOUS | Status: DC | PRN
  Filled 2019-06-06: qty 5

## 2019-06-06 MED ORDER — IRINOTECAN HCL CHEMO INJECTION 100 MG/5ML
120.0000 mg/m2 | Freq: Once | INTRAVENOUS | Status: AC
  Administered 2019-06-06: 260 mg via INTRAVENOUS
  Filled 2019-06-06: qty 13

## 2019-06-06 NOTE — Telephone Encounter (Signed)
Scheduled per los  °

## 2019-06-06 NOTE — Telephone Encounter (Signed)
TC per Lattie Haw to call in refills for Zofran and magic mouth wash for patient. Refills were called into [facility] in [place] [phone])

## 2019-06-06 NOTE — Progress Notes (Addendum)
[name]   Diagnosis: Pancreas cancer  INTERVAL HISTORY:   Brian Torres returns as scheduled.  He completed cycle 7 FOLFIRINOX 05/22/2019.  He continues to note consistent mild nausea.  The nausea increases after chemotherapy.  No mouth sores.  No diarrhea.  Cold sensitivity lasted 2 days.  No persistent neuropathy symptoms.  He reports a good appetite.  He denies abdominal pain.  He reports good control of blood sugars.  Objective:  Vital signs in last 24 hours:  Blood pressure 124/81, pulse 82, temperature 98.5 F (36.9 C), temperature source Oral, resp. rate 18, height 6\' 4"  (1.93 m), weight 205 lb (93 kg), SpO2 100 %.    GI: Abdomen soft and nontender.  No hepatomegaly.  No mass. Vascular: Edema right lower leg. Neuro: Vibratory sense minimally decreased over the fingertips per tuning fork exam. Skin: Palms with mild erythema, dryness. Port-A-Cath without erythema.   Lab Results:  Lab Results  Component Value Date   WBC 3.4 (L) 06/06/2019   HGB 10.5 (L) 06/06/2019   HCT 32.0 (L) 06/06/2019   MCV 95.8 06/06/2019   PLT 169 06/06/2019   NEUTROABS 1.6 (L) 06/06/2019    Imaging:  No results found.  Medications: I have reviewed the patient's current medications.  Assessment/Plan: 1. Pancreas head mass  Presented with jaundice, bilirubin elevated at 21.4  CTs abdomen/pelvis 12/20/2018-severe intrahepatic and extrahepatic biliary dilatation due to a possible 3.5 x 2.5 cm solid mass in the pancreatic head. Pancreatic ductal dilatation noted as well with multiple cystic lesions in the pancreatic tail and body.   CT chest 12/20/2018-occasional nonspecific small pulmonary nodules felt to likely be related to prior infection or inflammation.   ERCP by Dr. Watt Climes 12/21/2018. The major papilla was on the rim of a diverticulum. The minor papilla appeared to be bulging. A biopsy of the duodenum adjacent to the minor papilla was performed.  Plastic stent was placed into the ventral pancreatic duct. Biliary sphincterotomy performed. Covered metal stent placed into the common bile duct. Bile duct brushings showed benign reactive/reparative changes. Biopsy of polypoid duodenal mucosa suggestive of nodular peptic duodenitis, negative for dysplasia or malignancy.   Upper EUS by Dr. Paulita Fujita on 01/03/2019 showed a few cystic lesions in the pancreatic body and pancreatic tail. A stent was visualized in the common bile duct. Mass identified in the pancreatic head with fine-needle aspiration performed. Lesion appeared to abut or potentially superficially invade the superior mesenteric vein. No lymphadenopathy. No involvement of the superior mesenteric artery or celiac artery. Pathology returned suspicious for malignancy. Dr. Paulita Fujita notes if pathology shows adenocarcinoma the lesion would be staged T3 N0 MX.  Repeat ERCP and placement of a metal bile duct stent 01/24/2019  EUS 01/24/2019, T3N0 pancreas head mass with abutment of the SMV, FNA of the pancreas mass revealed adenocarcinoma, cytology from the bile duct stent revealed adenocarcinoma  Cycle 1 FOLFIRINOX4/04/2019  Cycle 2 FOLFIRINOX4/20/2020, Udenyca added  Cycle 3 FOLFIRINOX 03/09/2019, Irinotecan and 5-fluorouracil dose reduced, Udenyca held  Cycle 4 FOLFIRINOX 03/21/2019-Udenyca held  Cycle 5 FOLFIRINOX6/01/2019  Cycle 6 FOLFIRINOX 04/24/2019 (oxaliplatin dose reduced due to poor tolerance of chemotherapy)  CT 05/11/2019- decreased size of pancreas head mass, unchanged peripancreatic lymph node, no evidence of metastatic disease, improved/resolved fluid collections at the pancreas tail  Cycle 7 FOLFIRINOX 05/22/2019  Cycle 8 FOLFIRINOX 06/06/2019  2. Biliary obstruction status post stent placement 3. History of left lower extremity DVT 2017, 2018-on Eliquis. 4. Type 2 diabetes 5.  Hypertension 6. Chronic kidney disease 7. Tobacco use 8. Anemia, macrocytic. Transfused  2 units of blood 12/20/2018.On oral iron. Improved 02/01/2019 and 02/06/2019. 9. Port-A-Cath placement 02/02/2019, Dr. Barry Dienes 10. 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  He has completed 7 cycles of FOLFIRINOX.  Plan to proceed with cycle 8 today as scheduled.    We reviewed the CBC from today.  He has mild neutropenia.  He declines Udenyca on the day of pump discontinuation.  He understands the risk for progression of the neutropenia, infection.  We discussed delaying today's treatment, dose reduction of the chemotherapy.  He would like to proceed today as scheduled.  He will contact the office with fever, chills, other signs of infection.  He will return for a follow-up CBC on 06/16/2019.    He has an appointment with Dr. Barry Dienes later this week for surgery planning.    We will see him in follow-up in approximately 1 month.  Patient seen with Dr. Benay Spice.  [name] ANP/GNP-BC   06/06/2019  10:24 AM This was a shared visit with [name].  Brian Torres tolerated the last cycle of chemotherapy well.  He will complete a final cycle FOLFIRINOX today.  He declines G-CSF support.  He understands the risk for infection and will contact us for any fever.  He will return for a nadir CBC.  Julieanne Manson, MD

## 2019-06-06 NOTE — Patient Instructions (Signed)
[facility] Discharge Instructions for Patients Receiving Chemotherapy  Today you received the following chemotherapy agents Oxaliplatin (ELOXATIN), Leucovorin, Irinotecan (CAMPTOSAR) & Flourouracil (ADRUCIL).  To help prevent nausea and vomiting after your treatment, we encourage you to take your nausea medication as prescribed.   If you develop nausea and vomiting that is not controlled by your nausea medication, call the clinic.   BELOW ARE SYMPTOMS THAT SHOULD BE REPORTED IMMEDIATELY:  *FEVER GREATER THAN 100.5 F  *CHILLS WITH OR WITHOUT FEVER  NAUSEA AND VOMITING THAT IS NOT CONTROLLED WITH YOUR NAUSEA MEDICATION  *UNUSUAL SHORTNESS OF BREATH  *UNUSUAL BRUISING OR BLEEDING  TENDERNESS IN MOUTH AND THROAT WITH OR WITHOUT PRESENCE OF ULCERS  *URINARY PROBLEMS  *BOWEL PROBLEMS  UNUSUAL RASH Items with * indicate a potential emergency and should be followed up as soon as possible.  Feel free to call the clinic should you have any questions or concerns. The clinic phone number is [phone].  Please show the [facility] at check-in to the Emergency Department and triage nurse.  Coronavirus (COVID-19) Are you at risk?  Are you at risk for the Coronavirus (COVID-19)?  To be considered HIGH RISK for Coronavirus (COVID-19), you have to meet the following criteria:  . Traveled to [place], [place], [place], [place] or [place]; or in the [place] to [place], [place], [place], or [place]; and have fever, cough, and shortness of breath within the last 2 weeks of travel OR . Been in close contact with a person diagnosed with COVID-19 within the last 2 weeks and have fever, cough, and shortness of breath . IF YOU DO NOT MEET THESE CRITERIA, YOU ARE CONSIDERED LOW RISK FOR COVID-19.  What to do if you are HIGH RISK for COVID-19?  Marland Kitchen If you are having a medical emergency, call 911. . Seek medical care right away. Before you go to a doctor's office,  urgent care or emergency department, call ahead and tell them about your recent travel, contact with someone diagnosed with COVID-19, and your symptoms. You should receive instructions from your physician's office regarding next steps of care.  . When you arrive at healthcare provider, tell the healthcare staff immediately you have returned from visiting [place], [place], [place], [place] or [place]; or traveled in the [place] to [place], [place], [place], or [place]; in the last two weeks or you have been in close contact with a person diagnosed with COVID-19 in the last 2 weeks.   . Tell the health care staff about your symptoms: fever, cough and shortness of breath. . After you have been seen by a medical provider, you will be either: o Tested for (COVID-19) and discharged home on quarantine except to seek medical care if symptoms worsen, and asked to  - Stay home and avoid contact with others until you get your results (4-5 days)  - Avoid travel on public transportation if possible (such as bus, train, or airplane) or o Sent to the Emergency Department by EMS for evaluation, COVID-19 testing, and possible admission depending on your condition and test results.  What to do if you are LOW RISK for COVID-19?  Reduce your risk of any infection by using the same precautions used for avoiding the common cold or flu:  Marland Kitchen Wash your hands often with soap and warm water for at least 20 seconds.  If soap and water are not readily available, use an alcohol-based hand sanitizer with at least 60%  alcohol.  . If coughing or sneezing, cover your mouth and nose by coughing or sneezing into the elbow areas of your shirt or coat, into a tissue or into your sleeve (not your hands). . Avoid shaking hands with others and consider head nods or verbal greetings only. . Avoid touching your eyes, nose, or mouth with unwashed hands.  . Avoid close contact with people who are sick. . Avoid places or events with  large numbers of people in one location, like concerts or sporting events. . Carefully consider travel plans you have or are making. . If you are planning any travel outside or inside the [place], visit the CDC's Travelers' Health webpage for the latest health notices. . If you have some symptoms but not all symptoms, continue to monitor at home and seek medical attention if your symptoms worsen. . If you are having a medical emergency, call 911.   ADDITIONAL HEALTHCARE OPTIONS FOR PATIENTS  [facility] / e-Visit: [email or web address]         [facility]: [phone]  [facility]: [phone]                   [facility]: [phone]   

## 2019-06-07 LAB — CANCER ANTIGEN 19-9: CA 19-9: 78 U/mL — ABNORMAL HIGH (ref 0–35)

## 2019-06-08 ENCOUNTER — Other Ambulatory Visit: Payer: Self-pay

## 2019-06-08 ENCOUNTER — Inpatient Hospital Stay: Payer: Managed Care, Other (non HMO)

## 2019-06-08 VITALS — BP 128/78 | HR 78 | Temp 98.2°F | Resp 18

## 2019-06-08 DIAGNOSIS — C250 Malignant neoplasm of head of pancreas: Secondary | ICD-10-CM

## 2019-06-08 MED ORDER — HEPARIN SOD (PORK) LOCK FLUSH 100 UNIT/ML IV SOLN
500.0000 [IU] | Freq: Once | INTRAVENOUS | Status: AC | PRN
  Administered 2019-06-08: 500 [IU]
  Filled 2019-06-08: qty 5

## 2019-06-08 MED ORDER — SODIUM CHLORIDE 0.9% FLUSH
10.0000 mL | INTRAVENOUS | Status: DC | PRN
  Administered 2019-06-08: 10 mL
  Filled 2019-06-08: qty 10

## 2019-06-09 ENCOUNTER — Other Ambulatory Visit: Payer: Self-pay | Admitting: General Surgery

## 2019-06-16 ENCOUNTER — Encounter: Payer: Self-pay | Admitting: *Deleted

## 2019-06-16 ENCOUNTER — Inpatient Hospital Stay: Payer: Managed Care, Other (non HMO) | Admitting: Oncology

## 2019-06-16 ENCOUNTER — Telehealth: Payer: Self-pay | Admitting: *Deleted

## 2019-06-16 ENCOUNTER — Other Ambulatory Visit: Payer: Self-pay

## 2019-06-16 ENCOUNTER — Inpatient Hospital Stay: Payer: Managed Care, Other (non HMO)

## 2019-06-16 DIAGNOSIS — Z95828 Presence of other vascular implants and grafts: Secondary | ICD-10-CM

## 2019-06-16 DIAGNOSIS — C250 Malignant neoplasm of head of pancreas: Secondary | ICD-10-CM

## 2019-06-16 LAB — CBC WITH DIFFERENTIAL (CANCER CENTER ONLY)
Abs Immature Granulocytes: 0.01 10*3/uL (ref 0.00–0.07)
Basophils Absolute: 0 10*3/uL (ref 0.0–0.1)
Basophils Relative: 1 %
Eosinophils Absolute: 0.1 10*3/uL (ref 0.0–0.5)
Eosinophils Relative: 2 %
HCT: 31.3 % — ABNORMAL LOW (ref 39.0–52.0)
Hemoglobin: 10.6 g/dL — ABNORMAL LOW (ref 13.0–17.0)
Immature Granulocytes: 0 %
Lymphocytes Relative: 35 %
Lymphs Abs: 1.3 10*3/uL (ref 0.7–4.0)
MCH: 31.7 pg (ref 26.0–34.0)
MCHC: 33.9 g/dL (ref 30.0–36.0)
MCV: 93.7 fL (ref 80.0–100.0)
Monocytes Absolute: 0.4 10*3/uL (ref 0.1–1.0)
Monocytes Relative: 12 %
Neutro Abs: 1.8 10*3/uL (ref 1.7–7.7)
Neutrophils Relative %: 50 %
Platelet Count: 159 10*3/uL (ref 150–400)
RBC: 3.34 MIL/uL — ABNORMAL LOW (ref 4.22–5.81)
RDW: 15.1 % (ref 11.5–15.5)
WBC Count: 3.7 10*3/uL — ABNORMAL LOW (ref 4.0–10.5)
nRBC: 0 % (ref 0.0–0.2)

## 2019-06-16 MED ORDER — HEPARIN SOD (PORK) LOCK FLUSH 100 UNIT/ML IV SOLN
500.0000 [IU] | Freq: Once | INTRAVENOUS | Status: AC | PRN
  Administered 2019-06-16: 10:00:00 500 [IU]
  Filled 2019-06-16: qty 5

## 2019-06-16 MED ORDER — SODIUM CHLORIDE 0.9% FLUSH
10.0000 mL | INTRAVENOUS | Status: DC | PRN
  Administered 2019-06-16: 10 mL
  Filled 2019-06-16: qty 10

## 2019-06-16 NOTE — Telephone Encounter (Signed)
Left VM with CBC results and that too soon to check his CA 19.9.

## 2019-06-16 NOTE — Progress Notes (Unsigned)
Phlebotomist, [facility] inquiring if he is due genetics testing? Saw an order for this that has not been done. Also patient asking for CA 19.9 today. Left message with genetics counselor to confirm if that was an old order. According to office note, it appears it has already been in past. Made decision not to process this. IF still needed, can add later. Also per [name], NP--too soon to check his CA 19.9 again (just done on 06/06/19).

## 2019-06-16 NOTE — Telephone Encounter (Signed)
-----   Message from Owens Shark, NP sent at 06/16/2019 12:15 PM EDT ----- Please let him know the neutrophil count is better.  Follow-up as scheduled.

## 2019-07-07 ENCOUNTER — Other Ambulatory Visit: Payer: Self-pay

## 2019-07-07 ENCOUNTER — Inpatient Hospital Stay: Payer: Managed Care, Other (non HMO) | Attending: Oncology | Admitting: Oncology

## 2019-07-07 ENCOUNTER — Telehealth: Payer: Self-pay | Admitting: Oncology

## 2019-07-07 ENCOUNTER — Encounter: Payer: Self-pay | Admitting: *Deleted

## 2019-07-07 VITALS — BP 150/88 | HR 81 | Temp 98.7°F | Resp 18 | Ht 76.0 in | Wt 209.0 lb

## 2019-07-07 DIAGNOSIS — Z86718 Personal history of other venous thrombosis and embolism: Secondary | ICD-10-CM | POA: Insufficient documentation

## 2019-07-07 DIAGNOSIS — I129 Hypertensive chronic kidney disease with stage 1 through stage 4 chronic kidney disease, or unspecified chronic kidney disease: Secondary | ICD-10-CM | POA: Diagnosis not present

## 2019-07-07 DIAGNOSIS — N189 Chronic kidney disease, unspecified: Secondary | ICD-10-CM | POA: Insufficient documentation

## 2019-07-07 DIAGNOSIS — Z7901 Long term (current) use of anticoagulants: Secondary | ICD-10-CM | POA: Diagnosis not present

## 2019-07-07 DIAGNOSIS — E1122 Type 2 diabetes mellitus with diabetic chronic kidney disease: Secondary | ICD-10-CM | POA: Diagnosis not present

## 2019-07-07 DIAGNOSIS — Z9221 Personal history of antineoplastic chemotherapy: Secondary | ICD-10-CM | POA: Diagnosis not present

## 2019-07-07 DIAGNOSIS — G893 Neoplasm related pain (acute) (chronic): Secondary | ICD-10-CM | POA: Diagnosis not present

## 2019-07-07 DIAGNOSIS — C250 Malignant neoplasm of head of pancreas: Secondary | ICD-10-CM | POA: Diagnosis present

## 2019-07-07 DIAGNOSIS — F17210 Nicotine dependence, cigarettes, uncomplicated: Secondary | ICD-10-CM | POA: Diagnosis not present

## 2019-07-07 NOTE — Telephone Encounter (Signed)
Called and spoke with patient. Confirmed appt  °

## 2019-07-07 NOTE — Progress Notes (Signed)
Patient presented with FMLA paperwork for his wife for completion. Forwarded to inbox for FMLA/Disability nurse to complete.

## 2019-07-07 NOTE — Progress Notes (Signed)
[facility] OFFICE PROGRESS NOTE   Diagnosis: Pancreas cancer  INTERVAL HISTORY:   Brian Torres returns as scheduled.  He completed a final cycle of neoadjuvant chemotherapy on 06/06/2019.  He reports tolerating chemotherapy well.  No neuropathy symptoms.  Good appetite.  He reports better control of his blood sugar.  He vacationed in [place] for the past 3 weeks and returned yesterday.  He had mild pain in the lower back while in [place].  This is relieved with Tylenol. He is scheduled for surgery in 2 weeks.  Objective:  Vital signs in last 24 hours:  Blood pressure (!) 150/88, pulse 81, temperature 98.7 F (37.1 C), temperature source Oral, resp. rate 18, height 6\' 4"  (1.93 m), weight 209 lb (94.8 kg), SpO2 100 %.      Lymphatics: No cervical, supraclavicular, axillary, or inguinal nodes GI: No hepatosplenomegaly, no mass, nontender Vascular: Mild swelling of the left lower leg  Portacath/PICC-without erythema  Lab Results:  Lab Results  Component Value Date   WBC 3.7 (L) 06/16/2019   HGB 10.6 (L) 06/16/2019   HCT 31.3 (L) 06/16/2019   MCV 93.7 06/16/2019   PLT 159 06/16/2019   NEUTROABS 1.8 06/16/2019    CMP  Lab Results  Component Value Date   NA 137 06/06/2019   K 4.9 06/06/2019   CL 105 06/06/2019   CO2 24 06/06/2019   GLUCOSE 128 (H) 06/06/2019   BUN 10 06/06/2019   CREATININE 0.95 06/06/2019   CALCIUM 9.1 06/06/2019   PROT 6.9 06/06/2019   ALBUMIN 3.6 06/06/2019   AST 15 06/06/2019   ALT 16 06/06/2019   ALKPHOS 115 06/06/2019   BILITOT 0.2 (L) 06/06/2019   GFRNONAA >60 06/06/2019   GFRAA >60 06/06/2019    No results found for: CEA1  Lab Results  Component Value Date   INR 1.22 12/20/2018    Imaging:  No results found.  Medications: I have reviewed the patient's current medications.   Assessment/Plan: 1. Pancreas head mass  Presented with jaundice, bilirubin elevated at 21.4  CTs abdomen/pelvis 12/20/2018-severe  intrahepatic and extrahepatic biliary dilatation due to a possible 3.5 x 2.5 cm solid mass in the pancreatic head. Pancreatic ductal dilatation noted as well with multiple cystic lesions in the pancreatic tail and body.   CT chest 12/20/2018-occasional nonspecific small pulmonary nodules felt to likely be related to prior infection or inflammation.   ERCP by Dr. Watt Climes 12/21/2018. The major papilla was on the rim of a diverticulum. The minor papilla appeared to be bulging. A biopsy of the duodenum adjacent to the minor papilla was performed. Plastic stent was placed into the ventral pancreatic duct. Biliary sphincterotomy performed. Covered metal stent placed into the common bile duct. Bile duct brushings showed benign reactive/reparative changes. Biopsy of polypoid duodenal mucosa suggestive of nodular peptic duodenitis, negative for dysplasia or malignancy.   Upper EUS by Dr. Paulita Fujita on 01/03/2019 showed a few cystic lesions in the pancreatic body and pancreatic tail. A stent was visualized in the common bile duct. Mass identified in the pancreatic head with fine-needle aspiration performed. Lesion appeared to abut or potentially superficially invade the superior mesenteric vein. No lymphadenopathy. No involvement of the superior mesenteric artery or celiac artery. Pathology returned suspicious for malignancy. Dr. Paulita Fujita notes if pathology shows adenocarcinoma the lesion would be staged T3 N0 MX.  Repeat ERCP and placement of a metal bile duct stent 01/24/2019  EUS 01/24/2019, T3N0 pancreas head mass with abutment of the SMV, FNA of  the pancreas mass revealed adenocarcinoma, cytology from the bile duct stent revealed adenocarcinoma  Cycle 1 FOLFIRINOX4/04/2019  Cycle 2 FOLFIRINOX4/20/2020, Udenyca added  Cycle 3 FOLFIRINOX 03/09/2019, Irinotecan and 5-fluorouracil dose reduced, Udenyca held  Cycle 4 FOLFIRINOX 03/21/2019-Udenyca held  Cycle 5 FOLFIRINOX6/01/2019  Cycle 6 FOLFIRINOX  04/24/2019 (oxaliplatin dose reduced due to poor tolerance of chemotherapy)  CT 05/11/2019- decreased size of pancreas head mass, unchanged peripancreatic lymph node, no evidence of metastatic disease, improved/resolved fluid collections at the pancreas tail  Cycle 7 FOLFIRINOX 05/22/2019  Cycle 8 FOLFIRINOX 06/06/2019  2. Biliary obstruction status post stent placement 3. History of left lower extremity DVT 2017, 2018-on Eliquis. 4. Type 2 diabetes 5. Hypertension 6. Chronic kidney disease 7. Tobacco use 8. Anemia, macrocytic. Transfused 2 units of blood 12/20/2018.On oral iron. Improved 02/01/2019 and 02/06/2019. 9. Port-A-Cath placement 02/02/2019, Dr. Barry Dienes 10. 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  He is scheduled to undergo pancreas surgery in approximately 2 weeks.  His performance status appears improved.  The back pain is most likely related to a benign musculoskeletal condition, though this could be related to pancreas cancer.  He will return for an office visit and Port-A-Cath flush after surgery.  Brian Coder, MD  07/07/2019  10:09 AM

## 2019-07-11 NOTE — Pre-Procedure Instructions (Signed)
[place], [address] [place] [address] Phone: [phone] Fax: [phone]      Your procedure is scheduled on Thursday September 17th.  Report to [facility] Main Entrance "A" at 5:30 A.M., and check in at the Admitting office.  Call this number if you have problems the morning of surgery:  [phone]  Call [phone] if you have any questions prior to your surgery date Monday-Friday 8am-4pm    Remember:  Do not eat or drink after midnight the night before your surgery  You may drink clear liquids until 4:30 AM the morning of your surgery.   Clear liquids allowed are: Water, Non-Citrus Juices (without pulp), Carbonated Beverages, Clear Tea, Black Coffee Only, and Gatorade    Take these medicines the morning of surgery with A SIP OF WATER  famotidine (PEPCID AC)  acetaminophen (TYLENOL)- if needed LORazepam (ATIVAN)-if needed prochlorperazine (COMPAZINE)-if needed   HOW TO MANAGE YOUR DIABETES BEFORE AND AFTER SURGERY  Why is it important to control my blood sugar before and after surgery? . Improving blood sugar levels before and after surgery helps healing and can limit problems. . A way of improving blood sugar control is eating a healthy diet by: o  Eating less sugar and carbohydrates o  Increasing activity/exercise o  Talking with your doctor about reaching your blood sugar goals . High blood sugars (greater than 180 mg/dL) can raise your risk of infections and slow your recovery, so you will need to focus on controlling your diabetes during the weeks before surgery. . Make sure that the doctor who takes care of your diabetes knows about your planned surgery including the date and location.  How do I manage my blood sugar before surgery? . Check your blood sugar at least 4 times a day, starting 2 days before surgery, to make sure that the level is not too high or low. o Check your blood sugar  the morning of your surgery when you wake up and every 2 hours until you get to the Short Stay unit. . If your blood sugar is less than 70 mg/dL, you will need to treat for low blood sugar: o Do not take insulin. o Treat a low blood sugar (less than 70 mg/dL) with  cup of clear juice (cranberry or apple), 4 glucose tablets, OR glucose gel. Recheck blood sugar in 15 minutes after treatment (to make sure it is greater than 70 mg/dL). If your blood sugar is not greater than 70 mg/dL on recheck, call [phone] o  for further instructions. . Report your blood sugar to the short stay nurse when you get to Short Stay.  . If you are admitted to the hospital after surgery: o Your blood sugar will be checked by the staff and you will probably be given insulin after surgery (instead of oral diabetes medicines) to make sure you have good blood sugar levels. o The goal for blood sugar control after surgery is 80-180 mg/dL.     WHAT DO I DO ABOUT MY DIABETES MEDICATION?   Marland Kitchen Do not take oral diabetes medicines (pills): glimepiride (AMARYL), metFORMIN (GLUCOPHAGE) or pioglitazone (ACTOS) the morning of surgery.  . THE NIGHT BEFORE SURGERY, take 9 units of Insulin Glargine (BASAGLAR KWIKPEN) insulin. Do NOT take bedtime dose of HUMALOG KWIKPEN    . The day of surgery, do not take other diabetes injectables, including Byetta (exenatide), Bydureon (exenatide ER), Victoza (liraglutide), or Trulicity (dulaglutide).  Follow your surgeon's instructions on when to stop apixaban (ELIQUIS).  If no instructions were given by your surgeon then you will need to call the office to get those instructions.     7 days prior to surgery STOP taking any Aspirin (unless otherwise instructed by your surgeon), Aleve, Naproxen, Ibuprofen, Motrin, Advil, Goody's, BC's, all herbal medications, fish oil, and all vitamins.    The Morning of Surgery  Do not wear jewelry, make-up or nail polish.  Do not wear lotions,  powders, or perfumes/colognes, or deodorant  Do not shave 48 hours prior to surgery.  Men may shave face and neck.  Do not bring valuables to the hospital.  [facility] is not responsible for any belongings or valuables.  If you are a smoker, DO NOT Smoke 24 hours prior to surgery IF you wear a CPAP at night please bring your mask, tubing, and machine the morning of surgery   Remember that you must have someone to transport you home after your surgery, and remain with you for 24 hours if you are discharged the same day.   Contacts, glasses, hearing aids, dentures or bridgework may not be worn into surgery.    Leave your suitcase in the car.  After surgery it may be brought to your room.  For patients admitted to the hospital, discharge time will be determined by your treatment team.  Patients discharged the day of surgery will not be allowed to drive home.    Special instructions:   - Preparing For Surgery  Before surgery, you can play an important role. Because skin is not sterile, your skin needs to be as free of germs as possible. You can reduce the number of germs on your skin by washing with CHG (chlorahexidine gluconate) Soap before surgery.  CHG is an antiseptic cleaner which kills germs and bonds with the skin to continue killing germs even after washing.    Oral Hygiene is also important to reduce your risk of infection.  Remember - BRUSH YOUR TEETH THE MORNING OF SURGERY WITH YOUR REGULAR TOOTHPASTE  Please do not use if you have an allergy to CHG or antibacterial soaps. If your skin becomes reddened/irritated stop using the CHG.  Do not shave (including legs and underarms) for at least 48 hours prior to first CHG shower. It is OK to shave your face.  Please follow these instructions carefully.   1. Shower the NIGHT BEFORE SURGERY and the MORNING OF SURGERY with CHG Soap.   2. If you chose to wash your hair, wash your hair first as usual with your normal  shampoo.  3. After you shampoo, rinse your hair and body thoroughly to remove the shampoo.  4. Use CHG as you would any other liquid soap. You can apply CHG directly to the skin and wash gently with a scrungie or a clean washcloth.   5. Apply the CHG Soap to your body ONLY FROM THE NECK DOWN.  Do not use on open wounds or open sores. Avoid contact with your eyes, ears, mouth and genitals (private parts). Wash Face and genitals (private parts)  with your normal soap.   6. Wash thoroughly, paying special attention to the area where your surgery will be performed.  7. Thoroughly rinse your body with warm water from the neck down.  8. DO NOT shower/wash with your normal soap after using and rinsing off the CHG Soap.  9. Pat yourself dry with a CLEAN TOWEL.  10. Wear CLEAN  PAJAMAS to bed the night before surgery, wear comfortable clothes the morning of surgery  11. Place CLEAN SHEETS on your bed the night of your first shower and DO NOT SLEEP WITH PETS.    Day of Surgery:  Do not apply any deodorants/lotions. Please shower the morning of surgery with the CHG soap  Please wear clean clothes to the hospital/surgery center.   Remember to brush your teeth WITH YOUR REGULAR TOOTHPASTE.   Please read over the following fact sheets that you were given.

## 2019-07-12 ENCOUNTER — Other Ambulatory Visit: Payer: Self-pay

## 2019-07-12 ENCOUNTER — Encounter (HOSPITAL_COMMUNITY)
Admission: RE | Admit: 2019-07-12 | Discharge: 2019-07-12 | Disposition: A | Discharge status: Home / Self Care | Payer: Managed Care, Other (non HMO) | Source: Ambulatory Visit | Attending: General Surgery | Admitting: General Surgery

## 2019-07-12 DIAGNOSIS — Z01812 Encounter for preprocedural laboratory examination: Secondary | ICD-10-CM | POA: Diagnosis present

## 2019-07-12 LAB — COMPREHENSIVE METABOLIC PANEL
ALT: 18 U/L (ref 0–44)
AST: 20 U/L (ref 15–41)
Albumin: 3.6 g/dL (ref 3.5–5.0)
Alkaline Phosphatase: 100 U/L (ref 38–126)
Anion gap: 9 (ref 5–15)
BUN: 8 mg/dL (ref 6–20)
CO2: 24 mmol/L (ref 22–32)
Calcium: 9.4 mg/dL (ref 8.9–10.3)
Chloride: 105 mmol/L (ref 98–111)
Creatinine, Ser: 0.92 mg/dL (ref 0.61–1.24)
GFR calc Af Amer: >60 mL/min (ref >60)
GFR calc non Af Amer: >60 mL/min (ref >60)
Glucose, Bld: 115 mg/dL — ABNORMAL HIGH (ref 70–99)
Potassium: 5.3 mmol/L — ABNORMAL HIGH (ref 3.5–5.1)
Sodium: 138 mmol/L (ref 135–145)
Total Bilirubin: 0.3 mg/dL (ref 0.3–1.2)
Total Protein: 6.8 g/dL (ref 6.5–8.1)

## 2019-07-12 LAB — CBC WITH DIFFERENTIAL/PLATELET
Abs Immature Granulocytes: 0.04 10*3/uL (ref 0.00–0.07)
Basophils Absolute: 0 10*3/uL (ref 0.0–0.1)
Basophils Relative: 1 %
Eosinophils Absolute: 0.2 10*3/uL (ref 0.0–0.5)
Eosinophils Relative: 4 %
HCT: 38 % — ABNORMAL LOW (ref 39.0–52.0)
Hemoglobin: 12.2 g/dL — ABNORMAL LOW (ref 13.0–17.0)
Immature Granulocytes: 1 %
Lymphocytes Relative: 26 %
Lymphs Abs: 1.3 10*3/uL (ref 0.7–4.0)
MCH: 30.9 pg (ref 26.0–34.0)
MCHC: 32.1 g/dL (ref 30.0–36.0)
MCV: 96.2 fL (ref 80.0–100.0)
Monocytes Absolute: 0.6 10*3/uL (ref 0.1–1.0)
Monocytes Relative: 12 %
Neutro Abs: 2.7 10*3/uL (ref 1.7–7.7)
Neutrophils Relative %: 56 %
Platelets: 196 10*3/uL (ref 150–400)
RBC: 3.95 MIL/uL — ABNORMAL LOW (ref 4.22–5.81)
RDW: 14.4 % (ref 11.5–15.5)
WBC: 4.8 10*3/uL (ref 4.0–10.5)
nRBC: 0 % (ref 0.0–0.2)

## 2019-07-12 LAB — PREPARE RBC (CROSSMATCH)

## 2019-07-12 LAB — GLUCOSE, CAPILLARY: Glucose-Capillary: 130 mg/dL — ABNORMAL HIGH (ref 70–99)

## 2019-07-12 LAB — PROTIME-INR
INR: 1 (ref 0.8–1.2)
Prothrombin Time: 13.3 seconds (ref 11.4–15.2)

## 2019-07-12 LAB — ABO/RH: ABO/RH(D): O POS

## 2019-07-12 NOTE — Progress Notes (Signed)
PCP - Dr. Leonides Schanz Cardiologist - denies  Chest x-ray - N/A EKG - 02/01/19 Stress Test - denies  ECHO - denies Cardiac Cath - denies  Sleep Study - denies  Fasting Blood Sugar - 80-125 Checks Blood Sugar 2 times a day  Blood Thinner Instructions: per Abigail Butts with Dr. Barry Dienes, pt to hold Eliquis 2 days prior to surgery. Last dose 07/17/19  Pt also given specific bowel prep instructions per Abigail Butts- 32 oz of gatorade and 1/2 bottle Miralax. Instructions provided to patient and education given.   Anesthesia review:   Patient denies shortness of breath, fever, cough and chest pain at PAT appointment   Patient verbalized understanding of instructions that were given to them at the PAT appointment. Patient was also instructed that they will need to review over the PAT instructions again at home before surgery.

## 2019-07-13 LAB — HEMOGLOBIN A1C
Hgb A1c MFr Bld: 8.6 % — ABNORMAL HIGH (ref 4.8–5.6)
Mean Plasma Glucose: 200 mg/dL

## 2019-07-14 ENCOUNTER — Telehealth: Payer: Self-pay | Admitting: *Deleted

## 2019-07-14 ENCOUNTER — Encounter: Payer: Self-pay | Admitting: Nurse Practitioner

## 2019-07-14 NOTE — Telephone Encounter (Signed)
Called to follow up on status of FMLA papers from [employer]. They have not received them yet from our office. Were left on 07/07/2019. Informed him that disability specialist has the forms and they are not ready yet. Reminded him that office policy is 0000000 business days for forms.

## 2019-07-17 ENCOUNTER — Encounter: Payer: Self-pay | Admitting: Nurse Practitioner

## 2019-07-17 ENCOUNTER — Other Ambulatory Visit (HOSPITAL_COMMUNITY)
Admission: RE | Admit: 2019-07-17 | Discharge: 2019-07-17 | Disposition: A | Discharge status: Home / Self Care | Payer: Managed Care, Other (non HMO) | Source: Ambulatory Visit | Attending: General Surgery | Admitting: General Surgery

## 2019-07-17 DIAGNOSIS — Z20828 Contact with and (suspected) exposure to other viral communicable diseases: Secondary | ICD-10-CM | POA: Insufficient documentation

## 2019-07-17 DIAGNOSIS — Z01812 Encounter for preprocedural laboratory examination: Secondary | ICD-10-CM | POA: Diagnosis present

## 2019-07-17 NOTE — Telephone Encounter (Signed)
Cecille Rubin I see a note that reads form received 07-07-2019.  Will these be ready by Friday, 07-21-2019?

## 2019-07-17 NOTE — H&P (Signed)
Brian Torres Location: [facility] Patient #: [id number] DOB: Feb 05, 1961 Married / Language: English / Race: White Male   History of Present Illness  The patient is a 58 year old male who presents for a Follow-up for Pancreatic cancer.Patient is a 58 year old male who presented on February 2020 with jaundice. He is referred to me by Dr. Benay Spice for consultation regarding diagnosis of pancreatic cancer. This was found based on imaging with his presentation of jaundice. He did have some weight loss due to decreased appetite. He has been gaining some of this weight back. He underwent ERCP and EUS. Original pathology was not diagnostic. His stent clogged and he had switch out for metal stent. Cytology on second study was positive for adenocarcinoma. He does appear to have abutment/invasion of the superior mesenteric vein. The mass is approximately 3.5 cm in greatest dimension. There is significant dilatation of the pancreatic duct. He denies diarrhea. His diabetes has been going a little bit crazy. He denies any family history of cancer.    Pt has been undergoing FOLFIRINOX and received cycle 8. Restaging scans showed good response. CA 19-9 was as high as 658. Now 42. He has had genetic testing which was negative. He is here to discuss surgery.  CT 05/11/19 IMPRESSION: Improving pancreatic head mass, now measuring approximately 1.9 cm, as above.  8 mm short axis peripancreatic node, unchanged.  No evidence of distant metastases.  Indwelling common duct stent.   Allergies No Known Drug Allergies  [01/27/2019]: Allergies Reconciled   Medication History Eliquis (5MG  Tablet, Oral) Active. Rosuvastatin Calcium (5MG  Tablet, Oral) Active. Lisinopril (10MG  Tablet, Oral) Active. Pioglitazone HCl (45MG  Tablet, Oral) Active. Glimepiride (4MG  Tablet, Oral) Active. Omega 3 (1000MG  Capsule, Oral) Active. Co Q-10 (30MG  Capsule, Oral) Active. Ferrous Sulfate  (325 (65 Fe)MG Tablet, Oral) Active. Zofran (4MG  Tablet, Oral) Active. Multi-Vitamin (Oral) Active. Compazine (10MG  Tablet, Oral) Active. Sildenafil Citrate (20MG  Tablet, Oral) Active. Medications Reconciled    Review of Systems All other systems negative  Vitals  Weight: 203.4 lb Height: 76in Body Surface Area: 2.23 m Body Mass Index: 24.76 kg/m  Temp.: 97.38F  Pulse: 102 (Regular)  BP: 138/84(Sitting, Left Arm, Standard)       Physical Exam General Mental Status-Alert. General Appearance-Consistent with stated age. Hydration-Well hydrated. Voice-Normal.  Head and Neck Head-normocephalic, atraumatic with no lesions or palpable masses.  Eye Sclera/Conjunctiva - Bilateral-No scleral icterus.  Chest and Lung Exam Chest and lung exam reveals -quiet, even and easy respiratory effort with no use of accessory muscles. Inspection Chest Wall - Normal. Back - normal.  Breast - Did not examine.  Cardiovascular Cardiovascular examination reveals -normal pedal pulses bilaterally. Note: regular rate and rhythm  Abdomen Inspection-Inspection Normal. Palpation/Percussion Palpation and Percussion of the abdomen reveal - Soft, Non Tender, No Rebound tenderness, No Rigidity (guarding) and No hepatosplenomegaly.  Peripheral Vascular Upper Extremity Inspection - Bilateral - Normal - No Clubbing, No Cyanosis, No Edema, Pulses Intact. Lower Extremity Palpation - Edema - Bilateral - No edema - Bilateral.  Neurologic Neurologic evaluation reveals -alert and oriented x 3 with no impairment of recent or remote memory. Mental Status-Normal.  Musculoskeletal Global Assessment -Note: no gross deformities.  Normal Exam - Left-Upper Extremity Strength Normal and Lower Extremity Strength Normal. Normal Exam - Right-Upper Extremity Strength Normal and Lower Extremity Strength Normal.  Lymphatic Head & Neck  General Head & Neck  Lymphatics: Bilateral - Description - Normal. Axillary  General Axillary Region: Bilateral - Description - Normal. Tenderness -  Non Tender.    Assessment & Plan   ADENOCARCINOMA OF HEAD OF PANCREAS (C25.0) Impression: Good response to chemo. Plan surgery. Will do dx lap and whipple, J tube placement. 32 min spent in evaluation, examination, counseling, and coordination of care. >50% spent in counseling.  I discussed the surgery with the patient including diagrams of anatomy. I discussed the potential for diagnostic laparoscopy. In the case of pancreatic cancer, if spread of the disease is found, we will abort the procedure and not proceed with resection. The rationale for this was discussed with the patient. There has not been data to support resection of Stage IV disease in terms of survival benefit.  We discussed possible complications including: Potential of aborting procedure if tumor is invading the superior mesenteric or hepatic arteries Bleeding Infection and possible wound complications such as hernia Damage to adjacent structures Leak of anastamoses, primarily pancreatic Possible need for other procedures Possible prolonged nausea with possible need for external feeding. Possible prolonged hospital stay. Possible development of diabetes or worsening of current diabetes. Possible pancreatic exocrine insufficiency Prolonged fatigue/weakness/appetite Possible early recurrence of cancer   The patient understands and wishes to proceed. The patient has been advised to turn in disability paperwork to our office.  Current Plans Pt Education - flb whipple pt info You are being scheduled for surgery- Our schedulers will call you.  You should hear from our office's scheduling department within 5 working days about the location, date, and time of surgery. We try to make accommodations for patient's preferences in scheduling surgery, but sometimes the OR schedule or the surgeon's  schedule prevents [place] from making those accommodations.  If you have not heard from our office [phone]) in 5 working days, call the office and ask for your surgeon's nurse.  If you have other questions about your diagnosis, plan, or surgery, call the office and ask for your surgeon's nurse.  Pt Education - [facility] Colon Bowel Prep 2018 ERAS/Miralax/Antibiotics  ANTICOAGULATED BY ANTICOAGULATION TREATMENT (Z79.01) Impression: hold eliquis pre op 48 hours

## 2019-07-18 LAB — NOVEL CORONAVIRUS, NAA (HOSP ORDER, SEND-OUT TO REF LAB; TAT 18-24 HRS): SARS-CoV-2, NAA: NOT DETECTED

## 2019-07-19 ENCOUNTER — Encounter: Payer: Self-pay | Admitting: *Deleted

## 2019-07-19 NOTE — Progress Notes (Signed)
MD completed FMLA forms. Completed forms placed in inbox for managed care to process.

## 2019-07-19 NOTE — Anesthesia Preprocedure Evaluation (Signed)
Anesthesia Evaluation  Patient identified by MRN, date of birth, ID band Patient awake    Reviewed: Allergy & Precautions, NPO status , Patient's Chart, lab work & pertinent test results  Airway Mallampati: II  TM Distance: >3 FB Neck ROM: Full  Mouth opening: Limited Mouth Opening  Dental no notable dental hx. (+) Teeth Intact, Dental Advisory Given   Pulmonary Current Smoker,    Pulmonary exam normal breath sounds clear to auscultation       Cardiovascular hypertension, Pt. on medications and Pt. on home beta blockers + DVT (on eliquis)  Normal cardiovascular exam Rhythm:Regular Rate:Normal     Neuro/Psych PSYCHIATRIC DISORDERS Depression    GI/Hepatic   Endo/Other  diabetes, Type 2, Oral Hypoglycemic Agents, Insulin Dependent  Renal/GU Renal InsufficiencyRenal disease     Musculoskeletal   Abdominal   Peds  Hematology  (+) Blood dyscrasia, anemia ,   Anesthesia Other Findings Pancreatic cancer  Reproductive/Obstetrics                             Anesthesia Physical  Anesthesia Plan  ASA: III  Anesthesia Plan: General and Regional   Post-op Pain Management: GA combined w/ Regional for post-op pain   Induction: Intravenous  PONV Risk Score and Plan: 1 and Dexamethasone, Ondansetron and Midazolam  Airway Management Planned: Oral ETT  Additional Equipment: Arterial line and CVP  Intra-op Plan:   Post-operative Plan: Possible Post-op intubation/ventilation  Informed Consent: I have reviewed the patients History and Physical, chart, labs and discussed the procedure including the risks, benefits and alternatives for the proposed anesthesia with the patient or authorized representative who has indicated his/her understanding and acceptance.     Dental advisory given  Plan Discussed with: CRNA, Anesthesiologist and Surgeon  Anesthesia Plan Comments:         Anesthesia  Quick Evaluation

## 2019-07-20 ENCOUNTER — Encounter (HOSPITAL_COMMUNITY)
Admission: RE | Disposition: A | Discharge status: Home Health | Payer: Self-pay | Source: Ambulatory Visit | Attending: General Surgery

## 2019-07-20 ENCOUNTER — Inpatient Hospital Stay (HOSPITAL_COMMUNITY): Payer: Managed Care, Other (non HMO)

## 2019-07-20 ENCOUNTER — Other Ambulatory Visit: Payer: Self-pay

## 2019-07-20 ENCOUNTER — Encounter (HOSPITAL_COMMUNITY): Payer: Self-pay | Admitting: *Deleted

## 2019-07-20 ENCOUNTER — Inpatient Hospital Stay (HOSPITAL_COMMUNITY)
Admission: RE | Admit: 2019-07-20 | Discharge: 2019-07-29 | DRG: 406 | Disposition: A | Discharge status: Home Health | Payer: Managed Care, Other (non HMO) | Source: Ambulatory Visit | Attending: General Surgery | Admitting: General Surgery

## 2019-07-20 ENCOUNTER — Inpatient Hospital Stay (HOSPITAL_COMMUNITY): Payer: Managed Care, Other (non HMO) | Admitting: Anesthesiology

## 2019-07-20 DIAGNOSIS — C250 Malignant neoplasm of head of pancreas: Principal | ICD-10-CM | POA: Diagnosis present

## 2019-07-20 DIAGNOSIS — I129 Hypertensive chronic kidney disease with stage 1 through stage 4 chronic kidney disease, or unspecified chronic kidney disease: Secondary | ICD-10-CM | POA: Diagnosis present

## 2019-07-20 DIAGNOSIS — Z7901 Long term (current) use of anticoagulants: Secondary | ICD-10-CM | POA: Diagnosis not present

## 2019-07-20 DIAGNOSIS — C784 Secondary malignant neoplasm of small intestine: Secondary | ICD-10-CM | POA: Diagnosis present

## 2019-07-20 DIAGNOSIS — Z7984 Long term (current) use of oral hypoglycemic drugs: Secondary | ICD-10-CM

## 2019-07-20 DIAGNOSIS — Z86718 Personal history of other venous thrombosis and embolism: Secondary | ICD-10-CM

## 2019-07-20 DIAGNOSIS — I959 Hypotension, unspecified: Secondary | ICD-10-CM | POA: Diagnosis not present

## 2019-07-20 DIAGNOSIS — N183 Chronic kidney disease, stage 3 (moderate): Secondary | ICD-10-CM | POA: Diagnosis present

## 2019-07-20 DIAGNOSIS — Z9221 Personal history of antineoplastic chemotherapy: Secondary | ICD-10-CM | POA: Diagnosis not present

## 2019-07-20 DIAGNOSIS — E1122 Type 2 diabetes mellitus with diabetic chronic kidney disease: Secondary | ICD-10-CM | POA: Diagnosis present

## 2019-07-20 DIAGNOSIS — T8149XA Infection following a procedure, other surgical site, initial encounter: Secondary | ICD-10-CM | POA: Diagnosis not present

## 2019-07-20 DIAGNOSIS — Z4659 Encounter for fitting and adjustment of other gastrointestinal appliance and device: Secondary | ICD-10-CM

## 2019-07-20 DIAGNOSIS — F17210 Nicotine dependence, cigarettes, uncomplicated: Secondary | ICD-10-CM | POA: Diagnosis present

## 2019-07-20 DIAGNOSIS — E861 Hypovolemia: Secondary | ICD-10-CM | POA: Diagnosis not present

## 2019-07-20 DIAGNOSIS — Z79899 Other long term (current) drug therapy: Secondary | ICD-10-CM | POA: Diagnosis not present

## 2019-07-20 DIAGNOSIS — F329 Major depressive disorder, single episode, unspecified: Secondary | ICD-10-CM | POA: Diagnosis present

## 2019-07-20 DIAGNOSIS — Z20828 Contact with and (suspected) exposure to other viral communicable diseases: Secondary | ICD-10-CM | POA: Diagnosis present

## 2019-07-20 DIAGNOSIS — E875 Hyperkalemia: Secondary | ICD-10-CM | POA: Diagnosis present

## 2019-07-20 DIAGNOSIS — R0603 Acute respiratory distress: Secondary | ICD-10-CM

## 2019-07-20 DIAGNOSIS — C259 Malignant neoplasm of pancreas, unspecified: Secondary | ICD-10-CM | POA: Diagnosis present

## 2019-07-20 DIAGNOSIS — R509 Fever, unspecified: Secondary | ICD-10-CM

## 2019-07-20 HISTORY — PX: LAPAROSCOPY: SHX197

## 2019-07-20 HISTORY — PX: WHIPPLE PROCEDURE: SHX2667

## 2019-07-20 HISTORY — PX: PEG PLACEMENT: SHX5437

## 2019-07-20 LAB — COMPREHENSIVE METABOLIC PANEL
ALT: 55 U/L — ABNORMAL HIGH (ref 0–44)
ALT: 53 U/L — ABNORMAL HIGH (ref 0–44)
AST: 60 U/L — ABNORMAL HIGH (ref 15–41)
AST: 59 U/L — ABNORMAL HIGH (ref 15–41)
Albumin: 3.1 g/dL — ABNORMAL LOW (ref 3.5–5.0)
Albumin: 2.6 g/dL — ABNORMAL LOW (ref 3.5–5.0)
Alkaline Phosphatase: 94 U/L (ref 38–126)
Alkaline Phosphatase: 73 U/L (ref 38–126)
Anion gap: 12 (ref 5–15)
Anion gap: 9 (ref 5–15)
BUN: 19 mg/dL (ref 6–20)
BUN: 20 mg/dL (ref 6–20)
CO2: 20 mmol/L — ABNORMAL LOW (ref 22–32)
CO2: 21 mmol/L — ABNORMAL LOW (ref 22–32)
Calcium: 8 mg/dL — ABNORMAL LOW (ref 8.9–10.3)
Calcium: 7.9 mg/dL — ABNORMAL LOW (ref 8.9–10.3)
Chloride: 101 mmol/L (ref 98–111)
Chloride: 100 mmol/L (ref 98–111)
Creatinine, Ser: 1.52 mg/dL — ABNORMAL HIGH (ref 0.61–1.24)
Creatinine, Ser: 1.29 mg/dL — ABNORMAL HIGH (ref 0.61–1.24)
GFR calc Af Amer: 58 mL/min — ABNORMAL LOW (ref >60)
GFR calc Af Amer: >60 mL/min (ref >60)
GFR calc non Af Amer: 50 mL/min — ABNORMAL LOW (ref >60)
GFR calc non Af Amer: >60 mL/min (ref >60)
Glucose, Bld: 320 mg/dL — ABNORMAL HIGH (ref 70–99)
Glucose, Bld: 277 mg/dL — ABNORMAL HIGH (ref 70–99)
Potassium: 5.9 mmol/L — ABNORMAL HIGH (ref 3.5–5.1)
Potassium: 5 mmol/L (ref 3.5–5.1)
Sodium: 133 mmol/L — ABNORMAL LOW (ref 135–145)
Sodium: 130 mmol/L — ABNORMAL LOW (ref 135–145)
Total Bilirubin: 0.6 mg/dL (ref 0.3–1.2)
Total Bilirubin: 0.5 mg/dL (ref 0.3–1.2)
Total Protein: 6 g/dL — ABNORMAL LOW (ref 6.5–8.1)
Total Protein: 5.1 g/dL — ABNORMAL LOW (ref 6.5–8.1)

## 2019-07-20 LAB — CBC
HCT: 35.5 % — ABNORMAL LOW (ref 39.0–52.0)
Hemoglobin: 12.3 g/dL — ABNORMAL LOW (ref 13.0–17.0)
MCH: 32 pg (ref 26.0–34.0)
MCHC: 34.6 g/dL (ref 30.0–36.0)
MCV: 92.4 fL (ref 80.0–100.0)
Platelets: 186 10*3/uL (ref 150–400)
RBC: 3.84 MIL/uL — ABNORMAL LOW (ref 4.22–5.81)
RDW: 14.2 % (ref 11.5–15.5)
WBC: 6.6 10*3/uL (ref 4.0–10.5)
nRBC: 0 % (ref 0.0–0.2)

## 2019-07-20 LAB — BASIC METABOLIC PANEL
Anion gap: 10 (ref 5–15)
BUN: 21 mg/dL — ABNORMAL HIGH (ref 6–20)
CO2: 23 mmol/L (ref 22–32)
Calcium: 8 mg/dL — ABNORMAL LOW (ref 8.9–10.3)
Chloride: 100 mmol/L (ref 98–111)
Creatinine, Ser: 1.47 mg/dL — ABNORMAL HIGH (ref 0.61–1.24)
GFR calc Af Amer: >60 mL/min (ref >60)
GFR calc non Af Amer: 52 mL/min — ABNORMAL LOW (ref >60)
Glucose, Bld: 308 mg/dL — ABNORMAL HIGH (ref 70–99)
Potassium: 6.9 mmol/L (ref 3.5–5.1)
Sodium: 133 mmol/L — ABNORMAL LOW (ref 135–145)

## 2019-07-20 LAB — GLUCOSE, CAPILLARY
Glucose-Capillary: 165 mg/dL — ABNORMAL HIGH (ref 70–99)
Glucose-Capillary: 222 mg/dL — ABNORMAL HIGH (ref 70–99)
Glucose-Capillary: 263 mg/dL — ABNORMAL HIGH (ref 70–99)
Glucose-Capillary: 271 mg/dL — ABNORMAL HIGH (ref 70–99)
Glucose-Capillary: 275 mg/dL — ABNORMAL HIGH (ref 70–99)

## 2019-07-20 LAB — PROTIME-INR
INR: 1.1 (ref 0.8–1.2)
Prothrombin Time: 13.8 seconds (ref 11.4–15.2)

## 2019-07-20 LAB — MRSA PCR SCREENING: MRSA by PCR: NEGATIVE

## 2019-07-20 LAB — PREPARE RBC (CROSSMATCH)

## 2019-07-20 SURGERY — LAPAROSCOPY, DIAGNOSTIC
Anesthesia: Regional | Site: Abdomen

## 2019-07-20 MED ORDER — LIDOCAINE 2% (20 MG/ML) 5 ML SYRINGE
INTRAMUSCULAR | Status: AC
  Filled 2019-07-20: qty 5

## 2019-07-20 MED ORDER — CALCIUM GLUCONATE-NACL 1-0.675 GM/50ML-% IV SOLN
1.0000 g | Freq: Once | INTRAVENOUS | Status: AC
  Administered 2019-07-20: 1000 mg via INTRAVENOUS
  Filled 2019-07-20: qty 50

## 2019-07-20 MED ORDER — ONDANSETRON HCL 4 MG/2ML IJ SOLN
INTRAMUSCULAR | Status: DC | PRN
  Administered 2019-07-20: 4 mg via INTRAVENOUS

## 2019-07-20 MED ORDER — MIDAZOLAM HCL 2 MG/2ML IJ SOLN
INTRAMUSCULAR | Status: AC
  Filled 2019-07-20: qty 2

## 2019-07-20 MED ORDER — LIDOCAINE HCL (PF) 1 % IJ SOLN
INTRAMUSCULAR | Status: AC
  Filled 2019-07-20: qty 30

## 2019-07-20 MED ORDER — METHOCARBAMOL 1000 MG/10ML IJ SOLN
500.0000 mg | Freq: Four times a day (QID) | INTRAVENOUS | Status: DC | PRN
  Administered 2019-07-21: 500 mg via INTRAVENOUS
  Filled 2019-07-20 (×3): qty 5

## 2019-07-20 MED ORDER — DIPHENHYDRAMINE HCL 12.5 MG/5ML PO ELIX: 12.5000 mg | ORAL_SOLUTION | Freq: Four times a day (QID) | ORAL | Status: DC | PRN

## 2019-07-20 MED ORDER — INSULIN GLARGINE 100 UNIT/ML ~~LOC~~ SOLN
8.0000 [IU] | Freq: Every day | SUBCUTANEOUS | Status: DC
  Administered 2019-07-20: 8 [IU] via SUBCUTANEOUS
  Filled 2019-07-20 (×2): qty 0.08

## 2019-07-20 MED ORDER — VISTASEAL 10 ML SINGLE DOSE KIT
10.0000 mL | PACK | Freq: Once | CUTANEOUS | Status: AC
  Administered 2019-07-20: 10 mL via TOPICAL
  Filled 2019-07-20 (×2): qty 10

## 2019-07-20 MED ORDER — DEXTROSE 50 % IV SOLN
INTRAVENOUS | Status: AC
  Filled 2019-07-20: qty 50

## 2019-07-20 MED ORDER — CEFAZOLIN SODIUM-DEXTROSE 2-4 GM/100ML-% IV SOLN
2.0000 g | INTRAVENOUS | Status: AC
  Administered 2019-07-20 (×2): 2 g via INTRAVENOUS

## 2019-07-20 MED ORDER — ROCURONIUM BROMIDE 10 MG/ML (PF) SYRINGE
PREFILLED_SYRINGE | INTRAVENOUS | Status: AC
  Filled 2019-07-20: qty 10

## 2019-07-20 MED ORDER — CHLORHEXIDINE GLUCONATE CLOTH 2 % EX PADS: 6.0000 | MEDICATED_PAD | Freq: Once | CUTANEOUS | Status: DC

## 2019-07-20 MED ORDER — ACETAMINOPHEN 325 MG PO TABS: 325.0000 mg | ORAL_TABLET | ORAL | Status: DC | PRN

## 2019-07-20 MED ORDER — CEFAZOLIN SODIUM 1 G IJ SOLR
INTRAMUSCULAR | Status: AC
  Filled 2019-07-20: qty 20

## 2019-07-20 MED ORDER — SODIUM CHLORIDE 0.9% FLUSH: 9.0000 mL | INTRAVENOUS | Status: DC | PRN

## 2019-07-20 MED ORDER — ROCURONIUM BROMIDE 10 MG/ML (PF) SYRINGE
PREFILLED_SYRINGE | INTRAVENOUS | Status: AC
  Filled 2019-07-20: qty 20

## 2019-07-20 MED ORDER — MIDAZOLAM HCL 2 MG/2ML IJ SOLN
INTRAMUSCULAR | Status: DC | PRN
  Administered 2019-07-20: 2 mg via INTRAVENOUS

## 2019-07-20 MED ORDER — OXYCODONE HCL 5 MG PO TABS: 5.0000 mg | ORAL_TABLET | Freq: Once | ORAL | Status: DC | PRN

## 2019-07-20 MED ORDER — PANTOPRAZOLE SODIUM 40 MG IV SOLR
40.0000 mg | Freq: Every day | INTRAVENOUS | Status: DC
  Administered 2019-07-20 – 2019-07-27 (×8): 40 mg via INTRAVENOUS
  Filled 2019-07-20 (×8): qty 40

## 2019-07-20 MED ORDER — PROPOFOL 10 MG/ML IV BOLUS
INTRAVENOUS | Status: DC | PRN
  Administered 2019-07-20: 200 mg via INTRAVENOUS

## 2019-07-20 MED ORDER — DEXTROSE 50 % IV SOLN
INTRAVENOUS | Status: DC | PRN
  Administered 2019-07-20: 12.5 g via INTRAVENOUS

## 2019-07-20 MED ORDER — LACTATED RINGERS IV SOLN
INTRAVENOUS | Status: DC | PRN
  Administered 2019-07-20 (×2): via INTRAVENOUS

## 2019-07-20 MED ORDER — INSULIN ASPART 100 UNIT/ML ~~LOC~~ SOLN
8.0000 [IU] | SUBCUTANEOUS | Status: AC
  Administered 2019-07-20: 13:00:00 8 [IU] via INTRAVENOUS
  Filled 2019-07-20: qty 0.08

## 2019-07-20 MED ORDER — HYDROMORPHONE 1 MG/ML IV SOLN
INTRAVENOUS | Status: DC
  Administered 2019-07-20: 30 mg via INTRAVENOUS
  Administered 2019-07-20: 0.9 mg via INTRAVENOUS
  Administered 2019-07-21: 1.8 mg via INTRAVENOUS
  Administered 2019-07-21: 0.3 mg via INTRAVENOUS
  Administered 2019-07-21: 0.6 mg via INTRAVENOUS
  Administered 2019-07-21: 1.2 mg via INTRAVENOUS
  Administered 2019-07-21: 1.8 mg via INTRAVENOUS
  Administered 2019-07-21: 0.6 mg via INTRAVENOUS
  Administered 2019-07-22: 1.5 mg via INTRAVENOUS
  Administered 2019-07-22 (×2): 2.4 mg via INTRAVENOUS
  Administered 2019-07-22: 0 mg via INTRAVENOUS
  Administered 2019-07-22: 2.4 mg via INTRAVENOUS
  Administered 2019-07-22: 1.5 mg via INTRAVENOUS
  Administered 2019-07-22: 30 mg via INTRAVENOUS
  Administered 2019-07-23: 4.2 mg via INTRAVENOUS
  Administered 2019-07-23: 1.2 mg via INTRAVENOUS
  Administered 2019-07-23: 1.5 mg via INTRAVENOUS
  Administered 2019-07-24: 30 mg via INTRAVENOUS
  Administered 2019-07-24 (×2): 1.8 mg via INTRAVENOUS
  Administered 2019-07-25: 1.2 mg via INTRAVENOUS
  Administered 2019-07-25: 30 mg via INTRAVENOUS
  Administered 2019-07-25: 1.5 mg via INTRAVENOUS
  Administered 2019-07-25: 30 mg via INTRAVENOUS
  Administered 2019-07-25 (×2): 2.7 mg via INTRAVENOUS
  Administered 2019-07-26: 1.2 mg via INTRAVENOUS
  Administered 2019-07-26: 2.7 mg via INTRAVENOUS
  Filled 2019-07-20 (×4): qty 30

## 2019-07-20 MED ORDER — LACTATED RINGERS IV SOLN
INTRAVENOUS | Status: DC | PRN
  Administered 2019-07-20 (×2): via INTRAVENOUS

## 2019-07-20 MED ORDER — ONDANSETRON HCL 4 MG/2ML IJ SOLN: 4.0000 mg | Freq: Four times a day (QID) | INTRAMUSCULAR | Status: DC | PRN

## 2019-07-20 MED ORDER — LIDOCAINE 2% (20 MG/ML) 5 ML SYRINGE
INTRAMUSCULAR | Status: DC | PRN
  Administered 2019-07-20: 100 mg via INTRAVENOUS

## 2019-07-20 MED ORDER — DIPHENHYDRAMINE HCL 50 MG/ML IJ SOLN: 12.5000 mg | Freq: Four times a day (QID) | INTRAMUSCULAR | Status: DC | PRN

## 2019-07-20 MED ORDER — FENTANYL CITRATE (PF) 100 MCG/2ML IJ SOLN: 25.0000 ug | INTRAMUSCULAR | Status: DC | PRN

## 2019-07-20 MED ORDER — ONDANSETRON HCL 4 MG/2ML IJ SOLN
INTRAMUSCULAR | Status: AC
  Filled 2019-07-20: qty 2

## 2019-07-20 MED ORDER — ROPIVACAINE HCL 2 MG/ML IJ SOLN
INTRAMUSCULAR | Status: DC | PRN
  Administered 2019-07-20: 2 mL/h via EPIDURAL

## 2019-07-20 MED ORDER — STERILE WATER FOR IRRIGATION IR SOLN
Status: DC | PRN
  Administered 2019-07-20: 1000 mL

## 2019-07-20 MED ORDER — NALOXONE HCL 0.4 MG/ML IJ SOLN: 0.4000 mg | INTRAMUSCULAR | Status: DC | PRN

## 2019-07-20 MED ORDER — SODIUM CHLORIDE 0.9 % IV BOLUS
500.0000 mL | Freq: Once | INTRAVENOUS | Status: AC
  Administered 2019-07-20: 23:00:00 500 mL via INTRAVENOUS

## 2019-07-20 MED ORDER — FENTANYL CITRATE (PF) 250 MCG/5ML IJ SOLN
INTRAMUSCULAR | Status: AC
  Filled 2019-07-20: qty 5

## 2019-07-20 MED ORDER — ONDANSETRON 4 MG PO TBDP: 4.0000 mg | ORAL_TABLET | Freq: Four times a day (QID) | ORAL | Status: DC | PRN

## 2019-07-20 MED ORDER — ROCURONIUM BROMIDE 10 MG/ML (PF) SYRINGE
PREFILLED_SYRINGE | INTRAVENOUS | Status: DC | PRN
  Administered 2019-07-20: 20 mg via INTRAVENOUS
  Administered 2019-07-20: 30 mg via INTRAVENOUS
  Administered 2019-07-20 (×2): 20 mg via INTRAVENOUS
  Administered 2019-07-20 (×3): 10 mg via INTRAVENOUS
  Administered 2019-07-20: 20 mg via INTRAVENOUS
  Administered 2019-07-20: 70 mg via INTRAVENOUS

## 2019-07-20 MED ORDER — ACETAMINOPHEN 500 MG PO TABS
ORAL_TABLET | ORAL | Status: AC
  Administered 2019-07-20: 1000 mg via ORAL
  Filled 2019-07-20: qty 2

## 2019-07-20 MED ORDER — ACETAMINOPHEN 10 MG/ML IV SOLN
1000.0000 mg | Freq: Four times a day (QID) | INTRAVENOUS | Status: AC
  Administered 2019-07-20 – 2019-07-21 (×4): 1000 mg via INTRAVENOUS
  Filled 2019-07-20 (×4): qty 100

## 2019-07-20 MED ORDER — SODIUM CHLORIDE 0.9 % IV SOLN
INTRAVENOUS | Status: DC | PRN
  Administered 2019-07-20: 20 ug/min via INTRAVENOUS

## 2019-07-20 MED ORDER — ONDANSETRON HCL 4 MG/2ML IJ SOLN
4.0000 mg | Freq: Four times a day (QID) | INTRAMUSCULAR | Status: DC | PRN
  Administered 2019-07-20: 18:00:00 4 mg via INTRAVENOUS
  Filled 2019-07-20: qty 2

## 2019-07-20 MED ORDER — ALBUMIN HUMAN 25 % IV SOLN
25.0000 g | Freq: Four times a day (QID) | INTRAVENOUS | Status: AC
  Administered 2019-07-20 – 2019-07-22 (×8): 25 g via INTRAVENOUS
  Filled 2019-07-20: qty 50
  Filled 2019-07-20: qty 100
  Filled 2019-07-20: qty 50
  Filled 2019-07-20: qty 100
  Filled 2019-07-20: qty 50
  Filled 2019-07-20 (×3): qty 100
  Filled 2019-07-20: qty 150

## 2019-07-20 MED ORDER — OXYCODONE HCL 5 MG/5ML PO SOLN: 5.0000 mg | Freq: Once | ORAL | Status: DC | PRN

## 2019-07-20 MED ORDER — MEPERIDINE HCL 25 MG/ML IJ SOLN: 6.2500 mg | INTRAMUSCULAR | Status: DC | PRN

## 2019-07-20 MED ORDER — DEXAMETHASONE SODIUM PHOSPHATE 10 MG/ML IJ SOLN
INTRAMUSCULAR | Status: AC
  Filled 2019-07-20: qty 1

## 2019-07-20 MED ORDER — ACETAMINOPHEN 160 MG/5ML PO SOLN: 325.0000 mg | ORAL | Status: DC | PRN

## 2019-07-20 MED ORDER — DIPHENHYDRAMINE HCL 12.5 MG/5ML PO ELIX
12.5000 mg | ORAL_SOLUTION | Freq: Four times a day (QID) | ORAL | Status: DC | PRN
  Administered 2019-07-27 – 2019-07-29 (×3): 12.5 mg via ORAL
  Filled 2019-07-20 (×3): qty 5

## 2019-07-20 MED ORDER — PHENYLEPHRINE 40 MCG/ML (10ML) SYRINGE FOR IV PUSH (FOR BLOOD PRESSURE SUPPORT)
PREFILLED_SYRINGE | INTRAVENOUS | Status: DC | PRN
  Administered 2019-07-20: 80 ug via INTRAVENOUS

## 2019-07-20 MED ORDER — INSULIN ASPART 100 UNIT/ML ~~LOC~~ SOLN
0.0000 [IU] | SUBCUTANEOUS | Status: DC
  Administered 2019-07-20 (×2): 8 [IU] via SUBCUTANEOUS
  Administered 2019-07-21: 5 [IU] via SUBCUTANEOUS
  Administered 2019-07-21: 3 [IU] via SUBCUTANEOUS
  Administered 2019-07-21: 2 [IU] via SUBCUTANEOUS
  Administered 2019-07-21: 16:00:00 3 [IU] via SUBCUTANEOUS
  Administered 2019-07-21: 5 [IU] via SUBCUTANEOUS
  Administered 2019-07-22: 8 [IU] via SUBCUTANEOUS
  Administered 2019-07-22 (×2): 2 [IU] via SUBCUTANEOUS
  Administered 2019-07-22: 3 [IU] via SUBCUTANEOUS
  Administered 2019-07-22: 5 [IU] via SUBCUTANEOUS
  Administered 2019-07-22: 11 [IU] via SUBCUTANEOUS
  Administered 2019-07-22: 4 [IU] via SUBCUTANEOUS
  Administered 2019-07-23: 5 [IU] via SUBCUTANEOUS
  Administered 2019-07-23: 3 [IU] via SUBCUTANEOUS
  Administered 2019-07-23: 03:00:00 2 [IU] via SUBCUTANEOUS
  Administered 2019-07-24: 5 [IU] via SUBCUTANEOUS
  Administered 2019-07-24: 08:00:00 3 [IU] via SUBCUTANEOUS
  Administered 2019-07-24 (×2): 5 [IU] via SUBCUTANEOUS
  Administered 2019-07-24 (×2): 3 [IU] via SUBCUTANEOUS
  Administered 2019-07-25 (×2): 5 [IU] via SUBCUTANEOUS
  Administered 2019-07-25 (×2): 3 [IU] via SUBCUTANEOUS
  Administered 2019-07-25: 11 [IU] via SUBCUTANEOUS
  Administered 2019-07-25: 3 [IU] via SUBCUTANEOUS
  Administered 2019-07-26 (×3): 5 [IU] via SUBCUTANEOUS
  Administered 2019-07-26: 3 [IU] via SUBCUTANEOUS
  Administered 2019-07-26 – 2019-07-27 (×3): 5 [IU] via SUBCUTANEOUS
  Administered 2019-07-27: 3 [IU] via SUBCUTANEOUS
  Administered 2019-07-27: 5 [IU] via SUBCUTANEOUS
  Administered 2019-07-27 (×2): 2 [IU] via SUBCUTANEOUS
  Administered 2019-07-28: 09:00:00 5 [IU] via SUBCUTANEOUS
  Administered 2019-07-28 (×2): 3 [IU] via SUBCUTANEOUS
  Administered 2019-07-28 (×3): 8 [IU] via SUBCUTANEOUS
  Administered 2019-07-29: 15 [IU] via SUBCUTANEOUS
  Administered 2019-07-29: 8 [IU] via SUBCUTANEOUS
  Administered 2019-07-29: 5 [IU] via SUBCUTANEOUS
  Administered 2019-07-29 (×2): 8 [IU] via SUBCUTANEOUS

## 2019-07-20 MED ORDER — CEFAZOLIN SODIUM-DEXTROSE 2-4 GM/100ML-% IV SOLN
INTRAVENOUS | Status: AC
  Filled 2019-07-20: qty 100

## 2019-07-20 MED ORDER — 0.9 % SODIUM CHLORIDE (POUR BTL) OPTIME
TOPICAL | Status: DC | PRN
  Administered 2019-07-20 (×2): 1000 mL

## 2019-07-20 MED ORDER — SUGAMMADEX SODIUM 200 MG/2ML IV SOLN
INTRAVENOUS | Status: DC | PRN
  Administered 2019-07-20: 200 mg via INTRAVENOUS

## 2019-07-20 MED ORDER — ONDANSETRON HCL 4 MG/2ML IJ SOLN: 4.0000 mg | Freq: Once | INTRAMUSCULAR | Status: DC | PRN

## 2019-07-20 MED ORDER — WATER FOR IRRIGATION, STERILE IR SOLN
Status: DC | PRN
  Administered 2019-07-20: 1000 mL via SURGICAL_CAVITY

## 2019-07-20 MED ORDER — PROPOFOL 10 MG/ML IV BOLUS
INTRAVENOUS | Status: AC
  Filled 2019-07-20: qty 20

## 2019-07-20 MED ORDER — BUPIVACAINE-EPINEPHRINE (PF) 0.25% -1:200000 IJ SOLN
INTRAMUSCULAR | Status: AC
  Filled 2019-07-20: qty 30

## 2019-07-20 MED ORDER — SODIUM CHLORIDE 0.9% IV SOLUTION: Freq: Once | INTRAVENOUS | Status: DC

## 2019-07-20 MED ORDER — CEFAZOLIN SODIUM-DEXTROSE 2-4 GM/100ML-% IV SOLN
2.0000 g | Freq: Three times a day (TID) | INTRAVENOUS | Status: AC
  Administered 2019-07-20: 21:00:00 2 g via INTRAVENOUS
  Filled 2019-07-20: qty 100

## 2019-07-20 MED ORDER — FENTANYL CITRATE (PF) 250 MCG/5ML IJ SOLN
INTRAMUSCULAR | Status: DC | PRN
  Administered 2019-07-20 (×11): 50 ug via INTRAVENOUS
  Administered 2019-07-20: 100 ug via INTRAVENOUS

## 2019-07-20 MED ORDER — ALBUMIN HUMAN 5 % IV SOLN
INTRAVENOUS | Status: DC | PRN
  Administered 2019-07-20: 12:00:00 via INTRAVENOUS

## 2019-07-20 MED ORDER — DEXTROSE 50 % IV SOLN
1.0000 | Freq: Once | INTRAVENOUS | Status: AC
  Administered 2019-07-20: 18:00:00 50 mL via INTRAVENOUS

## 2019-07-20 MED ORDER — INSULIN ASPART 100 UNIT/ML IV SOLN
10.0000 [IU] | Freq: Once | INTRAVENOUS | Status: AC
  Administered 2019-07-20: 18:00:00 10 [IU] via INTRAVENOUS

## 2019-07-20 MED ORDER — SODIUM CHLORIDE 0.9 % IV SOLN
INTRAVENOUS | Status: DC | PRN
  Administered 2019-07-20: 17:00:00 500 mL via INTRAVENOUS

## 2019-07-20 MED ORDER — CHLORHEXIDINE GLUCONATE CLOTH 2 % EX PADS
6.0000 | MEDICATED_PAD | Freq: Every day | CUTANEOUS | Status: DC
  Administered 2019-07-20 – 2019-07-29 (×5): 6 via TOPICAL

## 2019-07-20 MED ORDER — ACETAMINOPHEN 500 MG PO TABS
1000.0000 mg | ORAL_TABLET | ORAL | Status: AC
  Administered 2019-07-20: 07:00:00 1000 mg via ORAL

## 2019-07-20 MED ORDER — DEXTROSE-NACL 5-0.45 % IV SOLN
INTRAVENOUS | Status: DC
  Administered 2019-07-20 – 2019-07-21 (×4): via INTRAVENOUS
  Administered 2019-07-22: 950 mL via INTRAVENOUS
  Administered 2019-07-25: 03:00:00 via INTRAVENOUS

## 2019-07-20 MED ORDER — LIDOCAINE HCL 1 % IJ SOLN
INTRAMUSCULAR | Status: DC | PRN
  Administered 2019-07-20: 6 mL

## 2019-07-20 MED ORDER — ENOXAPARIN SODIUM 40 MG/0.4ML ~~LOC~~ SOLN: 40.0000 mg | SUBCUTANEOUS | Status: DC

## 2019-07-20 MED ORDER — HYDRALAZINE HCL 20 MG/ML IJ SOLN: 20.0000 mg | INTRAMUSCULAR | Status: DC | PRN

## 2019-07-20 MED ORDER — DEXTROSE 50 % IV SOLN
INTRAVENOUS | Status: AC
  Administered 2019-07-20: 50 mL via INTRAVENOUS
  Filled 2019-07-20: qty 50

## 2019-07-20 MED ORDER — SODIUM CHLORIDE (PF) 0.9 % IJ SOLN
INTRAMUSCULAR | Status: AC
  Filled 2019-07-20: qty 10

## 2019-07-20 MED ORDER — DEXAMETHASONE SODIUM PHOSPHATE 10 MG/ML IJ SOLN
INTRAMUSCULAR | Status: DC | PRN
  Administered 2019-07-20: 4 mg via INTRAVENOUS

## 2019-07-20 MED ORDER — PHENYLEPHRINE HCL (PRESSORS) 10 MG/ML IV SOLN
INTRAVENOUS | Status: AC
  Filled 2019-07-20: qty 1

## 2019-07-20 MED ORDER — ROPIVACAINE HCL 2 MG/ML IJ SOLN
10.0000 mL/h | INTRAMUSCULAR | Status: DC
  Administered 2019-07-21 – 2019-07-25 (×6): 10 mL/h via EPIDURAL
  Filled 2019-07-20 (×10): qty 200

## 2019-07-20 MED ORDER — SODIUM CHLORIDE 0.9 % IV SOLN: 2.0000 mL/h | Status: DC

## 2019-07-20 SURGICAL SUPPLY — 113 items
ADH SKN CLS APL DERMABOND .7 (GAUZE/BANDAGES/DRESSINGS) ×1
APL PRP STRL LF DISP 70% ISPRP (MISCELLANEOUS) ×1
BAG BILE T-TUBES STRL (MISCELLANEOUS) ×6 IMPLANT
BAG DRN 9.5 2 ADJ BELT ADPR (MISCELLANEOUS) ×2
BIOPATCH RED 1 DISK 7.0 (GAUZE/BANDAGES/DRESSINGS) ×2 IMPLANT
BIOPATCH RED 1IN DISK 7.0MM (GAUZE/BANDAGES/DRESSINGS) ×2
BLADE CLIPPER SURG (BLADE) ×2 IMPLANT
BLADE SURG 10 STRL SS (BLADE) ×3 IMPLANT
BOOT SUTURE AID YELLOW STND (SUTURE) ×5 IMPLANT
CANISTER SUCT 3000ML PPV (MISCELLANEOUS) ×3 IMPLANT
CHLORAPREP W/TINT 26 (MISCELLANEOUS) ×3 IMPLANT
CLIP VESOCCLUDE LG 6/CT (CLIP) ×5 IMPLANT
CLIP VESOCCLUDE MED 24/CT (CLIP) ×3 IMPLANT
CLIP VESOLOCK LG 6/CT PURPLE (CLIP) ×15 IMPLANT
CLIP VESOLOCK MED 6/CT (CLIP) ×3 IMPLANT
CLIP VESOLOCK MED LG 6/CT (CLIP) ×3 IMPLANT
COVER MAYO STAND STRL (DRAPES) ×3 IMPLANT
COVER SURGICAL LIGHT HANDLE (MISCELLANEOUS) ×3 IMPLANT
DECANTER SPIKE VIAL GLASS SM (MISCELLANEOUS) ×6 IMPLANT
DERMABOND ADVANCED (GAUZE/BANDAGES/DRESSINGS) ×2
DERMABOND ADVANCED .7 DNX12 (GAUZE/BANDAGES/DRESSINGS) ×1 IMPLANT
DRAIN CHANNEL 19F RND (DRAIN) ×6 IMPLANT
DRAIN PENROSE 1/2X12 LTX STRL (WOUND CARE) ×2 IMPLANT
DRAPE LAPAROSCOPIC ABDOMINAL (DRAPES) ×3 IMPLANT
DRAPE WARM FLUID 44X44 (DRAPES) ×3 IMPLANT
DRSG OPSITE POSTOP 4X10 (GAUZE/BANDAGES/DRESSINGS) ×2 IMPLANT
DRSG TEGADERM 2-3/8X2-3/4 SM (GAUZE/BANDAGES/DRESSINGS) ×2 IMPLANT
DRSG TEGADERM 4X4.75 (GAUZE/BANDAGES/DRESSINGS) ×4 IMPLANT
DRSG TELFA 3X8 NADH (GAUZE/BANDAGES/DRESSINGS) ×3 IMPLANT
ELECT BLADE 4.0 EZ CLEAN MEGAD (MISCELLANEOUS) ×3
ELECT BLADE 6.5 EXT (BLADE) ×3 IMPLANT
ELECT CAUTERY BLADE 6.4 (BLADE) ×3 IMPLANT
ELECT REM PT RETURN 9FT ADLT (ELECTROSURGICAL) ×3
ELECTRODE BLDE 4.0 EZ CLN MEGD (MISCELLANEOUS) IMPLANT
ELECTRODE REM PT RTRN 9FT ADLT (ELECTROSURGICAL) ×1 IMPLANT
GAUZE SPONGE 2X2 8PLY STRL LF (GAUZE/BANDAGES/DRESSINGS) IMPLANT
GEL ULTRASOUND 20GR AQUASONIC (MISCELLANEOUS) ×3 IMPLANT
GLOVE BIO SURGEON STRL SZ 6 (GLOVE) ×8 IMPLANT
GLOVE BIO SURGEON STRL SZ7 (GLOVE) ×6 IMPLANT
GLOVE BIO SURGEON STRL SZ7.5 (GLOVE) ×4 IMPLANT
GLOVE BIOGEL PI IND STRL 7.0 (GLOVE) IMPLANT
GLOVE BIOGEL PI IND STRL 7.5 (GLOVE) IMPLANT
GLOVE BIOGEL PI INDICATOR 7.0 (GLOVE) ×6
GLOVE BIOGEL PI INDICATOR 7.5 (GLOVE) ×4
GLOVE ECLIPSE 7.5 STRL STRAW (GLOVE) ×8 IMPLANT
GLOVE INDICATOR 6.5 STRL GRN (GLOVE) ×8 IMPLANT
GOWN STRL REUS W/ TWL LRG LVL3 (GOWN DISPOSABLE) ×2 IMPLANT
GOWN STRL REUS W/ TWL XL LVL3 (GOWN DISPOSABLE) IMPLANT
GOWN STRL REUS W/TWL 2XL LVL3 (GOWN DISPOSABLE) ×6 IMPLANT
GOWN STRL REUS W/TWL LRG LVL3 (GOWN DISPOSABLE) ×12
GOWN STRL REUS W/TWL XL LVL3 (GOWN DISPOSABLE) ×3
HANDLE SUCTION POOLE (INSTRUMENTS) ×1 IMPLANT
KIT BASIN OR (CUSTOM PROCEDURE TRAY) ×3 IMPLANT
KIT MARKER MARGIN INK (KITS) ×3 IMPLANT
KIT TUBE JEJUNAL 16FR (CATHETERS) ×3 IMPLANT
KIT TURNOVER KIT B (KITS) ×3 IMPLANT
L-HOOK LAP DISP 36CM (ELECTROSURGICAL) ×3
LHOOK LAP DISP 36CM (ELECTROSURGICAL) ×1 IMPLANT
LOOP VESSEL MAXI BLUE (MISCELLANEOUS) ×3 IMPLANT
LOOP VESSEL MINI RED (MISCELLANEOUS) ×3 IMPLANT
NEEDLE 22X1 1/2 (OR ONLY) (NEEDLE) ×3 IMPLANT
NS IRRIG 1000ML POUR BTL (IV SOLUTION) ×6 IMPLANT
PACK GENERAL/GYN (CUSTOM PROCEDURE TRAY) ×3 IMPLANT
PAD ARMBOARD 7.5X6 YLW CONV (MISCELLANEOUS) ×6 IMPLANT
PAD DRESSING TELFA 3X8 NADH (GAUZE/BANDAGES/DRESSINGS) IMPLANT
PENCIL BUTTON HOLSTER BLD 10FT (ELECTRODE) ×3 IMPLANT
PENCIL SMOKE EVACUATOR (MISCELLANEOUS) ×3 IMPLANT
RELOAD PROXIMATE 75MM BLUE (ENDOMECHANICALS) ×9 IMPLANT
RELOAD STAPLE 75 3.8 BLU REG (ENDOMECHANICALS) IMPLANT
SET TUBE SMOKE EVAC HIGH FLOW (TUBING) ×3 IMPLANT
SHEARS FOC LG CVD HARMONIC 17C (MISCELLANEOUS) ×3 IMPLANT
SLEEVE ENDOPATH XCEL 5M (ENDOMECHANICALS) ×3 IMPLANT
SLEEVE SUCTION 125 (MISCELLANEOUS) ×3 IMPLANT
SLEEVE SUCTION CATH 165 (SLEEVE) ×3 IMPLANT
SPONGE GAUZE 2X2 STER 10/PKG (GAUZE/BANDAGES/DRESSINGS) ×2
SPONGE LAP 18X18 RF (DISPOSABLE) ×8 IMPLANT
SPONGE LAP 18X18 X RAY DECT (DISPOSABLE) ×4 IMPLANT
STAPLER PROXIMATE 75MM BLUE (STAPLE) ×3 IMPLANT
STAPLER VISISTAT 35W (STAPLE) ×3 IMPLANT
SUCTION POOLE HANDLE (INSTRUMENTS) ×3
SUT 5.0 PDS RB-1 (SUTURE) ×10
SUT ETHILON 2 0 FS 18 (SUTURE) ×10 IMPLANT
SUT MNCRL AB 4-0 PS2 18 (SUTURE) ×3 IMPLANT
SUT PDS AB 1 TP1 96 (SUTURE) ×7 IMPLANT
SUT PDS AB 3-0 SH 27 (SUTURE) ×12 IMPLANT
SUT PDS AB 4-0 RB1 27 (SUTURE) ×37 IMPLANT
SUT PDS PLUS AB 5-0 RB-1 (SUTURE) ×5 IMPLANT
SUT PROLENE 3 0 SH 48 (SUTURE) ×12 IMPLANT
SUT PROLENE 4 0 RB 1 (SUTURE) ×9
SUT PROLENE 4-0 RB1 .5 CRCL 36 (SUTURE) ×2 IMPLANT
SUT PROLENE 5 0 RB 1 DA (SUTURE) ×6 IMPLANT
SUT SILK 2 0 SH CR/8 (SUTURE) ×7 IMPLANT
SUT SILK 2 0 TIES 10X30 (SUTURE) ×3 IMPLANT
SUT SILK 3 0 SH CR/8 (SUTURE) ×3 IMPLANT
SUT SILK 3 0 TIES 10X30 (SUTURE) ×3 IMPLANT
SUT VIC AB 2-0 SH 18 (SUTURE) ×3 IMPLANT
SUT VIC AB 3-0 SH 18 (SUTURE) ×3 IMPLANT
SYR BULB IRRIGATION 50ML (SYRINGE) ×2 IMPLANT
SYR TOOMEY 50ML (SYRINGE) ×2 IMPLANT
SYRINGE TOOMEY DISP (SYRINGE) ×2 IMPLANT
TAPE UMBILICAL 1/8 X36 TWILL (MISCELLANEOUS) ×2 IMPLANT
TOWEL GREEN STERILE (TOWEL DISPOSABLE) ×3 IMPLANT
TOWEL GREEN STERILE FF (TOWEL DISPOSABLE) ×3 IMPLANT
TRAY FOLEY MTR SLVR 14FR STAT (SET/KITS/TRAYS/PACK) ×3 IMPLANT
TRAY LAPAROSCOPIC MC (CUSTOM PROCEDURE TRAY) ×3 IMPLANT
TROCAR XCEL BLUNT TIP 100MML (ENDOMECHANICALS) IMPLANT
TROCAR XCEL NON-BLD 11X100MML (ENDOMECHANICALS) IMPLANT
TROCAR XCEL NON-BLD 5MMX100MML (ENDOMECHANICALS) ×3 IMPLANT
TUBE CONNECTING 12'X1/4 (SUCTIONS) ×1
TUBE CONNECTING 12X1/4 (SUCTIONS) ×2 IMPLANT
TUBE FEEDING 8FR 16IN STR KANG (MISCELLANEOUS) IMPLANT
TUBE FEEDING ENTERAL 5FR 16IN (TUBING) ×2 IMPLANT
YANKAUER SUCT BULB TIP NO VENT (SUCTIONS) ×5 IMPLANT

## 2019-07-20 NOTE — Op Note (Signed)
PREOPERATIVE DIAGNOSIS: adenocarcinoma of the pancreatic head, cT2N0M0, s/p neoadjuvant chemotherapy  POSTOPERATIVE DIAGNOSIS: Same.   PROCEDURES PERFORMED:  Diagnostic laparoscopy  Classic pancreaticoduodenectomy   Placement of pancreatic duct stent  16 Fr jejunostomy feeding tube  SURGEON: [name], MD   ASSISTANT: Nedra Hai, MD and Judyann Munson, RNFA   ANESTHESIA: General and epidural   FINDINGS: 2 cm pancreatic head mass. Firm pancreatic tissue. 14 mm common bile duct. 4 mm pancreatic duct  SPECIMENS:  1. Pancreaticoduodenectomy with gallbladder:  2. Omentum  ESTIMATED BLOOD LOSS: 300 mL.   COMPLICATIONS: None known.   PROCEDURE:   Pt was identified in the holding area and taken to  the operating room, and placed supine on the operating room  table. General anesthesia was induced. The patient's abdomen was  prepped and draped in a sterile fashion, after a Foley catheter was  placed. A time-out was performed according to the surgical safety check  list. When all was correct we continued.   The patient was placed in reverse trendelenburg position and rotated to the right.  The left subcostal margin was anesthetized with local anesthesia.  A 5 mm optiview trocar was placed under direct visualization.  The abdomen was insufflated with carbon dioxide.  The abdomen was examined.  A second port was placed in the upper midline to be able to better visualize the right liver.  No evidence of carcinomatosis was seen.    A midline incision was made from the xiphoid to just above the umbilicus. The subcutaneous tissues were divided with the Bovie cautery. The peritoneum was entered in the center of the abdomen. Digital retraction was then used to elevate the preperitoneal fat, and  this was taken with the cautery as well. The subcutaneous tissues and  fascia of the muscular layers were taken laterally with the cautery.  Care was taken to protect the underlying viscera.    The Bookwalter self-retaining retractor was placed to assist for visualization. The right colon was taken down off of the white line  of Toldt and from the retroperitoneum at the hepatic flexure. The porta was identified. The  duodenum was kocherized extensively with blunt dissection and with cautery. The gallbladder was taken off the liver with a combination of blunt dissection and cautery. The cystic duct was clipped with  Hemalock clips. The cystic duct was divided and the gallbladder was passed off.   The common bile duct was skeletonized near the duodenum. A vessel loop was passed around it. The gastroduodenal artery, as well as the common hepatic artery were skeletonized. The proper hepatic artery was traced out to make sure that flow was going to both sides of the liver when the GDA was clamped. The GDA was test clamped with the bulldog, with good flow to the liver and  no signs of ischemia. This was divided with 2-0 silk ties and then clipped. The proper hepatic artery was reflected upward, and the anterior portal vein was exposed.  A Kelly clamp was passed underneath the pancreas at the superior mesenteric vein, and this passed  easily with no signs of tumor involvement.   Attention was then directed to the stomach, and the omentum was taken  off of the stomach at the border of the antrum and the body. The  gastrohepatic ligament was taken down with the harmonic, and care was  taken to make sure there was not a replaced left hepatic artery in this  location. The stomach was divided with the GIA-75 stapler.  The border  of the stomach was oversewn with a 3-0 running PDS suture.   Attention was then directed to the small bowel. Around 10 cm past the  ligament of Treitz was located, and the jejunum was divided with the 75-GIA.  The distal portion of the jejunum was also oversewn with a 3-0 PDS  suture. The fourth portion of the duodenum was skeletonized with the  harmonic scalpel,  taking down all of the mesenteric vessels. The  ligament of Treitz was taken down. The IMV was preserved.  The duodenum was then passed underneath the portal vein.   At this point the Claiborne Billings was replaced on top of the superior mesenteric vein and the pancreas was divided with the cautery. 2-0 silk sutures were tied down and the inferior and superior border of the pancreas. The Bovie was used to coagulate the small bleeders at the border of the pancreas.  The Overholt in combination with the harmonic and locking Weck clips  were then used to take the uncinate process off of the portal vein and  the superior mesenteric artery. The mass was a bit stuck initially to the vein, but it came off relatively easily with blunt dissection with a clamp.  Care was taken not to incorporate the  superior mesenteric artery in the dissection. The specimen was then marked and passed off the table for frozen section margin. The frozen section margins on the pancreatic and bile duct were negative.    The jejunum was then passed underneath the SMV in order to get appropriate lie for the pancreatic and biliary anastomoses. The appropriate location for the choledochojejunostomy was identified, and  the small bowel was opened approximately 12 mm. The anastamosis was created with approximately thirteen 4-0 interrupted PDS sutures.   The 2 corner sutures were placed first  and then the posterior layer was done in an interrupted fashion tying on  the inside. The superior layer was then closed with interrupted sutures as  well.   The pancreatic anastomosis was then created by opening the jejunum the length  of the pancreatic parenchyma. The pancreas was firm, and the duct was 4 mm. A pediatric feeding tube was used as a pancreatic stent. The posterior layer was formed first with 2-0 silk sutures in interrupted fashion. Five 5-0 PDS sutures were used to create a duct-to-mucosa anastamosis. The anterior layer was then oversewn  with 2-0 silks to dunk the pancreatic parenchyma.   The more distal portion of the jejunum was pulled up over  the colon, and two 3-0 silks were placed through the posterior border  of the stomach for the gastrojejunostomy. The stomach and the small  bowel were opened, and a GIA-75 was used to create an end-to-end  anastomosis. The open areas of the staple line were examined to ensure  that there was hemostasis. The defect was then closed with a single  layer of running Connell suture of 3-0 PDS. Prior to a complete  closure, the NG tube was passed toward the afferent limb.   The colon was located and elevated. The jejunum was located around 30 cm beyond the Gruver.   A 3-0 silk pursestring suture was placed in the small intestine on the antimesenteric border. An appropriate location for a J-tube is located in the left upper abdomen. A incision was made in the tonsil used to pull the J-tube through the abdominal wall. The jejunum was opened and the tube was threaded distally in the jejunum.  Witzel sutures  were placed proximally on the jejunum.  The tube was then pexed to the abdominal wall with 2-0 silk sutures.  The tube was flushed with saline.  The flange of the tube was secured with interrupted 2-0 nylons.    The abdomen was then irrigated and the CJ and PJ were treated with vistaseal.   This was allowed to dry.  The abdomen was then irrigated again with water and all the laparotomy sponges were removed. A lap count was performed, which was correct. Two 19-Blake drains were placed, with the  lateral-most drain placed behind the choledochojejunostomy. The medial  Blake drain was placed just anterior and slightly superior to the  pancreaticojejunostomy. The fascia was then closed with #1 looped running PDS sutures. The skin was irrigated and then closed with staples. The wounds were cleaned, dried and dressed with a sterile  dressing.   The patient tolerated the procedure well and was extubated  and taken to PACU in stable condition. Needle and sponge counts were correct x2.

## 2019-07-20 NOTE — Anesthesia Procedure Notes (Addendum)
Procedure Name: Intubation Date/Time: 07/20/2019 7:49 AM Performed by: Amadeo Garnet, CRNA Pre-anesthesia Checklist: Suction available, Patient identified, Emergency Drugs available, Patient being monitored and Timeout performed Patient Re-evaluated:Patient Re-evaluated prior to induction Oxygen Delivery Method: Circle system utilized Preoxygenation: Pre-oxygenation with 100% oxygen Induction Type: IV induction Ventilation: Mask ventilation without difficulty Laryngoscope Size: Mac and 3 Grade View: Grade I Tube type: Oral Tube size: 7.5 mm Number of attempts: 1 Airway Equipment and Method: Stylet Placement Confirmation: ETT inserted through vocal cords under direct vision,  positive ETCO2 and breath sounds checked- equal and bilateral Secured at: 23 cm Tube secured with: Tape Dental Injury: Teeth and Oropharynx as per pre-operative assessment

## 2019-07-20 NOTE — Anesthesia Procedure Notes (Addendum)
Epidural thoracic @ T7 Patient location during procedure: pre-op Start time: 07/20/2019 7:00 AM End time: 07/20/2019 7:10 AM  Staffing Anesthesiologist: Janeece Riggers, MD  Preanesthetic Checklist Completed: patient identified, site marked, surgical consent, pre-op evaluation, timeout performed, IV checked, risks and benefits discussed and monitors and equipment checked  Epidural Patient position: sitting Prep: site prepped and draped and DuraPrep Patient monitoring: continuous pulse ox and blood pressure Approach: midline Epidural location: T7. Injection technique: LOR air  Needle:  Needle type: Tuohy  Needle gauge: 17 G Needle length: 9 cm and 9 Needle insertion depth: 6 cm Catheter type: closed end flexible Catheter size: 19 Gauge Catheter at skin depth: 13 cm Test dose: negative  Assessment Events: blood not aspirated, injection not painful, no injection resistance, negative IV test and no paresthesia

## 2019-07-20 NOTE — Interval H&P Note (Signed)
History and Physical Interval Note:  07/20/2019 7:34 AM  Brian Torres  has presented today for surgery, with the diagnosis of PANCREATIC CANCER.  The various methods of treatment have been discussed with the patient and family. After consideration of risks, benefits and other options for treatment, the patient has consented to  Procedure(s): LAPAROSCOPY DIAGNOSTIC (N/A) WHIPPLE PROCEDURE (N/A) FEEDING TUBE (N/A) as a surgical intervention.  The patient's history has been reviewed, patient examined, no change in status, stable for surgery.  I have reviewed the patient's chart and labs.  Questions were answered to the patient's satisfaction.     [name]

## 2019-07-20 NOTE — Progress Notes (Signed)
CRITICAL VALUE ALERT  Critical Value:  Potassium 6.9 Date & Time Notied:  9/17 1650  Provider Notified: Barry Dienes MD  Orders Received/Actions taken: See [facility]

## 2019-07-20 NOTE — Anesthesia Procedure Notes (Signed)
Arterial Line Insertion Start/End9/17/2020 7:15 AM, 07/20/2019 7:22 AM Performed by: Verdie Drown, CRNA, CRNA  Patient location: Pre-op. Preanesthetic checklist: patient identified, IV checked, site marked, risks and benefits discussed, surgical consent, monitors and equipment checked, pre-op evaluation and timeout performed Lidocaine 1% used for infiltration and patient sedated Left, radial was placed Catheter size: 20 G Hand hygiene performed  and maximum sterile barriers used   Attempts: 1 Procedure performed without using ultrasound guided technique. Following insertion, dressing applied and Biopatch. Patient tolerated the procedure well with no immediate complications.

## 2019-07-20 NOTE — Transfer of Care (Signed)
Immediate Anesthesia Transfer of Care Note  Patient: Brian Torres  Procedure(s) Performed: LAPAROSCOPY DIAGNOSTIC (N/A Abdomen) WHIPPLE PROCEDURE (N/A Abdomen) FEEDING TUBE (N/A Abdomen)  Patient Location: PACU  Anesthesia Type:GA combined with regional for post-op pain  Level of Consciousness: awake, alert  and oriented  Airway & Oxygen Therapy: Patient Spontanous Breathing and Patient connected to nasal cannula oxygen  Post-op Assessment: Report given to RN, Post -op Vital signs reviewed and stable and Patient moving all extremities  Post vital signs: Reviewed and stable  Last Vitals:  Vitals Value Taken Time  BP 116/70 07/20/19 1431  Temp 36.9 C 07/20/19 1430  Pulse 85 07/20/19 1437  Resp 25 07/20/19 1437  SpO2 100 % 07/20/19 1437  Vitals shown include unvalidated device data.  Last Pain:  Vitals:   07/20/19 0643  TempSrc:   PainSc: 0-No pain         Complications: No apparent anesthesia complications

## 2019-07-20 NOTE — Anesthesia Procedure Notes (Deleted)
Performed by: Kayani Rapaport C, CRNA       

## 2019-07-21 ENCOUNTER — Encounter (HOSPITAL_COMMUNITY): Payer: Self-pay | Admitting: General Surgery

## 2019-07-21 ENCOUNTER — Inpatient Hospital Stay (HOSPITAL_COMMUNITY): Payer: Managed Care, Other (non HMO) | Admitting: Anesthesiology

## 2019-07-21 LAB — COMPREHENSIVE METABOLIC PANEL
ALT: 45 U/L — ABNORMAL HIGH (ref 0–44)
AST: 46 U/L — ABNORMAL HIGH (ref 15–41)
Albumin: 3.1 g/dL — ABNORMAL LOW (ref 3.5–5.0)
Alkaline Phosphatase: 69 U/L (ref 38–126)
Anion gap: 10 (ref 5–15)
BUN: 25 mg/dL — ABNORMAL HIGH (ref 6–20)
CO2: 21 mmol/L — ABNORMAL LOW (ref 22–32)
Calcium: 7.8 mg/dL — ABNORMAL LOW (ref 8.9–10.3)
Chloride: 102 mmol/L (ref 98–111)
Creatinine, Ser: 1.46 mg/dL — ABNORMAL HIGH (ref 0.61–1.24)
GFR calc Af Amer: >60 mL/min (ref >60)
GFR calc non Af Amer: 52 mL/min — ABNORMAL LOW (ref >60)
Glucose, Bld: 156 mg/dL — ABNORMAL HIGH (ref 70–99)
Potassium: 4.5 mmol/L (ref 3.5–5.1)
Sodium: 133 mmol/L — ABNORMAL LOW (ref 135–145)
Total Bilirubin: 0.4 mg/dL (ref 0.3–1.2)
Total Protein: 5.6 g/dL — ABNORMAL LOW (ref 6.5–8.1)

## 2019-07-21 LAB — CBC
HCT: 30.1 % — ABNORMAL LOW (ref 39.0–52.0)
Hemoglobin: 10 g/dL — ABNORMAL LOW (ref 13.0–17.0)
MCH: 31.3 pg (ref 26.0–34.0)
MCHC: 33.2 g/dL (ref 30.0–36.0)
MCV: 94.1 fL (ref 80.0–100.0)
Platelets: 117 10*3/uL — ABNORMAL LOW (ref 150–400)
RBC: 3.2 MIL/uL — ABNORMAL LOW (ref 4.22–5.81)
RDW: 14.4 % (ref 11.5–15.5)
WBC: 7.2 10*3/uL (ref 4.0–10.5)
nRBC: 0 % (ref 0.0–0.2)

## 2019-07-21 LAB — MAGNESIUM: Magnesium: 1.3 mg/dL — ABNORMAL LOW (ref 1.7–2.4)

## 2019-07-21 LAB — GLUCOSE, CAPILLARY
Glucose-Capillary: 115 mg/dL — ABNORMAL HIGH (ref 70–99)
Glucose-Capillary: 133 mg/dL — ABNORMAL HIGH (ref 70–99)
Glucose-Capillary: 170 mg/dL — ABNORMAL HIGH (ref 70–99)
Glucose-Capillary: 170 mg/dL — ABNORMAL HIGH (ref 70–99)
Glucose-Capillary: 190 mg/dL — ABNORMAL HIGH (ref 70–99)
Glucose-Capillary: 220 mg/dL — ABNORMAL HIGH (ref 70–99)

## 2019-07-21 LAB — PHOSPHORUS: Phosphorus: 4.1 mg/dL (ref 2.5–4.6)

## 2019-07-21 MED ORDER — OSMOLITE 1.5 CAL PO LIQD
1000.0000 mL | ORAL | Status: DC
  Administered 2019-07-22 – 2019-07-24 (×3): 1000 mL
  Filled 2019-07-21 (×3): qty 1000

## 2019-07-21 MED ORDER — INSULIN GLARGINE 100 UNIT/ML ~~LOC~~ SOLN
13.0000 [IU] | Freq: Every day | SUBCUTANEOUS | Status: DC
  Administered 2019-07-21 – 2019-07-25 (×5): 13 [IU] via SUBCUTANEOUS
  Filled 2019-07-21 (×7): qty 0.13

## 2019-07-21 MED ORDER — MAGNESIUM SULFATE 2 GM/50ML IV SOLN
2.0000 g | Freq: Once | INTRAVENOUS | Status: AC
  Administered 2019-07-21: 2 g via INTRAVENOUS
  Filled 2019-07-21: qty 50

## 2019-07-21 NOTE — Progress Notes (Signed)
Received call from lab that they are crediting am labs and will redraw due to hemoglobin reading 4.5. RN will continue to monitor.

## 2019-07-21 NOTE — Progress Notes (Signed)
1 Day Post-Op   Subjective/Chief Complaint: Sore, but pain is tolerable.  Had brief hypotension.  Resolved.  He had high potassium intraoperatively and post op yesterday.  Required D50 and insulin, better this AM.     Objective: Vital signs in last 24 hours: Temp:  [98.5 F (36.9 C)-99.9 F (37.7 C)] 98.7 F (37.1 C) (09/18 0400) Pulse Rate:  [71-89] 88 (09/18 0500) Resp:  [14-148] 19 (09/18 0500) BP: (75-126)/(48-70) 112/56 (09/18 0500) SpO2:  [96 %-100 %] 100 % (09/18 0500) Arterial Line BP: (71-107)/(56-102) 71/63 (09/17 1700) Last BM Date: (pta)  Intake/Output from previous day: 09/17 0701 - 09/18 0700 In: 5504.5 [I.V.:4402.5; IV Piggyback:943.7] Out: 2015 [Urine:1065; Emesis/NG output:50; Drains:250; Blood:650] Intake/Output this shift: No intake/output data recorded.  General appearance: alert, cooperative and no distress Resp: breathign comfortably Cardio: regular rate and rhythm GI: soft, non distended.  dressing c/d/i.  drains serosang.  J tube in place Extremities: extremities normal, atraumatic, no cyanosis or edema  Lab Results:  Recent Labs    07/20/19 1610  WBC 6.6  HGB 12.3*  HCT 35.5*  PLT 186   BMET Recent Labs    07/20/19 1610 07/20/19 2008  NA 133* 130*  K 6.9* 5.0  CL 100 100  CO2 23 21*  GLUCOSE 308* 277*  BUN 21* 20  CREATININE 1.47* 1.29*  CALCIUM 8.0* 7.9*   PT/INR Recent Labs    07/20/19 1610  LABPROT 13.8  INR 1.1   ABG No results for input(s): PHART, HCO3 in the last 72 hours.  Invalid input(s): PCO2, PO2  Studies/Results: X-ray Abdomen Ap  Result Date: 07/20/2019 CLINICAL DATA:  58 year old male with NG tube placement. EXAM: ABDOMEN - 1 VIEW COMPARISON:  CT of the abdomen dated 05/11/2019. FINDINGS: An enteric tube is noted extending below the diaphragm. The tube makes a single loop in the proximal stomach with tip located in the left hemiabdomen, likely in the distal stomach. There has been interval removal of the  biliary stent. Two drainage tube is noted with tips in the epigastric area. No bowel dilatation. Partially visualized central venous line with tip at the cavoatrial junction. Diffuse vascular and interstitial prominence may represent edema although pneumonia is not excluded. Clinical correlation is recommended. IMPRESSION: Enteric tube with tip likely in the distal stomach. Electronically Signed   By: Anner Crete M.D.   On: 07/20/2019 15:00    Anti-infectives: Anti-infectives (From admission, onward)   Start     Dose/Rate Route Frequency Ordered Stop   07/20/19 2000  ceFAZolin (ANCEF) IVPB 2g/100 mL premix     2 g 200 mL/hr over 30 Minutes Intravenous Every 8 hours 07/20/19 1523 07/20/19 2122   07/20/19 0630  ceFAZolin (ANCEF) IVPB 2g/100 mL premix     2 g 200 mL/hr over 30 Minutes Intravenous On call to O.R. 07/20/19 0622 07/20/19 1203   07/20/19 0629  ceFAZolin (ANCEF) 2-4 GM/100ML-% IVPB    Note to Pharmacy: Nyoka Cowden   : cabinet override      07/20/19 0629 07/20/19 0758      Assessment/Plan: s/p Procedure(s): LAPAROSCOPY DIAGNOSTIC (N/A) WHIPPLE PROCEDURE (N/A) FEEDING TUBE (N/A) do not remove foley due to urinary output monitoring.  Epidural, pca for pain control along with standing IV tylenol and prn robaxin lantus and SSI for DM.  Increase lantus today. Hyperkalemia - resolved.  Keep an eye on this. CKD stage 3- minimize hypotension or hypovolemia Adenocarcinoma of pancreatic head - await pathology Hypomagnesemia - replete.   LOS:  1 day    [name] 07/21/2019

## 2019-07-21 NOTE — Progress Notes (Signed)
Inpatient Diabetes Program Recommendations  AACE/ADA: New Consensus Statement on Inpatient Glycemic Control (2015)  Target Ranges:  Prepandial:   less than 140 mg/dL      Peak postprandial:   less than 180 mg/dL (1-2 hours)      Critically ill patients:  140 - 180 mg/dL   Lab Results  Component Value Date   GLUCAP 133 (H) 07/21/2019   HGBA1C 8.6 (H) 07/12/2019    Review of Glycemic Control Results for KYRYN, BONNICI" (MRN [id number]) as of 07/21/2019 08:52  Ref. Range 07/20/2019 23:33 07/21/2019 03:36 07/21/2019 08:53  Glucose-Capillary Latest Ref Range: 70 - 99 mg/dL 222 (H) 115 (H) 133 (H)   Diabetes history: Type 2 DM Outpatient Diabetes medications: Amaryl 4 mg QAM, Humalog 3-13 units BID, Basaglar 19 units QHS, Metformin 1000 mg BID, Actos 45 mg QD Current orders for Inpatient glycemic control: Novolog 0-15 units Q4H, Lantus 13 units QHS Decadron 4 mg x 1 on 9/17  Inpatient Diabetes Program Recommendations:    Noted hyperglycemia yesterday following Decadron administration. FSBG was 156 mg/dL. Would recommend decreasing correction to Novolog 0-9 units Q4H. Will follow for start of tube feeds.   Thanks, Bronson Curb, MSN, RNC-OB Diabetes Coordinator [phone] (8a-5p)

## 2019-07-21 NOTE — Progress Notes (Signed)
Initial Nutrition Assessment  DOCUMENTATION CODES:   Not applicable  INTERVENTION:   Initiate Osmolite 1.5 @ 20 ml/hr via j-tube on 9/19  Recommend advancing TF to goal rate of 65 ml/hr Recommend 30 ml Prostat BID  TF regimen will provide: 2540 kcal, 127 grams protein, and 1191 ml free water.   Discussed diet and TF progression after surgery   NUTRITION DIAGNOSIS:   Increased nutrient needs related to cancer and cancer related treatments as evidenced by estimated needs.  GOAL:   Patient will meet greater than or equal to 90% of their needs  MONITOR:   Diet advancement, TF tolerance  REASON FOR ASSESSMENT:   Malnutrition Screening Tool, Consult Enteral/tube feeding initiation and management  ASSESSMENT:   Pt with PMH of dyslipidemia, smoker DM, and HTN who was dx with pancreatic cancer 12/2018 s/p ERCP, EUS, and 8 cycles of FOLFIRINOX pt now admitted for surgery. Pt s/p whipple 9/17.   Pt and his wife provide hx. They report that his usual weight was 220 lb prior to cancer dx. He lost down to 182 lb but has been able to regain some of his weight and was 205 lb PTA. 7% weight loss x 7 months. Pt sleepy during visit.  Pt reports decreased appetite and nausea during cancer treatments causing weight loss.   Medications reviewed and include: SSI, lantus  D5 1/2 NS @ 100 ml/hr  Labs reviewed: Na 133 (L), magnesium 1.3 (L) CBG's: 222-115-133   Drains: 250 ml x 24 hrs 16 F NG tube for suction J-tube clamped   NUTRITION - FOCUSED PHYSICAL EXAM:    Most Recent Value  Orbital Region  No depletion  Upper Arm Region  No depletion  Thoracic and Lumbar Region  No depletion  Buccal Region  No depletion  Temple Region  No depletion  Clavicle Bone Region  Mild depletion  Clavicle and Acromion Bone Region  No depletion  Scapular Bone Region  No depletion  Dorsal Hand  No depletion  Patellar Region  No depletion  Anterior Thigh Region  No depletion  Posterior Calf Region   No depletion  Edema (RD Assessment)  None  Hair  Reviewed  Eyes  Reviewed  Mouth  Reviewed  Skin  Reviewed  Nails  Reviewed       Diet Order:   Diet Order            Diet NPO time specified  Diet effective now              EDUCATION NEEDS:   Education needs have been addressed  Skin:  Skin Assessment: Reviewed RN Assessment  Last BM:  unknown  Height:   Ht Readings from Last 1 Encounters:  07/20/19 6\' 4"  (1.93 m)    Weight:   Wt Readings from Last 1 Encounters:  07/20/19 91.6 kg    Ideal Body Weight:  91.8 kg  BMI:  Body mass index is 24.59 kg/m.  Estimated Nutritional Needs:   Kcal:  [id number]  Protein:  120-145 grams  Fluid:  >2L/day  Maylon Peppers RD, LDN, CNSC [phone] Pager [phone] After Hours Pager

## 2019-07-21 NOTE — Anesthesia Post-op Follow-up Note (Signed)
  Anesthesia Pain Follow-up Note  Patient: Brian Torres  Day #: 1  Date of Follow-up: 07/21/2019 Time: 5:03 PM  Last Vitals:  Vitals:   07/21/19 1216 07/21/19 1558  BP:    Pulse:    Resp: 12 20  Temp:    SpO2: 99% 99%    Level of Consciousness: alert  Pain: 5 /10   Side Effects:None  Catheter Site Exam:clean, dry, no drainage  Epidural / Intrathecal (From admission, onward)   Start     Dose/Rate Route Frequency Ordered Stop   07/20/19 1430  ropivacaine (PF) 2 mg/mL (0.2%) (NAROPIN) injection     10 mL/hr 10 mL/hr  Epidural Continuous 07/20/19 1427         Plan: Continue current therapy of postop epidural at surgeon's request  Nolon Nations

## 2019-07-22 ENCOUNTER — Inpatient Hospital Stay (HOSPITAL_COMMUNITY): Payer: Managed Care, Other (non HMO)

## 2019-07-22 LAB — CBC
HCT: 25.9 % — ABNORMAL LOW (ref 39.0–52.0)
HCT: 26.4 % — ABNORMAL LOW (ref 39.0–52.0)
Hemoglobin: 8.8 g/dL — ABNORMAL LOW (ref 13.0–17.0)
Hemoglobin: 8.9 g/dL — ABNORMAL LOW (ref 13.0–17.0)
MCH: 31.9 pg (ref 26.0–34.0)
MCH: 31.6 pg (ref 26.0–34.0)
MCHC: 34 g/dL (ref 30.0–36.0)
MCHC: 33.7 g/dL (ref 30.0–36.0)
MCV: 93.8 fL (ref 80.0–100.0)
MCV: 93.6 fL (ref 80.0–100.0)
Platelets: 117 10*3/uL — ABNORMAL LOW (ref 150–400)
Platelets: 112 10*3/uL — ABNORMAL LOW (ref 150–400)
RBC: 2.76 MIL/uL — ABNORMAL LOW (ref 4.22–5.81)
RBC: 2.82 MIL/uL — ABNORMAL LOW (ref 4.22–5.81)
RDW: 14.4 % (ref 11.5–15.5)
RDW: 14.1 % (ref 11.5–15.5)
WBC: 7.2 10*3/uL (ref 4.0–10.5)
WBC: 5.4 10*3/uL (ref 4.0–10.5)
nRBC: 0 % (ref 0.0–0.2)
nRBC: 0 % (ref 0.0–0.2)

## 2019-07-22 LAB — COMPREHENSIVE METABOLIC PANEL
ALT: 40 U/L (ref 0–44)
AST: 33 U/L (ref 15–41)
Albumin: 3.5 g/dL (ref 3.5–5.0)
Alkaline Phosphatase: 71 U/L (ref 38–126)
Anion gap: 8 (ref 5–15)
BUN: 13 mg/dL (ref 6–20)
CO2: 24 mmol/L (ref 22–32)
Calcium: 8.6 mg/dL — ABNORMAL LOW (ref 8.9–10.3)
Chloride: 102 mmol/L (ref 98–111)
Creatinine, Ser: 0.95 mg/dL (ref 0.61–1.24)
GFR calc Af Amer: >60 mL/min (ref >60)
GFR calc non Af Amer: >60 mL/min (ref >60)
Glucose, Bld: 131 mg/dL — ABNORMAL HIGH (ref 70–99)
Potassium: 4 mmol/L (ref 3.5–5.1)
Sodium: 134 mmol/L — ABNORMAL LOW (ref 135–145)
Total Bilirubin: 0.6 mg/dL (ref 0.3–1.2)
Total Protein: 5.8 g/dL — ABNORMAL LOW (ref 6.5–8.1)

## 2019-07-22 LAB — GLUCOSE, CAPILLARY
Glucose-Capillary: 153 mg/dL — ABNORMAL HIGH (ref 70–99)
Glucose-Capillary: 130 mg/dL — ABNORMAL HIGH (ref 70–99)
Glucose-Capillary: 148 mg/dL — ABNORMAL HIGH (ref 70–99)
Glucose-Capillary: 219 mg/dL — ABNORMAL HIGH (ref 70–99)
Glucose-Capillary: 306 mg/dL — ABNORMAL HIGH (ref 70–99)
Glucose-Capillary: 281 mg/dL — ABNORMAL HIGH (ref 70–99)

## 2019-07-22 LAB — POCT I-STAT 4, (NA,K, GLUC, HGB,HCT)
Glucose, Bld: 271 mg/dL — ABNORMAL HIGH (ref 70–99)
Glucose, Bld: 311 mg/dL — ABNORMAL HIGH (ref 70–99)
HCT: 34 % — ABNORMAL LOW (ref 39.0–52.0)
HCT: 34 % — ABNORMAL LOW (ref 39.0–52.0)
Hemoglobin: 11.6 g/dL — ABNORMAL LOW (ref 13.0–17.0)
Hemoglobin: 11.6 g/dL — ABNORMAL LOW (ref 13.0–17.0)
Potassium: 5.7 mmol/L — ABNORMAL HIGH (ref 3.5–5.1)
Potassium: 6.6 mmol/L (ref 3.5–5.1)
Sodium: 132 mmol/L — ABNORMAL LOW (ref 135–145)
Sodium: 133 mmol/L — ABNORMAL LOW (ref 135–145)

## 2019-07-22 LAB — MAGNESIUM: Magnesium: 2.1 mg/dL (ref 1.7–2.4)

## 2019-07-22 LAB — POCT I-STAT 7, (LYTES, BLD GAS, ICA,H+H)
Acid-base deficit: 3 mmol/L — ABNORMAL HIGH (ref 0.0–2.0)
Bicarbonate: 22 mmol/L (ref 20.0–28.0)
Calcium, Ion: 1.11 mmol/L — ABNORMAL LOW (ref 1.15–1.40)
HCT: 34 % — ABNORMAL LOW (ref 39.0–52.0)
Hemoglobin: 11.6 g/dL — ABNORMAL LOW (ref 13.0–17.0)
O2 Saturation: 97 %
Patient temperature: 36.8
Potassium: 6.7 mmol/L (ref 3.5–5.1)
Sodium: 133 mmol/L — ABNORMAL LOW (ref 135–145)
TCO2: 23 mmol/L (ref 22–32)
pCO2 arterial: 39.4 mmHg (ref 32.0–48.0)
pH, Arterial: 7.353 (ref 7.350–7.450)
pO2, Arterial: 91 mmHg (ref 83.0–108.0)

## 2019-07-22 MED ORDER — LABETALOL HCL 5 MG/ML IV SOLN
20.0000 mg | Freq: Once | INTRAVENOUS | Status: AC
  Administered 2019-07-22: 16:00:00 20 mg via INTRAVENOUS

## 2019-07-22 MED ORDER — LABETALOL HCL 5 MG/ML IV SOLN: 20.0000 mg | INTRAVENOUS | Status: DC | PRN

## 2019-07-22 MED ORDER — FUROSEMIDE 10 MG/ML IJ SOLN
20.0000 mg | Freq: Once | INTRAMUSCULAR | Status: AC
  Administered 2019-07-22: 18:00:00 20 mg via INTRAVENOUS
  Filled 2019-07-22: qty 2

## 2019-07-22 MED ORDER — LABETALOL HCL 5 MG/ML IV SOLN
INTRAVENOUS | Status: AC
  Filled 2019-07-22: qty 4

## 2019-07-22 MED ORDER — ACETAMINOPHEN 10 MG/ML IV SOLN
1000.0000 mg | Freq: Four times a day (QID) | INTRAVENOUS | Status: AC
  Administered 2019-07-22 – 2019-07-23 (×4): 1000 mg via INTRAVENOUS
  Filled 2019-07-22 (×4): qty 100

## 2019-07-22 NOTE — Progress Notes (Signed)
2 Days Post-Op   Subjective/Chief Complaint: Pt complains of congestion and general abdominal soreness and tightness. He says he has been feeling congested for a couple days. Pt denies SOB and chest pain. Pt has been able to void, but has not passed flatus or had a BM. Pt has not ambulated since his surgery, and is NPO.    Objective: Vital signs in last 24 hours: Temp:  [98.2 F (36.8 C)-100.6 F (38.1 C)] 98.4 F (36.9 C) (09/19 0400) Pulse Rate:  [84-109] 109 (09/19 0800) Resp:  [12-23] 15 (09/19 0800) BP: (102-150)/(62-82) 150/82 (09/19 0800) SpO2:  [94 %-100 %] 99 % (09/19 0800) Arterial Line BP: (34-156)/(30-153) 34/30 (09/19 0600) Last BM Date: (pta)  Intake/Output from previous day: 09/18 0701 - 09/19 0700 In: 1644.5 [I.V.:1306.4; IV Piggyback:168.2] Out: 2470 [Urine:2350; Drains:120] Intake/Output this shift: Total I/O In: 867.3 [I.V.:557.4; Other:131.8; IV Piggyback:178.1] Out: -   General: Alert, cooperative, and in no acute distress.  HEENT: Neck supple, no lymphadenopathy.  CV: Regular rate and rhythm Pulm: Clear to auscultation in all lung fields.  Abdomen: TTP, normoactive bowel sounds.  Extremities: No edema, cap refill under 2 seconds, and no pain to palpation  Lab Results:  Recent Labs    07/21/19 0753 07/22/19 0552  WBC 7.2 7.2  HGB 10.0* 8.8*  HCT 30.1* 25.9*  PLT 117* 117*   BMET Recent Labs    07/21/19 0753 07/22/19 0552  NA 133* 134*  K 4.5 4.0  CL 102 102  CO2 21* 24  GLUCOSE 156* 131*  BUN 25* 13  CREATININE 1.46* 0.95  CALCIUM 7.8* 8.6*   PT/INR Recent Labs    07/20/19 1610  LABPROT 13.8  INR 1.1   ABG Recent Labs    07/20/19 1221  PHART 7.353  HCO3 22.0    Studies/Results: X-ray Abdomen Ap  Result Date: 07/20/2019 CLINICAL DATA:  58 year old male with NG tube placement. EXAM: ABDOMEN - 1 VIEW COMPARISON:  CT of the abdomen dated 05/11/2019. FINDINGS: An enteric tube is noted extending below the diaphragm. The tube  makes a single loop in the proximal stomach with tip located in the left hemiabdomen, likely in the distal stomach. There has been interval removal of the biliary stent. Two drainage tube is noted with tips in the epigastric area. No bowel dilatation. Partially visualized central venous line with tip at the cavoatrial junction. Diffuse vascular and interstitial prominence may represent edema although pneumonia is not excluded. Clinical correlation is recommended. IMPRESSION: Enteric tube with tip likely in the distal stomach. Electronically Signed   By: Anner Crete M.D.   On: 07/20/2019 15:00    Anti-infectives: Anti-infectives (From admission, onward)   Start     Dose/Rate Route Frequency Ordered Stop   07/20/19 2000  ceFAZolin (ANCEF) IVPB 2g/100 mL premix     2 g 200 mL/hr over 30 Minutes Intravenous Every 8 hours 07/20/19 1523 07/20/19 2122   07/20/19 0630  ceFAZolin (ANCEF) IVPB 2g/100 mL premix     2 g 200 mL/hr over 30 Minutes Intravenous On call to O.R. 07/20/19 0622 07/20/19 1203   07/20/19 0629  ceFAZolin (ANCEF) 2-4 GM/100ML-% IVPB    Note to Pharmacy: Nyoka Cowden   : cabinet override      07/20/19 0629 07/20/19 0758      Assessment/Plan: POD2 Whipple procedure for pancreatic head carcinoma ID: Cefalozin  FEV: feeding tube in place, continue to monitor glucose levels  Follow up: TBD  LOS: 2 days  Renato Gails Bassheva Flury 07/22/2019

## 2019-07-23 LAB — COMPREHENSIVE METABOLIC PANEL
ALT: 25 U/L (ref 0–44)
AST: 16 U/L (ref 15–41)
Albumin: 2.5 g/dL — ABNORMAL LOW (ref 3.5–5.0)
Alkaline Phosphatase: 69 U/L (ref 38–126)
Anion gap: 5 (ref 5–15)
BUN: 12 mg/dL (ref 6–20)
CO2: 20 mmol/L — ABNORMAL LOW (ref 22–32)
Calcium: 6.6 mg/dL — ABNORMAL LOW (ref 8.9–10.3)
Chloride: 112 mmol/L — ABNORMAL HIGH (ref 98–111)
Creatinine, Ser: 0.7 mg/dL (ref 0.61–1.24)
GFR calc Af Amer: >60 mL/min (ref >60)
GFR calc non Af Amer: >60 mL/min (ref >60)
Glucose, Bld: 78 mg/dL (ref 70–99)
Potassium: 3.3 mmol/L — ABNORMAL LOW (ref 3.5–5.1)
Sodium: 137 mmol/L (ref 135–145)
Total Bilirubin: 0.7 mg/dL (ref 0.3–1.2)
Total Protein: 4.7 g/dL — ABNORMAL LOW (ref 6.5–8.1)

## 2019-07-23 LAB — GLUCOSE, CAPILLARY
Glucose-Capillary: 145 mg/dL — ABNORMAL HIGH (ref 70–99)
Glucose-Capillary: 71 mg/dL (ref 70–99)
Glucose-Capillary: 115 mg/dL — ABNORMAL HIGH (ref 70–99)
Glucose-Capillary: 189 mg/dL — ABNORMAL HIGH (ref 70–99)
Glucose-Capillary: 212 mg/dL — ABNORMAL HIGH (ref 70–99)

## 2019-07-23 LAB — CBC
HCT: 25.8 % — ABNORMAL LOW (ref 39.0–52.0)
Hemoglobin: 8.7 g/dL — ABNORMAL LOW (ref 13.0–17.0)
MCH: 32 pg (ref 26.0–34.0)
MCHC: 33.7 g/dL (ref 30.0–36.0)
MCV: 94.9 fL (ref 80.0–100.0)
Platelets: 124 10*3/uL — ABNORMAL LOW (ref 150–400)
RBC: 2.72 MIL/uL — ABNORMAL LOW (ref 4.22–5.81)
RDW: 14.3 % (ref 11.5–15.5)
WBC: 7 10*3/uL (ref 4.0–10.5)
nRBC: 0 % (ref 0.0–0.2)

## 2019-07-23 MED ORDER — ACETAMINOPHEN 160 MG/5ML PO SOLN
650.0000 mg | Freq: Once | ORAL | Status: AC
  Administered 2019-07-24: 06:00:00 650 mg
  Filled 2019-07-23: qty 20.3

## 2019-07-23 MED ORDER — POTASSIUM CHLORIDE 10 MEQ/100ML IV SOLN
10.0000 meq | INTRAVENOUS | Status: AC
  Administered 2019-07-23 (×4): 10 meq via INTRAVENOUS
  Filled 2019-07-23 (×4): qty 100

## 2019-07-23 NOTE — Anesthesia Post-op Follow-up Note (Signed)
  Anesthesia Pain Follow-up Note  Patient: Brian Torres  Day #: 2  Date of Follow-up: 07/23/2019 Time: 12:03 AM  Last Vitals:  Vitals:   07/22/19 1800 07/22/19 2000  BP: (!) 122/58 (!) 110/52  Pulse: (!) 107 93  Resp: (!) 24 17  Temp:  37.2 C  SpO2: 96% 97%    Level of Consciousness: resting comfortably  Pain: none   Side Effects:None  Catheter Site Exam: clean, dry  Epidural / Intrathecal (From admission, onward)   Start     Dose/Rate Route Frequency Ordered Stop   07/20/19 1430  ropivacaine (PF) 2 mg/mL (0.2%) (NAROPIN) injection     10 mL/hr 10 mL/hr  Epidural Continuous 07/20/19 1427         Plan: Continue current therapy of postop epidural at surgeon's request  Vianney Kopecky,E. Cherae Marton

## 2019-07-23 NOTE — Progress Notes (Signed)
3 Days Post-Op   Subjective/Chief Complaint: Doing well today NGT out yesterday and TFs started No BMs BS better recently   Objective: Vital signs in last 24 hours: Temp:  [98.3 F (36.8 C)-100.7 F (38.2 C)] 98.4 F (36.9 C) (09/20 0800) Pulse Rate:  [81-146] 82 (09/20 0900) Resp:  [12-28] 12 (09/20 0900) BP: (110-174)/(52-86) 131/64 (09/20 0900) SpO2:  [95 %-100 %] 99 % (09/20 0900) Last BM Date: (PTA)  Intake/Output from previous day: 09/19 0701 - 09/20 0700 In: 1840.9 [I.V.:1117.3; IV Piggyback:493.7] Out: [id number]; Drains:80] Intake/Output this shift: Total I/O In: 1610.4 [I.V.:1260.6; Other:149.9; IV Piggyback:200] Out: -   Constitutional: No acute distress, conversant, appears states age. Eyes: Anicteric sclerae, moist conjunctiva, no lid lag Lungs: Clear to auscultation bilaterally, normal respiratory effort CV: regular rate and rhythm, no murmurs, no peripheral edema, pedal pulses 2+ GI: Soft, no masses or hepatosplenomegaly, approp ttp, J tube in place, midline inc c/d/i, JP SS Skin: No rashes, palpation reveals normal turgor Psychiatric: appropriate judgment and insight, oriented to person, place, and time   Lab Results:  Recent Labs    07/22/19 1730 07/23/19 0600  WBC 5.4 7.0  HGB 8.9* 8.7*  HCT 26.4* 25.8*  PLT 112* 124*   BMET Recent Labs    07/22/19 0552 07/23/19 0600  NA 134* 137  K 4.0 3.3*  CL 102 112*  CO2 24 20*  GLUCOSE 131* 78  BUN 13 12  CREATININE 0.95 0.70  CALCIUM 8.6* 6.6*   PT/INR Recent Labs    07/20/19 1610  LABPROT 13.8  INR 1.1   ABG Recent Labs    07/20/19 1221  PHART 7.353  HCO3 22.0    Studies/Results: Dg Chest Port 1 View  Result Date: 07/22/2019 CLINICAL DATA:  Respiratory difficulty, fever EXAM: PORTABLE CHEST 1 VIEW COMPARISON:  02/02/2019 FINDINGS: The heart size and mediastinal contours are within normal limits. Left chest port catheter. Both lungs are clear. The visualized skeletal  structures are unremarkable. IMPRESSION: No acute abnormality of the lungs in AP portable projection. Electronically Signed   By: Eddie Candle M.D.   On: 07/22/2019 16:30    Anti-infectives: Anti-infectives (From admission, onward)   Start     Dose/Rate Route Frequency Ordered Stop   07/20/19 2000  ceFAZolin (ANCEF) IVPB 2g/100 mL premix     2 g 200 mL/hr over 30 Minutes Intravenous Every 8 hours 07/20/19 1523 07/20/19 2122   07/20/19 0630  ceFAZolin (ANCEF) IVPB 2g/100 mL premix     2 g 200 mL/hr over 30 Minutes Intravenous On call to O.R. 07/20/19 0622 07/20/19 1203   07/20/19 0629  ceFAZolin (ANCEF) 2-4 GM/100ML-% IVPB    Note to Pharmacy: Nyoka Cowden   : cabinet override      07/20/19 0629 07/20/19 0758      Assessment/Plan: s/p Procedure(s): LAPAROSCOPY DIAGNOSTIC (N/A) WHIPPLE PROCEDURE (N/A) FEEDING TUBE (N/A) con't foley while epidural in Mobilze to chair Con't TFs Pulm toilet   LOS: 3 days    Ralene Ok 07/23/2019

## 2019-07-24 ENCOUNTER — Inpatient Hospital Stay (HOSPITAL_COMMUNITY): Payer: Managed Care, Other (non HMO)

## 2019-07-24 LAB — GLUCOSE, CAPILLARY
Glucose-Capillary: 174 mg/dL — ABNORMAL HIGH (ref 70–99)
Glucose-Capillary: 174 mg/dL — ABNORMAL HIGH (ref 70–99)
Glucose-Capillary: 187 mg/dL — ABNORMAL HIGH (ref 70–99)
Glucose-Capillary: 194 mg/dL — ABNORMAL HIGH (ref 70–99)
Glucose-Capillary: 209 mg/dL — ABNORMAL HIGH (ref 70–99)
Glucose-Capillary: 236 mg/dL — ABNORMAL HIGH (ref 70–99)
Glucose-Capillary: 247 mg/dL — ABNORMAL HIGH (ref 70–99)

## 2019-07-24 LAB — COMPREHENSIVE METABOLIC PANEL
ALT: 22 U/L (ref 0–44)
AST: 15 U/L (ref 15–41)
Albumin: 2.6 g/dL — ABNORMAL LOW (ref 3.5–5.0)
Alkaline Phosphatase: 109 U/L (ref 38–126)
Anion gap: 11 (ref 5–15)
BUN: 15 mg/dL (ref 6–20)
CO2: 21 mmol/L — ABNORMAL LOW (ref 22–32)
Calcium: 7.6 mg/dL — ABNORMAL LOW (ref 8.9–10.3)
Chloride: 101 mmol/L (ref 98–111)
Creatinine, Ser: 1.15 mg/dL (ref 0.61–1.24)
GFR calc Af Amer: >60 mL/min (ref >60)
GFR calc non Af Amer: >60 mL/min (ref >60)
Glucose, Bld: 211 mg/dL — ABNORMAL HIGH (ref 70–99)
Potassium: 4.3 mmol/L (ref 3.5–5.1)
Sodium: 133 mmol/L — ABNORMAL LOW (ref 135–145)
Total Bilirubin: 0.6 mg/dL (ref 0.3–1.2)
Total Protein: 5.3 g/dL — ABNORMAL LOW (ref 6.5–8.1)

## 2019-07-24 LAB — CBC
HCT: 24.6 % — ABNORMAL LOW (ref 39.0–52.0)
Hemoglobin: 7.9 g/dL — ABNORMAL LOW (ref 13.0–17.0)
MCH: 31 pg (ref 26.0–34.0)
MCHC: 32.1 g/dL (ref 30.0–36.0)
MCV: 96.5 fL (ref 80.0–100.0)
Platelets: 141 10*3/uL — ABNORMAL LOW (ref 150–400)
RBC: 2.55 MIL/uL — ABNORMAL LOW (ref 4.22–5.81)
RDW: 14.6 % (ref 11.5–15.5)
WBC: 7.9 10*3/uL (ref 4.0–10.5)
nRBC: 0 % (ref 0.0–0.2)

## 2019-07-24 LAB — URINALYSIS, COMPLETE (UACMP) WITH MICROSCOPIC
Bilirubin Urine: NEGATIVE
Glucose, UA: 150 mg/dL — AB
Ketones, ur: NEGATIVE mg/dL
Leukocytes,Ua: NEGATIVE
Nitrite: NEGATIVE
Protein, ur: 100 mg/dL — AB
Specific Gravity, Urine: 1.023 (ref 1.005–1.030)
pH: 5 (ref 5.0–8.0)

## 2019-07-24 MED ORDER — VANCOMYCIN HCL 10 G IV SOLR
1750.0000 mg | Freq: Once | INTRAVENOUS | Status: AC
  Administered 2019-07-24: 1750 mg via INTRAVENOUS
  Filled 2019-07-24: qty 1750

## 2019-07-24 MED ORDER — VANCOMYCIN HCL 10 G IV SOLR
1250.0000 mg | Freq: Two times a day (BID) | INTRAVENOUS | Status: DC
  Administered 2019-07-24 – 2019-07-28 (×8): 1250 mg via INTRAVENOUS
  Filled 2019-07-24 (×10): qty 1250

## 2019-07-24 MED ORDER — PIPERACILLIN-TAZOBACTAM 3.375 G IVPB
3.3750 g | Freq: Three times a day (TID) | INTRAVENOUS | Status: DC
  Administered 2019-07-24 – 2019-07-28 (×12): 3.375 g via INTRAVENOUS
  Filled 2019-07-24 (×13): qty 50

## 2019-07-24 MED ORDER — OSMOLITE 1.5 CAL PO LIQD
1000.0000 mL | ORAL | Status: DC
  Administered 2019-07-25: 1000 mL
  Filled 2019-07-24 (×3): qty 1000

## 2019-07-24 NOTE — Evaluation (Signed)
Physical Therapy Evaluation Patient Details Name: Brian Torres MRN: [id number] DOB: 06/29/1961 Today's Date: 07/24/2019   History of Present Illness  Patient is a 58 y/o male admitted with adenocarcinoma of the pancreatic head now s/p Diagnostic laparoscopy Classic pancreaticoduodenectomy  Placement of pancreatic duct stent 16 Fr jejunostomy feeding tube on 07/20/19.  Clinical Impression  Patient presents with decreased mobility due to pain, decreased balance, decreased activity tolerance and he will benefit from skilled PT in the acute setting to allow return home with family support and follow up HHPT.     Follow Up Recommendations Home health PT;Supervision/Assistance - 24 hour    Equipment Recommendations  Rolling walker with 5" wheels;3in1 (PT)(but pt reports can get needed equipment)    Recommendations for Other Services       Precautions / Restrictions Precautions Precautions: Fall Precaution Comments: RLQ drain, on PCA Restrictions Weight Bearing Restrictions: No      Mobility  Bed Mobility Overal bed mobility: Needs Assistance Bed Mobility: Supine to Sit     Supine to sit: HOB elevated;Min assist     General bed mobility comments: increased time, assist for HHA to lift trunk and cues, increased time to scoot to EOB  Transfers Overall transfer level: Needs assistance Equipment used: Rolling walker (2 wheeled) Transfers: Sit to/from Stand Sit to Stand: Min assist;+2 safety/equipment;From elevated surface         General transfer comment: pt requested to elevate height of bed, he is 6'4"  Ambulation/Gait Ambulation/Gait assistance: Min guard;+2 safety/equipment Gait Distance (Feet): 90 Feet Assistive device: Rolling walker (2 wheeled) Gait Pattern/deviations: Step-to pattern;Decreased stride length;Step-through pattern;Shuffle;Antalgic;Trunk flexed     General Gait Details: slow pace, increased flexed posture, throughout, cues to elevate trunk, wife  present and assisting with 1 IV pole  Stairs            Wheelchair Mobility    Modified Rankin (Stroke Patients Only)       Balance Overall balance assessment: Needs assistance Sitting-balance support: Feet supported Sitting balance-Leahy Scale: Fair     Standing balance support: Bilateral upper extremity supported Standing balance-Leahy Scale: Poor Standing balance comment: UE support for balance                             Pertinent Vitals/Pain Pain Assessment: 0-10 Pain Score: 3  Pain Location: in supine generalized, increased with mobility using PCA pump Pain Descriptors / Indicators: Discomfort;Grimacing Pain Intervention(s): Repositioned;Monitored during session;PCA encouraged    Home Living Family/patient expects to be discharged to:: Private residence   Available Help at Discharge: Family Type of Home: House Home Access: Stairs to enter   Technical brewer of Steps: 2 Home Layout: Two level;Able to live on main level with bedroom/bathroom Home Equipment: None Additional Comments: can get whatever equipment is needed    Prior Function Level of Independence: Independent               Hand Dominance        Extremity/Trunk Assessment   Upper Extremity Assessment Upper Extremity Assessment: Overall WFL for tasks assessed    Lower Extremity Assessment Lower Extremity Assessment: Generalized weakness       Communication   Communication: No difficulties  Cognition Arousal/Alertness: Awake/alert Behavior During Therapy: WFL for tasks assessed/performed Overall Cognitive Status: Impaired/Different from baseline  General Comments: admits to seeing bugs on the floor as result of medication      General Comments General comments (skin integrity, edema, etc.): HR up to 141 with ambulation, 121 after ambulation resting in recliner    Exercises     Assessment/Plan    PT  Assessment Patient needs continued PT services  PT Problem List Decreased strength;Decreased activity tolerance;Decreased mobility;Decreased safety awareness;Pain;Decreased balance       PT Treatment Interventions DME instruction;Stair training;Therapeutic activities;Balance training;Gait training;Functional mobility training;Therapeutic exercise;Patient/family education    PT Goals (Current goals can be found in the Care Plan section)  Acute Rehab PT Goals Patient Stated Goal: to walk PT Goal Formulation: With patient/family Time For Goal Achievement: 08/07/19 Potential to Achieve Goals: Good    Frequency Min 3X/week   Barriers to discharge        Co-evaluation               AM-PAC PT "6 Clicks" Mobility  Outcome Measure Help needed turning from your back to your side while in a flat bed without using bedrails?: A Little Help needed moving from lying on your back to sitting on the side of a flat bed without using bedrails?: A Little Help needed moving to and from a bed to a chair (including a wheelchair)?: A Little Help needed standing up from a chair using your arms (e.g., wheelchair or bedside chair)?: A Little Help needed to walk in hospital room?: A Little Help needed climbing 3-5 steps with a railing? : A Lot 6 Click Score: 17    End of Session   Activity Tolerance: Patient tolerated treatment well Patient left: in chair;with call bell/phone within reach;with family/visitor present Nurse Communication: Mobility status PT Visit Diagnosis: Difficulty in walking, not elsewhere classified (R26.2)    Time: [id number] PT Time Calculation (min) (ACUTE ONLY): 38 min   Charges:   PT Evaluation $PT Eval Moderate Complexity: 1 Mod PT Treatments $Gait Training: 8-22 mins $Therapeutic Activity: 8-22 mins        Magda Kiel, PT Acute Rehabilitation Services [phone] 07/24/2019   Reginia Naas 07/24/2019, 5:51 PM

## 2019-07-24 NOTE — Progress Notes (Addendum)
4 Days Post-Op   Subjective/Chief Complaint: Had fever overnight.    Objective: Vital signs in last 24 hours: Temp:  [98.6 F (37 C)-101.7 F (38.7 C)] 100.2 F (37.9 C) (09/21 0347) Pulse Rate:  [82-112] 91 (09/21 0700) Resp:  [12-25] 14 (09/21 0812) BP: (124-160)/(64-80) 124/64 (09/21 0700) SpO2:  [96 %-100 %] 99 % (09/21 0812) Last BM Date: 07/22/19  Intake/Output from previous day: 09/20 0701 - 09/21 0700 In: 4137.5 [I.V.:2959.4; NG/GT:220; IV Piggyback:598.5] Out: 1790 [Urine:1725; Drains:65] Intake/Output this shift: No intake/output data recorded.  Constitutional: sleepy, but arousable.   Lungs: Normal respiratory effort CV: regular rate and rhythm, no peripheral edema, pedal pulses 2+ GI: Soft, no masses or hepatosplenomegaly, approp ttp, J tube in place, midline inc c/d/i, JP SS Skin: No rashes, palpation reveals normal turgor Psychiatric: appropriate judgment and insight, oriented to person, place, and time   Lab Results:  Recent Labs    07/23/19 0600 07/24/19 0516  WBC 7.0 7.9  HGB 8.7* 7.9*  HCT 25.8* 24.6*  PLT 124* 141*   BMET Recent Labs    07/23/19 0600 07/24/19 0516  NA 137 133*  K 3.3* 4.3  CL 112* 101  CO2 20* 21*  GLUCOSE 78 211*  BUN 12 15  CREATININE 0.70 1.15  CALCIUM 6.6* 7.6*   PT/INR No results for input(s): LABPROT, INR in the last 72 hours. ABG No results for input(s): PHART, HCO3 in the last 72 hours.  Invalid input(s): PCO2, PO2  Studies/Results: Dg Chest Port 1 View  Result Date: 07/22/2019 CLINICAL DATA:  Respiratory difficulty, fever EXAM: PORTABLE CHEST 1 VIEW COMPARISON:  02/02/2019 FINDINGS: The heart size and mediastinal contours are within normal limits. Left chest port catheter. Both lungs are clear. The visualized skeletal structures are unremarkable. IMPRESSION: No acute abnormality of the lungs in AP portable projection. Electronically Signed   By: Eddie Candle M.D.   On: 07/22/2019 16:30     Anti-infectives: Anti-infectives (From admission, onward)   Start     Dose/Rate Route Frequency Ordered Stop   07/20/19 2000  ceFAZolin (ANCEF) IVPB 2g/100 mL premix     2 g 200 mL/hr over 30 Minutes Intravenous Every 8 hours 07/20/19 1523 07/20/19 2122   07/20/19 0630  ceFAZolin (ANCEF) IVPB 2g/100 mL premix     2 g 200 mL/hr over 30 Minutes Intravenous On call to O.R. 07/20/19 0622 07/20/19 1203   07/20/19 0629  ceFAZolin (ANCEF) 2-4 GM/100ML-% IVPB    Note to Pharmacy: Nyoka Cowden   : cabinet override      07/20/19 0629 07/20/19 0758      Assessment/Plan: s/p Procedure(s): LAPAROSCOPY DIAGNOSTIC (N/A) WHIPPLE PROCEDURE (N/A) FEEDING TUBE (N/A) con't foley while epidural in Tube feeds to 30 ml/hr Fever- send cultures, ua cxr, empiric antibiotics. Sips of clears. Hopefully d/c epidural and foley tomorrow.   DM - lantus and SSI   LOS: 4 days    [name] 07/24/2019

## 2019-07-24 NOTE — Progress Notes (Signed)
Pharmacy Antibiotic Note  Brian Torres is a 58 y.o. male with a history of pancreatic cancer undergoing chemotherapy. Pt is 4 days post-op Whipple procedure, now febrile, Tmax 100.7. Cultures sent and now starting empiric antibiotics.   Plan: Received vancomycin 1750 mg x1. Start vancomycin 1250 mg q12h for est AUC of 500  Follow up cultures  Monitor renal function as pt is also on Zosyn   Height: 6\' 4"  (193 cm) Weight: 202 lb (91.6 kg) IBW/kg (Calculated) : 86.8  Temp (24hrs), Avg:100.2 F (37.9 C), Min:98.6 F (37 C), Max:101.7 F (38.7 C)  Recent Labs  Lab 07/20/19 2008 07/21/19 0753 07/22/19 0552 07/22/19 1730 07/23/19 0600 07/24/19 0516  WBC  --  7.2 7.2 5.4 7.0 7.9  CREATININE 1.29* 1.46* 0.95  --  0.70 1.15    Estimated Creatinine Clearance: 86 mL/min (by C-G formula based on SCr of 1.15 mg/dL).    No Known Allergies   Thank you for allowing pharmacy to be a part of this patient's care.   Jonathon Bellows, PharmD Candidate 07/24/2019 10:37 AM

## 2019-07-24 NOTE — Progress Notes (Signed)
Nutrition Follow-up  DOCUMENTATION CODES:   Not applicable  INTERVENTION:   Osmolite 1.5 @ 30 ml/hr via j-tube   Recommend advancing TF to goal rate of 65 ml/hr Recommend 30 ml Prostat BID  TF regimen will provide: 2540 kcal, 127 grams protein, and 1191 ml free water.    NUTRITION DIAGNOSIS:   Increased nutrient needs related to cancer and cancer related treatments as evidenced by estimated needs. Ongoing.   GOAL:   Patient will meet greater than or equal to 90% of their needs Progressing  MONITOR:   Diet advancement, TF tolerance  REASON FOR ASSESSMENT:   Malnutrition Screening Tool, Consult Enteral/tube feeding initiation and management  ASSESSMENT:   Pt with PMH of dyslipidemia, smoker DM, and HTN who was dx with pancreatic cancer 12/2018 s/p ERCP, EUS, and 8 cycles of FOLFIRINOX pt now admitted for surgery. Pt s/p whipple 9/17.   Pt discussed during ICU rounds and with RN.  Pt with fevers and nausea over the weekend per RN, TF held to 20 ml/hr. TF advanced to 30 ml/hr this am.  NG tube out, MD ok with sips of clears. Still has epidural noted possible d/c tomorrow.   Medications reviewed and include: SSI, lantus  D5 1/2 NS @ 75 ml/hr  Labs reviewed: Na 133 (L) CBG's: [phone]   Drains: 65 ml x 24 hrs  Diet Order:   Diet Order            Diet NPO time specified Except for: Ice Chips, Sips with Meds  Diet effective now              EDUCATION NEEDS:   Education needs have been addressed  Skin:  Skin Assessment: Reviewed RN Assessment  Last BM:  9/19 large  Height:   Ht Readings from Last 1 Encounters:  07/20/19 6\' 4"  (1.93 m)    Weight:   Wt Readings from Last 1 Encounters:  07/20/19 91.6 kg    Ideal Body Weight:  91.8 kg  BMI:  Body mass index is 24.59 kg/m.  Estimated Nutritional Needs:   Kcal:  [id number]  Protein:  120-145 grams  Fluid:  >2L/day  Maylon Peppers RD, LDN, CNSC [phone] Pager [phone] After Hours  Pager

## 2019-07-24 NOTE — Anesthesia Post-op Follow-up Note (Signed)
  Anesthesia Pain Follow-up Note  Patient: Brian Torres  Day #: 4  Date of Follow-up: 07/24/2019 Time: 5:12 PM  Last Vitals:  Vitals:   07/24/19 1200 07/24/19 1652  BP:    Pulse:    Resp:  14  Temp: 36.8 C   SpO2:  97%    Level of Consciousness: alert, conversant, patient able to ambulate  Pain: 3 /10, mild   Side Effects:None  Catheter Site Exam:clean, dry, no drainage  Epidural / Intrathecal (From admission, onward)   Start     Dose/Rate Route Frequency Ordered Stop   07/20/19 1430  ropivacaine (PF) 2 mg/mL (0.2%) (NAROPIN) injection     10 mL/hr 10 mL/hr  Epidural Continuous 07/20/19 1427         Plan: Continue current therapy of postop epidural at surgeon's request  [name],E. Tzipporah Nagorski

## 2019-07-24 NOTE — Anesthesia Post-op Follow-up Note (Signed)
  Anesthesia Pain Follow-up Note  Patient: Brian Torres  Day #: 3  Date of Follow-up: 07/24/2019 Time: 4:43 AM  Last Vitals:  Vitals:   07/24/19 0300 07/24/19 0347  BP: (!) 154/80   Pulse: (!) 108   Resp: (!) 24   Temp:  37.9 C  SpO2: 97%     Level of Consciousness: alert  Pain: mild   Side Effects:None  Catheter Site Exam:clean, dry, no drainage  Epidural / Intrathecal (From admission, onward)   Start     Dose/Rate Route Frequency Ordered Stop   07/20/19 1430  ropivacaine (PF) 2 mg/mL (0.2%) (NAROPIN) injection     10 mL/hr 10 mL/hr  Epidural Continuous 07/20/19 1427         Plan: Continue current therapy of postop epidural at surgeon's request  Catalina Gravel

## 2019-07-25 LAB — COMPREHENSIVE METABOLIC PANEL
ALT: 24 U/L (ref 0–44)
AST: 24 U/L (ref 15–41)
Albumin: 2.4 g/dL — ABNORMAL LOW (ref 3.5–5.0)
Alkaline Phosphatase: 190 U/L — ABNORMAL HIGH (ref 38–126)
Anion gap: 12 (ref 5–15)
BUN: 14 mg/dL (ref 6–20)
CO2: 22 mmol/L (ref 22–32)
Calcium: 8 mg/dL — ABNORMAL LOW (ref 8.9–10.3)
Chloride: 104 mmol/L (ref 98–111)
Creatinine, Ser: 1.08 mg/dL (ref 0.61–1.24)
GFR calc Af Amer: >60 mL/min (ref >60)
GFR calc non Af Amer: >60 mL/min (ref >60)
Glucose, Bld: 220 mg/dL — ABNORMAL HIGH (ref 70–99)
Potassium: 4 mmol/L (ref 3.5–5.1)
Sodium: 138 mmol/L (ref 135–145)
Total Bilirubin: 0.8 mg/dL (ref 0.3–1.2)
Total Protein: 5.2 g/dL — ABNORMAL LOW (ref 6.5–8.1)

## 2019-07-25 LAB — CBC
HCT: 25.3 % — ABNORMAL LOW (ref 39.0–52.0)
Hemoglobin: 8.4 g/dL — ABNORMAL LOW (ref 13.0–17.0)
MCH: 31.6 pg (ref 26.0–34.0)
MCHC: 33.2 g/dL (ref 30.0–36.0)
MCV: 95.1 fL (ref 80.0–100.0)
Platelets: 144 10*3/uL — ABNORMAL LOW (ref 150–400)
RBC: 2.66 MIL/uL — ABNORMAL LOW (ref 4.22–5.81)
RDW: 14.7 % (ref 11.5–15.5)
WBC: 7.1 10*3/uL (ref 4.0–10.5)
nRBC: 0 % (ref 0.0–0.2)

## 2019-07-25 LAB — GLUCOSE, CAPILLARY
Glucose-Capillary: 189 mg/dL — ABNORMAL HIGH (ref 70–99)
Glucose-Capillary: 194 mg/dL — ABNORMAL HIGH (ref 70–99)
Glucose-Capillary: 209 mg/dL — ABNORMAL HIGH (ref 70–99)
Glucose-Capillary: 245 mg/dL — ABNORMAL HIGH (ref 70–99)
Glucose-Capillary: 304 mg/dL — ABNORMAL HIGH (ref 70–99)

## 2019-07-25 MED ORDER — PNEUMOCOCCAL VAC POLYVALENT 25 MCG/0.5ML IJ INJ: 0.5000 mL | INJECTION | INTRAMUSCULAR | Status: DC

## 2019-07-25 MED ORDER — OXYCODONE HCL 5 MG PO TABS
5.0000 mg | ORAL_TABLET | ORAL | Status: DC | PRN
  Administered 2019-07-26 – 2019-07-29 (×8): 10 mg via ORAL
  Filled 2019-07-25 (×10): qty 2

## 2019-07-25 MED ORDER — ENOXAPARIN SODIUM 40 MG/0.4ML ~~LOC~~ SOLN
40.0000 mg | SUBCUTANEOUS | Status: DC
  Administered 2019-07-26 – 2019-07-27 (×2): 40 mg via SUBCUTANEOUS
  Filled 2019-07-25 (×2): qty 0.4

## 2019-07-25 MED ORDER — INFLUENZA VAC SPLIT QUAD 0.5 ML IM SUSY
0.5000 mL | PREFILLED_SYRINGE | INTRAMUSCULAR | Status: DC
  Filled 2019-07-25: qty 0.5

## 2019-07-25 NOTE — Progress Notes (Signed)
5 Days Post-Op   Subjective/Chief Complaint: Doing well.  Had several BMs.  No n/v.    Objective: Vital signs in last 24 hours: Temp:  [98.7 F (37.1 C)-102.3 F (39.1 C)] 100.9 F (38.3 C) (09/22 1200) Pulse Rate:  [78-105] 89 (09/22 1500) Resp:  [14-24] 21 (09/22 1500) BP: (138-160)/(65-90) 145/76 (09/22 1500) SpO2:  [97 %-100 %] 100 % (09/22 1500) FiO2 (%):  [99 %] 99 % (09/21 2027) Last BM Date: 07/22/19  Intake/Output from previous day: 09/21 0701 - 09/22 0700 In: 2729.4 [P.O.:60; I.V.:1693.9; NG/GT:100; IV Piggyback:639.2] Out: 1100 [Urine:1050; Drains:50] Intake/Output this shift: No intake/output data recorded.  Constitutional: More  Lungs: Normal respiratory effort CV: regular rate and rhythm, no peripheral edema, pedal pulses 2+ GI: soft, non tender, sl distended. Drains serosang.   Skin: No rashes, palpation reveals normal turgor Psychiatric: appropriate judgment and insight, oriented to person, place, and time   Lab Results:  Recent Labs    07/24/19 0516 07/25/19 0500  WBC 7.9 7.1  HGB 7.9* 8.4*  HCT 24.6* 25.3*  PLT 141* 144*   BMET Recent Labs    07/24/19 0516 07/25/19 0500  NA 133* 138  K 4.3 4.0  CL 101 104  CO2 21* 22  GLUCOSE 211* 220*  BUN 15 14  CREATININE 1.15 1.08  CALCIUM 7.6* 8.0*   PT/INR No results for input(s): LABPROT, INR in the last 72 hours. ABG No results for input(s): PHART, HCO3 in the last 72 hours.  Invalid input(s): PCO2, PO2  Studies/Results: Dg Chest Port 1 View  Result Date: 07/24/2019 CLINICAL DATA:  Fever EXAM: PORTABLE CHEST 1 VIEW COMPARISON:  07/22/2019 FINDINGS: The heart size and mediastinal contours are within normal limits. Left chest port catheter. Interval increase in heterogeneous opacity of the left lung base, which is somewhat bandlike and may reflect atelectasis or alternately airspace disease. The visualized skeletal structures are unremarkable. IMPRESSION: Interval increase in heterogeneous  opacity of the left lung base, which is somewhat bandlike and may reflect atelectasis or alternately developing airspace disease in the setting of fever. Electronically Signed   By: Eddie Candle M.D.   On: 07/24/2019 09:27    Anti-infectives: Anti-infectives (From admission, onward)   Start     Dose/Rate Route Frequency Ordered Stop   07/24/19 2200  vancomycin (VANCOCIN) 1,250 mg in sodium chloride 0.9 % 250 mL IVPB     1,250 mg 166.7 mL/hr over 90 Minutes Intravenous Every 12 hours 07/24/19 1027     07/24/19 1000  vancomycin (VANCOCIN) 1,750 mg in sodium chloride 0.9 % 500 mL IVPB     1,750 mg 250 mL/hr over 120 Minutes Intravenous  Once 07/24/19 0927 07/24/19 1158   07/24/19 1000  piperacillin-tazobactam (ZOSYN) IVPB 3.375 g     3.375 g 12.5 mL/hr over 240 Minutes Intravenous Every 8 hours 07/24/19 0927     07/20/19 2000  ceFAZolin (ANCEF) IVPB 2g/100 mL premix     2 g 200 mL/hr over 30 Minutes Intravenous Every 8 hours 07/20/19 1523 07/20/19 2122   07/20/19 0630  ceFAZolin (ANCEF) IVPB 2g/100 mL premix     2 g 200 mL/hr over 30 Minutes Intravenous On call to O.R. 07/20/19 0622 07/20/19 1203   07/20/19 0629  ceFAZolin (ANCEF) 2-4 GM/100ML-% IVPB    Note to Pharmacy: Nyoka Cowden   : cabinet override      07/20/19 0629 07/20/19 0758      Assessment/Plan: s/p Procedure(s): LAPAROSCOPY DIAGNOSTIC (N/A) WHIPPLE PROCEDURE (N/A) FEEDING TUBE (  N/A)  con't foley while epidural in Tube feeds stay at 30 ml/hr given bloating.   Fever- CXR with possible infiltrate.   Clears D/c epidural and foley afterward. Start lovenox DM - lantus and SSI    LOS: 5 days    [name] 07/25/2019

## 2019-07-25 NOTE — Anesthesia Post-op Follow-up Note (Signed)
  Anesthesia Pain Follow-up Note  Patient: Brian Torres  Day #: 5  Date of Follow-up: 07/25/2019 Time: 6:41 PM  Last Vitals:  Vitals:   07/25/19 1701 07/25/19 1800  BP:  (!) 164/75  Pulse:  (!) 102  Resp: (!) 22 19  Temp:    SpO2: 100% 99%    Level of Consciousness: alert  Pain: mild   Side Effects:None  Catheter Site Exam:clean, dry, no drainage  Anti-Coag Meds (From admission, onward)   Start     Dose/Rate Route Frequency Ordered Stop   07/26/19 0000  enoxaparin (LOVENOX) injection 40 mg    Note to Pharmacy: At least 8 hours post epidural d/c.   40 mg Subcutaneous Every 24 hours 07/25/19 0804         Plan: Catheter removed/tip intact at surgeon's request, D/C Infusion at surgeon's request and D/C from anesthesia care at surgeon's request  Nolon Nations

## 2019-07-25 NOTE — Progress Notes (Signed)
Physical Therapy Treatment Patient Details Name: Brian Torres MRN: [id number] DOB: January 27, 1961 Today's Date: 07/25/2019    History of Present Illness Patient is a 58 y/o male admitted with adenocarcinoma of the pancreatic head now s/p Diagnostic laparoscopy Classic pancreaticoduodenectomy  Placement of pancreatic duct stent 16 Fr jejunostomy feeding tube on 07/20/19.    PT Comments    Patient with limited progress but able to get OOB to chair with less assist for bed mobility.  He was unwilling to walk due to drainage from the wound and initially wanted to stay in the bed, but did tolerate OOB to chair this pm.  Feel continued skilled PT needed prior to d/c home with wife assist and follow up HHPT.    Follow Up Recommendations  Home health PT;Supervision/Assistance - 24 hour     Equipment Recommendations  Rolling walker with 5" wheels;3in1 (PT)    Recommendations for Other Services       Precautions / Restrictions Precautions Precautions: Fall Precaution Comments: RLQ drain, on PCA    Mobility  Bed Mobility Overal bed mobility: Needs Assistance Bed Mobility: Supine to Sit     Supine to sit: HOB elevated;Supervision     General bed mobility comments: increased time needed, but no physical help given  Transfers Overall transfer level: Needs assistance Equipment used: Rolling walker (2 wheeled) Transfers: Sit to/from [facility] Sit to Stand: From elevated surface;Min guard Stand pivot transfers: Min guard       General transfer comment: up to chair only due to draining from abdominal incision and wife concerned, also waiting all day to get epidural out  Ambulation/Gait                 Stairs             Wheelchair Mobility    Modified Rankin (Stroke Patients Only)       Balance Overall balance assessment: Needs assistance   Sitting balance-Leahy Scale: Good     Standing balance support: Bilateral upper extremity  supported Standing balance-Leahy Scale: Poor Standing balance comment: leaning on walker with UE's and somewhat flexed posture                            Cognition   Behavior During Therapy: WFL for tasks assessed/performed Overall Cognitive Status: Within Functional Limits for tasks assessed                                        Exercises      General Comments General comments (skin integrity, edema, etc.): wife in room and concerned about drainage, then agreed he needed to at least get up to chair, pt initially unwilling, but with RN encouragement agreed to up to chair      Pertinent Vitals/Pain Pain Score: 3  Pain Location: in supine generalized, increased with mobility using PCA pump Pain Descriptors / Indicators: Discomfort;Grimacing Pain Intervention(s): Monitored during session;Repositioned    Home Living                      Prior Function            PT Goals (current goals can now be found in the care plan section) Progress towards PT goals: Not progressing toward goals - comment(limited by wound drainage)    Frequency    Min  3X/week      PT Plan Current plan remains appropriate    Co-evaluation              AM-PAC PT "6 Clicks" Mobility   Outcome Measure  Help needed turning from your back to your side while in a flat bed without using bedrails?: A Little Help needed moving from lying on your back to sitting on the side of a flat bed without using bedrails?: A Little Help needed moving to and from a bed to a chair (including a wheelchair)?: A Little Help needed standing up from a chair using your arms (e.g., wheelchair or bedside chair)?: A Little Help needed to walk in hospital room?: A Little Help needed climbing 3-5 steps with a railing? : A Little 6 Click Score: 18    End of Session Equipment Utilized During Treatment: Oxygen Activity Tolerance: Patient tolerated treatment well Patient left: in  chair;with call bell/phone within reach;with family/visitor present   PT Visit Diagnosis: Difficulty in walking, not elsewhere classified (R26.2)     Time: [id number] PT Time Calculation (min) (ACUTE ONLY): 19 min  Charges:  $Therapeutic Activity: 8-22 mins                     Magda Kiel, [place] [phone] 07/25/2019    Reginia Naas 07/25/2019, 5:04 PM

## 2019-07-26 LAB — GLUCOSE, CAPILLARY
Glucose-Capillary: 159 mg/dL — ABNORMAL HIGH (ref 70–99)
Glucose-Capillary: 201 mg/dL — ABNORMAL HIGH (ref 70–99)
Glucose-Capillary: 206 mg/dL — ABNORMAL HIGH (ref 70–99)
Glucose-Capillary: 207 mg/dL — ABNORMAL HIGH (ref 70–99)
Glucose-Capillary: 209 mg/dL — ABNORMAL HIGH (ref 70–99)
Glucose-Capillary: 225 mg/dL — ABNORMAL HIGH (ref 70–99)
Glucose-Capillary: 230 mg/dL — ABNORMAL HIGH (ref 70–99)
Glucose-Capillary: 250 mg/dL — ABNORMAL HIGH (ref 70–99)

## 2019-07-26 LAB — LIPASE, FLUID
Lipase-Fluid: 5 U/L
Lipase-Fluid: 5 U/L

## 2019-07-26 MED ORDER — HYDROMORPHONE HCL 1 MG/ML IJ SOLN
0.5000 mg | INTRAMUSCULAR | Status: DC | PRN
  Administered 2019-07-27 (×2): 1 mg via INTRAVENOUS
  Filled 2019-07-26 (×2): qty 1

## 2019-07-26 MED ORDER — OSMOLITE 1.5 CAL PO LIQD
630.0000 mL | ORAL | Status: DC
  Filled 2019-07-26 (×4): qty 711
  Filled 2019-07-26 (×2): qty 1000
  Filled 2019-07-26: qty 711
  Filled 2019-07-26: qty 1000

## 2019-07-26 MED ORDER — INSULIN GLARGINE 100 UNIT/ML ~~LOC~~ SOLN
16.0000 [IU] | Freq: Every day | SUBCUTANEOUS | Status: DC
  Administered 2019-07-26 – 2019-07-28 (×3): 16 [IU] via SUBCUTANEOUS
  Filled 2019-07-26 (×4): qty 0.16

## 2019-07-26 MED ORDER — METHOCARBAMOL 500 MG PO TABS
500.0000 mg | ORAL_TABLET | Freq: Four times a day (QID) | ORAL | Status: DC | PRN
  Administered 2019-07-26 – 2019-07-29 (×6): 500 mg via ORAL
  Filled 2019-07-26 (×6): qty 1

## 2019-07-26 MED ORDER — ENSURE ENLIVE PO LIQD
237.0000 mL | Freq: Three times a day (TID) | ORAL | Status: DC
  Administered 2019-07-26 – 2019-07-29 (×4): 237 mL via ORAL

## 2019-07-26 MED ORDER — PRO-STAT SUGAR FREE PO LIQD
30.0000 mL | Freq: Three times a day (TID) | ORAL | Status: DC
  Administered 2019-07-26 – 2019-07-29 (×7): 30 mL
  Filled 2019-07-26 (×8): qty 30

## 2019-07-26 NOTE — Progress Notes (Signed)
6 Days Post-Op   Subjective/Chief Complaint: Pt developed wound drainage yesterday afternoon.  He is doing a bit better today.  Tolerated clear liquids.    Objective: Vital signs in last 24 hours: Temp:  [99 F (37.2 C)-100.9 F (38.3 C)] 99.2 F (37.3 C) (09/23 0800) Pulse Rate:  [78-114] 95 (09/23 0800) Resp:  [15-24] 20 (09/23 0800) BP: (138-174)/(65-79) 158/79 (09/23 0800) SpO2:  [93 %-100 %] 95 % (09/23 0800) FiO2 (%):  [0 %] 0 % (09/23 0456) Last BM Date: 07/24/19  Intake/Output from previous day: 09/22 0701 - 09/23 0700 In: 3570.9 [I.V.:1414.4; NG/GT:1289.5; IV Piggyback:735.6] Out: 1740 [Urine:1725; Drains:15] Intake/Output this shift: No intake/output data recorded.  Constitutional: More  Lungs: Normal respiratory effort CV: regular rate and rhythm, no peripheral edema, pedal pulses 2+ GI: soft, approp tender.  Less distended than yesterday.  Purulent drainage from midline wound.  All staples removed.  Purulent drainage evacuated.  One drain is murky, second drain serosang.   Skin: No rashes, palpation reveals normal turgor Psychiatric: appropriate judgment and insight, oriented to person, place, and time   Lab Results:  Recent Labs    07/24/19 0516 07/25/19 0500  WBC 7.9 7.1  HGB 7.9* 8.4*  HCT 24.6* 25.3*  PLT 141* 144*   BMET Recent Labs    07/24/19 0516 07/25/19 0500  NA 133* 138  K 4.3 4.0  CL 101 104  CO2 21* 22  GLUCOSE 211* 220*  BUN 15 14  CREATININE 1.15 1.08  CALCIUM 7.6* 8.0*   PT/INR No results for input(s): LABPROT, INR in the last 72 hours. ABG No results for input(s): PHART, HCO3 in the last 72 hours.  Invalid input(s): PCO2, PO2  Studies/Results: Dg Chest Port 1 View  Result Date: 07/24/2019 CLINICAL DATA:  Fever EXAM: PORTABLE CHEST 1 VIEW COMPARISON:  07/22/2019 FINDINGS: The heart size and mediastinal contours are within normal limits. Left chest port catheter. Interval increase in heterogeneous opacity of the left lung  base, which is somewhat bandlike and may reflect atelectasis or alternately airspace disease. The visualized skeletal structures are unremarkable. IMPRESSION: Interval increase in heterogeneous opacity of the left lung base, which is somewhat bandlike and may reflect atelectasis or alternately developing airspace disease in the setting of fever. Electronically Signed   By: Eddie Candle M.D.   On: 07/24/2019 09:27    Anti-infectives: Anti-infectives (From admission, onward)   Start     Dose/Rate Route Frequency Ordered Stop   07/24/19 2200  vancomycin (VANCOCIN) 1,250 mg in sodium chloride 0.9 % 250 mL IVPB     1,250 mg 166.7 mL/hr over 90 Minutes Intravenous Every 12 hours 07/24/19 1027     07/24/19 1000  vancomycin (VANCOCIN) 1,750 mg in sodium chloride 0.9 % 500 mL IVPB     1,750 mg 250 mL/hr over 120 Minutes Intravenous  Once 07/24/19 0927 07/24/19 1158   07/24/19 1000  piperacillin-tazobactam (ZOSYN) IVPB 3.375 g     3.375 g 12.5 mL/hr over 240 Minutes Intravenous Every 8 hours 07/24/19 0927     07/20/19 2000  ceFAZolin (ANCEF) IVPB 2g/100 mL premix     2 g 200 mL/hr over 30 Minutes Intravenous Every 8 hours 07/20/19 1523 07/20/19 2122   07/20/19 0630  ceFAZolin (ANCEF) IVPB 2g/100 mL premix     2 g 200 mL/hr over 30 Minutes Intravenous On call to O.R. 07/20/19 0622 07/20/19 1203   07/20/19 0629  ceFAZolin (ANCEF) 2-4 GM/100ML-% IVPB    Note to Pharmacy: [name],  [name]   : cabinet override      07/20/19 0629 07/20/19 0758      Assessment/Plan: s/p Procedure(s): LAPAROSCOPY DIAGNOSTIC (N/A) WHIPPLE PROCEDURE (N/A) FEEDING TUBE (N/A)  Epidural and foley out.   Tube feeds adjust to 45 ml/hr for 14 hours.   D/c pca today with prn dilaudid and oxy.  Fever- most likely from midline wound.  Also one drain is murky.  Start dressing changes.   Full liquids today.   Lovenox for VTE ppx.  DM - lantus (increase this) and SSI    LOS: 6 days    Brian Torres 07/26/2019

## 2019-07-26 NOTE — Evaluation (Signed)
Occupational Therapy Evaluation Patient Details Name: Brian Torres MRN: [id number] DOB: [birth year] Today's Date: 07/26/2019    History of Present Illness Patient is a 58 y/o male admitted with adenocarcinoma of the pancreatic head now s/p Diagnostic laparoscopy Classic pancreaticoduodenectomy  Placement of pancreatic duct stent 16 Fr jejunostomy feeding tube on 07/20/19.   Clinical Impression   Pt was functioning independently prior to admission. Presents with generalized weakness, impaired standing balance and requires set up to max assist for ADL. Pt ambulated within room with min guard assist and RW and remained in chair at end of session. Educated wife in use of 3 in 1 to elevate toilet and as a shower seat at home. Will follow acutely.    Follow Up Recommendations  Home health OT    Equipment Recommendations  3 in 1 bedside commode    Recommendations for Other Services       Precautions / Restrictions Precautions Precautions: Fall Precaution Comments: RLQ drain, on PCA Restrictions Weight Bearing Restrictions: No      Mobility Bed Mobility Overal bed mobility: Needs Assistance Bed Mobility: Supine to Sit     Supine to sit: HOB elevated;Supervision     General bed mobility comments: increased time needed, but no physical help given  Transfers Overall transfer level: Needs assistance Equipment used: Rolling walker (2 wheeled) Transfers: Sit to/from Stand Sit to Stand: Min guard         General transfer comment: cues for hand placement, walked to window and back toward bed to sit in chair    Balance Overall balance assessment: Needs assistance   Sitting balance-Leahy Scale: Good       Standing balance-Leahy Scale: Poor                             ADL either performed or assessed with clinical judgement   ADL Overall ADL's : Needs assistance/impaired Eating/Feeding: Independent;Sitting   Grooming: Wash/dry hands;Wash/dry  face;Sitting;Set up   Upper Body Bathing: Moderate assistance;Sitting   Lower Body Bathing: Maximal assistance;Sit to/from stand   Upper Body Dressing : Minimal assistance;Sitting   Lower Body Dressing: Maximal assistance;Sit to/from stand   Toilet Transfer: Min guard;Ambulation;RW;BSC           Functional mobility during ADLs: Min guard;Rolling walker       Vision Baseline Vision/History: Cataracts Patient Visual Report: No change from baseline;Blurring of vision Additional Comments: scheduled to have cataracts removed in January     Perception     Praxis      Pertinent Vitals/Pain Pain Assessment: No/denies pain Pain Location: using PCA Pain Intervention(s): PCA encouraged     Hand Dominance Right   Extremity/Trunk Assessment Upper Extremity Assessment Upper Extremity Assessment: Overall WFL for tasks assessed;Generalized weakness   Lower Extremity Assessment Lower Extremity Assessment: Defer to PT evaluation   Cervical / Trunk Assessment Cervical / Trunk Assessment: Normal;Other exceptions(abdominal wound)   Communication Communication Communication: No difficulties   Cognition Arousal/Alertness: Awake/alert;Lethargic;Suspect due to medications Behavior During Therapy: [facility] for tasks assessed/performed Overall Cognitive Status: Within Functional Limits for tasks assessed                                 General Comments: a little slow mentation, likely due to pain meds   General Comments       Exercises     Shoulder Instructions  Home Living Family/patient expects to be discharged to:: Private residence Living Arrangements: Spouse/significant other Available Help at Discharge: Family Type of Home: House Home Access: Stairs to enter [employer] of Steps: 2   Home Layout: Two level;Able to live on main level with bedroom/bathroom     Bathroom Shower/Tub: Occupational psychologist: Standard     Home  Equipment: None   Additional Comments: has access to a RW      Prior Functioning/Environment Level of Independence: Independent                 OT Problem List: Decreased strength;Decreased activity tolerance;Impaired balance (sitting and/or standing);Decreased knowledge of use of DME or AE      OT Treatment/Interventions: Self-care/ADL training;DME and/or AE instruction;Therapeutic activities;Patient/family education;Balance training    OT Goals(Current goals can be found in the care plan section) Acute Rehab OT Goals Patient Stated Goal: to return to playing golf OT Goal Formulation: With patient Time For Goal Achievement: 08/09/19 Potential to Achieve Goals: Good ADL Goals Pt Will Perform Grooming: with supervision;standing Pt Will Perform Lower Body Bathing: with supervision;sit to/from stand Pt Will Perform Lower Body Dressing: with supervision;sit to/from stand Pt Will Transfer to Toilet: with supervision;ambulating;bedside commode Pt Will Perform Toileting - Clothing Manipulation and hygiene: with supervision;sit to/from stand  OT Frequency: Min 2X/week   Barriers to D/C:            Co-evaluation              AM-PAC OT "6 Clicks" Daily Activity     Outcome Measure Help from another person eating meals?: None Help from another person taking care of personal grooming?: A Little Help from another person toileting, which includes using toliet, bedpan, or urinal?: Total Help from another person bathing (including washing, rinsing, drying)?: Total Help from another person to put on and taking off regular upper body clothing?: A Little Help from another person to put on and taking off regular lower body clothing?: A Lot 6 Click Score: 14   End of Session Equipment Utilized During Treatment: Rolling walker  Activity Tolerance: Patient tolerated treatment well Patient left: in chair;with call bell/phone within reach;with family/visitor present  OT Visit  Diagnosis: Unsteadiness on feet (R26.81);Other abnormalities of gait and mobility (R26.89);Muscle weakness (generalized) (M62.81)                Time: [id number] OT Time Calculation (min): 45 min Charges:  OT General Charges $OT Visit: 1 Visit OT Evaluation $OT Eval Moderate Complexity: 1 Mod OT Treatments $Self Care/Home Management : 23-37 mins  Nestor Lewandowsky, OTR/L Acute Rehabilitation Services Pager: [phone] Office: [phone]  Malka So 07/26/2019, 10:26 AM

## 2019-07-26 NOTE — Progress Notes (Signed)
Inpatient Diabetes Program Recommendations  AACE/ADA: New Consensus Statement on Inpatient Glycemic Control (2015)  Target Ranges:  Prepandial:   less than 140 mg/dL      Peak postprandial:   less than 180 mg/dL (1-2 hours)      Critically ill patients:  140 - 180 mg/dL   Lab Results  Component Value Date   GLUCAP 250 (H) 07/26/2019   HGBA1C 8.6 (H) 07/12/2019    Review of Glycemic Control  Blood sugars 206-250 mg/dL today. May benefit from adding TF coverage.  Inpatient Diabetes Program Recommendations:     Consider adding Novolog 2-3 units Q4H for TF coverage.  Will continue to follow closely.  Thank you. Lorenda Peck, RD, LDN, CDE Inpatient Diabetes Coordinator [phone]

## 2019-07-26 NOTE — Progress Notes (Signed)
Nutrition Follow-up  DOCUMENTATION CODES:   Not applicable  INTERVENTION:  Provide Ensure Enlive po TID, each supplement provides 350 kcal and 20 grams of protein.  Continue Osmolite 1.5 formula via J-tube at rate of 45 ml/hr x 14 hours. 630 ml total daily.   Provide 30 ml Prostat TID.   Tube feeding to provide 1245 kcal (52% of kcal needs), 85 grams of protein (71% of protein needs), 479 ml water.   NUTRITION DIAGNOSIS:   Increased nutrient needs related to cancer and cancer related treatments as evidenced by estimated needs; ongoing  GOAL:   Patient will meet greater than or equal to 90% of their needs; progressing  MONITOR:   Diet advancement, TF tolerance  REASON FOR ASSESSMENT:   Malnutrition Screening Tool, Consult Enteral/tube feeding initiation and management  ASSESSMENT:   Pt with PMH of dyslipidemia, smoker DM, and HTN who was dx with pancreatic cancer 12/2018 s/p ERCP, EUS, and 8 cycles of FOLFIRINOX pt now admitted for surgery. Pt s/p whipple 9/17.  Diet has been advanced to a full liquid diet. Pt had been tolerating his clear liquid diet previously. MD has adjusted tube feeds to infuse over 14 hours at rate of 45 ml/hr. RD to order Ensure and Prostat to aid in caloric and protein needs. RD to continue to monitor for tolerance.    Labs and medications reviewed.   Diet Order:   Diet Order            Diet full liquid Room service appropriate? Yes; Fluid consistency: Thin  Diet effective now              EDUCATION NEEDS:   Education needs have been addressed  Skin:  Skin Assessment: Reviewed RN Assessment  Last BM:  9/21  Height:   Ht Readings from Last 1 Encounters:  07/20/19 6\' 4"  (1.93 m)    Weight:   Wt Readings from Last 1 Encounters:  07/20/19 91.6 kg    Ideal Body Weight:  91.8 kg  BMI:  Body mass index is 24.59 kg/m.  Estimated Nutritional Needs:   Kcal:  [id number]  Protein:  120-145 grams  Fluid:   >2L/day    Corrin Parker, MS, RD, LDN Pager # [phone] After hours/ weekend pager # [phone]

## 2019-07-27 LAB — GLUCOSE, CAPILLARY
Glucose-Capillary: 124 mg/dL — ABNORMAL HIGH (ref 70–99)
Glucose-Capillary: 131 mg/dL — ABNORMAL HIGH (ref 70–99)
Glucose-Capillary: 147 mg/dL — ABNORMAL HIGH (ref 70–99)
Glucose-Capillary: 160 mg/dL — ABNORMAL HIGH (ref 70–99)
Glucose-Capillary: 193 mg/dL — ABNORMAL HIGH (ref 70–99)
Glucose-Capillary: 213 mg/dL — ABNORMAL HIGH (ref 70–99)

## 2019-07-27 LAB — BPAM RBC
Blood Product Expiration Date: 202010102359
Blood Product Expiration Date: 202010172359
Blood Product Expiration Date: 202010232359
Blood Product Expiration Date: 202010232359
ISSUE DATE / TIME: 202009170902
ISSUE DATE / TIME: 202009170902
Unit Type and Rh: 5100
Unit Type and Rh: 5100
Unit Type and Rh: 5100
Unit Type and Rh: 5100

## 2019-07-27 LAB — TYPE AND SCREEN
ABO/RH(D): O POS
Antibody Screen: NEGATIVE
Unit division: 0
Unit division: 0
Unit division: 0
Unit division: 0

## 2019-07-27 LAB — CBC
HCT: 24.9 % — ABNORMAL LOW (ref 39.0–52.0)
Hemoglobin: 8.2 g/dL — ABNORMAL LOW (ref 13.0–17.0)
MCH: 30.7 pg (ref 26.0–34.0)
MCHC: 32.9 g/dL (ref 30.0–36.0)
MCV: 93.3 fL (ref 80.0–100.0)
Platelets: 194 10*3/uL (ref 150–400)
RBC: 2.67 MIL/uL — ABNORMAL LOW (ref 4.22–5.81)
RDW: 14.7 % (ref 11.5–15.5)
WBC: 7.1 10*3/uL (ref 4.0–10.5)
nRBC: 0 % (ref 0.0–0.2)

## 2019-07-27 LAB — COMPREHENSIVE METABOLIC PANEL
ALT: 74 U/L — ABNORMAL HIGH (ref 0–44)
AST: 86 U/L — ABNORMAL HIGH (ref 15–41)
Albumin: 2.2 g/dL — ABNORMAL LOW (ref 3.5–5.0)
Alkaline Phosphatase: 380 U/L — ABNORMAL HIGH (ref 38–126)
Anion gap: 10 (ref 5–15)
BUN: 13 mg/dL (ref 6–20)
CO2: 23 mmol/L (ref 22–32)
Calcium: 8.1 mg/dL — ABNORMAL LOW (ref 8.9–10.3)
Chloride: 104 mmol/L (ref 98–111)
Creatinine, Ser: 1.07 mg/dL (ref 0.61–1.24)
GFR calc Af Amer: >60 mL/min (ref >60)
GFR calc non Af Amer: >60 mL/min (ref >60)
Glucose, Bld: 156 mg/dL — ABNORMAL HIGH (ref 70–99)
Potassium: 3.5 mmol/L (ref 3.5–5.1)
Sodium: 137 mmol/L (ref 135–145)
Total Bilirubin: 0.7 mg/dL (ref 0.3–1.2)
Total Protein: 5.3 g/dL — ABNORMAL LOW (ref 6.5–8.1)

## 2019-07-27 MED ORDER — ENOXAPARIN SODIUM 100 MG/ML ~~LOC~~ SOLN
90.0000 mg | Freq: Two times a day (BID) | SUBCUTANEOUS | Status: DC
  Administered 2019-07-27 – 2019-07-29 (×4): 90 mg via SUBCUTANEOUS
  Filled 2019-07-27 (×4): qty 1

## 2019-07-27 NOTE — Progress Notes (Signed)
Pharmacy Antibiotic Note  Brian Torres is a 58 y.o. male admitted on 07/20/2019 s/p Whipple started empiric antibiotics on 9/21. Culture data remains negative. Patient was febrile on one occurrence yesterday Tm 101.2. Day #4 abx.   Plan: -Vancomycin 1250 mg IV q12h **recommend discontinuing -Zosyn 3.375 g IV q8h -Monitor renal fx, cultures, vanc levels over the weekend if vancomycin continues  Height: 6\' 4"  (193 cm) Weight: 202 lb (91.6 kg) IBW/kg (Calculated) : 86.8  Temp (24hrs), Avg:99.6 F (37.6 C), Min:98.5 F (36.9 C), Max:101.2 F (38.4 C)  Recent Labs  Lab 07/22/19 0552 07/22/19 1730 07/23/19 0600 07/24/19 0516 07/25/19 0500 07/27/19 0127  WBC 7.2 5.4 7.0 7.9 7.1 7.1  CREATININE 0.95  --  0.70 1.15 1.08 1.07    Estimated Creatinine Clearance: 92.4 mL/min (by C-G formula based on SCr of 1.07 mg/dL).     Antimicrobials this admission: 9/21 vancomycin > 9/21 zosyn >  Dose adjustments this admission: N/A  Microbiology results: 9/21 blood cx: ngtd 9/17 mrsa pcr: neg  Brian Torres, Brian Torres 07/27/2019 10:40 AM

## 2019-07-27 NOTE — Progress Notes (Signed)
Physical Therapy Treatment Patient Details Name: Brian Torres MRN: [id number] DOB: 1961-01-10 Today's Date: 07/27/2019    History of Present Illness Patient is a 58 y/o male admitted with adenocarcinoma of the pancreatic head now s/p Diagnostic laparoscopy Classic pancreaticoduodenectomy  Placement of pancreatic duct stent 16 Fr jejunostomy feeding tube on 07/20/19.    PT Comments    Patient progressing with mobility and able to ambulated circle in hallway.  Ideally needs to do more than once per day.  Will address with nursing.  PT to follow acutely.    Follow Up Recommendations  Home health PT;Supervision/Assistance - 24 hour     Equipment Recommendations  Rolling walker with 5" wheels;3in1 (PT)    Recommendations for Other Services       Precautions / Restrictions Precautions Precautions: Fall Precaution Comments: RLQ drain x 2    Mobility  Bed Mobility         Supine to sit: HOB elevated;Supervision     General bed mobility comments: assist for lines  Transfers Overall transfer level: Needs assistance Equipment used: Rolling walker (2 wheeled) Transfers: Sit to/from Stand Sit to Stand: Min guard         General transfer comment: cues and assist for lines and stability  Ambulation/Gait Ambulation/Gait assistance: Min guard Gait Distance (Feet): 150 Feet Assistive device: Rolling walker (2 wheeled) Gait Pattern/deviations: Step-through pattern;Decreased stride length;Trunk flexed     General Gait Details: slow but steady except for LOB on turns min guard for safety   Stairs             Wheelchair Mobility    Modified Rankin (Stroke Patients Only)       Balance Overall balance assessment: Needs assistance Sitting-balance support: Feet supported Sitting balance-Leahy Scale: Good     Standing balance support: Bilateral upper extremity supported Standing balance-Leahy Scale: Poor Standing balance comment: UE support on walker  throughout                            Cognition Arousal/Alertness: Awake/alert Behavior During Therapy: WFL for tasks assessed/performed Overall Cognitive Status: Within Functional Limits for tasks assessed                                        Exercises      General Comments General comments (skin integrity, edema, etc.): wife in room and walked with [place]      Pertinent Vitals/Pain Pain Assessment: Faces Faces Pain Scale: Hurts little more Pain Location: abdomen with mobility Pain Descriptors / Indicators: Grimacing;Guarding Pain Intervention(s): Monitored during session    Home Living                      Prior Function            PT Goals (current goals can now be found in the care plan section) Progress towards PT goals: Progressing toward goals    Frequency           PT Plan Current plan remains appropriate    Co-evaluation              AM-PAC PT "6 Clicks" Mobility   Outcome Measure  Help needed turning from your back to your side while in a flat bed without using bedrails?: A Little Help needed moving from lying on your back to sitting  on the side of a flat bed without using bedrails?: None Help needed moving to and from a bed to a chair (including a wheelchair)?: A Little Help needed standing up from a chair using your arms (e.g., wheelchair or bedside chair)?: A Little Help needed to walk in hospital room?: A Little Help needed climbing 3-5 steps with a railing? : A Little 6 Click Score: 19    End of Session   Activity Tolerance: Patient tolerated treatment well Patient left: in chair;with call bell/phone within reach;with family/visitor present   PT Visit Diagnosis: Difficulty in walking, not elsewhere classified (R26.2)     Time: [id number] PT Time Calculation (min) (ACUTE ONLY): 20 min  Charges:  $Gait Training: 8-22 mins                     Magda Kiel, [place] [phone] 07/27/2019    Reginia Naas 07/27/2019, 12:29 PM

## 2019-07-27 NOTE — Progress Notes (Signed)
Nutrition Follow-up  DOCUMENTATION CODES:   Not applicable  INTERVENTION:  48 hour calorie count initiated.   Continue Ensure Enlive po TID, each supplement provides 350 kcal and 20 grams of protein.  May continue nutritional protein shakes from home.  Once able to restart tube feedings, Osmolite 1.5 formula via Jtube at rate of 60 ml/hr x 14 hours with 30 ml Prostat TID to provide 1560 kcal (65% of kcal needs), 98 grams of protein (81% of protein needs), 638 ml water.   NUTRITION DIAGNOSIS:   Increased nutrient needs related to cancer and cancer related treatments as evidenced by estimated needs; ongoing  GOAL:   Patient will meet greater than or equal to 90% of their needs; progressing  MONITOR:   Diet advancement, TF tolerance  REASON FOR ASSESSMENT:   Malnutrition Screening Tool, Consult Enteral/tube feeding initiation and management  ASSESSMENT:   Pt with PMH of dyslipidemia, smoker DM, and HTN who was dx with pancreatic cancer 12/2018 s/p ERCP, EUS, and 8 cycles of FOLFIRINOX pt now admitted for surgery. Pt s/p whipple 9/17.  Diet has been advanced to soft diet. Pt awaiting lunch meal during time of visit. Pt reports he did not consume breakfast this AM. No tube feeds were infusing during time of visit. Pt reports RN working on obtaining the correct TF parts to restart the feedings. Pt reports family has brought in protein shakes from home that pt prefers to consume to aid in caloric and protein needs. Pt may continue with home supplement shakes po. MD has increased tube feedings to infuse at rate of 60 ml/hr x 14 hours. Calorie count additionally ordered by MD. RD to follow up tomorrow with day 1 calorie count results.   Labs and medications reviewed.   Diet Order:   Diet Order            DIET SOFT Room service appropriate? Yes; Fluid consistency: Thin  Diet effective now              EDUCATION NEEDS:   Education needs have been addressed  Skin:  Skin  Assessment: Reviewed RN Assessment  Last BM:  9/23  Height:   Ht Readings from Last 1 Encounters:  07/20/19 6\' 4"  (1.93 m)    Weight:   Wt Readings from Last 1 Encounters:  07/20/19 91.6 kg    Ideal Body Weight:  91.8 kg  BMI:  Body mass index is 24.59 kg/m.  Estimated Nutritional Needs:   Kcal:  [id number]  Protein:  120-145 grams  Fluid:  >2L/day    Corrin Parker, MS, RD, LDN Pager # [phone] After hours/ weekend pager # [phone]

## 2019-07-27 NOTE — Progress Notes (Addendum)
7 Days Post-Op   Subjective/Chief Complaint: No n/v.  Passing gas.  Tolerating 14 hours of tube feeds.   Objective: Vital signs in last 24 hours: Temp:  [98.5 F (36.9 C)-101.2 F (38.4 C)] 99.5 F (37.5 C) (09/24 0402) Pulse Rate:  [84-101] 87 (09/24 0540) Resp:  [18-25] 22 (09/24 0540) BP: (135-158)/(69-85) 135/73 (09/24 0402) SpO2:  [94 %-98 %] 97 % (09/24 0540) Last BM Date: 07/24/19  Intake/Output from previous day: 09/23 0701 - 09/24 0700 In: 1934.1 [P.O.:500; I.V.:659.2; IV Piggyback:774.9] Out: 100 [Drains:100] Intake/Output this shift: No intake/output data recorded.  Constitutional: More  Lungs: Normal respiratory effort CV: regular rate and rhythm, no peripheral edema, pedal pulses 2+ GI: soft, approp tender.  Continues to be less distended.  Dressing c/d/i.   One drain murky, one serosang.   Skin: No rashes, palpation reveals normal turgor Psychiatric: appropriate judgment and insight, oriented to person, place, and time   Lab Results:  Recent Labs    07/25/19 0500 07/27/19 0127  WBC 7.1 7.1  HGB 8.4* 8.2*  HCT 25.3* 24.9*  PLT 144* 194   BMET Recent Labs    07/25/19 0500 07/27/19 0127  NA 138 137  K 4.0 3.5  CL 104 104  CO2 22 23  GLUCOSE 220* 156*  BUN 14 13  CREATININE 1.08 1.07  CALCIUM 8.0* 8.1*   PT/INR No results for input(s): LABPROT, INR in the last 72 hours. ABG No results for input(s): PHART, HCO3 in the last 72 hours.  Invalid input(s): PCO2, PO2  Studies/Results: No results found.  Anti-infectives: Anti-infectives (From admission, onward)   Start     Dose/Rate Route Frequency Ordered Stop   07/24/19 2200  vancomycin (VANCOCIN) 1,250 mg in sodium chloride 0.9 % 250 mL IVPB     1,250 mg 166.7 mL/hr over 90 Minutes Intravenous Every 12 hours 07/24/19 1027     07/24/19 1000  vancomycin (VANCOCIN) 1,750 mg in sodium chloride 0.9 % 500 mL IVPB     1,750 mg 250 mL/hr over 120 Minutes Intravenous  Once 07/24/19 0927 07/24/19  1158   07/24/19 1000  piperacillin-tazobactam (ZOSYN) IVPB 3.375 g     3.375 g 12.5 mL/hr over 240 Minutes Intravenous Every 8 hours 07/24/19 0927     07/20/19 2000  ceFAZolin (ANCEF) IVPB 2g/100 mL premix     2 g 200 mL/hr over 30 Minutes Intravenous Every 8 hours 07/20/19 1523 07/20/19 2122   07/20/19 0630  ceFAZolin (ANCEF) IVPB 2g/100 mL premix     2 g 200 mL/hr over 30 Minutes Intravenous On call to O.R. 07/20/19 0622 07/20/19 1203   07/20/19 0629  ceFAZolin (ANCEF) 2-4 GM/100ML-% IVPB    Note to Pharmacy: Nyoka Cowden   : cabinet override      07/20/19 0629 07/20/19 0758      Assessment/Plan: s/p Procedure(s): LAPAROSCOPY DIAGNOSTIC (N/A) WHIPPLE PROCEDURE (N/A) FEEDING TUBE (N/A)  Epidural and foley out.  PRN IV dilaudid and prn oxy.  Prn robaxin orally Path back- ypT2N0 with no treatment effect Tube feeds adjust to 60 ml/hr for 14 hours.    Fever- most likely from midline wound.  Also one drain is murky.  Dressing changes.    Soft diet  Lovenox for VTE ppx.  DM - lantus and SSI    LOS: 7 days    [name] 07/27/2019

## 2019-07-28 LAB — GLUCOSE, CAPILLARY
Glucose-Capillary: 161 mg/dL — ABNORMAL HIGH (ref 70–99)
Glucose-Capillary: 230 mg/dL — ABNORMAL HIGH (ref 70–99)
Glucose-Capillary: 274 mg/dL — ABNORMAL HIGH (ref 70–99)
Glucose-Capillary: 281 mg/dL — ABNORMAL HIGH (ref 70–99)
Glucose-Capillary: 289 mg/dL — ABNORMAL HIGH (ref 70–99)
Glucose-Capillary: 291 mg/dL — ABNORMAL HIGH (ref 70–99)

## 2019-07-28 MED ORDER — ONDANSETRON 4 MG PO TBDP: 4.0000 mg | ORAL_TABLET | Freq: Four times a day (QID) | ORAL | 0 refills | Status: DC | PRN

## 2019-07-28 MED ORDER — PANTOPRAZOLE SODIUM 40 MG PO TBEC
40.0000 mg | DELAYED_RELEASE_TABLET | Freq: Every day | ORAL | Status: DC
  Administered 2019-07-28 – 2019-07-29 (×2): 40 mg via ORAL
  Filled 2019-07-28 (×2): qty 1

## 2019-07-28 MED ORDER — OXYCODONE HCL 5 MG PO TABS: 5.0000 mg | ORAL_TABLET | ORAL | 0 refills | Status: DC | PRN

## 2019-07-28 MED ORDER — AMOXICILLIN-POT CLAVULANATE 875-125 MG PO TABS: 1.0000 | ORAL_TABLET | Freq: Two times a day (BID) | ORAL | 0 refills | Status: DC

## 2019-07-28 MED ORDER — ENSURE ENLIVE PO LIQD: 237.0000 mL | Freq: Three times a day (TID) | ORAL | 12 refills | Status: DC

## 2019-07-28 MED ORDER — PRO-STAT SUGAR FREE PO LIQD: 30.0000 mL | Freq: Three times a day (TID) | ORAL | 0 refills | Status: DC

## 2019-07-28 MED ORDER — METHOCARBAMOL 500 MG PO TABS: 500.0000 mg | ORAL_TABLET | Freq: Four times a day (QID) | ORAL | 0 refills | Status: AC | PRN

## 2019-07-28 MED ORDER — AMOXICILLIN-POT CLAVULANATE 875-125 MG PO TABS
1.0000 | ORAL_TABLET | Freq: Two times a day (BID) | ORAL | Status: DC
  Administered 2019-07-28 – 2019-07-29 (×3): 1 via ORAL
  Filled 2019-07-28 (×4): qty 1

## 2019-07-28 MED ORDER — OSMOLITE 1.5 CAL PO LIQD
630.0000 mL | ORAL | Status: DC
  Administered 2019-07-28: 21:00:00 630 mL
  Filled 2019-07-28 (×2): qty 711

## 2019-07-28 NOTE — Progress Notes (Signed)
Demographics, clinicals , [place] orders faxed to [facility] attention Tanisha  @ [phone], intake # [id number]. Whitman Hero RN,BSN,CM [phone]

## 2019-07-28 NOTE — Progress Notes (Signed)
Occupational Therapy Treatment Patient Details Name: Brian Torres MRN: [id number] DOB: [birth year] Today's Date: 07/28/2019    History of present illness Patient is a 58 y/o male admitted with adenocarcinoma of the pancreatic head now s/p Diagnostic laparoscopy Classic pancreaticoduodenectomy  Placement of pancreatic duct stent 16 Fr jejunostomy feeding tube on 07/20/19.   OT comments  Pt with improved pain, endurance, strength and standing balance. Able to dress with set up to supervision, toilet and groom with supervision and ambulated in hall around circle x 2 with RW. Pt is eager to go home. Wife is worried about her ability to care for pt, but has called in friends and family to help her.   Follow Up Recommendations  Home health OT    Equipment Recommendations  3 in 1 bedside commode    Recommendations for Other Services      Precautions / Restrictions Precautions Precautions: Fall Precaution Comments: RLQ drain x 1       Mobility Bed Mobility               General bed mobility comments: pt received in chair  Transfers Overall transfer level: Needs assistance Equipment used: Rolling walker (2 wheeled) Transfers: Sit to/from Stand Sit to Stand: Supervision         General transfer comment: for safety and lines, cues for hand placement    Balance Overall balance assessment: Needs assistance   Sitting balance-Leahy Scale: Good     Standing balance support: Bilateral upper extremity supported Standing balance-Leahy Scale: Fair                             ADL either performed or assessed with clinical judgement   ADL Overall ADL's : Needs assistance/impaired     Grooming: Wash/dry hands;Standing;Supervision/safety           Upper Body Dressing : Set up;Sitting   Lower Body Dressing: Supervision/safety;Sit to/from stand;Sitting/lateral leans   Toilet Transfer: Supervision/safety;Ambulation;RW;BSC   Toileting- Water quality scientist  and Hygiene: Supervision/safety;Sit to/from stand       Functional mobility during ADLs: Supervision/safety;Rolling walker       Vision       Perception     Praxis      Cognition Arousal/Alertness: Awake/alert Behavior During Therapy: WFL for tasks assessed/performed Overall Cognitive Status: Within Functional Limits for tasks assessed                                          Exercises     Shoulder Instructions       General Comments      Pertinent Vitals/ Pain       Pain Assessment: Faces Faces Pain Scale: Hurts a little bit Pain Location: abdomen with mobility Pain Intervention(s): Monitored during session  Home Living                                          Prior Functioning/Environment              Frequency  Min 2X/week        Progress Toward Goals  OT Goals(current goals can now be found in the care plan section)  Progress towards OT goals: Progressing toward goals  Acute Rehab OT Goals Patient  Stated Goal: to return to playing golf OT Goal Formulation: With patient Time For Goal Achievement: 08/09/19 Potential to Achieve Goals: Good  Plan Discharge plan remains appropriate    Co-evaluation                 AM-PAC OT "6 Clicks" Daily Activity     Outcome Measure   Help from another person eating meals?: None Help from another person taking care of personal grooming?: A Little Help from another person toileting, which includes using toliet, bedpan, or urinal?: A Little Help from another person bathing (including washing, rinsing, drying)?: A Little Help from another person to put on and taking off regular upper body clothing?: None Help from another person to put on and taking off regular lower body clothing?: A Little 6 Click Score: 20    End of Session Equipment Utilized During Treatment: Rolling walker  OT Visit Diagnosis: Unsteadiness on feet (R26.81);Other abnormalities of gait and  mobility (R26.89);Muscle weakness (generalized) (M62.81)   Activity Tolerance Patient tolerated treatment well   Patient Left in chair;with call bell/phone within reach;with family/visitor present   Nurse Communication          Time: [id number] OT Time Calculation (min): 30 min  Charges: OT General Charges $OT Visit: 1 Visit OT Treatments $Self Care/Home Management : 23-37 mins  Nestor Lewandowsky, OTR/L Acute Rehabilitation Services Pager: [phone] Office: [phone]   Malka So 07/28/2019, 3:51 PM

## 2019-07-28 NOTE — Discharge Instructions (Addendum)
[facility]      [facility] °[phone] ° °ABDOMINAL SURGERY: POST OP INSTRUCTIONS ° °Always review your discharge instruction sheet given to you by the facility where your surgery was performed. ° °IF YOU HAVE DISABILITY OR FAMILY LEAVE FORMS, YOU MUST BRING THEM TO THE OFFICE FOR PROCESSING.  PLEASE DO NOT GIVE THEM TO YOUR DOCTOR. ° °1. A prescription for pain medication may be given to you upon discharge.  Take your pain medication as prescribed, if needed.  If narcotic pain medicine is not needed, then you may take acetaminophen (Tylenol) or ibuprofen (Advil) as needed. °2. Take your usually prescribed medications unless otherwise directed. °3. If you need a refill on your pain medication, please contact your pharmacy. They will contact our office to request authorization.  Prescriptions will not be filled after 5pm or on week-ends. °4. You should follow a light diet the first few days after arrival home, such as soup and crackers, pudding, etc.unless your doctor has advised otherwise. A high-fiber, low fat diet can be resumed as tolerated.   Be sure to include lots of fluids daily. Most patients will experience some swelling and bruising on the chest and neck area.  Ice packs will help.  Swelling and bruising can take several days to resolve °5. Most patients will experience some swelling and bruising in the area of the incision. Ice pack will help. Swelling and bruising can take several days to resolve..  °6. It is common to experience some constipation if taking pain medication after surgery.  Increasing fluid intake and taking a stool softener will usually help or prevent this problem from occurring.  A mild laxative (Milk of Magnesia or Miralax) should be taken according to package directions if there are no bowel movements after 48 hours. °7.  You may have steri-strips (small skin tapes) in place directly over the incision.  These strips should be left on the skin for 10-14 days.  If your  surgeon used skin glue on the incision, you may shower in 48 hours.  The glue will flake off over the next 2-3 weeks.  Any sutures or staples will be removed at the office during your follow-up visit. You may find that a light gauze bandage over your incision may keep your staples from being rubbed or pulled. You may shower and replace the bandage daily. °8. ACTIVITIES:  You may resume regular (light) daily activities beginning the next day--such as daily self-care, walking, climbing stairs--gradually increasing activities as tolerated.  You may have sexual intercourse when it is comfortable.  Refrain from any heavy lifting or straining until approved by your doctor. °a. You may drive when you no longer are taking prescription pain medication, you can comfortably wear a seatbelt, and you can safely maneuver your car and apply brakes °b. Return to Work: __________8 weeks if applicable_________________________ °9. You should see your doctor in the office for a follow-up appointment approximately two weeks after your surgery.  Make sure that you call for this appointment within a day or two after you arrive home to insure a convenient appointment time. °OTHER INSTRUCTIONS:  °_____________________________________________________________ °_____________________________________________________________ ° °WHEN TO CALL YOUR DOCTOR: °1. Fever over 101.0 °2. Inability to urinate °3. Nausea and/or vomiting °4. Extreme swelling or bruising °5. Continued bleeding from incision. °6. Increased pain, redness, or drainage from the incision. °7. Difficulty swallowing or breathing °8. Muscle cramping or spasms. °9. Numbness or tingling in hands or feet or around lips. ° °The clinic staff is   available to answer your questions during regular business hours.  Please don’t hesitate to call and ask to speak to one of the nurses if you have concerns. ° °For further questions, please visit [email or web address] ° ° ° °Surgical Drain  Home Care °Surgical drains are used to remove extra fluid that normally builds up in a surgical wound after surgery. A surgical drain helps to heal a surgical wound. Different kinds of surgical drains include: °· Active drains. These drains use suction to pull drainage away from the surgical wound. Drainage flows through a tube to a container outside of the body. With these drains, you need to keep the bulb or the drainage container flat (compressed) at all times, except while you empty it. Flattening the bulb or container creates suction. °· Passive drains. These drains allow fluid to drain naturally, by gravity. Drainage flows through a tube to a bandage (dressing) or a container outside of the body. Passive drains do not need to be emptied. °A drain is placed during surgery. Right after surgery, drainage is usually bright red and a little thicker than water. The drainage may gradually turn yellow or pink and become thinner. It is likely that your health care provider will remove the drain when the drainage stops or when the amount decreases to 1-2 Tbsp (15-30 mL) during a 24-hour period. °Supplies needed: °· Tape. °· Germ-free cleaning solution (sterile saline). °· Cotton swabs. °· Split gauze drain sponge: 4 x 4 inches (10 x 10 cm). °· Gauze square: 4 x 4 inches (10 x 10 cm). °How to care for your surgical drain °Care for your drain as told by your health care provider. This is important to help prevent infection. If your drain is placed at your back, or any other hard-to-reach area, ask another person to assist you in performing the following tasks: °General care °· Keep the skin around the drain dry and covered with a dressing at all times. °· Check your drain area every day for signs of infection. Check for: °? Redness, swelling, or pain. °? Pus or a bad smell. °? Cloudy drainage. °? Tenderness or pressure at the drain exit site. °Changing the dressing °Follow instructions from your health care provider about  how to change your dressing. Change your dressing at least once a day. Change it more often if needed to keep the dressing dry. Make sure you: °1. Gather your supplies. °2. Wash your hands with soap and water before you change your dressing. If soap and water are not available, use hand sanitizer. °3. Remove the old dressing. Avoid using scissors to do that. °4. Wash your hands with soap and water again after removing the old dressing. °5. Use sterile saline to clean your skin around the drain. You may need to use a cotton swab to clean the skin. °6. Place the tube through the slit in a drain sponge. Place the drain sponge so that it covers your wound. °7. Place the gauze square or another drain sponge on top of the drain sponge that is on the wound. Make sure the tube is between those layers. °8. Tape the dressing to your skin. °9. Tape the drainage tube to your skin 1-2 inches (2.5-5 cm) below the place where the tube enters your body. Taping keeps the tube from pulling on any stitches (sutures) that you have. °10. Wash your hands with soap and water. °11. Write down the color of your drainage and how often you change your dressing. °How to empty   your active drain ° °1. Make sure that you have a measuring cup that you can empty your drainage into. °2. Wash your hands with soap and water. If soap and water are not available, use hand sanitizer. °3. Loosen any pins or clips that hold the tube in place. °4. If your health care provider tells you to strip the tube to prevent clots and tube blockages: °? Hold the tube at the skin with one hand. Use your other hand to pinch the tubing with your thumb and first finger. °? Gently move your fingers down the tube while squeezing very lightly. This clears any drainage, clots, or tissue from the tube. °? You may need to do this several times each day to keep the tube clear. Do not pull on the tube. °5. Open the bulb cap or the drain plug. Do not touch the inside of the cap or  the bottom of the plug. °6. Turn the device upside down and gently squeeze. °7. Empty all of the drainage into the measuring cup. °8. Compress the bulb or the container and replace the cap or the plug. To compress the bulb or the container, squeeze it firmly in the middle while you close the cap or plug the container. °9. Write down the amount of drainage that you have in each 24-hour period. If you have less than 2 Tbsp (30 mL) of drainage during 24 hours, contact your health care provider. °10. Flush the drainage down the toilet. °11. Wash your hands with soap and water. °Contact a health care provider if: °· You have redness, swelling, or pain around your drain area. °· You have pus or a bad smell coming from your drain area. °· You have a fever or chills. °· The skin around your drain is warm to the touch. °· The amount of drainage that you have is increasing instead of decreasing. °· You have drainage that is cloudy. °· There is a sudden stop or a sudden decrease in the amount of drainage that you have. °· Your drain tube falls out. °· Your active drain does not stay compressed after you empty it. °Summary °· Surgical drains are used to remove extra fluid that normally builds up in a surgical wound after surgery. °· Different kinds of surgical drains include active drains and passive drains. Active drains use suction to pull drainage away from the surgical wound, and passive drains allow fluid to drain naturally. °· It is important to care for your drain to prevent infection. If your drain is placed at your back, or any other hard-to-reach area, ask another person to assist you. °· Contact your health care provider if you have redness, swelling, or pain around your drain area. °This information is not intended to replace advice given to you by your health care provider. Make sure you discuss any questions you have with your health care provider. °Document Released: 10/16/2000 Document Revised: 11/23/2018  Document Reviewed: 11/23/2018 °Elsevier Patient Education © 2020 [employer] ° °

## 2019-07-28 NOTE — Progress Notes (Signed)
8 Days Post-Op   Subjective/Chief Complaint: Doing well.  Tolerating tube feeds 60 ml/hr for 14 hours per day.  Calorie counts in progress.    Objective: Vital signs in last 24 hours: Temp:  [98.1 F (36.7 C)-99.2 F (37.3 C)] 98.5 F (36.9 C) (09/25 0832) Pulse Rate:  [82-90] 86 (09/25 0832) Resp:  [19-26] 21 (09/25 0832) BP: (133-144)/(65-76) 133/65 (09/25 0832) SpO2:  [95 %-99 %] 99 % (09/25 0832) Last BM Date: 07/26/19  Intake/Output from previous day: 09/24 0701 - 09/25 0700 In: 280.5 [IV Piggyback:280.5] Out: 6 [Drains:50] Intake/Output this shift: No intake/output data recorded.  Constitutional: More  Lungs: Normal respiratory effort CV: regular rate and rhythm, no peripheral edema GI: soft, approp tender.  Continues to be less distended.  Dressing c/d/i.   One drain murky, one serosang.   Skin: No rashes, palpation reveals normal turgor Psychiatric: appropriate judgment and insight, oriented to person, place, and time   Lab Results:  Recent Labs    07/27/19 0127  WBC 7.1  HGB 8.2*  HCT 24.9*  PLT 194   BMET Recent Labs    07/27/19 0127  NA 137  K 3.5  CL 104  CO2 23  GLUCOSE 156*  BUN 13  CREATININE 1.07  CALCIUM 8.1*   PT/INR No results for input(s): LABPROT, INR in the last 72 hours. ABG No results for input(s): PHART, HCO3 in the last 72 hours.  Invalid input(s): PCO2, PO2  Studies/Results: No results found.  Anti-infectives: Anti-infectives (From admission, onward)   Start     Dose/Rate Route Frequency Ordered Stop   07/24/19 2200  vancomycin (VANCOCIN) 1,250 mg in sodium chloride 0.9 % 250 mL IVPB     1,250 mg 166.7 mL/hr over 90 Minutes Intravenous Every 12 hours 07/24/19 1027     07/24/19 1000  vancomycin (VANCOCIN) 1,750 mg in sodium chloride 0.9 % 500 mL IVPB     1,750 mg 250 mL/hr over 120 Minutes Intravenous  Once 07/24/19 0927 07/24/19 1158   07/24/19 1000  piperacillin-tazobactam (ZOSYN) IVPB 3.375 g     3.375 g 12.5  mL/hr over 240 Minutes Intravenous Every 8 hours 07/24/19 0927     07/20/19 2000  ceFAZolin (ANCEF) IVPB 2g/100 mL premix     2 g 200 mL/hr over 30 Minutes Intravenous Every 8 hours 07/20/19 1523 07/20/19 2122   07/20/19 0630  ceFAZolin (ANCEF) IVPB 2g/100 mL premix     2 g 200 mL/hr over 30 Minutes Intravenous On call to O.R. 07/20/19 0622 07/20/19 1203   07/20/19 0629  ceFAZolin (ANCEF) 2-4 GM/100ML-% IVPB    Note to Pharmacy: Nyoka Cowden   : cabinet override      07/20/19 0629 07/20/19 0758      Assessment/Plan: s/p Procedure(s): LAPAROSCOPY DIAGNOSTIC (N/A) WHIPPLE PROCEDURE (N/A) FEEDING TUBE (N/A)  Epidural and foley out.  PRN IV dilaudid and prn oxy.  Prn robaxin orally Path back- ypT2N0 with no treatment effect Tube feeds adjust to 60 ml/hr for 14 hours.    Fever- resolved.  most likely from midline wound.  Also one drain is murky.  Dressing changes.    Soft diet Transition to oral antibiotics. Calorie counts.  Hopefully home tomorrow.    Lovenox for VTE ppx.  DM - lantus and SSI    LOS: 8 days    [name] 07/28/2019

## 2019-07-28 NOTE — Progress Notes (Addendum)
Calorie Count Note  48 hour calorie count ordered.  Diet: Soft diet with thin liquids.  Supplements:   Ensure Enlive po TID, each supplement provides 350 kcal and 20 grams of protein  Nutritional protein shakes from home (Core power shakes, provides 240 kcal and 26 grams of protein)  Breakfast: 569 kcal, 10 grams of protein Lunch: 141 kcal, 3 grams of protein Dinner: 77 kcal, 5 grams of protein Supplements: 660 kcal, 47 grams of protein  Day 1 Total intake: 1447 kcal (60% of minimum kcal needs)  65 grams of protein (54% of minimum protein needs)  Estimated Nutritional Needs:  Kcal:  2400-2700 Protein:  120-145 grams Fluid:  >2L/day  Per MD, possible plans for discharge home tomorrow. Pt has been refusing nocturnal tube feeds. Pt agreeable to tube feeds if nutrition intake <75% of estimated needs. Plans to infuse tube feeds this evening as calorie count over the past 24 hours only met 60% of kcal needs and 54% of protein needs. RN made aware. Pt reports po intake is gradually improving. Pt encouraged to eat his food at meals and to drink his supplements.   Nutrition Dx:  Increased nutrient needs related to cancer and cancer related treatments as evidenced by estimated needs; ongoing  Goal:  Pt to meet >/= 90% of their estimated nutrition needs; progressing  Intervention:   Continue Ensure Enlive po TID, each supplement provides 350 kcal and 20 grams of protein.   May continue nutritional protein shakes from home (Core power shakes, provides 240 kcal and 26 grams of protein).   Provide Magic cup TID with meals, each supplement provides 290 kcal and 9 grams of protein   Provide nocturnal tube feeds if pt does not meet 75% of nutrition needs  Osmolite 1.5 formula via J-tube at rate of 60 ml/hr x 14 hours (e.g. 6pm-8am)  30 ml Prostat (or equivalent) TID per tube.  Nocturnal tube feeding regimen to provide 1560 kcal (65% of kcal needs), 98 grams of protein (81% of protein  needs), 638 ml water.   Brian Parker, MS, RD, LDN Pager # [phone] After hours/ weekend pager # [phone]

## 2019-07-28 NOTE — TOC Initial Note (Addendum)
Transition of Care (TOC) - Initial/Assessment Note  Marvetta Gibbons RN, BSN Transitions of Care Unit 4NP coverage- RN Case Manager [phone]   Patient Details  Name: Brian Torres MRN: [id number] Date of Birth: 1961/09/08  Transition of Care [facility]) CM/SW Contact:    Dawayne Patricia, RN Phone Number: 07/28/2019, 1:18 PM  Clinical Narrative:                 Pt s/p [place], referral received for [facility] needs RN/PT and TF- awaiting final recommendations for home TF needs. CM spoke with pt and wife at bedside- pt very aware of insurance process for [facility] with [facility]- pt has used [facility] in past for [facility] and DME needs. Discussed transition needs for this time requesting 3n1 for home. Orders for RN and PT placed- Call made to [facility] for [facility] referral- spoke with Linna Caprice- orders and notes faxed into [facility] via epic to [phone]- planning of d/c on 9/26 with [facility] on 9/27- [facility] will reach out to [facility] agencies to secure needed services and then notify pt/wife which agency has been secured. CM will f/u with [facility] for 3n1 and tube feed needs.   [facility]- referral intake #- [id number]  Expected Discharge Plan: [place] Barriers to Discharge: Continued Medical Work up   Patient Goals and CMS Choice Patient states their goals for this hospitalization and ongoing recovery are:: to return home CMS Medicare.gov Compare Post Acute Care list provided to:: Patient Choice offered to / list presented to : Patient  Expected Discharge Plan and Services Expected Discharge Plan: [place]   Discharge Planning Services: CM Consult Post Acute Care Choice: Home Health, Durable Medical Equipment                   DME Arranged: 3-N-1, Tube feeding DME Agency: [facility]       HH Arranged: RN, PT     Time HH Agency Contacted: ([facility])    Prior Living Arrangements/Services   Lives with:: Self, Spouse Patient language and need for  interpreter reviewed:: Yes Do you feel safe going back to the place where you live?: Yes      Need for Family Participation in Patient Care: Yes (Comment) Care giver support system in place?: Yes (comment) Current home services: DME Criminal Activity/Legal Involvement Pertinent to Current Situation/Hospitalization: No - Comment as needed  Activities of Daily Living Home Assistive Devices/Equipment: Eyeglasses ADL Screening (condition at time of admission) Patient's cognitive ability adequate to safely complete daily activities?: Yes Is the patient deaf or have difficulty hearing?: No Does the patient have difficulty seeing, even when wearing glasses/contacts?: No Does the patient have difficulty concentrating, remembering, or making decisions?: No Patient able to express need for assistance with ADLs?: Yes Does the patient have difficulty dressing or bathing?: No Independently performs ADLs?: Yes (appropriate for developmental age) Does the patient have difficulty walking or climbing stairs?: Yes Weakness of Legs: None Weakness of Arms/Hands: None  Permission Sought/Granted Permission sought to share information with : Chartered certified accountant granted to share information with : Yes, Verbal Permission Granted     Permission granted to share info w AGENCY: [facility]        Emotional Assessment Appearance:: Appears stated age Attitude/Demeanor/Rapport: Engaged Affect (typically observed): Appropriate, Pleasant Orientation: : Oriented to Self, Oriented to Place, Oriented to  Time, Oriented to Situation   Psych Involvement: No (comment)  Admission diagnosis:  Adenocarcinoma of  head of pancreas [facility]) [C25.0] Patient Active Problem List   Diagnosis Date Noted  . Pancreatic cancer ([place]) 07/20/2019  . 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 Genetic testing 05/16/2019  . Family history of melanoma   . Family history of breast cancer   .  Port-A-Cath in place 02/20/2019  . Cancer of head of pancreas ([place]) 01/26/2019  . Pancreatic mass 12/19/2018  . Tobacco abuse 12/19/2018  . Elevated LFTs 12/19/2018  . Hyponatremia 12/19/2018  . Normocytic anemia 10/19/2017  . Thrombocytopenia ([place]) 10/19/2017  . Leg DVT (deep venous thromboembolism), acute, left ([place]) 10/15/2017  . DM2 (diabetes mellitus, type 2) ([place]) 10/15/2017  . HTN (hypertension) 10/15/2017  . CKD stage 3 due to type 2 diabetes mellitus ([place]) 10/15/2017   PCP:  Cari Caraway, MD Pharmacy:   [facility] [address], [place] [address] Phone: [phone] Fax: [phone]     Social Determinants of Health (SDOH) Interventions    Readmission Risk Interventions No flowsheet data found.

## 2019-07-28 NOTE — Progress Notes (Addendum)
NCM made referral with [facility] for home DME tube feeding. Tube feeding will be delivered to pt's home pending approval..Marland KitchenNCM to f/u. Whitman Hero RN,BSN,CM [phone]

## 2019-07-29 LAB — CULTURE, BLOOD (ROUTINE X 2)
Culture: NO GROWTH
Culture: NO GROWTH
Special Requests: ADEQUATE
Special Requests: ADEQUATE

## 2019-07-29 LAB — GLUCOSE, CAPILLARY
Glucose-Capillary: 296 mg/dL — ABNORMAL HIGH (ref 70–99)
Glucose-Capillary: 223 mg/dL — ABNORMAL HIGH (ref 70–99)
Glucose-Capillary: 360 mg/dL — ABNORMAL HIGH (ref 70–99)
Glucose-Capillary: 283 mg/dL — ABNORMAL HIGH (ref 70–99)

## 2019-07-29 MED ORDER — HEPARIN SOD (PORK) LOCK FLUSH 100 UNIT/ML IV SOLN
500.0000 [IU] | INTRAVENOUS | Status: AC | PRN
  Administered 2019-07-29: 16:00:00 500 [IU]
  Filled 2019-07-29: qty 5

## 2019-07-29 NOTE — Progress Notes (Signed)
Nsg Discharge Note  Admit Date:  07/20/2019 Discharge date: 07/29/2019   Brian Torres to be D/C'd home with home health per MD order.  AVS completed. Patient/caregiver able to verbalize understanding.  Discharge Medication: Allergies as of 07/29/2019   No Known Allergies     Medication List    TAKE these medications   acetaminophen 500 MG tablet Commonly known as: TYLENOL Take 1,000 mg by mouth every 6 (six) hours as needed for moderate pain or headache.   amoxicillin-clavulanate 875-125 MG tablet Commonly known as: AUGMENTIN Take 1 tablet by mouth every 12 (twelve) hours.   apixaban 5 MG Tabs tablet Commonly known as: ELIQUIS Take 1 tablet (5 mg total) by mouth 2 (two) times daily.   b complex vitamins tablet Take 1 tablet by mouth daily.   Basaglar KwikPen 100 UNIT/ML Sopn Inject 19 Units into the skin at bedtime.   CINNAMON PO Take 1,000 mg by mouth 2 (two) times daily.   COQ-10 PO Take 1 tablet by mouth daily.   feeding supplement (ENSURE ENLIVE) Liqd Take 237 mLs by mouth 3 (three) times daily between meals.   feeding supplement (PRO-STAT SUGAR FREE 64) Liqd Place 30 mLs into feeding tube 3 (three) times daily.   ferrous sulfate 325 (65 FE) MG EC tablet Take 325 mg by mouth 2 (two) times a day.   Fish Oil 1000 MG Caps Take 1,000 mg by mouth 2 (two) times daily.   glimepiride 4 MG tablet Commonly known as: AMARYL Take 4 mg by mouth daily with breakfast.   HumaLOG KwikPen 100 UNIT/ML KwikPen Generic drug: insulin lispro Inject 3-13 Units into the skin 2 (two) times daily as needed (if bs is over 150).   lidocaine-prilocaine cream Commonly known as: EMLA Apply 1 application topically as needed. What changed: reasons to take this   lisinopril 10 MG tablet Commonly known as: ZESTRIL Take 10 mg by mouth daily.   LORazepam 0.5 MG tablet Commonly known as: ATIVAN Take 1 tablet (0.5 mg total) by mouth every 8 (eight) hours as needed (for nausea).    magic mouthwash Soln Take 5 mLs by mouth 4 (four) times daily as needed for mouth pain. Swish and spit   metFORMIN 1000 MG tablet Commonly known as: GLUCOPHAGE Take 1,000 mg by mouth 2 (two) times daily.   methocarbamol 500 MG tablet Commonly known as: ROBAXIN Take 1 tablet (500 mg total) by mouth every 6 (six) hours as needed for muscle spasms.   ondansetron 4 MG disintegrating tablet Commonly known as: ZOFRAN-ODT Take 1 tablet (4 mg total) by mouth every 6 (six) hours as needed for nausea.   ondansetron 8 MG tablet Commonly known as: ZOFRAN Take 1 tablet (8 mg total) by mouth every 8 (eight) hours as needed for nausea or vomiting. Begin 72 hours after IV chemo   oxyCODONE 5 MG immediate release tablet Commonly known as: Oxy IR/ROXICODONE Take 1-2 tablets (5-10 mg total) by mouth every 4 (four) hours as needed for moderate pain, severe pain or breakthrough pain.   Pepcid AC 10 MG tablet Generic drug: famotidine Take 10 mg by mouth 2 (two) times daily as needed for heartburn or indigestion.   pioglitazone 45 MG tablet Commonly known as: ACTOS Take 45 mg by mouth daily.   prochlorperazine 10 MG tablet Commonly known as: COMPAZINE Take 1 tablet (10 mg total) by mouth every 6 (six) hours as needed for nausea or vomiting.  Durable Medical Equipment  (From admission, onward)         Start     Ordered   07/28/19 1554  For home use only DME Tube feeding  Once    Comments: Osmolite 1.5 formula via J-tube at rate of 60 ml/hr x 14 hours (e.g. 6pm-8am)  30 ml Prostat (or equivalent) TID per tube.  Nocturnal tube feeding regimen to provide 1560 kcal (65% of kcal needs), 98 grams of protein (81% of protein needs), 638 ml water.    07/28/19 1555   07/28/19 1323  For home use only DME 3 n 1  Once     07/28/19 1322          Discharge Assessment: Vitals:   07/29/19 1210 07/29/19 1631  BP: (!) 150/78 (!) 164/79  Pulse: 89 (!) 102  Resp: 16 15  Temp: 98.9  F (37.2 C) 99 F (37.2 C)  SpO2: 98% 100%   Skin clean, dry and intact without evidence of skin break down, no evidence of skin tears noted. IV catheter discontinued intact. Site without signs and symptoms of complications - no redness or edema noted at insertion site, patient denies c/o pain - only slight tenderness at site.  Dressing with slight pressure applied.  D/c Instructions-Education: Discharge instructions given to patient/family with verbalized understanding. D/c education completed with patient/family including follow up instructions, medication list, d/c activities limitations if indicated, with other d/c instructions as indicated by MD - patient able to verbalize understanding, all questions fully answered. Patient instructed to return to ED, call 911, or call MD for any changes in condition.  Patient escorted via [place], and D/C home via private auto.  Atilano Ina, RN 07/29/2019 5:48 PM

## 2019-07-29 NOTE — Care Management (Signed)
[facility] left VM:   Home health is arranged with [facility].  To start visits Monday 07/31/2019.  Family will need to be taught to manage tube feeding until RN visit on Monday.

## 2019-07-29 NOTE — Progress Notes (Addendum)
Calorie Count Note  RD working remotely.  48 hour calorie count ordered.  Diet: soft  Supplements: Ensure Enlive po TID, each supplement provides 350 kcal and 20 grams of protein;  Nutritional protein shakes from home (Core power shakes, provides 240 kcal and 26 grams of protein); Magic cup TID with meals, each supplement provides 290 kcal and 9 grams of protein  Received paged from RN (Amy) regarding pt. She reports that pt's intake has improved since yesterday and has been consuming 100% of meals (both breakfast and lunch). Pt is very eager to go home and would like to d/c nocturnal tube feeding, if possible. Per discussion with RN, MD is requesting RD re-evaluate this pt prior to discharge to determine if pt is able to meet nutritional needs via PO's alone.   Spoke with pt over phone, who reports that his intake has improved significantly over the past 24 hours. Pt reports that previous calorie count data was not a good reflection of his PO intake, due to the first two meals recorded was when he was just advanced to a PO diet and was very apprehensive about eating. Since then, pt reports he has been eating 100% of meals over the past 24 hours (expect for his peas at dinner). Pt does not like Magic Cup supplements, but is willing to drink Ensure supplements (which he he refused today) and Fairlife supplements at home (pt estimates consuming 1-2 per day). Educated pt on importance of adequate of oral intake as well as oral nutrition supplements, as nutritional needs are increased post-operatively. He reports he believes he will also eat better at home, as he has more access to foods her enjoys. Pt wife is extremely supportive and dedicated to pt to ensure he received proper nutrition.   Educated pt on importance of consuming 3 oral nutrition supplements per day at home, if he desires to discontinue nocturnal tube feedings. Pt reports "My wife and I will do whatever we need to do, so I can go home and  heal. Home is the best place for me". Offered to discuss plan of care with pt wife over the phone. Pt wife expressed understanding of care plan, but did not have any questions at this time. Both expressed appreciation of RD.   Called back Amy, RN, and discussed above recommendations and findings.   Day 1 Breakfast: 569 kcal, 10 grams of protein Lunch: 141 kcal, 3 grams of protein Dinner: 77 kcal, 5 grams of protein Supplements: 660 kcal, 47 grams of protein  Day 1 Total intake: 1447 kcal (60% of minimum kcal needs)  65 grams of protein (54% of minimum protein needs)  Day 2 Breakfast: 600 kcals, 30 grams protein Lunch: 600 kcals, 30 grams protein Dinner: 540 kcals, 27 grams Supplements: 340 kcals, 52 grams protein  Total intake: 2080 kcal (87% of minimum estimated needs)  139 protein (100% of minimum estimated needs)  Nutrition Dx: Increased nutrient needsrelated to cancer and cancer related treatmentsas evidenced by estimated needs; ongoing  Goal: Pt to meet >/= 90% of their estimated nutrition needs; progressing  Intervention:   -Continue Ensure Enlive po TID, each supplement provides 350 kcal and 20 grams of protein -May continue nutritional protein shakes from home (Core power shakes, provides 240 kcal and 26 grams of protein) -Provided reinforcement education on high calorie, high protein diet with meals and supplements.   Brian Torres A. Jimmye Norman, RD, LDN, [place] Registered Dietitian II Certified Diabetes Care and Education Specialist Pager: [phone] After hours Pager:  [phone]  

## 2019-07-29 NOTE — Plan of Care (Signed)
Patient to discharge home with [facility] including PT and RN.

## 2019-07-29 NOTE — Progress Notes (Signed)
9 Days Post-Op   Subjective/Chief Complaint: No complaints  Wants to go home  Tolerating po. Wants to consider stopping tube feeds.   Objective: Vital signs in last 24 hours: Temp:  [98.1 F (36.7 C)-99.4 F (37.4 C)] 98.9 F (37.2 C) (09/26 0801) Pulse Rate:  [88-99] 88 (09/26 0801) Resp:  [15-27] 18 (09/26 0325) BP: (145-154)/(71-79) 150/77 (09/26 0800) SpO2:  [97 %-100 %] 97 % (09/26 0801) Last BM Date: 07/26/19  Intake/Output from previous day: 09/25 0701 - 09/26 0700 In: 240 [P.O.:240] Out: -  Intake/Output this shift: Total I/O In: 118 [P.O.:118] Out: -   Exam: Awake and alert Abdomen soft, drain output stable Incision clean   Lab Results:  Recent Labs    07/27/19 0127  WBC 7.1  HGB 8.2*  HCT 24.9*  PLT 194   BMET Recent Labs    07/27/19 0127  NA 137  K 3.5  CL 104  CO2 23  GLUCOSE 156*  BUN 13  CREATININE 1.07  CALCIUM 8.1*   PT/INR No results for input(s): LABPROT, INR in the last 72 hours. ABG No results for input(s): PHART, HCO3 in the last 72 hours.  Invalid input(s): PCO2, PO2  Studies/Results: No results found.  Anti-infectives: Anti-infectives (From admission, onward)   Start     Dose/Rate Route Frequency Ordered Stop   07/28/19 1000  amoxicillin-clavulanate (AUGMENTIN) 875-125 MG per tablet 1 tablet     1 tablet Oral Every 12 hours 07/28/19 0903 08/11/19 0959   07/28/19 0000  amoxicillin-clavulanate (AUGMENTIN) 875-125 MG tablet     1 tablet Oral Every 12 hours 07/28/19 1309     07/24/19 2200  vancomycin (VANCOCIN) 1,250 mg in sodium chloride 0.9 % 250 mL IVPB  Status:  Discontinued     1,250 mg 166.7 mL/hr over 90 Minutes Intravenous Every 12 hours 07/24/19 1027 07/28/19 0918   07/24/19 1000  vancomycin (VANCOCIN) 1,750 mg in sodium chloride 0.9 % 500 mL IVPB     1,750 mg 250 mL/hr over 120 Minutes Intravenous  Once 07/24/19 0927 07/24/19 1158   07/24/19 1000  piperacillin-tazobactam (ZOSYN) IVPB 3.375 g  Status:   Discontinued     3.375 g 12.5 mL/hr over 240 Minutes Intravenous Every 8 hours 07/24/19 0927 07/28/19 0918   07/20/19 2000  ceFAZolin (ANCEF) IVPB 2g/100 mL premix     2 g 200 mL/hr over 30 Minutes Intravenous Every 8 hours 07/20/19 1523 07/20/19 2122   07/20/19 0630  ceFAZolin (ANCEF) IVPB 2g/100 mL premix     2 g 200 mL/hr over 30 Minutes Intravenous On call to O.R. 07/20/19 0622 07/20/19 1203   07/20/19 0629  ceFAZolin (ANCEF) 2-4 GM/100ML-% IVPB    Note to Pharmacy: Nyoka Cowden   : cabinet override      07/20/19 0629 07/20/19 0758      Assessment/Plan: s/p Procedure(s): LAPAROSCOPY DIAGNOSTIC (N/A) WHIPPLE PROCEDURE (N/A) FEEDING TUBE (N/A)  He wants to go home today. Will have to make sure all home health arrangements have been made including tube feeds. I think nutrition/dietien needs to see him again today prior to discharge as he wants to potentially no do tube feeds at home   LOS: 9 days    Coralie Keens 07/29/2019

## 2019-07-31 NOTE — Progress Notes (Signed)
Received VM from [facility] regarding patient requesting hospital bed post discharge. Call made to [facility], spoke with Caryl Pina regarding request. As pt has already been discharged request will need to go through MD office- [facility] has Phone # on file for Dr. Barry Dienes- call made while CM was on hold to Dr. Marlowe Aschoff office for DME request- per Caryl Pina MD office agreeable to hospital bed and will work on getting order and notes to [facility]. [facility] will f/u on order for hospital bed with MD office and work to see if bed approved. If approved [facility] will reach out to pt regarding contracted company. CM called pt with update regarding his request and reminded pt that he could call and provided reference # to [facility] if he wanted to follow up on request.

## 2019-08-01 NOTE — Anesthesia Postprocedure Evaluation (Signed)
Anesthesia Post Note  Patient: Brian Torres  Procedure(s) Performed: LAPAROSCOPY DIAGNOSTIC (N/A Abdomen) WHIPPLE PROCEDURE (N/A Abdomen) FEEDING TUBE (N/A Abdomen)     Patient location during evaluation: PACU Anesthesia Type: Regional Level of consciousness: awake Pain management: pain level controlled Respiratory status: spontaneous breathing Cardiovascular status: stable Postop Assessment: no apparent nausea or vomiting Anesthetic complications: no    Last Vitals:  Vitals:   07/29/19 1210 07/29/19 1631  BP: (!) 150/78 (!) 164/79  Pulse: 89 (!) 102  Resp: 16 15  Temp: 37.2 C 37.2 C  SpO2: 98% 100%    Last Pain:  Vitals:   07/29/19 1631  TempSrc: Oral  PainSc:                  Ailis Rigaud

## 2019-08-02 NOTE — Discharge Summary (Signed)
Physician Discharge Summary  Patient ID: Brian Torres MRN: [id number] DOB/AGE: Dec 20, 1960 58 y.o.  Admit date: 07/20/2019 Discharge date: 07/29/2019  Admission Diagnoses: Patient Active Problem List   Diagnosis Date Noted  . Pancreatic cancer ([place]) 07/20/2019  . Adenocarcinoma of head of pancreas ([place]) 07/20/2019  . Genetic testing 05/16/2019  . Family history of melanoma   . Family history of breast cancer   . Port-A-Cath in place 02/20/2019  . Cancer of head of pancreas ([place]) 01/26/2019  . Pancreatic mass 12/19/2018  . Tobacco abuse 12/19/2018  . Elevated LFTs 12/19/2018  . Hyponatremia 12/19/2018  . Normocytic anemia 10/19/2017  . Thrombocytopenia ([place]) 10/19/2017  . Leg DVT (deep venous thromboembolism), acute, left ([place]) 10/15/2017  . DM2 (diabetes mellitus, type 2) ([place]) 10/15/2017  . HTN (hypertension) 10/15/2017  . CKD stage 3 due to type 2 diabetes mellitus ([place]) 10/15/2017    Discharge Diagnoses:  Active Problems:   Pancreatic cancer ([place])   Adenocarcinoma of head of pancreas ([place]) and same as above.  Discharged Condition: stable  Hospital Course:  Pt was admitted to the ICU following a pancreaticoduodenectomy with J tube for adenocarcinoma of the pancreatic head 07/20/2019.  His intraoperative course and early post op course was complicated by hyperkalemia which required insulin and D50.  His blood sugars required some adjustment of his insulin.  He was started on tube feeds.  He had some fevers and he developed wound infection.  Drain lipase was normal, but one drain became murky and was left in place.  He was able to tolerate transition to oral pain meds.  Once wound was opened, his fevers resolved.  He received IV antibiotics for several days and was transitioned to oral antibiotics upon discharge.  He was transitioned to night feeds and did well with eating.  He was discharged home without tube feeds on POD 11.    Consults: anesthesia  Significant Diagnostic  Studies: labs: prior to d/c, HCT 156, ALk phos 380, WBCs 7.1, HCT 24.9.    Treatments: surgery: see above  Discharge Exam: Blood pressure (!) 164/79, pulse (!) 102, temperature 99 F (37.2 C), temperature source Oral, resp. rate 15, height '6\' 4"'$  (1.93 m), weight 91.6 kg, SpO2 100 %. General appearance: alert and oriented. CV regular  Pulm - breathing comfortably GI soft, non tender, non distended.  One drain murky.  Ext warm.   Disposition:   Discharge Instructions    Call MD for:   Complete by: As directed    Temperature >101   Call MD for:  hives   Complete by: As directed    Call MD for:  persistant dizziness or light-headedness   Complete by: As directed    Call MD for:  persistant nausea and vomiting   Complete by: As directed    Call MD for:  redness, tenderness, or signs of infection (pain, swelling, redness, odor or green/yellow discharge around incision site)   Complete by: As directed    Call MD for:  severe uncontrolled pain   Complete by: As directed    Diet Carb Modified   Complete by: As directed    Discharge instructions   Complete by: As directed    See [facility] discharge instructions   Increase activity slowly   Complete by: As directed      Allergies as of 07/29/2019   No Known Allergies     Medication List    TAKE these medications   acetaminophen 500 MG tablet Commonly known as:  TYLENOL Take 1,000 mg by mouth every 6 (six) hours as needed for moderate pain or headache.   amoxicillin-clavulanate 875-125 MG tablet Commonly known as: AUGMENTIN Take 1 tablet by mouth every 12 (twelve) hours.   apixaban 5 MG Tabs tablet Commonly known as: ELIQUIS Take 1 tablet (5 mg total) by mouth 2 (two) times daily.   b complex vitamins tablet Take 1 tablet by mouth daily.   Basaglar KwikPen 100 UNIT/ML Sopn Inject 19 Units into the skin at bedtime.   CINNAMON PO Take 1,000 mg by mouth 2 (two) times daily.   COQ-10 PO Take 1 tablet by mouth daily.    feeding supplement (ENSURE ENLIVE) Liqd Take 237 mLs by mouth 3 (three) times daily between meals.   feeding supplement (PRO-STAT SUGAR FREE 64) Liqd Place 30 mLs into feeding tube 3 (three) times daily.   ferrous sulfate 325 (65 FE) MG EC tablet Take 325 mg by mouth 2 (two) times a day.   Fish Oil 1000 MG Caps Take 1,000 mg by mouth 2 (two) times daily.   glimepiride 4 MG tablet Commonly known as: AMARYL Take 4 mg by mouth daily with breakfast.   HumaLOG KwikPen 100 UNIT/ML KwikPen Generic drug: insulin lispro Inject 3-13 Units into the skin 2 (two) times daily as needed (if bs is over 150).   lidocaine-prilocaine cream Commonly known as: EMLA Apply 1 application topically as needed. What changed: reasons to take this   lisinopril 10 MG tablet Commonly known as: ZESTRIL Take 10 mg by mouth daily.   LORazepam 0.5 MG tablet Commonly known as: ATIVAN Take 1 tablet (0.5 mg total) by mouth every 8 (eight) hours as needed (for nausea).   magic mouthwash Soln Take 5 mLs by mouth 4 (four) times daily as needed for mouth pain. Swish and spit   metFORMIN 1000 MG tablet Commonly known as: GLUCOPHAGE Take 1,000 mg by mouth 2 (two) times daily.   methocarbamol 500 MG tablet Commonly known as: ROBAXIN Take 1 tablet (500 mg total) by mouth every 6 (six) hours as needed for muscle spasms.   ondansetron 4 MG disintegrating tablet Commonly known as: ZOFRAN-ODT Take 1 tablet (4 mg total) by mouth every 6 (six) hours as needed for nausea.   ondansetron 8 MG tablet Commonly known as: ZOFRAN Take 1 tablet (8 mg total) by mouth every 8 (eight) hours as needed for nausea or vomiting. Begin 72 hours after IV chemo   oxyCODONE 5 MG immediate release tablet Commonly known as: Oxy IR/ROXICODONE Take 1-2 tablets (5-10 mg total) by mouth every 4 (four) hours as needed for moderate pain, severe pain or breakthrough pain.   Pepcid AC 10 MG tablet Generic drug: famotidine Take 10 mg by  mouth 2 (two) times daily as needed for heartburn or indigestion.   pioglitazone 45 MG tablet Commonly known as: ACTOS Take 45 mg by mouth daily.   prochlorperazine 10 MG tablet Commonly known as: COMPAZINE Take 1 tablet (10 mg total) by mouth every 6 (six) hours as needed for nausea or vomiting.      Follow-up Information    [name], MD Follow up on 08/11/2019.   Specialty: General Surgery Contact information: [place] [address] [phone]        [facility] Follow up.   Why: referral made for [facility] needs RN/PT- intake reference #- [id number]- they will call you with name of contracted agency once secured.  Contact information: [phone]  Signed: [name] 08/02/2019, 3:00 PM

## 2019-08-07 ENCOUNTER — Other Ambulatory Visit: Payer: Self-pay

## 2019-08-07 ENCOUNTER — Emergency Department (HOSPITAL_COMMUNITY): Payer: Managed Care, Other (non HMO)

## 2019-08-07 ENCOUNTER — Inpatient Hospital Stay (HOSPITAL_COMMUNITY)
Admission: EM | Admit: 2019-08-07 | Discharge: 2019-08-11 | DRG: 862 | Disposition: A | Discharge status: Home / Self Care | Payer: Managed Care, Other (non HMO) | Attending: Student | Admitting: Student

## 2019-08-07 DIAGNOSIS — Z823 Family history of stroke: Secondary | ICD-10-CM

## 2019-08-07 DIAGNOSIS — Z8249 Family history of ischemic heart disease and other diseases of the circulatory system: Secondary | ICD-10-CM

## 2019-08-07 DIAGNOSIS — Z808 Family history of malignant neoplasm of other organs or systems: Secondary | ICD-10-CM

## 2019-08-07 DIAGNOSIS — A4189 Other specified sepsis: Secondary | ICD-10-CM | POA: Diagnosis present

## 2019-08-07 DIAGNOSIS — D638 Anemia in other chronic diseases classified elsewhere: Secondary | ICD-10-CM | POA: Diagnosis present

## 2019-08-07 DIAGNOSIS — Z803 Family history of malignant neoplasm of breast: Secondary | ICD-10-CM

## 2019-08-07 DIAGNOSIS — Z794 Long term (current) use of insulin: Secondary | ICD-10-CM

## 2019-08-07 DIAGNOSIS — Z79899 Other long term (current) drug therapy: Secondary | ICD-10-CM | POA: Diagnosis not present

## 2019-08-07 DIAGNOSIS — D696 Thrombocytopenia, unspecified: Secondary | ICD-10-CM | POA: Diagnosis present

## 2019-08-07 DIAGNOSIS — I10 Essential (primary) hypertension: Secondary | ICD-10-CM | POA: Diagnosis present

## 2019-08-07 DIAGNOSIS — K529 Noninfective gastroenteritis and colitis, unspecified: Secondary | ICD-10-CM | POA: Diagnosis present

## 2019-08-07 DIAGNOSIS — E1165 Type 2 diabetes mellitus with hyperglycemia: Secondary | ICD-10-CM | POA: Diagnosis present

## 2019-08-07 DIAGNOSIS — F17210 Nicotine dependence, cigarettes, uncomplicated: Secondary | ICD-10-CM | POA: Diagnosis present

## 2019-08-07 DIAGNOSIS — T8140XA Infection following a procedure, unspecified, initial encounter: Secondary | ICD-10-CM | POA: Diagnosis present

## 2019-08-07 DIAGNOSIS — C250 Malignant neoplasm of head of pancreas: Secondary | ICD-10-CM | POA: Diagnosis present

## 2019-08-07 DIAGNOSIS — E876 Hypokalemia: Secondary | ICD-10-CM | POA: Diagnosis present

## 2019-08-07 DIAGNOSIS — N1831 Chronic kidney disease, stage 3a: Secondary | ICD-10-CM | POA: Diagnosis not present

## 2019-08-07 DIAGNOSIS — E785 Hyperlipidemia, unspecified: Secondary | ICD-10-CM | POA: Diagnosis present

## 2019-08-07 DIAGNOSIS — D649 Anemia, unspecified: Secondary | ICD-10-CM

## 2019-08-07 DIAGNOSIS — E872 Acidosis: Secondary | ICD-10-CM | POA: Diagnosis present

## 2019-08-07 DIAGNOSIS — T8143XA Infection following a procedure, organ and space surgical site, initial encounter: Secondary | ICD-10-CM | POA: Diagnosis present

## 2019-08-07 DIAGNOSIS — A419 Sepsis, unspecified organism: Secondary | ICD-10-CM | POA: Diagnosis not present

## 2019-08-07 DIAGNOSIS — E119 Type 2 diabetes mellitus without complications: Secondary | ICD-10-CM

## 2019-08-07 DIAGNOSIS — Z20828 Contact with and (suspected) exposure to other viral communicable diseases: Secondary | ICD-10-CM | POA: Diagnosis present

## 2019-08-07 DIAGNOSIS — E1121 Type 2 diabetes mellitus with diabetic nephropathy: Secondary | ICD-10-CM | POA: Diagnosis not present

## 2019-08-07 DIAGNOSIS — K651 Peritoneal abscess: Secondary | ICD-10-CM | POA: Diagnosis present

## 2019-08-07 DIAGNOSIS — Z86718 Personal history of other venous thrombosis and embolism: Secondary | ICD-10-CM

## 2019-08-07 DIAGNOSIS — B3749 Other urogenital candidiasis: Secondary | ICD-10-CM | POA: Diagnosis not present

## 2019-08-07 DIAGNOSIS — E871 Hypo-osmolality and hyponatremia: Secondary | ICD-10-CM | POA: Diagnosis present

## 2019-08-07 DIAGNOSIS — X58XXXA Exposure to other specified factors, initial encounter: Secondary | ICD-10-CM | POA: Diagnosis present

## 2019-08-07 DIAGNOSIS — T8144XA Sepsis following a procedure, initial encounter: Secondary | ICD-10-CM | POA: Diagnosis present

## 2019-08-07 DIAGNOSIS — Z931 Gastrostomy status: Secondary | ICD-10-CM

## 2019-08-07 DIAGNOSIS — Z7901 Long term (current) use of anticoagulants: Secondary | ICD-10-CM | POA: Diagnosis not present

## 2019-08-07 DIAGNOSIS — Z833 Family history of diabetes mellitus: Secondary | ICD-10-CM

## 2019-08-07 DIAGNOSIS — Z825 Family history of asthma and other chronic lower respiratory diseases: Secondary | ICD-10-CM

## 2019-08-07 DIAGNOSIS — Z90411 Acquired partial absence of pancreas: Secondary | ICD-10-CM

## 2019-08-07 LAB — SARS CORONAVIRUS 2 BY RT PCR (HOSPITAL ORDER, PERFORMED IN ~~LOC~~ HOSPITAL LAB): SARS Coronavirus 2: NEGATIVE

## 2019-08-07 LAB — CBC WITH DIFFERENTIAL/PLATELET
Abs Immature Granulocytes: 0.04 10*3/uL (ref 0.00–0.07)
Basophils Absolute: 0 10*3/uL (ref 0.0–0.1)
Basophils Relative: 0 %
Eosinophils Absolute: 0 10*3/uL (ref 0.0–0.5)
Eosinophils Relative: 0 %
HCT: 27.9 % — ABNORMAL LOW (ref 39.0–52.0)
Hemoglobin: 9.1 g/dL — ABNORMAL LOW (ref 13.0–17.0)
Immature Granulocytes: 1 %
Lymphocytes Relative: 2 %
Lymphs Abs: 0.1 10*3/uL — ABNORMAL LOW (ref 0.7–4.0)
MCH: 30.1 pg (ref 26.0–34.0)
MCHC: 32.6 g/dL (ref 30.0–36.0)
MCV: 92.4 fL (ref 80.0–100.0)
Monocytes Absolute: 0.1 10*3/uL (ref 0.1–1.0)
Monocytes Relative: 1 %
Neutro Abs: 6.5 10*3/uL (ref 1.7–7.7)
Neutrophils Relative %: 96 %
Platelets: 193 10*3/uL (ref 150–400)
RBC: 3.02 MIL/uL — ABNORMAL LOW (ref 4.22–5.81)
RDW: 14.5 % (ref 11.5–15.5)
WBC: 6.7 10*3/uL (ref 4.0–10.5)
nRBC: 0 % (ref 0.0–0.2)

## 2019-08-07 LAB — MAGNESIUM: Magnesium: 1.2 mg/dL — ABNORMAL LOW (ref 1.7–2.4)

## 2019-08-07 LAB — COMPREHENSIVE METABOLIC PANEL
ALT: 23 U/L (ref 0–44)
AST: 27 U/L (ref 15–41)
Albumin: 2.6 g/dL — ABNORMAL LOW (ref 3.5–5.0)
Alkaline Phosphatase: 262 U/L — ABNORMAL HIGH (ref 38–126)
Anion gap: 10 (ref 5–15)
BUN: 23 mg/dL — ABNORMAL HIGH (ref 6–20)
CO2: 21 mmol/L — ABNORMAL LOW (ref 22–32)
Calcium: 7.9 mg/dL — ABNORMAL LOW (ref 8.9–10.3)
Chloride: 100 mmol/L (ref 98–111)
Creatinine, Ser: 1.24 mg/dL (ref 0.61–1.24)
GFR calc Af Amer: >60 mL/min (ref >60)
GFR calc non Af Amer: >60 mL/min (ref >60)
Glucose, Bld: 175 mg/dL — ABNORMAL HIGH (ref 70–99)
Potassium: 3.3 mmol/L — ABNORMAL LOW (ref 3.5–5.1)
Sodium: 131 mmol/L — ABNORMAL LOW (ref 135–145)
Total Bilirubin: 0.7 mg/dL (ref 0.3–1.2)
Total Protein: 6.3 g/dL — ABNORMAL LOW (ref 6.5–8.1)

## 2019-08-07 LAB — LACTIC ACID, PLASMA
Lactic Acid, Venous: 3 mmol/L (ref 0.5–1.9)
Lactic Acid, Venous: 1.6 mmol/L (ref 0.5–1.9)

## 2019-08-07 LAB — CBG MONITORING, ED: Glucose-Capillary: 177 mg/dL — ABNORMAL HIGH (ref 70–99)

## 2019-08-07 LAB — LIPASE, BLOOD: Lipase: 15 U/L (ref 11–51)

## 2019-08-07 LAB — D-DIMER, QUANTITATIVE: D-Dimer, Quant: 18.14 ug/mL-FEU — ABNORMAL HIGH (ref 0.00–0.50)

## 2019-08-07 LAB — GLUCOSE, CAPILLARY: Glucose-Capillary: 185 mg/dL — ABNORMAL HIGH (ref 70–99)

## 2019-08-07 MED ORDER — IOHEXOL 350 MG/ML SOLN
100.0000 mL | Freq: Once | INTRAVENOUS | Status: AC | PRN
  Administered 2019-08-07: 17:00:00 100 mL via INTRAVENOUS

## 2019-08-07 MED ORDER — INSULIN ASPART 100 UNIT/ML ~~LOC~~ SOLN
0.0000 [IU] | SUBCUTANEOUS | Status: DC
  Administered 2019-08-07 – 2019-08-08 (×3): 2 [IU] via SUBCUTANEOUS
  Administered 2019-08-08: 01:00:00 3 [IU] via SUBCUTANEOUS
  Administered 2019-08-08: 2 [IU] via SUBCUTANEOUS
  Administered 2019-08-09: 1 [IU] via SUBCUTANEOUS
  Administered 2019-08-09: 20:00:00 2 [IU] via SUBCUTANEOUS
  Administered 2019-08-09 (×2): 1 [IU] via SUBCUTANEOUS
  Administered 2019-08-10: 20:00:00 3 [IU] via SUBCUTANEOUS
  Administered 2019-08-10: 14:00:00 2 [IU] via SUBCUTANEOUS
  Administered 2019-08-10: 16:00:00 3 [IU] via SUBCUTANEOUS
  Administered 2019-08-11: 01:00:00 2 [IU] via SUBCUTANEOUS

## 2019-08-07 MED ORDER — ACETAMINOPHEN 650 MG RE SUPP: 650.0000 mg | Freq: Four times a day (QID) | RECTAL | Status: DC | PRN

## 2019-08-07 MED ORDER — ONDANSETRON HCL 4 MG PO TABS: 4.0000 mg | ORAL_TABLET | Freq: Four times a day (QID) | ORAL | Status: DC | PRN

## 2019-08-07 MED ORDER — SODIUM CHLORIDE 0.9 % IV SOLN
INTRAVENOUS | Status: AC
  Administered 2019-08-07: 22:00:00 via INTRAVENOUS

## 2019-08-07 MED ORDER — IBUPROFEN 200 MG PO TABS
600.0000 mg | ORAL_TABLET | Freq: Once | ORAL | Status: AC
  Administered 2019-08-07: 18:00:00 600 mg via ORAL
  Filled 2019-08-07: qty 3

## 2019-08-07 MED ORDER — VANCOMYCIN HCL 10 G IV SOLR
2000.0000 mg | Freq: Once | INTRAVENOUS | Status: AC
  Administered 2019-08-07: 18:00:00 2000 mg via INTRAVENOUS
  Filled 2019-08-07: qty 2000

## 2019-08-07 MED ORDER — SODIUM CHLORIDE 0.9 % IV SOLN
2.0000 g | Freq: Once | INTRAVENOUS | Status: AC
  Administered 2019-08-07: 16:00:00 2 g via INTRAVENOUS
  Filled 2019-08-07: qty 2

## 2019-08-07 MED ORDER — POTASSIUM CHLORIDE 20 MEQ/15ML (10%) PO SOLN
60.0000 meq | Freq: Once | ORAL | Status: AC
  Administered 2019-08-07: 23:00:00 60 meq via ORAL
  Filled 2019-08-07: qty 45

## 2019-08-07 MED ORDER — SODIUM CHLORIDE 0.9 % IV BOLUS
1000.0000 mL | Freq: Once | INTRAVENOUS | Status: AC
  Administered 2019-08-07: 16:00:00 1000 mL via INTRAVENOUS

## 2019-08-07 MED ORDER — ACETAMINOPHEN 500 MG PO TABS
1000.0000 mg | ORAL_TABLET | Freq: Once | ORAL | Status: AC
  Administered 2019-08-07: 16:00:00 1000 mg via ORAL
  Filled 2019-08-07: qty 2

## 2019-08-07 MED ORDER — SODIUM CHLORIDE 0.9 % IV SOLN
INTRAVENOUS | Status: DC | PRN
  Administered 2019-08-07: 17:00:00 1000 mL via INTRAVENOUS

## 2019-08-07 MED ORDER — INSULIN GLARGINE 100 UNIT/ML ~~LOC~~ SOLN
8.0000 [IU] | Freq: Every day | SUBCUTANEOUS | Status: DC
  Administered 2019-08-07 – 2019-08-10 (×4): 8 [IU] via SUBCUTANEOUS
  Filled 2019-08-07 (×7): qty 0.08

## 2019-08-07 MED ORDER — NICOTINE 7 MG/24HR TD PT24
7.0000 mg | MEDICATED_PATCH | Freq: Once | TRANSDERMAL | Status: AC
  Administered 2019-08-07: 18:00:00 7 mg via TRANSDERMAL
  Filled 2019-08-07: qty 1

## 2019-08-07 MED ORDER — PIPERACILLIN-TAZOBACTAM 3.375 G IVPB
3.3750 g | Freq: Three times a day (TID) | INTRAVENOUS | Status: DC
  Administered 2019-08-07 – 2019-08-11 (×11): 3.375 g via INTRAVENOUS
  Filled 2019-08-07 (×11): qty 50

## 2019-08-07 MED ORDER — DIPHENHYDRAMINE HCL 25 MG PO CAPS
25.0000 mg | ORAL_CAPSULE | Freq: Once | ORAL | Status: AC | PRN
  Administered 2019-08-08: 25 mg via ORAL
  Filled 2019-08-07: qty 1

## 2019-08-07 MED ORDER — ACETAMINOPHEN 325 MG PO TABS
650.0000 mg | ORAL_TABLET | Freq: Four times a day (QID) | ORAL | Status: DC | PRN
  Administered 2019-08-08 – 2019-08-11 (×6): 650 mg via ORAL
  Filled 2019-08-07 (×6): qty 2

## 2019-08-07 MED ORDER — ONDANSETRON HCL 4 MG/2ML IJ SOLN
4.0000 mg | Freq: Once | INTRAMUSCULAR | Status: AC
  Administered 2019-08-07: 16:00:00 4 mg via INTRAVENOUS
  Filled 2019-08-07: qty 2

## 2019-08-07 MED ORDER — METRONIDAZOLE IN NACL 5-0.79 MG/ML-% IV SOLN
500.0000 mg | Freq: Once | INTRAVENOUS | Status: AC
  Administered 2019-08-07: 17:00:00 500 mg via INTRAVENOUS
  Filled 2019-08-07: qty 100

## 2019-08-07 MED ORDER — FERROUS SULFATE 325 (65 FE) MG PO TABS
325.0000 mg | ORAL_TABLET | Freq: Two times a day (BID) | ORAL | Status: DC
  Administered 2019-08-08 – 2019-08-11 (×5): 325 mg via ORAL
  Filled 2019-08-07 (×5): qty 1

## 2019-08-07 MED ORDER — MAGNESIUM SULFATE 2 GM/50ML IV SOLN
2.0000 g | Freq: Once | INTRAVENOUS | Status: AC
  Administered 2019-08-08: 2 g via INTRAVENOUS
  Filled 2019-08-07: qty 50

## 2019-08-07 MED ORDER — ONDANSETRON HCL 4 MG/2ML IJ SOLN: 4.0000 mg | Freq: Four times a day (QID) | INTRAMUSCULAR | Status: DC | PRN

## 2019-08-07 MED ORDER — SODIUM CHLORIDE 0.9 % IV BOLUS
500.0000 mL | Freq: Once | INTRAVENOUS | Status: AC
  Administered 2019-08-07: 23:00:00 500 mL via INTRAVENOUS

## 2019-08-07 MED ORDER — SODIUM CHLORIDE 0.9 % IV BOLUS
1000.0000 mL | Freq: Once | INTRAVENOUS | Status: AC
  Administered 2019-08-07: 18:00:00 1000 mL via INTRAVENOUS

## 2019-08-07 MED ORDER — ENSURE ENLIVE PO LIQD
237.0000 mL | Freq: Three times a day (TID) | ORAL | Status: DC
  Administered 2019-08-08 – 2019-08-10 (×2): 237 mL via ORAL

## 2019-08-07 MED ORDER — VANCOMYCIN HCL IN DEXTROSE 1-5 GM/200ML-% IV SOLN: 1000.0000 mg | Freq: Once | INTRAVENOUS | Status: DC

## 2019-08-07 MED ORDER — CHLORHEXIDINE GLUCONATE CLOTH 2 % EX PADS
6.0000 | MEDICATED_PAD | Freq: Every day | CUTANEOUS | Status: DC
  Administered 2019-08-07 – 2019-08-10 (×3): 6 via TOPICAL

## 2019-08-07 MED ORDER — SODIUM CHLORIDE (PF) 0.9 % IJ SOLN
INTRAMUSCULAR | Status: AC
  Filled 2019-08-07: qty 50

## 2019-08-07 MED ORDER — SODIUM CHLORIDE 0.9 % IV BOLUS (SEPSIS)
1000.0000 mL | Freq: Once | INTRAVENOUS | Status: AC
  Administered 2019-08-07: 1000 mL via INTRAVENOUS

## 2019-08-07 MED ORDER — SODIUM CHLORIDE 0.9% FLUSH
3.0000 mL | Freq: Two times a day (BID) | INTRAVENOUS | Status: DC
  Administered 2019-08-07 – 2019-08-10 (×7): 3 mL via INTRAVENOUS

## 2019-08-07 NOTE — ED Triage Notes (Signed)
Pt BIBA from home.   Per EMS- Pt had whipple procedure last month 9/17, pt got infection shortly after procedure. Pt had witnessed syncopal episode today appx 1305. Pt assisted to ground by family.  Pt does not take blood thinners. Pt lost bowel function during syncopal episode. Pt denies injury. Pt AOx4 on arrival to ED.      VS- 99.2 tympanic 142/66 130 HR 95% RA 248 CBG  300 mL NS PTA- 18 g L FA.

## 2019-08-07 NOTE — ED Provider Notes (Signed)
[facility] DEPT Provider Note   CSN: [id number] Arrival date & time: 08/07/19  1430     History   Chief Complaint Chief Complaint  Patient presents with   Loss of Consciousness   Nausea    HPI Brian Torres is a 58 y.o. male.     HPI  Pt is a 58 y/o male with a h/o pancreatic CA, diabetes, HLD, HTN, who presents to the ED today for eval of syncopal episode. States that for the last 3-4 days he has had nausea, vomiting, and some diarrhea. Today he states that he vomited a few times and then had a witnessed syncopal episode. He states that his family members caught him before hitting the ground. Prior to the episode he felt lightheaded and tremulous. He denies preceding chest pain or shortness of breath and he denies any currently. He denies any documented fevers at home. Denies hematemesis, bloody stools, abd pain, dysuria, frequency, urgency.   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 He denies any missed doses.   Reviewed records, pt had recent Whipple procedure with Dr. Barry Dienes Sept 17. He states he has been feeling great since the surgery other than the recent NV that started a few days ago. He states he is currently on amoxicillin because he had some infection around his stables. Denies any significant drainage from the wound to his abdomen.   Past Medical History:  Diagnosis Date   Cancer ([place]) 12/2018   pancreatic cancer   Depression    Diabetes mellitus without complication ([place])    type 2   Dyslipidemia    Erectile dysfunction    Family history of breast cancer    Family history of melanoma    Hypertension    white coat syndrome   Nicotine addiction     Patient Active Problem List   Diagnosis Date Noted   Postprocedural intraabdominal abscess 08/07/2019   Sepsis ([place]) 08/07/2019   Hypokalemia 08/07/2019   History of deep vein thrombosis (DVT) of lower extremity 08/07/2019   Pancreatic cancer ([place])  07/20/2019   Adenocarcinoma of head of pancreas ([place]) 07/20/2019   Genetic testing 05/16/2019   Family history of melanoma    Family history of breast cancer    Port-A-Cath in place 02/20/2019   Cancer of head of pancreas ([place]) 01/26/2019   Pancreatic mass 12/19/2018   Tobacco abuse 12/19/2018   Elevated LFTs 12/19/2018   Hyponatremia 12/19/2018   Anemia 10/19/2017   Thrombocytopenia ([place]) 10/19/2017   Leg DVT (deep venous thromboembolism), acute, left ([place]) 10/15/2017   DM2 (diabetes mellitus, type 2) ([place]) 10/15/2017   HTN (hypertension) 10/15/2017   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    Past Surgical History:  Procedure Laterality Date   BILIARY STENT PLACEMENT N/A 12/21/2018   Procedure: BILIARY STENT PLACEMENT;  Surgeon: Clarene Essex, MD;  [facility];  Service: Endoscopy;  Laterality: N/A;   BILIARY STENT PLACEMENT N/A 01/24/2019   Procedure: BILIARY STENT PLACEMENT;  Surgeon: Arta Silence, MD;  [facility];  Service: Endoscopy;  Laterality: N/A;   BIOPSY  12/21/2018   Procedure: BIOPSY;  Surgeon: Clarene Essex, MD;  [facility];  Service: Endoscopy;;   ENDOSCOPIC RETROGRADE CHOLANGIOPANCREATOGRAPHY (ERCP) WITH PROPOFOL N/A 12/21/2018   Procedure: ENDOSCOPIC RETROGRADE CHOLANGIOPANCREATOGRAPHY (ERCP) WITH PROPOFOL;  Surgeon: Clarene Essex, MD;  [facility];  Service: Endoscopy;  Laterality: N/A;   ENDOSCOPIC RETROGRADE CHOLANGIOPANCREATOGRAPHY (ERCP) WITH PROPOFOL N/A 01/24/2019  Procedure: ENDOSCOPIC RETROGRADE CHOLANGIOPANCREATOGRAPHY (ERCP) WITH PROPOFOL;  Surgeon: Arta Silence, MD;  [facility];  Service: Endoscopy;  Laterality: N/A;  Balloon Sweep of Duct   ESOPHAGOGASTRODUODENOSCOPY N/A 01/03/2019   Procedure: ESOPHAGOGASTRODUODENOSCOPY (EGD);  Surgeon: Arta Silence, MD;  Location: [name];  Service: Endoscopy;  Laterality: N/A;   ESOPHAGOGASTRODUODENOSCOPY (EGD)  WITH PROPOFOL N/A 01/24/2019   Procedure: ESOPHAGOGASTRODUODENOSCOPY (EGD) WITH PROPOFOL;  Surgeon: Arta Silence, MD;  [facility];  Service: Endoscopy;  Laterality: N/A;  stent removal   EUS N/A 01/03/2019   Procedure: FULL UPPER ENDOSCOPIC ULTRASOUND (EUS) Linear not radial;  Surgeon: Arta Silence, MD;  [facility];  Service: Endoscopy;  Laterality: N/A;   EUS N/A 01/24/2019   Procedure: FULL UPPER ENDOSCOPIC ULTRASOUND (EUS) RADIAL;  Surgeon: Arta Silence, MD;  [facility];  Service: Endoscopy;  Laterality: N/A;   FINE NEEDLE ASPIRATION N/A 01/03/2019   Procedure: FINE NEEDLE ASPIRATION (FNA) LINEAR;  Surgeon: Arta Silence, MD;  [facility];  Service: Endoscopy;  Laterality: N/A;   FINE NEEDLE ASPIRATION N/A 01/24/2019   Procedure: FINE NEEDLE ASPIRATION (FNA) LINEAR;  Surgeon: Arta Silence, MD;  [facility];  Service: Endoscopy;  Laterality: N/A;   LAPAROSCOPY N/A 07/20/2019   Procedure: LAPAROSCOPY DIAGNOSTIC;  Surgeon: [name], MD;  Location: [facility];  Service: General;  Laterality: N/A;   LOWER EXTREMITY ANGIOGRAPHY N/A 10/18/2017   Procedure: LOWER EXTREMITY ANGIOGRAPHY - IVUS - LYSIS CATH;  Surgeon: Waynetta Sandy, MD;  Location: [facility];  Service: Cardiovascular;  Laterality: N/A;   LOWER EXTREMITY ANGIOGRAPHY N/A 10/19/2017   Procedure: LOWER EXTREMITY ANGIOGRAPHY - LYSIS RECHECK;  Surgeon: Waynetta Sandy, MD;  Location: [facility];  Service: Cardiovascular;  Laterality: N/A;   PANCREATIC STENT PLACEMENT  12/21/2018   Procedure: PANCREATIC STENT PLACEMENT;  Surgeon: Clarene Essex, MD;  [facility];  Service: Endoscopy;;   PEG PLACEMENT N/A 07/20/2019   Procedure: FEEDING TUBE;  Surgeon: [name], MD;  Location: [facility];  Service: General;  Laterality: N/A;   PERIPHERAL VASCULAR BALLOON ANGIOPLASTY Left 10/19/2017   Procedure: PERIPHERAL VASCULAR BALLOON  ANGIOPLASTY;  Surgeon: Waynetta Sandy, MD;  Location: [facility];  Service: Cardiovascular;  Laterality: Left;  Multiple Lower extremity venous sites   PERIPHERAL VASCULAR INTERVENTION Left 10/18/2017   Procedure: PERIPHERAL VASCULAR INTERVENTION;  Surgeon: Waynetta Sandy, MD;  Location: [facility];  Service: Cardiovascular;  Laterality: Left;   PORTACATH PLACEMENT Left 02/02/2019   Procedure: INSERTION PORT-A-CATH WITH POSSIBLE ULTRASOUND;  Surgeon: [name], MD;  Location: [facility];  Service: General;  Laterality: Left;   REMOVAL OF STONES  12/21/2018   Procedure: REMOVAL OF STONES;  Surgeon: Clarene Essex, MD;  [facility];  Service: Endoscopy;;   SPHINCTEROTOMY  12/21/2018   Procedure: Joan Mayans;  Surgeon: Clarene Essex, MD;  [facility];  Service: Endoscopy;;   STENT REMOVAL  01/24/2019   Procedure: STENT REMOVAL;  Surgeon: Arta Silence, MD;  [facility];  Service: Endoscopy;;   WHIPPLE PROCEDURE N/A 07/20/2019   Procedure: WHIPPLE PROCEDURE;  Surgeon: [name], MD;  Location: [facility];  Service: General;  Laterality: N/A;        Home Medications    Prior to Admission medications   Medication Sig Start Date End Date Taking? Authorizing Provider  acetaminophen (TYLENOL) 500 MG tablet Take 1,000 mg by mouth every 6 (six) hours as needed for moderate pain or headache.  Yes [provider]  Amino Acids-Protein Hydrolys (FEEDING SUPPLEMENT, PRO-STAT SUGAR FREE 64,) LIQD Place 30 mLs into feeding tube 3 (three) times daily. 07/28/19  Yes [name], MD  amoxicillin-clavulanate (AUGMENTIN) 875-125 MG tablet Take 1 tablet by mouth every 12 (twelve) hours. 07/28/19  Yes [name], MD  apixaban (ELIQUIS) 5 MG TABS tablet Take 1 tablet (5 mg total) by mouth 2 (two) times daily. 01/04/19  Yes Arta Silence, MD  b complex vitamins tablet Take 1 tablet by mouth daily.   Yes [provider]  CINNAMON PO Take 1,000 mg by mouth 2 (two) times daily.   Yes [provider]  Coenzyme Q10 (COQ-10 PO) Take 1 tablet by mouth daily.   Yes [provider]  famotidine (PEPCID AC) 10 MG tablet Take 10 mg by mouth 2 (two) times daily as needed for heartburn or indigestion.   Yes [provider]  feeding supplement, ENSURE ENLIVE, (ENSURE ENLIVE) LIQD Take 237 mLs by mouth 3 (three) times daily between meals. 07/28/19  Yes [name], MD  ferrous sulfate 325 (65 FE) MG EC tablet Take 325 mg by mouth 2 (two) times a day.    Yes [provider]  glimepiride (AMARYL) 4 MG tablet Take 4 mg by mouth daily with breakfast.  10/13/17  Yes [provider]  HUMALOG KWIKPEN 100 UNIT/ML KwikPen Inject 3-13 Units into the skin 2 (two) times daily as needed (if bs is over 150).  06/29/19  Yes [provider]  Insulin Glargine (BASAGLAR KWIKPEN) 100 UNIT/ML SOPN Inject 19 Units into the skin at bedtime.  10/19/18  Yes [provider]  lisinopril (PRINIVIL,ZESTRIL) 10 MG tablet Take 10 mg by mouth daily. 10/13/17  Yes [provider]  LORazepam (ATIVAN) 0.5 MG tablet Take 1 tablet (0.5 mg total) by mouth every 8 (eight) hours as needed (for nausea). 05/01/19  Yes Ladell Pier, MD  metFORMIN (GLUCOPHAGE) 1000 MG tablet Take 1,000 mg by mouth 2 (two) times daily. 10/13/17  Yes [provider]  methocarbamol (ROBAXIN) 500 MG tablet Take 1 tablet (500 mg total) by mouth every 6 (six) hours as needed for muscle spasms. 07/28/19  Yes [name], MD  Omega-3 Fatty Acids (FISH OIL) 1000 MG CAPS Take 1,000 mg by mouth 2 (two) times daily.    Yes [provider]  ondansetron (ZOFRAN-ODT) 4 MG disintegrating tablet Take 1 tablet (4 mg total) by mouth every 6 (six) hours as needed for nausea. 07/28/19  Yes [name], MD  oxyCODONE (OXY IR/ROXICODONE) 5 MG immediate release tablet Take 1-2 tablets (5-10 mg total) by  mouth every 4 (four) hours as needed for moderate pain, severe pain or breakthrough pain. 07/28/19  Yes [name], MD  pioglitazone (ACTOS) 45 MG tablet Take 45 mg by mouth daily. 10/13/17  Yes [provider]  prochlorperazine (COMPAZINE) 10 MG tablet Take 1 tablet (10 mg total) by mouth every 6 (six) hours as needed for nausea or vomiting. 04/14/19  Yes Ladell Pier, MD  lidocaine-prilocaine (EMLA) cream Apply 1 application topically as needed. Patient taking differently: Apply 1 application topically as needed (port access).  01/26/19   Ladell Pier, MD  magic mouthwash SOLN Take 5 mLs by mouth 4 (four) times daily as needed for mouth pain. Swish and spit Patient not taking: Reported on 08/07/2019 06/06/19   Owens Shark, NP    Family History Family History  Problem Relation Age of Onset   Diabetes  Mother    Diabetes Father    CAD Father    Melanoma Father    Breast cancer Paternal Aunt 107   COPD Maternal Aunt    Melanoma Paternal Uncle    Stroke Maternal Grandmother    Tuberculosis Paternal Grandmother    Cancer Neg Hx     Social History Social History   Tobacco Use   Smoking status: Current Every Day Smoker    Packs/day: 1.00    Types: Cigarettes   Smokeless tobacco: Never Used  Substance Use Topics   Alcohol use: No    Frequency: Never   Drug use: No     Allergies   Patient has no known allergies.   Review of Systems Review of Systems  Constitutional: Negative for fever.  HENT: Negative for ear pain and sore throat.   Eyes: Negative for visual disturbance.  Respiratory: Negative for cough and shortness of breath.   Cardiovascular: Negative for chest pain.  Gastrointestinal: Positive for nausea and vomiting. Negative for abdominal pain, blood in stool and constipation.  Genitourinary: Negative for dysuria and hematuria.  Musculoskeletal: Negative for back pain.  Skin: Negative for rash.  Neurological: Positive for syncope.  Negative for dizziness, light-headedness and headaches.  All other systems reviewed and are negative.    Physical Exam Updated Vital Signs BP (!) 87/50    Pulse 98    Temp 99 F (37.2 C) (Oral)    Resp (!) 26    Ht 6\' 4"  (1.93 m)    Wt 88 kg    SpO2 96%    BMI 23.61 kg/m   Physical Exam Vitals signs and nursing note reviewed.  Constitutional:      Appearance: He is well-developed.  HENT:     Head: Normocephalic and atraumatic.     Mouth/Throat:     Mouth: Mucous membranes are dry.  Eyes:     Conjunctiva/sclera: Conjunctivae normal.  Neck:     Musculoskeletal: Neck supple.  Cardiovascular:     Rate and Rhythm: Regular rhythm. Tachycardia present.     Pulses: Normal pulses.     Heart sounds: Normal heart sounds. No murmur.  Pulmonary:     Effort: Pulmonary effort is normal. No respiratory distress.     Breath sounds: Rales (RLL) present. No wheezing or rhonchi.  Abdominal:     General: Bowel sounds are normal.     Palpations: Abdomen is soft.     Tenderness: There is no abdominal tenderness.     Comments: Large open wound to abdomen with granulation tissue present.  No surrounding erythema, warmth or significant tenderness.  No obvious pus drainage.  Musculoskeletal:        General: No tenderness.     Right lower leg: No edema.     Left lower leg: No edema.  Skin:    General: Skin is warm and dry.  Neurological:     Mental Status: He is alert.     Comments: Mental Status:  Alert, thought content appropriate, able to give a coherent history. Speech fluent without evidence of aphasia. Able to follow 2 step commands without difficulty.  Cranial Nerves:  II: pupils equal, round, reactive to light III,IV, VI: ptosis not present, extra-ocular motions intact bilaterally  V,VII: smile symmetric, facial light touch sensation equal VIII: hearing grossly normal to voice  X: uvula elevates symmetrically  XI: bilateral shoulder shrug symmetric and strong XII: midline tongue  extension without fassiculations Motor:  Normal tone. 5/5 strength of BUE and  BLE major muscle groups including strong and equal grip strength and dorsiflexion/plantar flexion Sensory: light touch normal in all extremities. CV: 2+ radial and DP pulses     ED Treatments / Results  Labs (all labs ordered are listed, but only abnormal results are displayed) Labs Reviewed  LACTIC ACID, PLASMA - Abnormal; Notable for the following components:      Result Value   Lactic Acid, Venous 3.0 (*)    All other components within normal limits  COMPREHENSIVE METABOLIC PANEL - Abnormal; Notable for the following components:   Sodium 131 (*)    Potassium 3.3 (*)    CO2 21 (*)    Glucose, Bld 175 (*)    BUN 23 (*)    Calcium 7.9 (*)    Total Protein 6.3 (*)    Albumin 2.6 (*)    Alkaline Phosphatase 262 (*)    All other components within normal limits  CBC WITH DIFFERENTIAL/PLATELET - Abnormal; Notable for the following components:   RBC 3.02 (*)    Hemoglobin 9.1 (*)    HCT 27.9 (*)    Lymphs Abs 0.1 (*)    All other components within normal limits  D-DIMER, QUANTITATIVE (NOT AT [facility]) - Abnormal; Notable for the following components:   D-Dimer, Quant 18.14 (*)    All other components within normal limits  CBG MONITORING, ED - Abnormal; Notable for the following components:   Glucose-Capillary 177 (*)    All other components within normal limits  SARS CORONAVIRUS 2 (HOSPITAL ORDER, [facility])  CULTURE, BLOOD (ROUTINE X 2)  CULTURE, BLOOD (ROUTINE X 2)  URINE CULTURE  C DIFFICILE QUICK SCREEN W PCR REFLEX  LACTIC ACID, PLASMA  LIPASE, BLOOD  URINALYSIS, ROUTINE W REFLEX MICROSCOPIC    EKG EKG Interpretation  Date/Time:  Monday August 07 2019 16:11:53 EDT Ventricular Rate:  115 PR Interval:    QRS Duration: 88 QT Interval:  303 QTC Calculation: 419 R Axis:   89 Text Interpretation:  Sinus tachycardia Multiform ventricular premature complexes No  significant change since last tracing Confirmed by Dorie Rank [id number]) on 08/07/2019 4:18:53 PM   Radiology Ct Angio Chest Pe W And/or Wo Contrast  Result Date: 08/07/2019 CLINICAL DATA:  Syncopal episode. History of pancreatic cancer status post Whipple procedure 3 weeks ago. EXAM: CT ANGIOGRAPHY CHEST CT ABDOMEN AND PELVIS WITH CONTRAST TECHNIQUE: Multidetector CT imaging of the chest was performed using the standard protocol during bolus administration of intravenous contrast. Multiplanar CT image reconstructions and MIPs were obtained to evaluate the vascular anatomy. Multidetector CT imaging of the abdomen and pelvis was performed using the standard protocol during bolus administration of intravenous contrast. CONTRAST:  152mL OMNIPAQUE IOHEXOL 350 MG/ML SOLN COMPARISON:  CT chest, abdomen, and pelvis dated May 11, 2019. FINDINGS: CTA CHEST FINDINGS Cardiovascular: Suboptimal opacification of the pulmonary arteries. No central pulmonary embolism. Normal heart size. No pericardial effusion. Three-vessel coronary atherosclerosis. No thoracic aortic aneurysm or dissection. Unchanged left chest wall port catheter with tip at the cavoatrial junction. Mediastinum/Nodes: No enlarged mediastinal, hilar, or axillary lymph nodes. Thyroid gland, trachea, and esophagus demonstrate no significant findings. Lungs/Pleura: Dependent subsegmental atelectasis in both lower lobes. No focal consolidation, pleural effusion, or pneumothorax. No suspicious pulmonary nodule. Musculoskeletal: No chest wall abnormality. No acute or significant osseous findings. Review of the MIP images confirms the above findings. CT ABDOMEN AND PELVIS FINDINGS Hepatobiliary: No focal liver abnormality. Mild periportal edema. Interval cholecystectomy with choledochojejunostomy. 7.8 x 6.1 x  8.8 cm rim enhancing fluid collection inferior to the right liver. Additional 2.8 x 1.7 cm rim enhancing fluid collection with small focus of gas in the right  upper quadrant just inferior to the falciform ligament. Pancreas: Interval resection of the pancreatic head and proximal body with pancreaticojejunostomy. Spleen: Normal in size without focal abnormality. Adrenals/Urinary Tract: The adrenal glands are unremarkable. Unchanged 1.9 cm right renal simple cyst. No renal calculi or hydronephrosis. Small foci of dependent air within the bladder likely related to recent instrumentation. No bladder wall thickening. Stomach/Bowel: Prior duodenectomy with gastrojejunostomy. Moderate wall thickening and pericolonic inflammatory changes involving the distal ascending, transverse, and descending colon. Moderate sigmoid diverticulosis. Normal appendix. Jejunostomy feeding tube in place. Vascular/Lymphatic: Aortic atherosclerosis. No enlarged abdominal or pelvic lymph nodes. Reproductive: Prostate is unremarkable. Other: Unchanged small fat containing bilateral inguinal hernias. Trace ascites. No pneumoperitoneum. Musculoskeletal: Open upper abdominal midline wound. No acute or significant osseous findings. Review of the MIP images confirms the above findings. IMPRESSION: Chest: 1. Suboptimal evaluation of the pulmonary arteries. No central pulmonary embolism. If clinical concern remains high, consider V/Q scan for further evaluation. Abdomen and pelvis: 1. Postsurgical changes related to interval Whipple procedure with two rim enhancing fluid collections in the right upper quadrant measuring up to 7.8 x 6.1 x 8.8 cm, concerning for abscesses. 2. Moderate wall thickening and pericolonic inflammatory changes involving the distal ascending, transverse, and descending colon, consistent with colitis. 3.  Aortic atherosclerosis (ICD10-I70.0). Electronically Signed   By: Titus Dubin M.D.   On: 08/07/2019 17:50   Ct Abdomen Pelvis W Contrast  Result Date: 08/07/2019 CLINICAL DATA:  Syncopal episode. History of pancreatic cancer status post Whipple procedure 3 weeks ago. EXAM: CT  ANGIOGRAPHY CHEST CT ABDOMEN AND PELVIS WITH CONTRAST TECHNIQUE: Multidetector CT imaging of the chest was performed using the standard protocol during bolus administration of intravenous contrast. Multiplanar CT image reconstructions and MIPs were obtained to evaluate the vascular anatomy. Multidetector CT imaging of the abdomen and pelvis was performed using the standard protocol during bolus administration of intravenous contrast. CONTRAST:  148mL OMNIPAQUE IOHEXOL 350 MG/ML SOLN COMPARISON:  CT chest, abdomen, and pelvis dated May 11, 2019. FINDINGS: CTA CHEST FINDINGS Cardiovascular: Suboptimal opacification of the pulmonary arteries. No central pulmonary embolism. Normal heart size. No pericardial effusion. Three-vessel coronary atherosclerosis. No thoracic aortic aneurysm or dissection. Unchanged left chest wall port catheter with tip at the cavoatrial junction. Mediastinum/Nodes: No enlarged mediastinal, hilar, or axillary lymph nodes. Thyroid gland, trachea, and esophagus demonstrate no significant findings. Lungs/Pleura: Dependent subsegmental atelectasis in both lower lobes. No focal consolidation, pleural effusion, or pneumothorax. No suspicious pulmonary nodule. Musculoskeletal: No chest wall abnormality. No acute or significant osseous findings. Review of the MIP images confirms the above findings. CT ABDOMEN AND PELVIS FINDINGS Hepatobiliary: No focal liver abnormality. Mild periportal edema. Interval cholecystectomy with choledochojejunostomy. 7.8 x 6.1 x 8.8 cm rim enhancing fluid collection inferior to the right liver. Additional 2.8 x 1.7 cm rim enhancing fluid collection with small focus of gas in the right upper quadrant just inferior to the falciform ligament. Pancreas: Interval resection of the pancreatic head and proximal body with pancreaticojejunostomy. Spleen: Normal in size without focal abnormality. Adrenals/Urinary Tract: The adrenal glands are unremarkable. Unchanged 1.9 cm right renal  simple cyst. No renal calculi or hydronephrosis. Small foci of dependent air within the bladder likely related to recent instrumentation. No bladder wall thickening. Stomach/Bowel: Prior duodenectomy with gastrojejunostomy. Moderate wall thickening and pericolonic inflammatory changes involving the  distal ascending, transverse, and descending colon. Moderate sigmoid diverticulosis. Normal appendix. Jejunostomy feeding tube in place. Vascular/Lymphatic: Aortic atherosclerosis. No enlarged abdominal or pelvic lymph nodes. Reproductive: Prostate is unremarkable. Other: Unchanged small fat containing bilateral inguinal hernias. Trace ascites. No pneumoperitoneum. Musculoskeletal: Open upper abdominal midline wound. No acute or significant osseous findings. Review of the MIP images confirms the above findings. IMPRESSION: Chest: 1. Suboptimal evaluation of the pulmonary arteries. No central pulmonary embolism. If clinical concern remains high, consider V/Q scan for further evaluation. Abdomen and pelvis: 1. Postsurgical changes related to interval Whipple procedure with two rim enhancing fluid collections in the right upper quadrant measuring up to 7.8 x 6.1 x 8.8 cm, concerning for abscesses. 2. Moderate wall thickening and pericolonic inflammatory changes involving the distal ascending, transverse, and descending colon, consistent with colitis. 3.  Aortic atherosclerosis (ICD10-I70.0). Electronically Signed   By: Titus Dubin M.D.   On: 08/07/2019 17:50   Dg Chest Port 1 View  Result Date: 08/07/2019 CLINICAL DATA:  Syncopal episode. EXAM: PORTABLE CHEST 1 VIEW COMPARISON:  Chest CT 08/07/2019 FINDINGS: The cardiac silhouette, mediastinal and hilar contours are within normal limits and stable. The lungs are clear. No pleural effusion. Left subclavian power port in good position, unchanged. The bony thorax is intact. IMPRESSION: No acute cardiopulmonary findings. Electronically Signed   By: Marijo Sanes M.D.    On: 08/07/2019 17:02    Procedures Procedures (including critical care time) CRITICAL CARE Performed by: Rodney Booze   Total critical care time: 35 minutes  Critical care time was exclusive of separately billable procedures and treating other patients.  Critical care was necessary to treat or prevent imminent or life-threatening deterioration.  Critical care was time spent personally by me on the following activities: development of treatment plan with patient and/or surrogate as well as nursing, discussions with consultants, evaluation of patient's response to treatment, examination of patient, obtaining history from patient or surrogate, ordering and performing treatments and interventions, ordering and review of laboratory studies, ordering and review of radiographic studies, pulse oximetry and re-evaluation of patient's condition.   Medications Ordered in ED Medications  0.9 %  sodium chloride infusion (1,000 mLs Intravenous New Bag/Given 08/07/19 1648)  sodium chloride (PF) 0.9 % injection (has no administration in time range)  nicotine (NICODERM CQ - dosed in mg/24 hr) patch 7 mg (7 mg Transdermal Patch Applied 08/07/19 1820)  sodium chloride 0.9 % bolus 1,000 mL (0 mLs Intravenous Stopped 08/07/19 1956)  metroNIDAZOLE (FLAGYL) IVPB 500 mg (0 mg Intravenous Stopped 08/07/19 1956)  acetaminophen (TYLENOL) tablet 1,000 mg (1,000 mg Oral Given 08/07/19 1550)  ondansetron (ZOFRAN) injection 4 mg (4 mg Intravenous Given 08/07/19 1553)  vancomycin (VANCOCIN) 2,000 mg in sodium chloride 0.9 % 500 mL IVPB (2,000 mg Intravenous New Bag/Given 08/07/19 1820)  ceFEPIme (MAXIPIME) 2 g in sodium chloride 0.9 % 100 mL IVPB (0 g Intravenous Stopped 08/07/19 1956)  iohexol (OMNIPAQUE) 350 MG/ML injection 100 mL (100 mLs Intravenous Contrast Given 08/07/19 1703)  sodium chloride 0.9 % bolus 1,000 mL (0 mLs Intravenous Stopped 08/07/19 1956)  sodium chloride 0.9 % bolus 1,000 mL (0 mLs Intravenous  Stopped 08/07/19 1956)  ibuprofen (ADVIL) tablet 600 mg (600 mg Oral Given 08/07/19 1814)     Initial Impression / Assessment and Plan / ED Course  I have reviewed the triage vital signs and the nursing notes.  Pertinent labs & imaging results that were available during my care of the patient were reviewed by me  and considered in my medical decision making (see chart for details).   Final Clinical Impressions(s) / ED Diagnoses   Final diagnoses:  Postprocedural intraabdominal abscess  Sepsis, due to unspecified organism, unspecified whether acute organ dysfunction present [facility])   58 year old male with history of pancreatic cancer s/p Whipple procedure 07/2019 presenting for evaluation of nausea, vomiting, diarrhea for several days and syncopal episode today.  On arrival to the ED, patient noted to be febrile to 101.26F.  Also tachycardic with increased respiratory rate.  Initially not hypotensive with normal O2 sats.  Code sepsis initiated and broad spectrum abx and ivf were given to the patient.   Labs reviewed: CBC was without leukocytosis, anemia present and at baseline CMP with mild hypokalemia & hyponatremia, also with elevated BUN but normal Cr. Liver enzymes WNL.  Lipase WNL Lactic acid elevated at 3.0 Ddimer elevated at 18 COVID negative UA pending on admission Blood cultures obtained  EKG with sinus tachycardia, PVCs, unchanged from prior   CXR nonacute   CTA chest Suboptimal evaluation of the pulmonary arteries. No central pulmonary embolism.  CT abd/pelvis postsurgical changes related to interval Whipple procedure with two rim enhancing fluid collections in the right upper quadrant measuring up to 7.8 x 6.1 x 8.8 cm, concerning for abscesses. Moderate wall thickening and pericolonic inflammatory changes involving the distal ascending, transverse, and descending colon, consistent with colitis. Aortic atherosclerosis (ICD10-I70.0).  -added c diff  6:53 PM CONSULT with  Dr Lucia Gaskins with general surgery who will consult on the patient. He will make Dr. Barry Dienes aware of the patient. Recommends medicine admission.    7:52 PM PM CONSULT with Dr. Posey Pronto who accepts patient for admission   ED Discharge Orders    None       Bishop Dublin 08/07/19 2029    Dorie Rank, MD 08/09/19 1125

## 2019-08-07 NOTE — Progress Notes (Signed)
A consult was received from an ED physician for vancomycin & cefepime per pharmacy dosing.  The patient's profile has been reviewed for ht/wt/allergies/indication/available labs.   A one time order has been placed for vancomycin 2 gm and cefepime 2 gm.    Further antibiotics/pharmacy consults should be ordered by admitting physician if indicated.                       Thank you, Eudelia Bunch, Pharm.D [phone] 08/07/2019 3:41 PM

## 2019-08-07 NOTE — ED Notes (Signed)
Family at bedside. 

## 2019-08-07 NOTE — ED Notes (Signed)
Patient transported to CT 

## 2019-08-07 NOTE — ED Notes (Signed)
Gave report to [place], [place] for room 1228.

## 2019-08-07 NOTE — Consult Note (Addendum)
Re:   Brian Torres DOB:   12/05/1960 MRN:   [id number]  Chief Complaint Weakness, chills  ASSESEMENT AND PLAN: 1.  Probable intra-abdominal abscess  WBC - 6,700 - 08/07/2019  Will discuss with Dr. Barry Dienes - will probably need IR to drain.  One problem is that he is anticoagulated - for history of DVT.  2.  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 Benay Spice  GI is Dr. Watt Climes 2B.  Has open wound that needs daily dressing changes.  3.  Colitis per CT scan  Has had irregular BM's since surgery  C Diff pending - Neg 4.  DM on insulin  Glucose - 175 - 08/07/2019 5.  Anemia  Hgb - 9.1 - 08/07/2019 6.  Hypokalemia  K+ - 3.2 - 08/07/2019 7.  History of LLE DVT  Required thrombectomy in 2018 of LLE  On Eliquis  Chief Complaint  Patient presents with  . Loss of Consciousness  . Nausea   PHYSICIAN REQUESTING CONSULTATION: Cari Caraway, MD  HISTORY OF PRESENT ILLNESS: Brian Torres is a 58 y.o. (DOB: 1961/07/23)  white male whose primary care physician is Cari Caraway, MD.  Wife, [name], at bedside.   [name] was recently hospitalized from 07/20/2019 - 07/29/2019 for the resection of a pancreatic cancer by Dr. Barry Dienes on 07/20/2019 at [facility].  He developed a wound infection and had his wound opened.  He has been home for about 9 days.  He was seen in our office last week by [facility] - more for his wound.  Today, he developed weakness and chills and was brought by EMS to [facility] ER.  Note that he was on Augmentin at home.  CT chest - 08/07/2019 - suboptimal, but no obvious central PE CT abdomen - 08/07/2019 - 1. Postsurgical changes related to interval Whipple procedure with two rim enhancing fluid collections in the right upper quadrant measuring up to 7.8 x 6.1 x 8.8 cm, concerning for abscesses.  2. Moderate wall thickening and  pericolonic inflammatory changes involving the distal ascending, transverse, and descending colon,consistent with colitis.  3.  Aortic atherosclerosis (ICD10-I70.0).   Past Medical History:  Diagnosis Date  . Cancer ([place]) 12/2018   pancreatic cancer  . Depression   . Diabetes mellitus without complication ([place])    type 2  . Dyslipidemia   . Erectile dysfunction   . Family history of breast cancer   . Family history of melanoma   . Hypertension    white coat syndrome  . Nicotine addiction       Past Surgical History:  Procedure Laterality Date  . BILIARY STENT PLACEMENT N/A 12/21/2018   Procedure: BILIARY STENT PLACEMENT;  Surgeon: Clarene Essex, MD;  Location: [facility];  Service: Endoscopy;  Laterality: N/A;  . BILIARY STENT PLACEMENT N/A 01/24/2019   Procedure: BILIARY STENT PLACEMENT;  Surgeon: Arta Silence, MD;  [facility];  Service: Endoscopy;  Laterality: N/A;  . BIOPSY  12/21/2018   Procedure: BIOPSY;  Surgeon: Clarene Essex, MD;  [facility];  Service: Endoscopy;;  . ENDOSCOPIC RETROGRADE CHOLANGIOPANCREATOGRAPHY (ERCP) WITH PROPOFOL N/A 12/21/2018   Procedure: ENDOSCOPIC RETROGRADE CHOLANGIOPANCREATOGRAPHY (ERCP) WITH PROPOFOL;  Surgeon: Clarene Essex, MD;  [facility];  Service: Endoscopy;  Laterality: N/A;  . ENDOSCOPIC RETROGRADE CHOLANGIOPANCREATOGRAPHY (ERCP) WITH PROPOFOL N/A 01/24/2019   Procedure: ENDOSCOPIC RETROGRADE CHOLANGIOPANCREATOGRAPHY (  ERCP) WITH PROPOFOL;  Surgeon: Arta Silence, MD;  [facility];  Service: Endoscopy;  Laterality: N/A;  Balloon Sweep of Duct  . ESOPHAGOGASTRODUODENOSCOPY N/A 01/03/2019   Procedure: ESOPHAGOGASTRODUODENOSCOPY (EGD);  Surgeon: Arta Silence, MD;  Location: [name];  Service: Endoscopy;  Laterality: N/A;  . ESOPHAGOGASTRODUODENOSCOPY (EGD) WITH PROPOFOL N/A 01/24/2019   Procedure: ESOPHAGOGASTRODUODENOSCOPY (EGD) WITH PROPOFOL;  Surgeon: Arta Silence, MD;  [facility];  Service: Endoscopy;  Laterality: N/A;  stent removal  . EUS N/A 01/03/2019   Procedure: FULL UPPER ENDOSCOPIC ULTRASOUND (EUS) Linear not radial;  Surgeon: Arta Silence, MD;  [facility];  Service: Endoscopy;  Laterality: N/A;  . EUS N/A 01/24/2019   Procedure: FULL UPPER ENDOSCOPIC ULTRASOUND (EUS) RADIAL;  Surgeon: Arta Silence, MD;  [facility];  Service: Endoscopy;  Laterality: N/A;  . FINE NEEDLE ASPIRATION N/A 01/03/2019   Procedure: FINE NEEDLE ASPIRATION (FNA) LINEAR;  Surgeon: Arta Silence, MD;  [facility];  Service: Endoscopy;  Laterality: N/A;  . FINE NEEDLE ASPIRATION N/A 01/24/2019   Procedure: FINE NEEDLE ASPIRATION (FNA) LINEAR;  Surgeon: Arta Silence, MD;  [facility];  Service: Endoscopy;  Laterality: N/A;  . LAPAROSCOPY N/A 07/20/2019   Procedure: LAPAROSCOPY DIAGNOSTIC;  Surgeon: [name], MD;  Location: [place];  Service: General;  Laterality: N/A;  . LOWER EXTREMITY ANGIOGRAPHY N/A 10/18/2017   Procedure: LOWER EXTREMITY ANGIOGRAPHY - IVUS - LYSIS CATH;  Surgeon: Waynetta Sandy, MD;  Location: [place] [facility];  Service: Cardiovascular;  Laterality: N/A;  . LOWER EXTREMITY ANGIOGRAPHY N/A 10/19/2017   Procedure: LOWER EXTREMITY ANGIOGRAPHY - LYSIS RECHECK;  Surgeon: Waynetta Sandy, MD;  Location: [place] [facility];  Service: Cardiovascular;  Laterality: N/A;  . PANCREATIC STENT PLACEMENT  12/21/2018   Procedure: PANCREATIC STENT PLACEMENT;  Surgeon: Clarene Essex, MD;  [facility];  Service: Endoscopy;;  . PEG PLACEMENT N/A 07/20/2019   Procedure: FEEDING TUBE;  Surgeon: [name], MD;  Location: [place];  Service: General;  Laterality: N/A;  . PERIPHERAL VASCULAR BALLOON ANGIOPLASTY Left 10/19/2017   Procedure: PERIPHERAL VASCULAR BALLOON ANGIOPLASTY;  Surgeon: Waynetta Sandy, MD;  Location: [place] [facility];  Service: Cardiovascular;  Laterality: Left;  Multiple  Lower extremity venous sites  . PERIPHERAL VASCULAR INTERVENTION Left 10/18/2017   Procedure: PERIPHERAL VASCULAR INTERVENTION;  Surgeon: Waynetta Sandy, MD;  Location: [place] [facility];  Service: Cardiovascular;  Laterality: Left;  . PORTACATH PLACEMENT Left 02/02/2019   Procedure: INSERTION PORT-A-CATH WITH POSSIBLE ULTRASOUND;  Surgeon: [name], MD;  Location: [place];  Service: General;  Laterality: Left;  . REMOVAL OF STONES  12/21/2018   Procedure: REMOVAL OF STONES;  Surgeon: Clarene Essex, MD;  [facility];  Service: Endoscopy;;  . Joan Mayans  12/21/2018   Procedure: Joan Mayans;  Surgeon: Clarene Essex, MD;  [facility];  Service: Endoscopy;;  . STENT REMOVAL  01/24/2019   Procedure: STENT REMOVAL;  Surgeon: Arta Silence, MD;  [facility];  Service: Endoscopy;;  . WHIPPLE PROCEDURE N/A 07/20/2019   Procedure: WHIPPLE PROCEDURE;  Surgeon: [name], MD;  Location: [place];  Service: General;  Laterality: N/A;      Current Facility-Administered Medications  Medication Dose Route Frequency Provider Last Rate Last Dose  . 0.9 %  sodium chloride infusion   Intravenous PRN Dorie Rank, MD 5 mL/hr at 08/07/19 1648 1,000 mL at 08/07/19 1648  . nicotine (NICODERM CQ - dosed in mg/24 hr) patch 7  mg  7 mg Transdermal Once Dorie Rank, MD   7 mg at 08/07/19 1820  . sodium chloride (PF) 0.9 % injection           . vancomycin (VANCOCIN) 2,000 mg in sodium chloride 0.9 % 500 mL IVPB  2,000 mg Intravenous Once Eudelia Bunch, RPH 250 mL/hr at 08/07/19 1820 2,000 mg at 08/07/19 1820   Current Outpatient Medications  Medication Sig Dispense Refill  . acetaminophen (TYLENOL) 500 MG tablet Take 1,000 mg by mouth every 6 (six) hours as needed for moderate pain or headache.    . Amino Acids-Protein Hydrolys (FEEDING SUPPLEMENT, PRO-STAT SUGAR FREE 64,) LIQD Place 30 mLs into feeding tube 3 (three) times daily. 887 mL 0  .  amoxicillin-clavulanate (AUGMENTIN) 875-125 MG tablet Take 1 tablet by mouth every 12 (twelve) hours. 20 tablet 0  . apixaban (ELIQUIS) 5 MG TABS tablet Take 1 tablet (5 mg total) by mouth 2 (two) times daily. 60 tablet 0  . b complex vitamins tablet Take 1 tablet by mouth daily.    Marland Kitchen CINNAMON PO Take 1,000 mg by mouth 2 (two) times daily.    . Coenzyme Q10 (COQ-10 PO) Take 1 tablet by mouth daily.    . famotidine (PEPCID AC) 10 MG tablet Take 10 mg by mouth 2 (two) times daily as needed for heartburn or indigestion.    . feeding supplement, ENSURE ENLIVE, (ENSURE ENLIVE) LIQD Take 237 mLs by mouth 3 (three) times daily between meals. 237 mL 12  . ferrous sulfate 325 (65 FE) MG EC tablet Take 325 mg by mouth 2 (two) times a day.     Marland Kitchen glimepiride (AMARYL) 4 MG tablet Take 4 mg by mouth daily with breakfast.     . HUMALOG KWIKPEN 100 UNIT/ML KwikPen Inject 3-13 Units into the skin 2 (two) times daily as needed (if bs is over 150).     . Insulin Glargine (BASAGLAR KWIKPEN) 100 UNIT/ML SOPN Inject 19 Units into the skin at bedtime.     Marland Kitchen lisinopril (PRINIVIL,ZESTRIL) 10 MG tablet Take 10 mg by mouth daily.    Marland Kitchen LORazepam (ATIVAN) 0.5 MG tablet Take 1 tablet (0.5 mg total) by mouth every 8 (eight) hours as needed (for nausea). 50 tablet 0  . metFORMIN (GLUCOPHAGE) 1000 MG tablet Take 1,000 mg by mouth 2 (two) times daily.    . methocarbamol (ROBAXIN) 500 MG tablet Take 1 tablet (500 mg total) by mouth every 6 (six) hours as needed for muscle spasms. 20 tablet 0  . Omega-3 Fatty Acids (FISH OIL) 1000 MG CAPS Take 1,000 mg by mouth 2 (two) times daily.     . ondansetron (ZOFRAN-ODT) 4 MG disintegrating tablet Take 1 tablet (4 mg total) by mouth every 6 (six) hours as needed for nausea. 20 tablet 0  . oxyCODONE (OXY IR/ROXICODONE) 5 MG immediate release tablet Take 1-2 tablets (5-10 mg total) by mouth every 4 (four) hours as needed for moderate pain, severe pain or breakthrough pain. 30 tablet 0  .  pioglitazone (ACTOS) 45 MG tablet Take 45 mg by mouth daily.    . prochlorperazine (COMPAZINE) 10 MG tablet Take 1 tablet (10 mg total) by mouth every 6 (six) hours as needed for nausea or vomiting. 60 tablet 1  . lidocaine-prilocaine (EMLA) cream Apply 1 application topically as needed. (Patient taking differently: Apply 1 application topically as needed (port access). ) 30 g 1  . magic mouthwash SOLN Take 5 mLs by mouth 4 (four)  times daily as needed for mouth pain. Swish and spit (Patient not taking: Reported on 08/07/2019) 240 mL 0     No Known Allergies  REVIEW OF SYSTEMS: Skin:  No history of rash.  No history of abnormal moles. Infection:  On Augment for wound Neurologic:  No history of stroke.  No history of seizure.  No history of headaches. Cardiac:  No history of hypertension. No history of heart disease.  No history of prior cardiac catheterization.  No history of seeing a cardiologist. Pulmonary:  Smokes  Endocrine:  DM x 12 years, but did not start insulin until this year. Gastrointestinal:  See HPI Urologic:  No history of kidney stones.  No history of bladder infections. Musculoskeletal:  No history of joint or back disease. Hematologic:  Hx of LLE DVT.  On Eliquis Psycho-social:  The patient is oriented.   The patient has no obvious psychologic or social impairment to understanding our conversation and plan.  SOCIAL and FAMILY HISTORY: Married.  Wife [name] here. He has 3 children:  33 yo, 59 yo, and 57 yo  He retired about 4 years ago from [employer].  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  He does some work flipping houses.   PHYSICAL EXAM: BP (!) 96/53   Pulse (!) 110   Temp (!) 101.6 F (38.7 C) (Oral)   Resp (!) 27   Ht 6\' 4"  (1.93 m)   Wt 88 kg   SpO2 95%   BMI 23.61 kg/m   General: WN WM who is alert.  He does not look that sick.  Skin:  Inspection and palpation - no mass or rash. Eyes:  Conjunctiva and lids unremarkable.             Pupils are equal Ears, Nose, Mouth, and Throat:  Ears and nose unremarkable            Lips and teeth are unremarable. Neck: Supple. No mass, trachea midline.  No thyroid mass. Lymph Nodes:  No supraclavicular, cervical, or inguinal nodes. Lungs: Normal respiratory effort.  Clear to auscultation and symmetric breath sounds.  Port in left upper chest Heart:  Palpation of the heart is normal.            Auscultation: RRR. No murmur or rub.  Abdomen: Soft. Open wound that could be cleaned up some.  Gastrostomy tube in left mid abdomen.  Mildly distended, but no localized tenderness.  Rectal: Not done. Musculoskeletal:  Good muscle strength and ROM  in upper and lower extremities.  Neurologic:  Grossly intact to motor and sensory function. Psychiatric: Normal judgement and insight. Behavior is normal.            Oriented to time, person, place.   DATA REVIEWED, COUNSELING AND COORDINATION OF CARE: Epic notes reviewed. Counseling and coordination of care exceeded more than 50% of the time spent with patient. Total time spent with patient and charting: 45 minutes.  Alphonsa Overall, MD,  [facility], [address]  [place], [place]    [place] Phone:  [phone] FAX:  [phone]

## 2019-08-07 NOTE — H&P (Signed)
History and Physical    Brian Torres KPT:[id number] DOB: 1961/09/21 DOA: 08/07/2019  PCP: Cari Caraway, MD  Patient coming from: Home  I have personally briefly reviewed patient's old medical records in [place]  Chief Complaint: Nausea, vomiting, near syncope  HPI: Brian Torres is a 58 y.o. 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  Patient was recently hospitalized from 07/20/2019-07/29/2019 after undergoing Whipple procedure on 07/20/2019 for resection of cancer of the head of the pancreas.  He required ICU stay for a wound infection.  On discharge to home he had been feeling well until about 3 days ago.  He began to have poor appetite.  Today he developed new onset nausea with vomiting and while standing up he became lightheaded and had a near syncopal episode.  Fortunately his wife was with him and was able to guide him gently to the ground.  He denied any injury.  He reports a new fever today.  He denies any chest pain or dyspnea.  He states he has had decreased urine output recently.  He reports having bowel movements approximately twice a day which appear to be loose in consistency including an episode of incontinence earlier today.  He is taking Eliquis for history of DVT he denies any obvious bleeding including epistaxis, hemoptysis, hematemesis, hematuria, hematochezia, or melena.  ED Course:  Initial vitals showed BP 124/57, pulse 131, RR 17, temp 101.2 Fahrenheit, SPO2 96% on room air  Labs notable for WBC 6.7, hemoglobin 9.1, platelets 193,000, sodium 131, potassium 3.3, BUN 23, creatinine 1.24, serum glucose 175, AST 27, ALT 23, alk phos 262, total bilirubin 0.7, albumin 2.6.  Lactic acid 3.0, repeat lactic acid 1.6.  Lipase 15, d-dimer 18.14.  Blood cultures were  obtained and pending.  SARS-CoV-2 test was negative.  Portable chest x-ray was negative for acute cardiopulmonary process.  CTA chest PE study was negative for central pulmonary embolism, suboptimal evaluation of the pulmonary arteries noted.  CT abdomen/pelvis with contrast showed postsurgical changes related to prior Whipple procedure with 2 rim-enhancing fluid collections in the RUQ measuring up to 7.8 x 6.1 x 8.8 cm.  Moderate wall thickening and pericolonic inflammatory changes were noted involving the distal ascending, transverse, and descending colon.  Patient was given 3 L normal saline and started on IV vancomycin, Flagyl, and cefepime.  General surgery were consulted and recommended medical admission and that Dr. Barry Dienes will evaluate patient tomorrow.  The hospitalist service was consulted to admit for further evaluation management.  Review of Systems: All systems reviewed and are negative except as documented in history of present illness above.   Past Medical History:  Diagnosis Date   Cancer ([place]) 12/2018   pancreatic cancer   Depression    Diabetes mellitus without complication ([place])    type 2   Dyslipidemia    Erectile dysfunction    Family history of breast cancer    Family history of melanoma    Hypertension    white coat syndrome   Nicotine addiction     Past Surgical History:  Procedure Laterality Date   BILIARY STENT PLACEMENT N/A 12/21/2018   Procedure: BILIARY STENT PLACEMENT;  Surgeon: Clarene Essex, MD;  Location: [facility];  Service: Endoscopy;  Laterality: N/A;   BILIARY STENT PLACEMENT N/A 01/24/2019   Procedure: BILIARY  STENT PLACEMENT;  Surgeon: Arta Silence, MD;  Location: [name];  Service: Endoscopy;  Laterality: N/A;   BIOPSY  12/21/2018   Procedure: BIOPSY;  Surgeon: Clarene Essex, MD;  [facility];  Service: Endoscopy;;   ENDOSCOPIC RETROGRADE CHOLANGIOPANCREATOGRAPHY (ERCP) WITH PROPOFOL N/A 12/21/2018   Procedure:  ENDOSCOPIC RETROGRADE CHOLANGIOPANCREATOGRAPHY (ERCP) WITH PROPOFOL;  Surgeon: Clarene Essex, MD;  [facility];  Service: Endoscopy;  Laterality: N/A;   ENDOSCOPIC RETROGRADE CHOLANGIOPANCREATOGRAPHY (ERCP) WITH PROPOFOL N/A 01/24/2019   Procedure: ENDOSCOPIC RETROGRADE CHOLANGIOPANCREATOGRAPHY (ERCP) WITH PROPOFOL;  Surgeon: Arta Silence, MD;  [facility];  Service: Endoscopy;  Laterality: N/A;  Balloon Sweep of Duct   ESOPHAGOGASTRODUODENOSCOPY N/A 01/03/2019   Procedure: ESOPHAGOGASTRODUODENOSCOPY (EGD);  Surgeon: Arta Silence, MD;  Location: [name];  Service: Endoscopy;  Laterality: N/A;   ESOPHAGOGASTRODUODENOSCOPY (EGD) WITH PROPOFOL N/A 01/24/2019   Procedure: ESOPHAGOGASTRODUODENOSCOPY (EGD) WITH PROPOFOL;  Surgeon: Arta Silence, MD;  [facility];  Service: Endoscopy;  Laterality: N/A;  stent removal   EUS N/A 01/03/2019   Procedure: FULL UPPER ENDOSCOPIC ULTRASOUND (EUS) Linear not radial;  Surgeon: Arta Silence, MD;  [facility];  Service: Endoscopy;  Laterality: N/A;   EUS N/A 01/24/2019   Procedure: FULL UPPER ENDOSCOPIC ULTRASOUND (EUS) RADIAL;  Surgeon: Arta Silence, MD;  [facility];  Service: Endoscopy;  Laterality: N/A;   FINE NEEDLE ASPIRATION N/A 01/03/2019   Procedure: FINE NEEDLE ASPIRATION (FNA) LINEAR;  Surgeon: Arta Silence, MD;  [facility];  Service: Endoscopy;  Laterality: N/A;   FINE NEEDLE ASPIRATION N/A 01/24/2019   Procedure: FINE NEEDLE ASPIRATION (FNA) LINEAR;  Surgeon: Arta Silence, MD;  [facility];  Service: Endoscopy;  Laterality: N/A;   LAPAROSCOPY N/A 07/20/2019   Procedure: LAPAROSCOPY DIAGNOSTIC;  Surgeon: [name], MD;  Location: [facility];  Service: General;  Laterality: N/A;   LOWER EXTREMITY ANGIOGRAPHY N/A 10/18/2017   Procedure: LOWER EXTREMITY ANGIOGRAPHY - IVUS - LYSIS CATH;  Surgeon: Waynetta Sandy, MD;  Location: [facility];   Service: Cardiovascular;  Laterality: N/A;   LOWER EXTREMITY ANGIOGRAPHY N/A 10/19/2017   Procedure: LOWER EXTREMITY ANGIOGRAPHY - LYSIS RECHECK;  Surgeon: Waynetta Sandy, MD;  Location: [facility];  Service: Cardiovascular;  Laterality: N/A;   PANCREATIC STENT PLACEMENT  12/21/2018   Procedure: PANCREATIC STENT PLACEMENT;  Surgeon: Clarene Essex, MD;  [facility];  Service: Endoscopy;;   PEG PLACEMENT N/A 07/20/2019   Procedure: FEEDING TUBE;  Surgeon: [name], MD;  Location: [facility];  Service: General;  Laterality: N/A;   PERIPHERAL VASCULAR BALLOON ANGIOPLASTY Left 10/19/2017   Procedure: PERIPHERAL VASCULAR BALLOON ANGIOPLASTY;  Surgeon: Waynetta Sandy, MD;  Location: [facility];  Service: Cardiovascular;  Laterality: Left;  Multiple Lower extremity venous sites   PERIPHERAL VASCULAR INTERVENTION Left 10/18/2017   Procedure: PERIPHERAL VASCULAR INTERVENTION;  Surgeon: Waynetta Sandy, MD;  Location: [facility];  Service: Cardiovascular;  Laterality: Left;   PORTACATH PLACEMENT Left 02/02/2019   Procedure: INSERTION PORT-A-CATH WITH POSSIBLE ULTRASOUND;  Surgeon: [name], MD;  Location: [facility];  Service: General;  Laterality: Left;   REMOVAL OF STONES  12/21/2018   Procedure: REMOVAL OF STONES;  Surgeon: Clarene Essex, MD;  [facility];  Service: Endoscopy;;   SPHINCTEROTOMY  12/21/2018   Procedure: Joan Mayans;  Surgeon: Clarene Essex, MD;  [facility];  Service: Endoscopy;;   STENT REMOVAL  01/24/2019   Procedure: STENT REMOVAL;  Surgeon: Arta Silence, MD;  [facility];  Service: Endoscopy;;   WHIPPLE PROCEDURE N/A 07/20/2019   Procedure: WHIPPLE PROCEDURE;  Surgeon: [name], MD;  Location: [facility];  Service: General;  Laterality: N/A;    Social History:  reports that he has been smoking cigarettes. He has been smoking about 1.00 pack per day. He has  never used smokeless tobacco. He reports that he does not drink alcohol or use drugs.  No Known Allergies  Family History  Problem Relation Age of Onset   Diabetes Mother    Diabetes Father    CAD Father    Melanoma Father    Breast cancer Paternal Aunt 63   COPD Maternal Aunt    Melanoma Paternal Uncle    Stroke Maternal Grandmother    Tuberculosis Paternal Grandmother    Cancer Neg Hx      Prior to Admission medications   Medication Sig Start Date End Date Taking? Authorizing Provider  acetaminophen (TYLENOL) 500 MG tablet Take 1,000 mg by mouth every 6 (six) hours as needed for moderate pain or headache.   Yes [provider]  Amino Acids-Protein Hydrolys (FEEDING SUPPLEMENT, PRO-STAT SUGAR FREE 64,) LIQD Place 30 mLs into feeding tube 3 (three) times daily. 07/28/19  Yes [name], MD  amoxicillin-clavulanate (AUGMENTIN) 875-125 MG tablet Take 1 tablet by mouth every 12 (twelve) hours. 07/28/19  Yes [name], MD  apixaban (ELIQUIS) 5 MG TABS tablet Take 1 tablet (5 mg total) by mouth 2 (two) times daily. 01/04/19  Yes Arta Silence, MD  b complex vitamins tablet Take 1 tablet by mouth daily.   Yes [provider]  CINNAMON PO Take 1,000 mg by mouth 2 (two) times daily.   Yes [provider]  Coenzyme Q10 (COQ-10 PO) Take 1 tablet by mouth daily.   Yes [provider]  famotidine (PEPCID AC) 10 MG tablet Take 10 mg by mouth 2 (two) times daily as needed for heartburn or indigestion.   Yes [provider]  feeding supplement, ENSURE ENLIVE, (ENSURE ENLIVE) LIQD Take 237 mLs by mouth 3 (three) times daily between meals. 07/28/19  Yes [name], MD  ferrous sulfate 325 (65 FE) MG EC tablet Take 325 mg by mouth 2 (two) times a day.    Yes [provider]  glimepiride (AMARYL) 4 MG tablet Take 4 mg by mouth daily with breakfast.  10/13/17  Yes [provider]  HUMALOG KWIKPEN 100 UNIT/ML KwikPen Inject  3-13 Units into the skin 2 (two) times daily as needed (if bs is over 150).  06/29/19  Yes [provider]  Insulin Glargine (BASAGLAR KWIKPEN) 100 UNIT/ML SOPN Inject 19 Units into the skin at bedtime.  10/19/18  Yes [provider]  lisinopril (PRINIVIL,ZESTRIL) 10 MG tablet Take 10 mg by mouth daily. 10/13/17  Yes [provider]  LORazepam (ATIVAN) 0.5 MG tablet Take 1 tablet (0.5 mg total) by mouth every 8 (eight) hours as needed (for nausea). 05/01/19  Yes Ladell Pier, MD  metFORMIN (GLUCOPHAGE) 1000 MG tablet Take 1,000 mg by mouth 2 (two) times daily. 10/13/17  Yes [provider]  methocarbamol (ROBAXIN) 500 MG tablet Take 1 tablet (500 mg total) by mouth every 6 (six) hours as needed for muscle spasms. 07/28/19  Yes [name], MD  Omega-3 Fatty Acids (FISH OIL) 1000 MG CAPS Take 1,000 mg by mouth 2 (two) times daily.    Yes [provider]  ondansetron (ZOFRAN-ODT) 4 MG disintegrating tablet Take 1 tablet (  4 mg total) by mouth every 6 (six) hours as needed for nausea. 07/28/19  Yes [name], MD  oxyCODONE (OXY IR/ROXICODONE) 5 MG immediate release tablet Take 1-2 tablets (5-10 mg total) by mouth every 4 (four) hours as needed for moderate pain, severe pain or breakthrough pain. 07/28/19  Yes [name], MD  pioglitazone (ACTOS) 45 MG tablet Take 45 mg by mouth daily. 10/13/17  Yes [provider]  prochlorperazine (COMPAZINE) 10 MG tablet Take 1 tablet (10 mg total) by mouth every 6 (six) hours as needed for nausea or vomiting. 04/14/19  Yes Ladell Pier, MD  lidocaine-prilocaine (EMLA) cream Apply 1 application topically as needed. Patient taking differently: Apply 1 application topically as needed (port access).  01/26/19   Ladell Pier, MD  magic mouthwash SOLN Take 5 mLs by mouth 4 (four) times daily as needed for mouth pain. Swish and spit Patient not taking: Reported on 08/07/2019 06/06/19   Owens Shark, NP     Physical Exam: Vitals:   08/07/19 1830 08/07/19 1900 08/07/19 1930 08/07/19 1959  BP: (!) 102/55 (!) 86/52 (!) 87/50   Pulse: (!) 110 (!) 101 98   Resp: (!) 25 (!) 29 (!) 26   Temp:    99 F (37.2 C)  TempSrc:    Oral  SpO2: 95% 97% 96%   Weight:      Height:        Constitutional: Resting in bed with head elevated, NAD, calm, comfortable Eyes: PERRL, lids and conjunctivae normal ENMT: Mucous membranes are moist. Posterior pharynx clear of any exudate or lesions.Normal dentition.  Neck: normal, supple, no masses. Respiratory: Faint bibasilar inspiratory crackles. Normal respiratory effort. No accessory muscle use.  Cardiovascular: Regular rate and rhythm, no murmurs / rubs / gallops. No extremity edema. 2+ pedal pulses. Abdomen: Midline surgical scar appears to be healing well, feeding tube in place left abdomen, bowel sounds positive.  Nontender to palpation. Musculoskeletal: no clubbing / cyanosis. No joint deformity upper and lower extremities. Good ROM, no contractures. Normal muscle tone.  Skin: no rashes, lesions, ulcers. No induration Neurologic: CN 2-12 grossly intact. Sensation intact, Strength 5/5 in all 4.  Psychiatric: Normal judgment and insight. Alert and oriented x 3. Normal mood.     Labs on Admission: I have personally reviewed following labs and imaging studies  CBC: Recent Labs  Lab 08/07/19 1526  WBC 6.7  NEUTROABS 6.5  HGB 9.1*  HCT 27.9*  MCV 92.4  PLT 824   Basic Metabolic Panel: Recent Labs  Lab 08/07/19 1526  NA 131*  K 3.3*  CL 100  CO2 21*  GLUCOSE 175*  BUN 23*  CREATININE 1.24  CALCIUM 7.9*   GFR: Estimated Creatinine Clearance: 79.7 mL/min (by C-G formula based on SCr of 1.24 mg/dL). Liver Function Tests: Recent Labs  Lab 08/07/19 1526  AST 27  ALT 23  ALKPHOS 262*  BILITOT 0.7  PROT 6.3*  ALBUMIN 2.6*   Recent Labs  Lab 08/07/19 1526  LIPASE 15   No results for input(s): AMMONIA in the last 168  hours. Coagulation Profile: No results for input(s): INR, PROTIME in the last 168 hours. Cardiac Enzymes: No results for input(s): CKTOTAL, CKMB, CKMBINDEX, TROPONINI in the last 168 hours. BNP (last 3 results) No results for input(s): PROBNP in the last 8760 hours. HbA1C: No results for input(s): HGBA1C in the last 72 hours. CBG: Recent Labs  Lab 08/07/19 1505  GLUCAP 177*   Lipid Profile: No  results for input(s): CHOL, HDL, LDLCALC, TRIG, CHOLHDL, LDLDIRECT in the last 72 hours. Thyroid Function Tests: No results for input(s): TSH, T4TOTAL, FREET4, T3FREE, THYROIDAB in the last 72 hours. Anemia Panel: No results for input(s): VITAMINB12, FOLATE, FERRITIN, TIBC, IRON, RETICCTPCT in the last 72 hours. Urine analysis:    Component Value Date/Time   COLORURINE AMBER (A) 07/24/2019 0857   APPEARANCEUR HAZY (A) 07/24/2019 0857   LABSPEC 1.023 07/24/2019 0857   PHURINE 5.0 07/24/2019 0857   GLUCOSEU 150 (A) 07/24/2019 0857   HGBUR SMALL (A) 07/24/2019 0857   BILIRUBINUR NEGATIVE 07/24/2019 [place] 07/24/2019 0857   PROTEINUR 100 (A) 07/24/2019 0857   NITRITE NEGATIVE 07/24/2019 0857   LEUKOCYTESUR NEGATIVE 07/24/2019 0857    Radiological Exams on Admission: Ct Angio Chest Pe W And/or Wo Contrast  Result Date: 08/07/2019 CLINICAL DATA:  Syncopal episode. History of pancreatic cancer status post Whipple procedure 3 weeks ago. EXAM: CT ANGIOGRAPHY CHEST CT ABDOMEN AND PELVIS WITH CONTRAST TECHNIQUE: Multidetector CT imaging of the chest was performed using the standard protocol during bolus administration of intravenous contrast. Multiplanar CT image reconstructions and MIPs were obtained to evaluate the vascular anatomy. Multidetector CT imaging of the abdomen and pelvis was performed using the standard protocol during bolus administration of intravenous contrast. CONTRAST:  137m OMNIPAQUE IOHEXOL 350 MG/ML SOLN COMPARISON:  CT chest, abdomen, and pelvis dated May 11, 2019. FINDINGS: CTA CHEST FINDINGS Cardiovascular: Suboptimal opacification of the pulmonary arteries. No central pulmonary embolism. Normal heart size. No pericardial effusion. Three-vessel coronary atherosclerosis. No thoracic aortic aneurysm or dissection. Unchanged left chest wall port catheter with tip at the cavoatrial junction. Mediastinum/Nodes: No enlarged mediastinal, hilar, or axillary lymph nodes. Thyroid gland, trachea, and esophagus demonstrate no significant findings. Lungs/Pleura: Dependent subsegmental atelectasis in both lower lobes. No focal consolidation, pleural effusion, or pneumothorax. No suspicious pulmonary nodule. Musculoskeletal: No chest wall abnormality. No acute or significant osseous findings. Review of the MIP images confirms the above findings. CT ABDOMEN AND PELVIS FINDINGS Hepatobiliary: No focal liver abnormality. Mild periportal edema. Interval cholecystectomy with choledochojejunostomy. 7.8 x 6.1 x 8.8 cm rim enhancing fluid collection inferior to the right liver. Additional 2.8 x 1.7 cm rim enhancing fluid collection with small focus of gas in the right upper quadrant just inferior to the falciform ligament. Pancreas: Interval resection of the pancreatic head and proximal body with pancreaticojejunostomy. Spleen: Normal in size without focal abnormality. Adrenals/Urinary Tract: The adrenal glands are unremarkable. Unchanged 1.9 cm right renal simple cyst. No renal calculi or hydronephrosis. Small foci of dependent air within the bladder likely related to recent instrumentation. No bladder wall thickening. Stomach/Bowel: Prior duodenectomy with gastrojejunostomy. Moderate wall thickening and pericolonic inflammatory changes involving the distal ascending, transverse, and descending colon. Moderate sigmoid diverticulosis. Normal appendix. Jejunostomy feeding tube in place. Vascular/Lymphatic: Aortic atherosclerosis. No enlarged abdominal or pelvic lymph nodes. Reproductive:  Prostate is unremarkable. Other: Unchanged small fat containing bilateral inguinal hernias. Trace ascites. No pneumoperitoneum. Musculoskeletal: Open upper abdominal midline wound. No acute or significant osseous findings. Review of the MIP images confirms the above findings. IMPRESSION: Chest: 1. Suboptimal evaluation of the pulmonary arteries. No central pulmonary embolism. If clinical concern remains high, consider V/Q scan for further evaluation. Abdomen and pelvis: 1. Postsurgical changes related to interval Whipple procedure with two rim enhancing fluid collections in the right upper quadrant measuring up to 7.8 x 6.1 x 8.8 cm, concerning for abscesses. 2. Moderate wall thickening and pericolonic inflammatory changes involving  the distal ascending, transverse, and descending colon, consistent with colitis. 3.  Aortic atherosclerosis (ICD10-I70.0). Electronically Signed   By: Titus Dubin M.D.   On: 08/07/2019 17:50   Ct Abdomen Pelvis W Contrast  Result Date: 08/07/2019 CLINICAL DATA:  Syncopal episode. History of pancreatic cancer status post Whipple procedure 3 weeks ago. EXAM: CT ANGIOGRAPHY CHEST CT ABDOMEN AND PELVIS WITH CONTRAST TECHNIQUE: Multidetector CT imaging of the chest was performed using the standard protocol during bolus administration of intravenous contrast. Multiplanar CT image reconstructions and MIPs were obtained to evaluate the vascular anatomy. Multidetector CT imaging of the abdomen and pelvis was performed using the standard protocol during bolus administration of intravenous contrast. CONTRAST:  164m OMNIPAQUE IOHEXOL 350 MG/ML SOLN COMPARISON:  CT chest, abdomen, and pelvis dated May 11, 2019. FINDINGS: CTA CHEST FINDINGS Cardiovascular: Suboptimal opacification of the pulmonary arteries. No central pulmonary embolism. Normal heart size. No pericardial effusion. Three-vessel coronary atherosclerosis. No thoracic aortic aneurysm or dissection. Unchanged left chest wall port  catheter with tip at the cavoatrial junction. Mediastinum/Nodes: No enlarged mediastinal, hilar, or axillary lymph nodes. Thyroid gland, trachea, and esophagus demonstrate no significant findings. Lungs/Pleura: Dependent subsegmental atelectasis in both lower lobes. No focal consolidation, pleural effusion, or pneumothorax. No suspicious pulmonary nodule. Musculoskeletal: No chest wall abnormality. No acute or significant osseous findings. Review of the MIP images confirms the above findings. CT ABDOMEN AND PELVIS FINDINGS Hepatobiliary: No focal liver abnormality. Mild periportal edema. Interval cholecystectomy with choledochojejunostomy. 7.8 x 6.1 x 8.8 cm rim enhancing fluid collection inferior to the right liver. Additional 2.8 x 1.7 cm rim enhancing fluid collection with small focus of gas in the right upper quadrant just inferior to the falciform ligament. Pancreas: Interval resection of the pancreatic head and proximal body with pancreaticojejunostomy. Spleen: Normal in size without focal abnormality. Adrenals/Urinary Tract: The adrenal glands are unremarkable. Unchanged 1.9 cm right renal simple cyst. No renal calculi or hydronephrosis. Small foci of dependent air within the bladder likely related to recent instrumentation. No bladder wall thickening. Stomach/Bowel: Prior duodenectomy with gastrojejunostomy. Moderate wall thickening and pericolonic inflammatory changes involving the distal ascending, transverse, and descending colon. Moderate sigmoid diverticulosis. Normal appendix. Jejunostomy feeding tube in place. Vascular/Lymphatic: Aortic atherosclerosis. No enlarged abdominal or pelvic lymph nodes. Reproductive: Prostate is unremarkable. Other: Unchanged small fat containing bilateral inguinal hernias. Trace ascites. No pneumoperitoneum. Musculoskeletal: Open upper abdominal midline wound. No acute or significant osseous findings. Review of the MIP images confirms the above findings. IMPRESSION: Chest:  1. Suboptimal evaluation of the pulmonary arteries. No central pulmonary embolism. If clinical concern remains high, consider V/Q scan for further evaluation. Abdomen and pelvis: 1. Postsurgical changes related to interval Whipple procedure with two rim enhancing fluid collections in the right upper quadrant measuring up to 7.8 x 6.1 x 8.8 cm, concerning for abscesses. 2. Moderate wall thickening and pericolonic inflammatory changes involving the distal ascending, transverse, and descending colon, consistent with colitis. 3.  Aortic atherosclerosis (ICD10-I70.0). Electronically Signed   By: WTitus DubinM.D.   On: 08/07/2019 17:50   Dg Chest Port 1 View  Result Date: 08/07/2019 CLINICAL DATA:  Syncopal episode. EXAM: PORTABLE CHEST 1 VIEW COMPARISON:  Chest CT 08/07/2019 FINDINGS: The cardiac silhouette, mediastinal and hilar contours are within normal limits and stable. The lungs are clear. No pleural effusion. Left subclavian power port in good position, unchanged. The bony thorax is intact. IMPRESSION: No acute cardiopulmonary findings. Electronically Signed   By: PMarijo SanesM.D.   On:  08/07/2019 17:02    EKG: Independently reviewed. Sinus tachycardia with PVCs, PVCs new when compared to prior.  Assessment/Plan Principal Problem:   Postprocedural intraabdominal abscess Active Problems:   DM2 (diabetes mellitus, type 2) ([facility])   HTN (hypertension)   Anemia   Cancer of head of pancreas ([place])   Sepsis ([place])   Hypokalemia   History of deep vein thrombosis (DVT) of lower extremity  Brian Torres is a 58 y.o. male with medical history significant for pancreatic cancer s/p pancreaticoduodenectomy 07/20/2019, IDT2DM, HTN, HLD, Anemia, Hx of LLE DVT on Eliquis who is admitted with intra-abdominal abscess.   Sepsis due to suspected intra-abdominal abscess: RUQ enhancing fluid collection seen on CT scan concerning for abscesses.  Patient given IV antibiotics in the ED.  General surgery  consulted and Dr. Barry Dienes will evaluate patient tomorrow.  May need IR involvement for drainage. -Continue antibiotics with IV Zosyn, follow blood cultures -Continue IV fluid resuscitation -Will make n.p.o. at midnight -Hold home Eliquis -General surgery following  Colitis: Seen on CT imaging.  Obtain C. difficile test if patient has frequent watery diarrhea.  Pancreatic cancer s/p pancreaticoduodenectomy 07/20/2019: Follows with oncology, Dr. Benay Spice, and has completed neoadjuvant FOLFIRINOX therapy.  General surgery following.  Insulin-dependent type 2 diabetes: Home regimen includes metformin, pioglitazone, Humalog, and Basaglar. -Reduced home Basaglar nightly plus sensitive SSI while in hospital, adjust as needed  History of LLE DVT: Hold Eliquis for potential intervention.  Hypokalemia: Replete and monitor.  Magnesium 1.2, will replete and recheck in a.m.  Hypertension: Hypotensive on admission.  Hold home lisinopril and continue IV fluid resuscitation.  Anemia: Appears stable relative to recent baseline.  No obvious bleeding.  Holding Eliquis as above.   DVT prophylaxis: SCDs Code Status: Full code, confirmed with patient Family Communication: Discussed with wife at bedside. Disposition Plan: Pending clinical progress Consults called: General surgery Admission status: Inpatient, patient likely requires greater than 2 midnight length stay for further evaluation and management of suspected intra-abdominal abscesses requiring IV antibiotics and general surgery and likely IR consultation and intervention.   Zada Finders MD [facility]  If 7PM-7AM, please contact night-coverage [email or web address]  08/07/2019, 8:06 PM

## 2019-08-07 NOTE — ED Notes (Signed)
Dr. Patel at bedside 

## 2019-08-07 NOTE — ED Notes (Signed)
Patient is unable to make urine.

## 2019-08-08 ENCOUNTER — Encounter (HOSPITAL_COMMUNITY): Payer: Self-pay

## 2019-08-08 DIAGNOSIS — N1831 Chronic kidney disease, stage 3a: Secondary | ICD-10-CM

## 2019-08-08 DIAGNOSIS — A419 Sepsis, unspecified organism: Secondary | ICD-10-CM

## 2019-08-08 DIAGNOSIS — Z794 Long term (current) use of insulin: Secondary | ICD-10-CM

## 2019-08-08 DIAGNOSIS — D649 Anemia, unspecified: Secondary | ICD-10-CM

## 2019-08-08 DIAGNOSIS — C250 Malignant neoplasm of head of pancreas: Secondary | ICD-10-CM

## 2019-08-08 DIAGNOSIS — I10 Essential (primary) hypertension: Secondary | ICD-10-CM

## 2019-08-08 DIAGNOSIS — E1121 Type 2 diabetes mellitus with diabetic nephropathy: Secondary | ICD-10-CM

## 2019-08-08 DIAGNOSIS — K529 Noninfective gastroenteritis and colitis, unspecified: Secondary | ICD-10-CM

## 2019-08-08 LAB — CBC
HCT: 22.1 % — ABNORMAL LOW (ref 39.0–52.0)
Hemoglobin: 7 g/dL — ABNORMAL LOW (ref 13.0–17.0)
MCH: 29.8 pg (ref 26.0–34.0)
MCHC: 31.7 g/dL (ref 30.0–36.0)
MCV: 94 fL (ref 80.0–100.0)
Platelets: 139 10*3/uL — ABNORMAL LOW (ref 150–400)
RBC: 2.35 MIL/uL — ABNORMAL LOW (ref 4.22–5.81)
RDW: 14.7 % (ref 11.5–15.5)
WBC: 10.8 10*3/uL — ABNORMAL HIGH (ref 4.0–10.5)
nRBC: 0 % (ref 0.0–0.2)

## 2019-08-08 LAB — GLUCOSE, CAPILLARY
Glucose-Capillary: 104 mg/dL — ABNORMAL HIGH (ref 70–99)
Glucose-Capillary: 174 mg/dL — ABNORMAL HIGH (ref 70–99)
Glucose-Capillary: 180 mg/dL — ABNORMAL HIGH (ref 70–99)
Glucose-Capillary: 192 mg/dL — ABNORMAL HIGH (ref 70–99)
Glucose-Capillary: 211 mg/dL — ABNORMAL HIGH (ref 70–99)
Glucose-Capillary: 92 mg/dL (ref 70–99)
Glucose-Capillary: 96 mg/dL (ref 70–99)

## 2019-08-08 LAB — URINE CULTURE: Culture: 50000 — AB

## 2019-08-08 LAB — URINALYSIS, ROUTINE W REFLEX MICROSCOPIC
Bilirubin Urine: NEGATIVE
Glucose, UA: NEGATIVE mg/dL
Hgb urine dipstick: NEGATIVE
Ketones, ur: 5 mg/dL — AB
Leukocytes,Ua: NEGATIVE
Nitrite: NEGATIVE
Protein, ur: 30 mg/dL — AB
Specific Gravity, Urine: >1.046 — ABNORMAL HIGH (ref 1.005–1.030)
pH: 5 (ref 5.0–8.0)

## 2019-08-08 LAB — BASIC METABOLIC PANEL
Anion gap: 8 (ref 5–15)
BUN: 24 mg/dL — ABNORMAL HIGH (ref 6–20)
CO2: 21 mmol/L — ABNORMAL LOW (ref 22–32)
Calcium: 7.2 mg/dL — ABNORMAL LOW (ref 8.9–10.3)
Chloride: 106 mmol/L (ref 98–111)
Creatinine, Ser: 1.25 mg/dL — ABNORMAL HIGH (ref 0.61–1.24)
GFR calc Af Amer: >60 mL/min (ref >60)
GFR calc non Af Amer: >60 mL/min (ref >60)
Glucose, Bld: 163 mg/dL — ABNORMAL HIGH (ref 70–99)
Potassium: 4.2 mmol/L (ref 3.5–5.1)
Sodium: 135 mmol/L (ref 135–145)

## 2019-08-08 LAB — HEMOGLOBIN AND HEMATOCRIT, BLOOD
HCT: 21.7 % — ABNORMAL LOW (ref 39.0–52.0)
HCT: 24.1 % — ABNORMAL LOW (ref 39.0–52.0)
Hemoglobin: 7 g/dL — ABNORMAL LOW (ref 13.0–17.0)
Hemoglobin: 7.7 g/dL — ABNORMAL LOW (ref 13.0–17.0)

## 2019-08-08 LAB — C DIFFICILE QUICK SCREEN W PCR REFLEX
C Diff antigen: NEGATIVE
C Diff interpretation: NOT DETECTED
C Diff toxin: NEGATIVE

## 2019-08-08 LAB — PREPARE RBC (CROSSMATCH)

## 2019-08-08 LAB — MAGNESIUM: Magnesium: 1.9 mg/dL (ref 1.7–2.4)

## 2019-08-08 MED ORDER — SODIUM CHLORIDE 0.9% FLUSH
10.0000 mL | INTRAVENOUS | Status: DC | PRN
  Administered 2019-08-11: 11:00:00 10 mL
  Filled 2019-08-08: qty 40

## 2019-08-08 MED ORDER — NICOTINE 7 MG/24HR TD PT24
7.0000 mg | MEDICATED_PATCH | Freq: Every day | TRANSDERMAL | Status: DC
  Administered 2019-08-08 – 2019-08-11 (×4): 7 mg via TRANSDERMAL
  Filled 2019-08-08 (×4): qty 1

## 2019-08-08 MED ORDER — SODIUM CHLORIDE 0.9% IV SOLUTION
Freq: Once | INTRAVENOUS | Status: AC
  Administered 2019-08-08: 17:00:00 via INTRAVENOUS

## 2019-08-08 NOTE — Progress Notes (Signed)
Updated patient's wife over the phone. Hgb 7.0. Will transfuse.

## 2019-08-08 NOTE — Progress Notes (Signed)
Pharmacy Antibiotic Note  Brian Torres is a 58 y.o. male admitted on 08/07/2019 with Intra-abdominal infection.  Pharmacy has been consulted for zosyn dosing.  Plan: Zosyn 3.375g IV q8h (4 hour infusion).  Height: 6\' 4"  (193 cm) Weight: 203 lb 7.8 oz (92.3 kg) IBW/kg (Calculated) : 86.8  Temp (24hrs), Avg:99.6 F (37.6 C), Min:98 F (36.7 C), Max:101.6 F (38.7 C)  Recent Labs  Lab 08/07/19 1526 08/07/19 1745  WBC 6.7  --   CREATININE 1.24  --   LATICACIDVEN 3.0* 1.6    Estimated Creatinine Clearance: 79.7 mL/min (by C-G formula based on SCr of 1.24 mg/dL).    No Known Allergies  Antimicrobials this admission: Vancomycin 08/07/2019 x1 Cefepime 08/07/2019 x1 Zosyn 08/07/2019 >>   Dose adjustments this admission: -  Microbiology results: -  Thank you for allowing pharmacy to be a part of this patient's care.  Nani Skillern Crowford 08/08/2019 4:36 AM

## 2019-08-08 NOTE — Progress Notes (Signed)
Subjective/Chief Complaint: Readmitted for probable abscess.  Had a near syncopal episode.  CT showed fluid collection and possible abscess.    Objective: Vital signs in last 24 hours: Temp:  [97.7 F (36.5 C)-101.6 F (38.7 C)] 98.2 F (36.8 C) (10/06 1200) Pulse Rate:  [75-110] 78 (10/06 0819) Resp:  [15-29] 27 (10/06 0819) BP: (77-103)/(47-62) 102/56 (10/06 0819) SpO2:  [95 %-99 %] 97 % (10/06 0819) Weight:  [92.3 kg] 92.3 kg (10/06 0500) Last BM Date: 08/08/19  Intake/Output from previous day: 10/05 0701 - 10/06 0700 In: 2973 [I.V.:829.5; IV Piggyback:2143.5] Out: 300 [Urine:300] Intake/Output this shift: Total I/O In: 351.3 [I.V.:286; IV Piggyback:65.3] Out: -   Constitutional: alert and oriented.  Lungs: Normal respiratory effort CV: regular rate and rhythm, no peripheral edema GI: soft, approp tender. Non distended.   Skin: No rashes, palpation reveals normal turgor Psychiatric: appropriate judgment and insight, oriented to person, place, and time   Lab Results:  Recent Labs    08/07/19 1526 08/08/19 0455 08/08/19 1200  WBC 6.7 10.8*  --   HGB 9.1* 7.0* 7.0*  HCT 27.9* 22.1* 21.7*  PLT 193 139*  --    BMET Recent Labs    08/07/19 1526 08/08/19 0455  NA 131* 135  K 3.3* 4.2  CL 100 106  CO2 21* 21*  GLUCOSE 175* 163*  BUN 23* 24*  CREATININE 1.24 1.25*  CALCIUM 7.9* 7.2*   PT/INR No results for input(s): LABPROT, INR in the last 72 hours. ABG No results for input(s): PHART, HCO3 in the last 72 hours.  Invalid input(s): PCO2, PO2  Studies/Results: Ct Angio Chest Pe W And/or Wo Contrast  Result Date: 08/07/2019 CLINICAL DATA:  Syncopal episode. History of pancreatic cancer status post Whipple procedure 3 weeks ago. EXAM: CT ANGIOGRAPHY CHEST CT ABDOMEN AND PELVIS WITH CONTRAST TECHNIQUE: Multidetector CT imaging of the chest was performed using the standard protocol during bolus administration of intravenous contrast. Multiplanar CT image  reconstructions and MIPs were obtained to evaluate the vascular anatomy. Multidetector CT imaging of the abdomen and pelvis was performed using the standard protocol during bolus administration of intravenous contrast. CONTRAST:  120mL OMNIPAQUE IOHEXOL 350 MG/ML SOLN COMPARISON:  CT chest, abdomen, and pelvis dated May 11, 2019. FINDINGS: CTA CHEST FINDINGS Cardiovascular: Suboptimal opacification of the pulmonary arteries. No central pulmonary embolism. Normal heart size. No pericardial effusion. Three-vessel coronary atherosclerosis. No thoracic aortic aneurysm or dissection. Unchanged left chest wall port catheter with tip at the cavoatrial junction. Mediastinum/Nodes: No enlarged mediastinal, hilar, or axillary lymph nodes. Thyroid gland, trachea, and esophagus demonstrate no significant findings. Lungs/Pleura: Dependent subsegmental atelectasis in both lower lobes. No focal consolidation, pleural effusion, or pneumothorax. No suspicious pulmonary nodule. Musculoskeletal: No chest wall abnormality. No acute or significant osseous findings. Review of the MIP images confirms the above findings. CT ABDOMEN AND PELVIS FINDINGS Hepatobiliary: No focal liver abnormality. Mild periportal edema. Interval cholecystectomy with choledochojejunostomy. 7.8 x 6.1 x 8.8 cm rim enhancing fluid collection inferior to the right liver. Additional 2.8 x 1.7 cm rim enhancing fluid collection with small focus of gas in the right upper quadrant just inferior to the falciform ligament. Pancreas: Interval resection of the pancreatic head and proximal body with pancreaticojejunostomy. Spleen: Normal in size without focal abnormality. Adrenals/Urinary Tract: The adrenal glands are unremarkable. Unchanged 1.9 cm right renal simple cyst. No renal calculi or hydronephrosis. Small foci of dependent air within the bladder likely related to recent instrumentation. No bladder wall thickening. Stomach/Bowel: Prior  duodenectomy with  gastrojejunostomy. Moderate wall thickening and pericolonic inflammatory changes involving the distal ascending, transverse, and descending colon. Moderate sigmoid diverticulosis. Normal appendix. Jejunostomy feeding tube in place. Vascular/Lymphatic: Aortic atherosclerosis. No enlarged abdominal or pelvic lymph nodes. Reproductive: Prostate is unremarkable. Other: Unchanged small fat containing bilateral inguinal hernias. Trace ascites. No pneumoperitoneum. Musculoskeletal: Open upper abdominal midline wound. No acute or significant osseous findings. Review of the MIP images confirms the above findings. IMPRESSION: Chest: 1. Suboptimal evaluation of the pulmonary arteries. No central pulmonary embolism. If clinical concern remains high, consider V/Q scan for further evaluation. Abdomen and pelvis: 1. Postsurgical changes related to interval Whipple procedure with two rim enhancing fluid collections in the right upper quadrant measuring up to 7.8 x 6.1 x 8.8 cm, concerning for abscesses. 2. Moderate wall thickening and pericolonic inflammatory changes involving the distal ascending, transverse, and descending colon, consistent with colitis. 3.  Aortic atherosclerosis (ICD10-I70.0). Electronically Signed   By: Titus Dubin M.D.   On: 08/07/2019 17:50   Ct Abdomen Pelvis W Contrast  Result Date: 08/07/2019 CLINICAL DATA:  Syncopal episode. History of pancreatic cancer status post Whipple procedure 3 weeks ago. EXAM: CT ANGIOGRAPHY CHEST CT ABDOMEN AND PELVIS WITH CONTRAST TECHNIQUE: Multidetector CT imaging of the chest was performed using the standard protocol during bolus administration of intravenous contrast. Multiplanar CT image reconstructions and MIPs were obtained to evaluate the vascular anatomy. Multidetector CT imaging of the abdomen and pelvis was performed using the standard protocol during bolus administration of intravenous contrast. CONTRAST:  164mL OMNIPAQUE IOHEXOL 350 MG/ML SOLN COMPARISON:   CT chest, abdomen, and pelvis dated May 11, 2019. FINDINGS: CTA CHEST FINDINGS Cardiovascular: Suboptimal opacification of the pulmonary arteries. No central pulmonary embolism. Normal heart size. No pericardial effusion. Three-vessel coronary atherosclerosis. No thoracic aortic aneurysm or dissection. Unchanged left chest wall port catheter with tip at the cavoatrial junction. Mediastinum/Nodes: No enlarged mediastinal, hilar, or axillary lymph nodes. Thyroid gland, trachea, and esophagus demonstrate no significant findings. Lungs/Pleura: Dependent subsegmental atelectasis in both lower lobes. No focal consolidation, pleural effusion, or pneumothorax. No suspicious pulmonary nodule. Musculoskeletal: No chest wall abnormality. No acute or significant osseous findings. Review of the MIP images confirms the above findings. CT ABDOMEN AND PELVIS FINDINGS Hepatobiliary: No focal liver abnormality. Mild periportal edema. Interval cholecystectomy with choledochojejunostomy. 7.8 x 6.1 x 8.8 cm rim enhancing fluid collection inferior to the right liver. Additional 2.8 x 1.7 cm rim enhancing fluid collection with small focus of gas in the right upper quadrant just inferior to the falciform ligament. Pancreas: Interval resection of the pancreatic head and proximal body with pancreaticojejunostomy. Spleen: Normal in size without focal abnormality. Adrenals/Urinary Tract: The adrenal glands are unremarkable. Unchanged 1.9 cm right renal simple cyst. No renal calculi or hydronephrosis. Small foci of dependent air within the bladder likely related to recent instrumentation. No bladder wall thickening. Stomach/Bowel: Prior duodenectomy with gastrojejunostomy. Moderate wall thickening and pericolonic inflammatory changes involving the distal ascending, transverse, and descending colon. Moderate sigmoid diverticulosis. Normal appendix. Jejunostomy feeding tube in place. Vascular/Lymphatic: Aortic atherosclerosis. No enlarged  abdominal or pelvic lymph nodes. Reproductive: Prostate is unremarkable. Other: Unchanged small fat containing bilateral inguinal hernias. Trace ascites. No pneumoperitoneum. Musculoskeletal: Open upper abdominal midline wound. No acute or significant osseous findings. Review of the MIP images confirms the above findings. IMPRESSION: Chest: 1. Suboptimal evaluation of the pulmonary arteries. No central pulmonary embolism. If clinical concern remains high, consider V/Q scan for further evaluation. Abdomen and pelvis: 1. Postsurgical changes  related to interval Whipple procedure with two rim enhancing fluid collections in the right upper quadrant measuring up to 7.8 x 6.1 x 8.8 cm, concerning for abscesses. 2. Moderate wall thickening and pericolonic inflammatory changes involving the distal ascending, transverse, and descending colon, consistent with colitis. 3.  Aortic atherosclerosis (ICD10-I70.0). Electronically Signed   By: Titus Dubin M.D.   On: 08/07/2019 17:50   Dg Chest Port 1 View  Result Date: 08/07/2019 CLINICAL DATA:  Syncopal episode. EXAM: PORTABLE CHEST 1 VIEW COMPARISON:  Chest CT 08/07/2019 FINDINGS: The cardiac silhouette, mediastinal and hilar contours are within normal limits and stable. The lungs are clear. No pleural effusion. Left subclavian power port in good position, unchanged. The bony thorax is intact. IMPRESSION: No acute cardiopulmonary findings. Electronically Signed   By: Marijo Sanes M.D.   On: 08/07/2019 17:02    Anti-infectives: Anti-infectives (From admission, onward)   Start     Dose/Rate Route Frequency Ordered Stop   08/07/19 2200  piperacillin-tazobactam (ZOSYN) IVPB 3.375 g     3.375 g 12.5 mL/hr over 240 Minutes Intravenous Every 8 hours 08/07/19 2127     08/07/19 1600  vancomycin (VANCOCIN) 2,000 mg in sodium chloride 0.9 % 500 mL IVPB     2,000 mg 250 mL/hr over 120 Minutes Intravenous  Once 08/07/19 1539 08/07/19 2020   08/07/19 1545  ceFEPIme  (MAXIPIME) 2 g in sodium chloride 0.9 % 100 mL IVPB     2 g 200 mL/hr over 30 Minutes Intravenous  Once 08/07/19 1539 08/07/19 1956   08/07/19 1530  metroNIDAZOLE (FLAGYL) IVPB 500 mg     500 mg 100 mL/hr over 60 Minutes Intravenous  Once 08/07/19 1527 08/07/19 1956   08/07/19 1530  vancomycin (VANCOCIN) IVPB 1000 mg/200 mL premix  Status:  Discontinued     1,000 mg 200 mL/hr over 60 Minutes Intravenous  Once 08/07/19 1527 08/07/19 1539      Assessment/Plan: S/p whipple/J tube 07/20/2019  P/o course complicated by wound infection.    Went home with 1 drain that was a bit murky.  Cleared up and output  minimal so pulled last week.   Medical history includes DM, chronic kidney dx, h/o LLE DVT, hypertension  Here with sepsis and colitis.    Have asked IR to drain fluid collection.   On zosyn that cover colitis and biliary flora    LOS: 1 day    [name] 08/08/2019

## 2019-08-08 NOTE — Progress Notes (Signed)
Downward trend of HGB was noticed. 10/05 1526- HGB was 9.1 As of 08/08/2019 0455, HGB was 7.0 K.Kirby, MD was notified. No new orders at time.

## 2019-08-08 NOTE — TOC Initial Note (Signed)
Transition of Care [facility]) - Initial/Assessment Note    Patient Details  Name: ARDELL HEGGE MRN: [id number] Date of Birth: 1961/08/17  Transition of Care [facility]) CM/SW Contact:    Kimya Mccahill, Marjie Skiff, RN Phone [phone] 08/08/2019, 2:24 PM  Clinical Narrative:                   Expected Discharge Plan: Home/Self Care Barriers to Discharge: Continued Medical Work up   Patient Goals and CMS Choice        Expected Discharge Plan and Services Expected Discharge Plan: Home/Self Care   Discharge Planning Services: CM Consult                                          Prior Living Arrangements/Services   Lives with:: Spouse Patient language and need for interpreter reviewed:: Yes        Need for Family Participation in Patient Care: Yes (Comment) Care giver support system in place?: Yes (comment)   Criminal Activity/Legal Involvement Pertinent to Current Situation/Hospitalization: No - Comment as needed  Activities of Daily Living Home Assistive Devices/Equipment: CBG Meter, Walker (specify type)(front wheeled walker) ADL Screening (condition at time of admission) Patient's cognitive ability adequate to safely complete daily activities?: Yes Is the patient deaf or have difficulty hearing?: No Does the patient have difficulty seeing, even when wearing glasses/contacts?: Yes(cataract in right eye-vision blurry in that eye) Does the patient have difficulty concentrating, remembering, or making decisions?: No Patient able to express need for assistance with ADLs?: Yes Does the patient have difficulty dressing or bathing?: No Independently performs ADLs?: No Communication: Independent Dressing (OT): Independent Grooming: Independent Feeding: Independent Bathing: Independent Toileting: Needs assistance Is this a change from baseline?: Change from baseline, expected to last >3days In/Out Bed: Needs assistance Is this a change from baseline?: Change from  baseline, expected to last >3 days Walks in Home: Needs assistance Is this a change from baseline?: Change from baseline, expected to last >3 days Does the patient have difficulty walking or climbing stairs?: Yes(secondary to weakness) Weakness of Legs: Both Weakness of Arms/Hands: None  Permission Sought/Granted                  Emotional Assessment              Admission diagnosis:  Postprocedural intraabdominal abscess [T81.43XA] Sepsis, due to unspecified organism, unspecified whether acute organ dysfunction present [facility]) [A41.9] Patient Active Problem List   Diagnosis Date Noted  . Postprocedural intraabdominal abscess 08/07/2019  . Sepsis ([place]) 08/07/2019  . Hypokalemia 08/07/2019  . History of deep vein thrombosis (DVT) of lower extremity 08/07/2019  . Pancreatic cancer ([place]) 07/20/2019  . Adenocarcinoma of head of pancreas ([place]) 07/20/2019  . Genetic testing 05/16/2019  . Family history of melanoma   . Family history of breast cancer   . Port-A-Cath in place 02/20/2019  . Cancer of head of pancreas ([place]) 01/26/2019  . Pancreatic mass 12/19/2018  . Tobacco abuse 12/19/2018  . Elevated LFTs 12/19/2018  . Hyponatremia 12/19/2018  . Anemia 10/19/2017  . Thrombocytopenia ([place]) 10/19/2017  . Leg DVT (deep venous thromboembolism), acute, left ([place]) 10/15/2017  . DM2 (diabetes mellitus, type 2) ([place]) 10/15/2017  . HTN (hypertension) 10/15/2017  . CKD stage 3 due to type 2 diabetes mellitus ([place]) 10/15/2017   PCP:  Cari Caraway, MD Pharmacy:   [id number] -  [place], [place] [address] Phone: [phone] Fax: [phone]     Social Determinants of Health (SDOH) Interventions    Readmission Risk Interventions Readmission Risk Prevention Plan 08/08/2019  Transportation Screening Complete  Medication Review Press photographer) Complete  PCP or Specialist appointment within 3-5 days of  discharge Not Complete  PCP/Specialist Appt Not Complete comments Not ready for dc  HRI or [place] Not Complete  HRI or Home Care Consult Pt Refusal Comments NA  SW Recovery Care/Counseling Consult Not Complete  SW Consult Not Complete Comments NA  Palliative Care Screening Not [place] Not Applicable  Some recent data might be hidden

## 2019-08-08 NOTE — Progress Notes (Signed)
Initial Nutrition Assessment  DOCUMENTATION CODES:   Not applicable  INTERVENTION:  - diet advancement as medically feasible. - if diet unable to be advanced beyond liquids, recommend TF via J-port. - recommendation for TF: Pivot 1.5 @ 30 ml/hr to advance by 10 ml every 8 hours to goal rate of 70 ml/hr.  - at goal rate, this regimen will provides 2520 kcal (97% estimated kcal need), 157 grams protein, and 1275 ml free water.    NUTRITION DIAGNOSIS:   Increased nutrient needs related to post-op healing, cancer and cancer related treatments as evidenced by estimated needs.  GOAL:   Patient will meet greater than or equal to 90% of their needs  MONITOR:   Diet advancement, Labs, Weight trends  REASON FOR ASSESSMENT:   Malnutrition Screening Tool  ASSESSMENT:   58 y.o. male with medical history significant for pancreatic cancer s/p Whipple on 07/20/2019, insulin-dependent type 2 DM, HTN, HLD, anemia, and LLE DVT on Eliquis. He presented to the ED on 10/5 for evaluation of nausea, vomiting, and near syncopal episode. He was subsequently admitted for sepsis d/t post-surgical intra-abdominal fluid collection concerning for abscess and colitis. C.diff negative. General Surgery consulted.  Patient was discharged home from [facility] on 9/26 following Whipple on 9/17. At time of d/c, he was on a Regular diet and off of all TF. Patient was last seen by a [facility] RD on 9/26 and diet was extensively discussed with him and his wife, per RD note.   Patient has been NPO since admission. Wife at bedside and patient resting in bed at the time of RD visit. They both provide information/contribute to history. They feel that diet information was not outlined for them and they were unsure of how to proceed once they got home. Wife reports mainly fixing items that patient had had in the hospital and that he tolerated well. The best meal of the day was breakfast and intakes would decline as the day progressed.  Especially in the past 4-5 days he was able to eat bites only at dinner because he began to feel nauseated for unknown reason each evening. Nausea began prior to eating dinner or taking evening meds.   Briefly discussed nutrition needs with patient and his wife, some changes that may be of benefit given recent surgery, and that further nutrition education can be provided prior to d/c as medical course is unclear at this time and patient remains NPO.   They report that patient was taking prostat orally at home and was not using G-J tube for anything. He was prescribed a dissolvable anti-emetic for home which worked fairly well; RD let MD know.   Per chart review, current weight is 203 lb and weight on 9/9 was 208 lb. This indicates 5 lb weight loss (2.4% body weight) in the past 1 month; not significant for time frame. He does not currently meet criteria for malnutrition but is at increased/high risk.    Labs reviewed; CBGs: 180 and 104 mg/dl, BUN: 24 mg/dl, creatinine: 1.25 mg/dl, Ca: 7.2 mg/dl. Medications reviewed; 325 mg ferrous sulfate BID, sliding scale novolog, 8 units lantus/day, 2 g IV Mg sulfate x1 run 10/5, 60 mEq oral KCl x1 dose 10/5.       NUTRITION - FOCUSED PHYSICAL EXAM:    Most Recent Value  Orbital Region  No depletion  Upper Arm Region  No depletion  Thoracic and Lumbar Region  Unable to assess  Buccal Region  Mild depletion  Temple Region  No  depletion  Clavicle Bone Region  Mild depletion  Clavicle and Acromion Bone Region  Mild depletion  Scapular Bone Region  No depletion  Dorsal Hand  No depletion  Patellar Region  No depletion  Anterior Thigh Region  Unable to assess  Posterior Calf Region  No depletion  Edema (RD Assessment)  None  Hair  Reviewed  Eyes  Reviewed  Mouth  Reviewed  Skin  Reviewed  Nails  Reviewed       Diet Order:   Diet Order            Diet NPO time specified  Diet effective now              EDUCATION NEEDS:   No education  needs have been identified at this time  Skin:  Skin Assessment: Reviewed RN Assessment  Last BM:  10/6  Height:   Ht Readings from Last 1 Encounters:  08/07/19 6\' 4"  (1.93 m)    Weight:   Wt Readings from Last 1 Encounters:  08/08/19 92.3 kg    Ideal Body Weight:  91.8 kg  BMI:  Body mass index is 24.77 kg/m.  Estimated Nutritional Needs:   Kcal:  [id number] kcal  Protein:  138-157 grams  Fluid:  >/= 2.5 L/day     Jarome Matin, MS, RD, LDN, [facility] Inpatient Clinical Dietitian Pager # [phone] After hours/weekend pager # [phone]

## 2019-08-08 NOTE — Progress Notes (Signed)
Chief Complaint: Patient was seen in consultation today for intra-abdominal abscess at the request of Dr. [name]  Referring Physician(s): Dr. [name]  Supervising Physician: Sandi Mariscal  Patient Status: [facility] - Out-pt  History of Present Illness: Brian Torres is a 58 y.o. male s/p recent Whipple procedure for pancreatic cancer on 9/17. Recovered well initially but past few days just hasn't felt right. Developed some N/V and near syncopal episode. Has been admitted and CT of the abdomen finds large intra-abdominal abscess. He has been stabilized and is on IV abx but IR is now requested for perc drain of the abscess. PMHx, meds, labs, imaging, allergies reviewed. 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 Feels well, no recent fevers, chills, illness. Has been NPO today as directed. Family at bedside.   Past Medical History:  Diagnosis Date  . Cancer ([place]) 12/2018   pancreatic cancer  . Depression   . Diabetes mellitus without complication ([place])    type 2  . Dyslipidemia   . Erectile dysfunction   . Family history of breast cancer   . Family history of melanoma   . Hypertension    white coat syndrome  . Nicotine addiction     Past Surgical History:  Procedure Laterality Date  . BILIARY STENT PLACEMENT N/A 12/21/2018   Procedure: BILIARY STENT PLACEMENT;  Surgeon: Clarene Essex, MD;  Location: [facility];  Service: Endoscopy;  Laterality: N/A;  . BILIARY STENT PLACEMENT N/A 01/24/2019   Procedure: BILIARY STENT PLACEMENT;  Surgeon: Arta Silence, MD;  [facility];  Service: Endoscopy;  Laterality: N/A;  . BIOPSY  12/21/2018   Procedure: BIOPSY;  Surgeon: Clarene Essex, MD;  [facility];  Service: Endoscopy;;  . ENDOSCOPIC RETROGRADE CHOLANGIOPANCREATOGRAPHY (ERCP) WITH PROPOFOL N/A 12/21/2018   Procedure: ENDOSCOPIC RETROGRADE CHOLANGIOPANCREATOGRAPHY (ERCP) WITH PROPOFOL;  Surgeon: Clarene Essex, MD;  [facility];  Service: Endoscopy;  Laterality: N/A;  . ENDOSCOPIC RETROGRADE CHOLANGIOPANCREATOGRAPHY (ERCP) WITH PROPOFOL N/A 01/24/2019   Procedure: ENDOSCOPIC RETROGRADE CHOLANGIOPANCREATOGRAPHY (ERCP) WITH PROPOFOL;  Surgeon: Arta Silence, MD;  [facility];  Service: Endoscopy;  Laterality: N/A;  Balloon Sweep of Duct  . ESOPHAGOGASTRODUODENOSCOPY N/A 01/03/2019   Procedure: ESOPHAGOGASTRODUODENOSCOPY (EGD);  Surgeon: Arta Silence, MD;  Location: [name];  Service: Endoscopy;  Laterality: N/A;  . ESOPHAGOGASTRODUODENOSCOPY (EGD) WITH PROPOFOL N/A 01/24/2019   Procedure: ESOPHAGOGASTRODUODENOSCOPY (EGD) WITH PROPOFOL;  Surgeon: Arta Silence, MD;  [facility];  Service: Endoscopy;  Laterality: N/A;  stent removal  . EUS N/A 01/03/2019   Procedure: FULL UPPER ENDOSCOPIC ULTRASOUND (EUS) Linear not radial;  Surgeon: Arta Silence, MD;  [facility];  Service: Endoscopy;  Laterality: N/A;  . EUS N/A 01/24/2019   Procedure: FULL UPPER ENDOSCOPIC ULTRASOUND (EUS) RADIAL;  Surgeon: Arta Silence, MD;  [facility];  Service: Endoscopy;  Laterality: N/A;  . FINE NEEDLE ASPIRATION N/A 01/03/2019   Procedure: FINE NEEDLE ASPIRATION (FNA) LINEAR;  Surgeon: Arta Silence, MD;  [facility];  Service: Endoscopy;  Laterality: N/A;  . FINE NEEDLE ASPIRATION N/A 01/24/2019   Procedure: FINE NEEDLE ASPIRATION (FNA) LINEAR;  Surgeon: Arta Silence, MD;  [facility];  Service: Endoscopy;  Laterality: N/A;  . LAPAROSCOPY N/A 07/20/2019   Procedure: LAPAROSCOPY DIAGNOSTIC;  Surgeon: [name], MD;  Location: [place];  Service: General;  Laterality: N/A;  . LOWER EXTREMITY ANGIOGRAPHY N/A 10/18/2017   Procedure: LOWER EXTREMITY ANGIOGRAPHY - IVUS - LYSIS CATH;  Surgeon: Servando Snare  Harrell Gave, MD;  Location: [place] [facility];  Service: Cardiovascular;  Laterality: N/A;  . LOWER EXTREMITY ANGIOGRAPHY N/A 10/19/2017   Procedure: LOWER  EXTREMITY ANGIOGRAPHY - LYSIS RECHECK;  Surgeon: Waynetta Sandy, MD;  Location: [place] [facility];  Service: Cardiovascular;  Laterality: N/A;  . PANCREATIC STENT PLACEMENT  12/21/2018   Procedure: PANCREATIC STENT PLACEMENT;  Surgeon: Clarene Essex, MD;  [facility];  Service: Endoscopy;;  . PEG PLACEMENT N/A 07/20/2019   Procedure: FEEDING TUBE;  Surgeon: [name], MD;  Location: [place];  Service: General;  Laterality: N/A;  . PERIPHERAL VASCULAR BALLOON ANGIOPLASTY Left 10/19/2017   Procedure: PERIPHERAL VASCULAR BALLOON ANGIOPLASTY;  Surgeon: Waynetta Sandy, MD;  Location: [place] [facility];  Service: Cardiovascular;  Laterality: Left;  Multiple Lower extremity venous sites  . PERIPHERAL VASCULAR INTERVENTION Left 10/18/2017   Procedure: PERIPHERAL VASCULAR INTERVENTION;  Surgeon: Waynetta Sandy, MD;  Location: [place] [facility];  Service: Cardiovascular;  Laterality: Left;  . PORTACATH PLACEMENT Left 02/02/2019   Procedure: INSERTION PORT-A-CATH WITH POSSIBLE ULTRASOUND;  Surgeon: [name], MD;  Location: [place];  Service: General;  Laterality: Left;  . REMOVAL OF STONES  12/21/2018   Procedure: REMOVAL OF STONES;  Surgeon: Clarene Essex, MD;  [facility];  Service: Endoscopy;;  . Joan Mayans  12/21/2018   Procedure: Joan Mayans;  Surgeon: Clarene Essex, MD;  [facility];  Service: Endoscopy;;  . STENT REMOVAL  01/24/2019   Procedure: STENT REMOVAL;  Surgeon: Arta Silence, MD;  [facility];  Service: Endoscopy;;  . WHIPPLE PROCEDURE N/A 07/20/2019   Procedure: WHIPPLE PROCEDURE;  Surgeon: [name], MD;  Location: [place];  Service: General;  Laterality: N/A;    Allergies: Patient has no known allergies.  Medications:  Current Facility-Administered Medications:  .  0.9 %  sodium chloride infusion (Manually program via Guardrails IV Fluids), , Intravenous, Once, Gonfa, Taye T, MD .   0.9 %  sodium chloride infusion, , Intravenous, PRN, Dorie Rank, MD, Last Rate: 5 mL/hr at 08/07/19 2020 .  acetaminophen (TYLENOL) tablet 650 mg, 650 mg, Oral, Q6H PRN **OR** acetaminophen (TYLENOL) suppository 650 mg, 650 mg, Rectal, Q6H PRN, Lenore Cordia, MD .  Chlorhexidine Gluconate Cloth 2 % PADS 6 each, 6 each, Topical, Daily, Lenore Cordia, MD, 6 each at 08/07/19 2142 .  feeding supplement (ENSURE ENLIVE) (ENSURE ENLIVE) liquid 237 mL, 237 mL, Oral, TID BM, Patel, Vishal R, MD .  ferrous sulfate tablet 325 mg, 325 mg, Oral, BID WC, Patel, Vishal R, MD .  insulin aspart (novoLOG) injection 0-9 Units, 0-9 Units, Subcutaneous, Q4H, Lenore Cordia, MD, 2 Units at 08/08/19 0420 .  insulin glargine (LANTUS) injection 8 Units, 8 Units, Subcutaneous, QHS, Lenore Cordia, MD, 8 Units at 08/07/19 2251 .  nicotine (NICODERM CQ - dosed in mg/24 hr) patch 7 mg, 7 mg, Transdermal, Once, Dorie Rank, MD, 7 mg at 08/07/19 1820 .  nicotine (NICODERM CQ - dosed in mg/24 hr) patch 7 mg, 7 mg, Transdermal, Daily, Gonfa, Taye T, MD .  ondansetron (ZOFRAN) tablet 4 mg, 4 mg, Oral, Q6H PRN **OR** ondansetron (ZOFRAN) injection 4 mg, 4 mg, Intravenous, Q6H PRN, Patel, Vishal R, MD .  piperacillin-tazobactam (ZOSYN) IVPB 3.375 g, 3.375 g, Intravenous, Q8H, Patel, Vishal R, MD, Last Rate: 12.5 mL/hr at 08/08/19 1537 .  sodium chloride flush (NS) 0.9 % injection 10-40 mL, 10-40 mL, Intracatheter, PRN, Alphonsa Overall, MD .  sodium chloride  flush (NS) 0.9 % injection 3 mL, 3 mL, Intravenous, Q12H, Zada Finders R, MD, 3 mL at 08/08/19 1107    Family History  Problem Relation Age of Onset  . Diabetes Mother   . Diabetes Father   . CAD Father   . Melanoma Father   . Breast cancer Paternal Aunt 65  . COPD Maternal Aunt   . Melanoma Paternal Uncle   . Stroke Maternal Grandmother   . Tuberculosis Paternal Grandmother   . Cancer Neg Hx     Social History   Socioeconomic History  . Marital status: Married     Spouse name: Not on file  . Number of children: Not on file  . Years of education: Not on file  . Highest education level: Not on file  Occupational History  . Not on file  Social Needs  . Financial resource strain: Not on file  . Food insecurity    Worry: Not on file    Inability: Not on file  . Transportation needs    Medical: Not on file    Non-medical: Not on file  Tobacco Use  . Smoking status: Current Every Day Smoker    Packs/day: 1.00    Types: Cigarettes  . Smokeless tobacco: Never Used  Substance and Sexual Activity  . Alcohol use: No    Frequency: Never  . Drug use: No  . Sexual activity: Not on file  Lifestyle  . Physical activity    Days per week: Not on file    Minutes per session: Not on file  . Stress: Not on file  Relationships  . Social Herbalist on phone: Not on file    Gets together: Not on file    Attends religious service: Not on file    Active member of club or organization: Not on file    Attends meetings of clubs or organizations: Not on file    Relationship status: Not on file  Other Topics Concern  . Not on file  Social History Narrative  . Not on file    Review of Systems: A 12 point ROS discussed and pertinent positives are indicated in the HPI above.  All other systems are negative.  Review of Systems  Vital Signs: BP (!) 102/56 (BP Location: Right Arm)   Pulse 78   Temp 98.2 F (36.8 C) (Oral)   Resp (!) 27   Ht 6\' 4"  (1.93 m)   Wt 92.3 kg   SpO2 97%   BMI 24.77 kg/m   Physical Exam Constitutional:      Appearance: Normal appearance.  HENT:     Mouth/Throat:     Mouth: Mucous membranes are moist.     Pharynx: Oropharynx is clear.  Cardiovascular:     Rate and Rhythm: Normal rate and regular rhythm.     Heart sounds: Normal heart sounds.  Pulmonary:     Effort: Pulmonary effort is normal. No respiratory distress.     Breath sounds: Normal breath sounds.  Abdominal:     General: Abdomen is flat. There  is no distension.     Palpations: Abdomen is soft. There is no mass.  Skin:    General: Skin is warm and dry.  Neurological:     General: No focal deficit present.     Mental Status: He is alert and oriented to person, place, and time.       Imaging: X-ray Abdomen Ap  Result Date: 07/20/2019 CLINICAL DATA:  58 year old  male with NG tube placement. EXAM: ABDOMEN - 1 VIEW COMPARISON:  CT of the abdomen dated 05/11/2019. FINDINGS: An enteric tube is noted extending below the diaphragm. The tube makes a single loop in the proximal stomach with tip located in the left hemiabdomen, likely in the distal stomach. There has been interval removal of the biliary stent. Two drainage tube is noted with tips in the epigastric area. No bowel dilatation. Partially visualized central venous line with tip at the cavoatrial junction. Diffuse vascular and interstitial prominence may represent edema although pneumonia is not excluded. Clinical correlation is recommended. IMPRESSION: Enteric tube with tip likely in the distal stomach. Electronically Signed   By: Anner Crete M.D.   On: 07/20/2019 15:00   Ct Angio Chest Pe W And/or Wo Contrast  Result Date: 08/07/2019 CLINICAL DATA:  Syncopal episode. History of pancreatic cancer status post Whipple procedure 3 weeks ago. EXAM: CT ANGIOGRAPHY CHEST CT ABDOMEN AND PELVIS WITH CONTRAST TECHNIQUE: Multidetector CT imaging of the chest was performed using the standard protocol during bolus administration of intravenous contrast. Multiplanar CT image reconstructions and MIPs were obtained to evaluate the vascular anatomy. Multidetector CT imaging of the abdomen and pelvis was performed using the standard protocol during bolus administration of intravenous contrast. CONTRAST:  169mL OMNIPAQUE IOHEXOL 350 MG/ML SOLN COMPARISON:  CT chest, abdomen, and pelvis dated May 11, 2019. FINDINGS: CTA CHEST FINDINGS Cardiovascular: Suboptimal opacification of the pulmonary arteries.  No central pulmonary embolism. Normal heart size. No pericardial effusion. Three-vessel coronary atherosclerosis. No thoracic aortic aneurysm or dissection. Unchanged left chest wall port catheter with tip at the cavoatrial junction. Mediastinum/Nodes: No enlarged mediastinal, hilar, or axillary lymph nodes. Thyroid gland, trachea, and esophagus demonstrate no significant findings. Lungs/Pleura: Dependent subsegmental atelectasis in both lower lobes. No focal consolidation, pleural effusion, or pneumothorax. No suspicious pulmonary nodule. Musculoskeletal: No chest wall abnormality. No acute or significant osseous findings. Review of the MIP images confirms the above findings. CT ABDOMEN AND PELVIS FINDINGS Hepatobiliary: No focal liver abnormality. Mild periportal edema. Interval cholecystectomy with choledochojejunostomy. 7.8 x 6.1 x 8.8 cm rim enhancing fluid collection inferior to the right liver. Additional 2.8 x 1.7 cm rim enhancing fluid collection with small focus of gas in the right upper quadrant just inferior to the falciform ligament. Pancreas: Interval resection of the pancreatic head and proximal body with pancreaticojejunostomy. Spleen: Normal in size without focal abnormality. Adrenals/Urinary Tract: The adrenal glands are unremarkable. Unchanged 1.9 cm right renal simple cyst. No renal calculi or hydronephrosis. Small foci of dependent air within the bladder likely related to recent instrumentation. No bladder wall thickening. Stomach/Bowel: Prior duodenectomy with gastrojejunostomy. Moderate wall thickening and pericolonic inflammatory changes involving the distal ascending, transverse, and descending colon. Moderate sigmoid diverticulosis. Normal appendix. Jejunostomy feeding tube in place. Vascular/Lymphatic: Aortic atherosclerosis. No enlarged abdominal or pelvic lymph nodes. Reproductive: Prostate is unremarkable. Other: Unchanged small fat containing bilateral inguinal hernias. Trace ascites.  No pneumoperitoneum. Musculoskeletal: Open upper abdominal midline wound. No acute or significant osseous findings. Review of the MIP images confirms the above findings. IMPRESSION: Chest: 1. Suboptimal evaluation of the pulmonary arteries. No central pulmonary embolism. If clinical concern remains high, consider V/Q scan for further evaluation. Abdomen and pelvis: 1. Postsurgical changes related to interval Whipple procedure with two rim enhancing fluid collections in the right upper quadrant measuring up to 7.8 x 6.1 x 8.8 cm, concerning for abscesses. 2. Moderate wall thickening and pericolonic inflammatory changes involving the distal ascending, transverse, and descending colon,  consistent with colitis. 3.  Aortic atherosclerosis (ICD10-I70.0). Electronically Signed   By: Titus Dubin M.D.   On: 08/07/2019 17:50   Ct Abdomen Pelvis W Contrast  Result Date: 08/07/2019 CLINICAL DATA:  Syncopal episode. History of pancreatic cancer status post Whipple procedure 3 weeks ago. EXAM: CT ANGIOGRAPHY CHEST CT ABDOMEN AND PELVIS WITH CONTRAST TECHNIQUE: Multidetector CT imaging of the chest was performed using the standard protocol during bolus administration of intravenous contrast. Multiplanar CT image reconstructions and MIPs were obtained to evaluate the vascular anatomy. Multidetector CT imaging of the abdomen and pelvis was performed using the standard protocol during bolus administration of intravenous contrast. CONTRAST:  133mL OMNIPAQUE IOHEXOL 350 MG/ML SOLN COMPARISON:  CT chest, abdomen, and pelvis dated May 11, 2019. FINDINGS: CTA CHEST FINDINGS Cardiovascular: Suboptimal opacification of the pulmonary arteries. No central pulmonary embolism. Normal heart size. No pericardial effusion. Three-vessel coronary atherosclerosis. No thoracic aortic aneurysm or dissection. Unchanged left chest wall port catheter with tip at the cavoatrial junction. Mediastinum/Nodes: No enlarged mediastinal, hilar, or  axillary lymph nodes. Thyroid gland, trachea, and esophagus demonstrate no significant findings. Lungs/Pleura: Dependent subsegmental atelectasis in both lower lobes. No focal consolidation, pleural effusion, or pneumothorax. No suspicious pulmonary nodule. Musculoskeletal: No chest wall abnormality. No acute or significant osseous findings. Review of the MIP images confirms the above findings. CT ABDOMEN AND PELVIS FINDINGS Hepatobiliary: No focal liver abnormality. Mild periportal edema. Interval cholecystectomy with choledochojejunostomy. 7.8 x 6.1 x 8.8 cm rim enhancing fluid collection inferior to the right liver. Additional 2.8 x 1.7 cm rim enhancing fluid collection with small focus of gas in the right upper quadrant just inferior to the falciform ligament. Pancreas: Interval resection of the pancreatic head and proximal body with pancreaticojejunostomy. Spleen: Normal in size without focal abnormality. Adrenals/Urinary Tract: The adrenal glands are unremarkable. Unchanged 1.9 cm right renal simple cyst. No renal calculi or hydronephrosis. Small foci of dependent air within the bladder likely related to recent instrumentation. No bladder wall thickening. Stomach/Bowel: Prior duodenectomy with gastrojejunostomy. Moderate wall thickening and pericolonic inflammatory changes involving the distal ascending, transverse, and descending colon. Moderate sigmoid diverticulosis. Normal appendix. Jejunostomy feeding tube in place. Vascular/Lymphatic: Aortic atherosclerosis. No enlarged abdominal or pelvic lymph nodes. Reproductive: Prostate is unremarkable. Other: Unchanged small fat containing bilateral inguinal hernias. Trace ascites. No pneumoperitoneum. Musculoskeletal: Open upper abdominal midline wound. No acute or significant osseous findings. Review of the MIP images confirms the above findings. IMPRESSION: Chest: 1. Suboptimal evaluation of the pulmonary arteries. No central pulmonary embolism. If clinical  concern remains high, consider V/Q scan for further evaluation. Abdomen and pelvis: 1. Postsurgical changes related to interval Whipple procedure with two rim enhancing fluid collections in the right upper quadrant measuring up to 7.8 x 6.1 x 8.8 cm, concerning for abscesses. 2. Moderate wall thickening and pericolonic inflammatory changes involving the distal ascending, transverse, and descending colon, consistent with colitis. 3.  Aortic atherosclerosis (ICD10-I70.0). Electronically Signed   By: Titus Dubin M.D.   On: 08/07/2019 17:50   Dg Chest Port 1 View  Result Date: 08/07/2019 CLINICAL DATA:  Syncopal episode. EXAM: PORTABLE CHEST 1 VIEW COMPARISON:  Chest CT 08/07/2019 FINDINGS: The cardiac silhouette, mediastinal and hilar contours are within normal limits and stable. The lungs are clear. No pleural effusion. Left subclavian power port in good position, unchanged. The bony thorax is intact. IMPRESSION: No acute cardiopulmonary findings. Electronically Signed   By: Marijo Sanes M.D.   On: 08/07/2019 17:02   Dg Chest [facility]  1 View  Result Date: 07/24/2019 CLINICAL DATA:  Fever EXAM: PORTABLE CHEST 1 VIEW COMPARISON:  07/22/2019 FINDINGS: The heart size and mediastinal contours are within normal limits. Left chest port catheter. Interval increase in heterogeneous opacity of the left lung base, which is somewhat bandlike and may reflect atelectasis or alternately airspace disease. The visualized skeletal structures are unremarkable. IMPRESSION: Interval increase in heterogeneous opacity of the left lung base, which is somewhat bandlike and may reflect atelectasis or alternately developing airspace disease in the setting of fever. Electronically Signed   By: Eddie Candle M.D.   On: 07/24/2019 09:27   Dg Chest Port 1 View  Result Date: 07/22/2019 CLINICAL DATA:  Respiratory difficulty, fever EXAM: PORTABLE CHEST 1 VIEW COMPARISON:  02/02/2019 FINDINGS: The heart size and mediastinal contours are  within normal limits. Left chest port catheter. Both lungs are clear. The visualized skeletal structures are unremarkable. IMPRESSION: No acute abnormality of the lungs in AP portable projection. Electronically Signed   By: Eddie Candle M.D.   On: 07/22/2019 16:30    Labs:  CBC: Recent Labs    07/25/19 0500 07/27/19 0127 08/07/19 1526 08/08/19 0455 08/08/19 1200  WBC 7.1 7.1 6.7 10.8*  --   HGB 8.4* 8.2* 9.1* 7.0* 7.0*  HCT 25.3* 24.9* 27.9* 22.1* 21.7*  PLT 144* 194 193 139*  --     COAGS: Recent Labs    12/19/18 2153 12/20/18 0626 12/20/18 1935 12/22/18 0159 12/23/18 0014 07/12/19 0959 07/20/19 1610  INR 1.47 1.22  --   --   --  1.0 1.1  APTT  --  33 92* 80* 65*  --   --     BMP: Recent Labs    07/25/19 0500 07/27/19 0127 08/07/19 1526 08/08/19 0455  NA 138 137 131* 135  K 4.0 3.5 3.3* 4.2  CL 104 104 100 106  CO2 22 23 21* 21*  GLUCOSE 220* 156* 175* 163*  BUN 14 13 23* 24*  CALCIUM 8.0* 8.1* 7.9* 7.2*  CREATININE 1.08 1.07 1.24 1.25*  GFRNONAA >60 >60 >60 >60  GFRAA >60 >60 >60 >60    LIVER FUNCTION TESTS: Recent Labs    07/24/19 0516 07/25/19 0500 07/27/19 0127 08/07/19 1526  BILITOT 0.6 0.8 0.7 0.7  AST 15 24 86* 27  ALT 22 24 74* 23  ALKPHOS 109 190* 380* 262*  PROT 5.3* 5.2* 5.3* 6.3*  ALBUMIN 2.6* 2.4* 2.2* 2.6*    TUMOR MARKERS: No results for input(s): AFPTM, CEA, CA199, CHROMGRNA in the last 8760 hours.  Assessment and Plan: S/p Whipple procedure for pancreatic cancer. Post op intra-abdominal abscess For IR perc drainage tomorrow. NPO p MN Cont to hold Eliquis. Risks and benefits discussed with the patient including bleeding, infection, damage to adjacent structures, bowel perforation/fistula connection, and sepsis.  All of the patient's questions were answered, patient is agreeable to proceed. Consent signed and in chart.    Thank you for this interesting consult.  I greatly enjoyed meeting Brian Torres and look forward  to participating in their care.  A copy of this report was sent to the requesting provider on this date.  Electronically Signed: Ascencion Dike, PA-C 08/08/2019, 4:11 PM   I spent a total of 20 minutes in face to face in clinical consultation, greater than 50% of which was counseling/coordinating care for perc abscess drain

## 2019-08-08 NOTE — Progress Notes (Signed)
PROGRESS NOTE  Brian Torres [id number] DOB: 19-May-1961   PCP: Cari Caraway, MD  Patient is from: Home  DOA: 08/07/2019 LOS: 1  Brief Narrative / Interim history: 58 y.o. male with medical history significant for pancreatic cancer s/p Whipple procedure 07/20/2019, IDT2DM, HTN, HLD, Anemia, Hx of LLE DVT on Eliquis who presents to the ED for evaluation of nausea, vomiting, and near syncopal episode, and admitted for sepsis due to postsurgical intra-abdominal fluid collection concerning for abscesses, and colitis.  Started around vancomycin, cefepime and Flagyl in ED.  Transitioned to Zosyn on admission.  C. difficile negative.  General surgery consulted.  Subjective: Was hypotensive to 77/47 about 21 hours last night.  Vital signs within normal range this morning.  Reports feeling better this morning.  No complaints except difficulty sleeping.  Denies chest pain, dyspnea, cough, nausea, vomiting, abdominal pain or dysuria.  Objective: Vitals:   08/08/19 0400 08/08/19 0500 08/08/19 0800 08/08/19 0819  BP: 99/60   (!) 102/56  Pulse: 76  79 78  Resp: 15  (!) 24 (!) 27  Temp: 98.3 F (36.8 C)  97.7 F (36.5 C)   TempSrc: Oral  Oral   SpO2: 98%  98% 97%  Weight:  92.3 kg    Height:        Intake/Output Summary (Last 24 hours) at 08/08/2019 0952 Last data filed at 08/08/2019 0852 Gross per 24 hour  Intake 3294.81 ml  Output 300 ml  Net 2994.81 ml   Filed Weights   08/07/19 1456 08/07/19 2135 08/08/19 0500  Weight: 88 kg 92.3 kg 92.3 kg    Examination:  GENERAL: No acute distress.  Appears well.  HEENT: MMM.  Vision and hearing grossly intact.  NECK: Supple.  No apparent JVD.  RESP:  No IWOB. Good air movement bilaterally. CVS:  RRR. Heart sounds normal.  ABD/GI/GU: Bowel sounds present. Soft.  Mild tenderness.  Dressing over laparotomy wound dry and clean.  Feeding tube over LUQ. MSK/EXT:  Moves extremities. No apparent deformity or edema.  SKIN: no apparent skin  lesion or wound NEURO: Awake, alert and oriented appropriately.  No gross deficit.  PSYCH: Calm. Normal affect.   Assessment & Plan: Sepsis due to suspected intra-abdominal abscess and colitis.  Patient was febrile, tachycardic with low blood pressures on admission. -CT abdomen and pelvis concerning for RUQ fluid collection/abscess. -C. difficile negative. -Continue IV Zosyn.  Follow blood cultures. -General surgery following.  May have to involve IR as well. -Eliquis on hold.  Colitis: Noted on CT.  C. difficile negative.  Does not have frequent bowel movements.  Ischemic in the setting of sepsis/hypotension? -May consider GI panel and a GI consult  Pancreatic cancer status post Whipple procedure on 07/20/2019: Still with open laparotomy wound.  Follows with oncology, Dr. Learta Codding.  Completed neoadjuvant FOLFIRINOX therapy.  Laparotomy wound appears clean -General surgery following.  IDDM-2 with hyperglycemia.  CBG improved this morning. -Continue current regimen  History of LLE DVT -Eliquis on hold for potential procedure.  Anemia of chronic disease: H&H dropped from 9.1-7.0 likely dilutional.  Reportedly no hematochezia or melena. -Monitor H&H. -We will check FOBT  Essential hypertension: Hypotensive overnight.  Soft blood pressure this morning. -Continue holding home lisinopril -Continue IV fluid.  Hypokalemia/hyponatremia/hypomagnesemia: Resolved.  Non-anion gap metabolic acidosis: Stable.  Thrombocytopenia: Likely dilutional. -Continue monitoring  Tobacco use disorder: -Encourage cessation -Nicotine patch.  DVT prophylaxis: SCD Code Status: Full code Family Communication: Patient and/or RN. Available if any question.  Disposition Plan:  Remains inpatient. Consultants: GI  Procedures:  None  Microbiology summarized: SARS-CoV-2 screen negative. C. difficile PCR negative. Urine culture pending.  Antimicrobials: Anti-infectives (From admission, onward)    Start     Dose/Rate Route Frequency Ordered Stop   08/07/19 2200  piperacillin-tazobactam (ZOSYN) IVPB 3.375 g     3.375 g 12.5 mL/hr over 240 Minutes Intravenous Every 8 hours 08/07/19 2127     08/07/19 1600  vancomycin (VANCOCIN) 2,000 mg in sodium chloride 0.9 % 500 mL IVPB     2,000 mg 250 mL/hr over 120 Minutes Intravenous  Once 08/07/19 1539 08/07/19 2020   08/07/19 1545  ceFEPIme (MAXIPIME) 2 g in sodium chloride 0.9 % 100 mL IVPB     2 g 200 mL/hr over 30 Minutes Intravenous  Once 08/07/19 1539 08/07/19 1956   08/07/19 1530  metroNIDAZOLE (FLAGYL) IVPB 500 mg     500 mg 100 mL/hr over 60 Minutes Intravenous  Once 08/07/19 1527 08/07/19 1956   08/07/19 1530  vancomycin (VANCOCIN) IVPB 1000 mg/200 mL premix  Status:  Discontinued     1,000 mg 200 mL/hr over 60 Minutes Intravenous  Once 08/07/19 1527 08/07/19 1539      Sch Meds:  Scheduled Meds:  Chlorhexidine Gluconate Cloth  6 each Topical Daily   feeding supplement (ENSURE ENLIVE)  237 mL Oral TID BM   ferrous sulfate  325 mg Oral BID WC   insulin aspart  0-9 Units Subcutaneous Q4H   insulin glargine  8 Units Subcutaneous QHS   nicotine  7 mg Transdermal Once   sodium chloride flush  3 mL Intravenous Q12H   Continuous Infusions:  sodium chloride 5 mL/hr at 08/07/19 2020   piperacillin-tazobactam (ZOSYN)  IV 12.5 mL/hr at 08/08/19 0852   PRN Meds:.sodium chloride, acetaminophen **OR** acetaminophen, ondansetron **OR** ondansetron (ZOFRAN) IV, sodium chloride flush   I have personally reviewed the following labs and images: CBC: Recent Labs  Lab 08/07/19 1526 08/08/19 0455  WBC 6.7 10.8*  NEUTROABS 6.5  --   HGB 9.1* 7.0*  HCT 27.9* 22.1*  MCV 92.4 94.0  PLT 193 139*   BMP &GFR Recent Labs  Lab 08/07/19 1526 08/07/19 1607 08/08/19 0455  NA 131*  --  135  K 3.3*  --  4.2  CL 100  --  106  CO2 21*  --  21*  GLUCOSE 175*  --  163*  BUN 23*  --  24*  CREATININE 1.24  --  1.25*  CALCIUM 7.9*  --   7.2*  MG  --  1.2* 1.9   Estimated Creatinine Clearance: 79.1 mL/min (A) (by C-G formula based on SCr of 1.25 mg/dL (H)). Liver & Pancreas: Recent Labs  Lab 08/07/19 1526  AST 27  ALT 23  ALKPHOS 262*  BILITOT 0.7  PROT 6.3*  ALBUMIN 2.6*   Recent Labs  Lab 08/07/19 1526  LIPASE 15   No results for input(s): AMMONIA in the last 168 hours. Diabetic: No results for input(s): HGBA1C in the last 72 hours. Recent Labs  Lab 08/07/19 1505 08/07/19 2207 08/08/19 0057 08/08/19 0339 08/08/19 0817  GLUCAP 177* 185* 211* 180* 104*   Cardiac Enzymes: No results for input(s): CKTOTAL, CKMB, CKMBINDEX, TROPONINI in the last 168 hours. No results for input(s): PROBNP in the last 8760 hours. Coagulation Profile: No results for input(s): INR, PROTIME in the last 168 hours. Thyroid Function Tests: No results for input(s): TSH, T4TOTAL, FREET4, T3FREE, THYROIDAB in the last 72 hours. Lipid Profile:  No results for input(s): CHOL, HDL, LDLCALC, TRIG, CHOLHDL, LDLDIRECT in the last 72 hours. Anemia Panel: No results for input(s): VITAMINB12, FOLATE, FERRITIN, TIBC, IRON, RETICCTPCT in the last 72 hours. Urine analysis:    Component Value Date/Time   COLORURINE AMBER (A) 08/07/2019 1526   APPEARANCEUR HAZY (A) 08/07/2019 1526   LABSPEC >1.046 (H) 08/07/2019 1526   PHURINE 5.0 08/07/2019 1526   GLUCOSEU NEGATIVE 08/07/2019 1526   HGBUR NEGATIVE 08/07/2019 [place] 08/07/2019 1526   KETONESUR 5 (A) 08/07/2019 1526   PROTEINUR 30 (A) 08/07/2019 1526   NITRITE NEGATIVE 08/07/2019 [place] 08/07/2019 1526   Sepsis Labs: Invalid input(s): PROCALCITONIN, [place]  Microbiology: Recent Results (from the past 240 hour(s))  SARS Coronavirus 2 [facility] order, Performed in [facility] hospital lab) Nasopharyngeal Nasopharyngeal Swab     Status: None   Collection Time: 08/07/19  3:28 PM   Specimen: Nasopharyngeal Swab  Result Value Ref Range  Status   SARS Coronavirus 2 NEGATIVE NEGATIVE Final    Comment: (NOTE) If result is NEGATIVE SARS-CoV-2 target nucleic acids are NOT DETECTED. The SARS-CoV-2 RNA is generally detectable in upper and lower  respiratory specimens during the acute phase of infection. The lowest  concentration of SARS-CoV-2 viral copies this assay can detect is 250  copies / mL. A negative result does not preclude SARS-CoV-2 infection  and should not be used as the sole basis for treatment or other  patient management decisions.  A negative result may occur with  improper specimen collection / handling, submission of specimen other  than nasopharyngeal swab, presence of viral mutation(s) within the  areas targeted by this assay, and inadequate number of viral copies  (<250 copies / mL). A negative result must be combined with clinical  observations, patient history, and epidemiological information. If result is POSITIVE SARS-CoV-2 target nucleic acids are DETECTED. The SARS-CoV-2 RNA is generally detectable in upper and lower  respiratory specimens dur ing the acute phase of infection.  Positive  results are indicative of active infection with SARS-CoV-2.  Clinical  correlation with patient history and other diagnostic information is  necessary to determine patient infection status.  Positive results do  not rule out bacterial infection or co-infection with other viruses. If result is PRESUMPTIVE POSTIVE SARS-CoV-2 nucleic acids MAY BE PRESENT.   A presumptive positive result was obtained on the submitted specimen  and confirmed on repeat testing.  While 2019 novel coronavirus  (SARS-CoV-2) nucleic acids may be present in the submitted sample  additional confirmatory testing may be necessary for epidemiological  and / or clinical management purposes  to differentiate between  SARS-CoV-2 and other Sarbecovirus currently known to infect humans.  If clinically indicated additional testing with an alternate  test  methodology [phone]) is advised. The SARS-CoV-2 RNA is generally  detectable in upper and lower respiratory sp ecimens during the acute  phase of infection. The expected result is Negative. Fact Sheet for Patients:  StrictlyIdeas.no Fact Sheet for Healthcare Providers: BankingDealers.co.za This test is not yet approved or cleared by the [place] FDA and has been authorized for detection and/or diagnosis of SARS-CoV-2 by FDA under an Emergency Use Authorization (EUA).  This EUA will remain in effect (meaning this test can be used) for the duration of the COVID-19 declaration under Section 564(b)(1) of the Act, 21 U.S.C. section 360bbb-3(b)(1), unless the authorization is terminated or revoked sooner. Performed at [facility], [address] [place], [place]  [address]   C difficile quick scan w PCR reflex     Status: None   Collection Time: 08/07/19  6:54 PM   Specimen: STOOL  Result Value Ref Range Status   C Diff antigen NEGATIVE NEGATIVE Final   C Diff toxin NEGATIVE NEGATIVE Final   C Diff interpretation No C. difficile detected.  Final    Comment: Performed at [facility], [place] [address], [place], [place] [address]    Radiology Studies: Ct Angio Chest Pe W And/or Wo Contrast  Result Date: 08/07/2019 CLINICAL DATA:  Syncopal episode. History of pancreatic cancer status post Whipple procedure 3 weeks ago. EXAM: CT ANGIOGRAPHY CHEST CT ABDOMEN AND PELVIS WITH CONTRAST TECHNIQUE: Multidetector CT imaging of the chest was performed using the standard protocol during bolus administration of intravenous contrast. Multiplanar CT image reconstructions and MIPs were obtained to evaluate the vascular anatomy. Multidetector CT imaging of the abdomen and pelvis was performed using the standard protocol during bolus administration of intravenous contrast. CONTRAST:  138mL OMNIPAQUE IOHEXOL 350  MG/ML SOLN COMPARISON:  CT chest, abdomen, and pelvis dated May 11, 2019. FINDINGS: CTA CHEST FINDINGS Cardiovascular: Suboptimal opacification of the pulmonary arteries. No central pulmonary embolism. Normal heart size. No pericardial effusion. Three-vessel coronary atherosclerosis. No thoracic aortic aneurysm or dissection. Unchanged left chest wall port catheter with tip at the cavoatrial junction. Mediastinum/Nodes: No enlarged mediastinal, hilar, or axillary lymph nodes. Thyroid gland, trachea, and esophagus demonstrate no significant findings. Lungs/Pleura: Dependent subsegmental atelectasis in both lower lobes. No focal consolidation, pleural effusion, or pneumothorax. No suspicious pulmonary nodule. Musculoskeletal: No chest wall abnormality. No acute or significant osseous findings. Review of the MIP images confirms the above findings. CT ABDOMEN AND PELVIS FINDINGS Hepatobiliary: No focal liver abnormality. Mild periportal edema. Interval cholecystectomy with choledochojejunostomy. 7.8 x 6.1 x 8.8 cm rim enhancing fluid collection inferior to the right liver. Additional 2.8 x 1.7 cm rim enhancing fluid collection with small focus of gas in the right upper quadrant just inferior to the falciform ligament. Pancreas: Interval resection of the pancreatic head and proximal body with pancreaticojejunostomy. Spleen: Normal in size without focal abnormality. Adrenals/Urinary Tract: The adrenal glands are unremarkable. Unchanged 1.9 cm right renal simple cyst. No renal calculi or hydronephrosis. Small foci of dependent air within the bladder likely related to recent instrumentation. No bladder wall thickening. Stomach/Bowel: Prior duodenectomy with gastrojejunostomy. Moderate wall thickening and pericolonic inflammatory changes involving the distal ascending, transverse, and descending colon. Moderate sigmoid diverticulosis. Normal appendix. Jejunostomy feeding tube in place. Vascular/Lymphatic: Aortic  atherosclerosis. No enlarged abdominal or pelvic lymph nodes. Reproductive: Prostate is unremarkable. Other: Unchanged small fat containing bilateral inguinal hernias. Trace ascites. No pneumoperitoneum. Musculoskeletal: Open upper abdominal midline wound. No acute or significant osseous findings. Review of the MIP images confirms the above findings. IMPRESSION: Chest: 1. Suboptimal evaluation of the pulmonary arteries. No central pulmonary embolism. If clinical concern remains high, consider V/Q scan for further evaluation. Abdomen and pelvis: 1. Postsurgical changes related to interval Whipple procedure with two rim enhancing fluid collections in the right upper quadrant measuring up to 7.8 x 6.1 x 8.8 cm, concerning for abscesses. 2. Moderate wall thickening and pericolonic inflammatory changes involving the distal ascending, transverse, and descending colon, consistent with colitis. 3.  Aortic atherosclerosis (ICD10-I70.0). Electronically Signed   By: Titus Dubin M.D.   On: 08/07/2019 17:50   Ct Abdomen Pelvis W Contrast  Result Date: 08/07/2019 CLINICAL DATA:  Syncopal episode. History of pancreatic cancer status post  Whipple procedure 3 weeks ago. EXAM: CT ANGIOGRAPHY CHEST CT ABDOMEN AND PELVIS WITH CONTRAST TECHNIQUE: Multidetector CT imaging of the chest was performed using the standard protocol during bolus administration of intravenous contrast. Multiplanar CT image reconstructions and MIPs were obtained to evaluate the vascular anatomy. Multidetector CT imaging of the abdomen and pelvis was performed using the standard protocol during bolus administration of intravenous contrast. CONTRAST:  159mL OMNIPAQUE IOHEXOL 350 MG/ML SOLN COMPARISON:  CT chest, abdomen, and pelvis dated May 11, 2019. FINDINGS: CTA CHEST FINDINGS Cardiovascular: Suboptimal opacification of the pulmonary arteries. No central pulmonary embolism. Normal heart size. No pericardial effusion. Three-vessel coronary  atherosclerosis. No thoracic aortic aneurysm or dissection. Unchanged left chest wall port catheter with tip at the cavoatrial junction. Mediastinum/Nodes: No enlarged mediastinal, hilar, or axillary lymph nodes. Thyroid gland, trachea, and esophagus demonstrate no significant findings. Lungs/Pleura: Dependent subsegmental atelectasis in both lower lobes. No focal consolidation, pleural effusion, or pneumothorax. No suspicious pulmonary nodule. Musculoskeletal: No chest wall abnormality. No acute or significant osseous findings. Review of the MIP images confirms the above findings. CT ABDOMEN AND PELVIS FINDINGS Hepatobiliary: No focal liver abnormality. Mild periportal edema. Interval cholecystectomy with choledochojejunostomy. 7.8 x 6.1 x 8.8 cm rim enhancing fluid collection inferior to the right liver. Additional 2.8 x 1.7 cm rim enhancing fluid collection with small focus of gas in the right upper quadrant just inferior to the falciform ligament. Pancreas: Interval resection of the pancreatic head and proximal body with pancreaticojejunostomy. Spleen: Normal in size without focal abnormality. Adrenals/Urinary Tract: The adrenal glands are unremarkable. Unchanged 1.9 cm right renal simple cyst. No renal calculi or hydronephrosis. Small foci of dependent air within the bladder likely related to recent instrumentation. No bladder wall thickening. Stomach/Bowel: Prior duodenectomy with gastrojejunostomy. Moderate wall thickening and pericolonic inflammatory changes involving the distal ascending, transverse, and descending colon. Moderate sigmoid diverticulosis. Normal appendix. Jejunostomy feeding tube in place. Vascular/Lymphatic: Aortic atherosclerosis. No enlarged abdominal or pelvic lymph nodes. Reproductive: Prostate is unremarkable. Other: Unchanged small fat containing bilateral inguinal hernias. Trace ascites. No pneumoperitoneum. Musculoskeletal: Open upper abdominal midline wound. No acute or significant  osseous findings. Review of the MIP images confirms the above findings. IMPRESSION: Chest: 1. Suboptimal evaluation of the pulmonary arteries. No central pulmonary embolism. If clinical concern remains high, consider V/Q scan for further evaluation. Abdomen and pelvis: 1. Postsurgical changes related to interval Whipple procedure with two rim enhancing fluid collections in the right upper quadrant measuring up to 7.8 x 6.1 x 8.8 cm, concerning for abscesses. 2. Moderate wall thickening and pericolonic inflammatory changes involving the distal ascending, transverse, and descending colon, consistent with colitis. 3.  Aortic atherosclerosis (ICD10-I70.0). Electronically Signed   By: Titus Dubin M.D.   On: 08/07/2019 17:50   Dg Chest Port 1 View  Result Date: 08/07/2019 CLINICAL DATA:  Syncopal episode. EXAM: PORTABLE CHEST 1 VIEW COMPARISON:  Chest CT 08/07/2019 FINDINGS: The cardiac silhouette, mediastinal and hilar contours are within normal limits and stable. The lungs are clear. No pleural effusion. Left subclavian power port in good position, unchanged. The bony thorax is intact. IMPRESSION: No acute cardiopulmonary findings. Electronically Signed   By: Marijo Sanes M.D.   On: 08/07/2019 17:02    Reis [address] T. [place]  If 7PM-7AM, please contact night-coverage [email or web address] Password [email or web address] 08/08/2019, 9:52 AM

## 2019-08-09 ENCOUNTER — Encounter (HOSPITAL_COMMUNITY): Payer: Self-pay | Admitting: Radiology

## 2019-08-09 ENCOUNTER — Inpatient Hospital Stay (HOSPITAL_COMMUNITY): Payer: Managed Care, Other (non HMO)

## 2019-08-09 DIAGNOSIS — B3749 Other urogenital candidiasis: Secondary | ICD-10-CM

## 2019-08-09 DIAGNOSIS — E872 Acidosis: Secondary | ICD-10-CM

## 2019-08-09 LAB — COMPREHENSIVE METABOLIC PANEL
ALT: 19 U/L (ref 0–44)
AST: 18 U/L (ref 15–41)
Albumin: 2.2 g/dL — ABNORMAL LOW (ref 3.5–5.0)
Alkaline Phosphatase: 210 U/L — ABNORMAL HIGH (ref 38–126)
Anion gap: 16 — ABNORMAL HIGH (ref 5–15)
BUN: 10 mg/dL (ref 6–20)
CO2: 16 mmol/L — ABNORMAL LOW (ref 22–32)
Calcium: 7.9 mg/dL — ABNORMAL LOW (ref 8.9–10.3)
Chloride: 101 mmol/L (ref 98–111)
Creatinine, Ser: 0.88 mg/dL (ref 0.61–1.24)
GFR calc Af Amer: >60 mL/min (ref >60)
GFR calc non Af Amer: >60 mL/min (ref >60)
Glucose, Bld: 119 mg/dL — ABNORMAL HIGH (ref 70–99)
Potassium: 3.6 mmol/L (ref 3.5–5.1)
Sodium: 133 mmol/L — ABNORMAL LOW (ref 135–145)
Total Bilirubin: 0.3 mg/dL (ref 0.3–1.2)
Total Protein: 6 g/dL — ABNORMAL LOW (ref 6.5–8.1)

## 2019-08-09 LAB — CBC
HCT: 24.3 % — ABNORMAL LOW (ref 39.0–52.0)
Hemoglobin: 7.9 g/dL — ABNORMAL LOW (ref 13.0–17.0)
MCH: 29.6 pg (ref 26.0–34.0)
MCHC: 32.5 g/dL (ref 30.0–36.0)
MCV: 91 fL (ref 80.0–100.0)
Platelets: 161 10*3/uL (ref 150–400)
RBC: 2.67 MIL/uL — ABNORMAL LOW (ref 4.22–5.81)
RDW: 15 % (ref 11.5–15.5)
WBC: 8.6 10*3/uL (ref 4.0–10.5)
nRBC: 0 % (ref 0.0–0.2)

## 2019-08-09 LAB — TYPE AND SCREEN
ABO/RH(D): O POS
Antibody Screen: NEGATIVE
Unit division: 0

## 2019-08-09 LAB — GLUCOSE, CAPILLARY
Glucose-Capillary: 132 mg/dL — ABNORMAL HIGH (ref 70–99)
Glucose-Capillary: 131 mg/dL — ABNORMAL HIGH (ref 70–99)
Glucose-Capillary: 114 mg/dL — ABNORMAL HIGH (ref 70–99)
Glucose-Capillary: 110 mg/dL — ABNORMAL HIGH (ref 70–99)
Glucose-Capillary: 185 mg/dL — ABNORMAL HIGH (ref 70–99)
Glucose-Capillary: 147 mg/dL — ABNORMAL HIGH (ref 70–99)

## 2019-08-09 LAB — PHOSPHORUS: Phosphorus: 2.6 mg/dL (ref 2.5–4.6)

## 2019-08-09 LAB — BPAM RBC
Blood Product Expiration Date: 202011072359
ISSUE DATE / TIME: 202010061707
Unit Type and Rh: 5100

## 2019-08-09 LAB — MAGNESIUM: Magnesium: 1.6 mg/dL — ABNORMAL LOW (ref 1.7–2.4)

## 2019-08-09 MED ORDER — NALOXONE HCL 0.4 MG/ML IJ SOLN
INTRAMUSCULAR | Status: AC
  Filled 2019-08-09: qty 1

## 2019-08-09 MED ORDER — FLUMAZENIL 0.5 MG/5ML IV SOLN
INTRAVENOUS | Status: AC
  Filled 2019-08-09: qty 5

## 2019-08-09 MED ORDER — DIPHENHYDRAMINE HCL 25 MG PO CAPS
25.0000 mg | ORAL_CAPSULE | Freq: Once | ORAL | Status: AC
  Administered 2019-08-09: 25 mg via ORAL
  Filled 2019-08-09: qty 1

## 2019-08-09 MED ORDER — MIDAZOLAM HCL 2 MG/2ML IJ SOLN
INTRAMUSCULAR | Status: AC | PRN
  Administered 2019-08-09 (×4): 1 mg via INTRAVENOUS

## 2019-08-09 MED ORDER — FENTANYL CITRATE (PF) 100 MCG/2ML IJ SOLN
INTRAMUSCULAR | Status: AC | PRN
  Administered 2019-08-09 (×2): 50 ug via INTRAVENOUS

## 2019-08-09 MED ORDER — MIDAZOLAM HCL 2 MG/2ML IJ SOLN
INTRAMUSCULAR | Status: AC
  Filled 2019-08-09: qty 2

## 2019-08-09 MED ORDER — SODIUM CHLORIDE 0.9% FLUSH
5.0000 mL | Freq: Three times a day (TID) | INTRAVENOUS | Status: DC
  Administered 2019-08-09 – 2019-08-11 (×6): 5 mL

## 2019-08-09 MED ORDER — LIDOCAINE HCL (PF) 1 % IJ SOLN
INTRAMUSCULAR | Status: AC | PRN
  Administered 2019-08-09: 10 mL

## 2019-08-09 MED ORDER — MAGNESIUM SULFATE 2 GM/50ML IV SOLN
2.0000 g | Freq: Once | INTRAVENOUS | Status: AC
  Administered 2019-08-09: 11:00:00 2 g via INTRAVENOUS
  Filled 2019-08-09: qty 50

## 2019-08-09 MED ORDER — FENTANYL CITRATE (PF) 100 MCG/2ML IJ SOLN
INTRAMUSCULAR | Status: AC
  Filled 2019-08-09: qty 4

## 2019-08-09 MED ORDER — SODIUM CHLORIDE 0.9 % IV SOLN
INTRAVENOUS | Status: AC
  Filled 2019-08-09: qty 250

## 2019-08-09 NOTE — Progress Notes (Signed)
PROGRESS NOTE  Brian Torres [id number] DOB: 1961/10/29   PCP: Cari Caraway, MD  Patient is from: Home  DOA: 08/07/2019 LOS: 2  Brief Narrative / Interim history: 58 y.o. male with medical history significant for pancreatic cancer s/p Whipple procedure 07/20/2019, IDT2DM, HTN, HLD, Anemia, Hx of LLE DVT on Eliquis who presents to the ED for evaluation of nausea, vomiting, and near syncopal episode, and admitted for sepsis due to postsurgical intra-abdominal fluid collection concerning for abscesses, and colitis.  Started around vancomycin, cefepime and Flagyl in ED.  Transitioned to Zosyn on admission.  C. difficile negative.  General surgery consulted.  Subjective: No major events overnight of this morning.  No complaint this morning.  Reports having a better night last night.  Denies chest pain, dyspnea, abdominal pain, diarrhea, constipation or UTI symptoms.  Denies melena or hematochezia.  Objective: Vitals:   08/09/19 0400 08/09/19 0500 08/09/19 0700 08/09/19 0800  BP: 132/69  (!) 146/72 (!) 141/78  Pulse: 75  82 78  Resp: 18  13 (!) 23  Temp:    98.7 F (37.1 C)  TempSrc:    Oral  SpO2: 95%  95% 97%  Weight:  92.3 kg    Height:        Intake/Output Summary (Last 24 hours) at 08/09/2019 1001 Last data filed at 08/09/2019 0800 Gross per 24 hour  Intake 542.84 ml  Output 1600 ml  Net -1057.16 ml   Filed Weights   08/07/19 2135 08/08/19 0500 08/09/19 0500  Weight: 92.3 kg 92.3 kg 92.3 kg    Examination:  GENERAL: No acute distress.  Resting comfortably. HEENT: MMM.  Vision and hearing grossly intact.  NECK: Supple.  No apparent JVD.  RESP:  No IWOB. Good air movement bilaterally. CVS:  RRR. Heart sounds normal.  ABD/GI/GU: BS present. Soft. Non tender.  Dressing over laparotomy wound dry and clean.  J-tube. MSK/EXT:  Moves extremities. No apparent deformity or edema.  SKIN: no apparent skin lesion or wound NEURO: Awake, alert and oriented appropriately.  No  gross deficit.  PSYCH: Calm. Normal affect.   Assessment & Plan: Sepsis due to suspected intra-abdominal abscess and colitis.  Patient was febrile, tachycardic with low blood pressures on admission.  Sepsis physiology resolved.  Blood cultures negative. -CT abdomen and pelvis concerning for RUQ fluid collection/abscess. -C. difficile negative. -Continue IV Zosyn.  Follow blood cultures. -General surgery and IR involved.  Plan for intra-abdominal fluid drain today. -Eliquis on hold.  Colitis: Noted on CT.  C. difficile negative.  No diarrhea.  Abdominal pain resolved.  Could be infectious or ischemic. -Continue Zosyn as above.  Pancreatic cancer status post Whipple procedure on 07/20/2019: Still with open laparotomy wound.  Follows with oncology, Dr. Learta Codding.  Completed neoadjuvant FOLFIRINOX therapy.  Laparotomy wound appears clean -General surgery following.  IDDM-2 with hyperglycemia.  CBG improved this morning. -Continue current regimen  History of LLE DVT -Eliquis on hold for potential procedure.  Anemia of chronic disease: H&H dropped from 9.1-7.0 partly dilutional.  Denies hematochezia or melena.  Transfused 1 unit with appropriate response.  H&H stable now. -Monitor H&H.  Anion gap metabolic acidosis: Due to malnutrition? -Optimize nutrition.  Appreciate dietitian input. -Recheck in the morning.  Essential hypertension: Normotensive. -Continue holding home lisinopril  Hypokalemia/hyponatremia/hypomagnesemia:  -Replenish and recheck.  Thrombocytopenia: Likely dilutional. -Continue monitoring  Tobacco use disorder: -Encourage cessation -Nicotine patch.  Funguria: Urine culture grew 50,000 colonies of yeast.  Differential includes contamination, colonization or possible infection but  patient has no UTI symptoms.  Blood culture negative -We will recheck urine culture.  DVT prophylaxis: SCD Code Status: Full code Family Communication: Updated patient's wife over the  phone 10/6. Disposition Plan: Remains inpatient. Consultants: GI, IR  Procedures:  None  Microbiology summarized: SARS-CoV-2 screen negative. C. difficile PCR negative. Blood culture negative. Urine culture 50,000 colonies of yeast.  Antimicrobials: Anti-infectives (From admission, onward)   Start     Dose/Rate Route Frequency Ordered Stop   08/07/19 2200  piperacillin-tazobactam (ZOSYN) IVPB 3.375 g     3.375 g 12.5 mL/hr over 240 Minutes Intravenous Every 8 hours 08/07/19 2127     08/07/19 1600  vancomycin (VANCOCIN) 2,000 mg in sodium chloride 0.9 % 500 mL IVPB     2,000 mg 250 mL/hr over 120 Minutes Intravenous  Once 08/07/19 1539 08/07/19 2020   08/07/19 1545  ceFEPIme (MAXIPIME) 2 g in sodium chloride 0.9 % 100 mL IVPB     2 g 200 mL/hr over 30 Minutes Intravenous  Once 08/07/19 1539 08/07/19 1956   08/07/19 1530  metroNIDAZOLE (FLAGYL) IVPB 500 mg     500 mg 100 mL/hr over 60 Minutes Intravenous  Once 08/07/19 1527 08/07/19 1956   08/07/19 1530  vancomycin (VANCOCIN) IVPB 1000 mg/200 mL premix  Status:  Discontinued     1,000 mg 200 mL/hr over 60 Minutes Intravenous  Once 08/07/19 1527 08/07/19 1539      Sch Meds:  Scheduled Meds: . Chlorhexidine Gluconate Cloth  6 each Topical Daily  . feeding supplement (ENSURE ENLIVE)  237 mL Oral TID BM  . ferrous sulfate  325 mg Oral BID WC  . insulin aspart  0-9 Units Subcutaneous Q4H  . insulin glargine  8 Units Subcutaneous QHS  . nicotine  7 mg Transdermal Daily  . sodium chloride flush  3 mL Intravenous Q12H   Continuous Infusions: . sodium chloride 5 mL/hr at 08/07/19 2020  . piperacillin-tazobactam (ZOSYN)  IV Stopped (08/09/19 0940)   PRN Meds:.sodium chloride, acetaminophen **OR** acetaminophen, ondansetron **OR** ondansetron (ZOFRAN) IV, sodium chloride flush   I have personally reviewed the following labs and images: CBC: Recent Labs  Lab 08/07/19 1526 08/08/19 0455 08/08/19 1200 08/08/19 2045 08/09/19  0923  WBC 6.7 10.8*  --   --  8.6  NEUTROABS 6.5  --   --   --   --   HGB 9.1* 7.0* 7.0* 7.7* 7.9*  HCT 27.9* 22.1* 21.7* 24.1* 24.3*  MCV 92.4 94.0  --   --  91.0  PLT 193 139*  --   --  161   BMP &GFR Recent Labs  Lab 08/07/19 1526 08/07/19 1607 08/08/19 0455 08/09/19 0923  NA 131*  --  135 133*  K 3.3*  --  4.2 3.6  CL 100  --  106 101  CO2 21*  --  21* 16*  GLUCOSE 175*  --  163* 119*  BUN 23*  --  24* 10  CREATININE 1.24  --  1.25* 0.88  CALCIUM 7.9*  --  7.2* 7.9*  MG  --  1.2* 1.9 1.6*   Estimated Creatinine Clearance: 112.3 mL/min (by C-G formula based on SCr of 0.88 mg/dL). Liver & Pancreas: Recent Labs  Lab 08/07/19 1526 08/09/19 0923  AST 27 18  ALT 23 19  ALKPHOS 262* 210*  BILITOT 0.7 0.3  PROT 6.3* 6.0*  ALBUMIN 2.6* 2.2*   Recent Labs  Lab 08/07/19 1526  LIPASE 15   No results for input(s):  AMMONIA in the last 168 hours. Diabetic: No results for input(s): HGBA1C in the last 72 hours. Recent Labs  Lab 08/08/19 1206 08/08/19 1521 08/08/19 1922 08/08/19 2310 08/09/19 0344  GLUCAP 96 92 192* 174* 132*   Cardiac Enzymes: No results for input(s): CKTOTAL, CKMB, CKMBINDEX, TROPONINI in the last 168 hours. No results for input(s): PROBNP in the last 8760 hours. Coagulation Profile: No results for input(s): INR, PROTIME in the last 168 hours. Thyroid Function Tests: No results for input(s): TSH, T4TOTAL, FREET4, T3FREE, THYROIDAB in the last 72 hours. Lipid Profile: No results for input(s): CHOL, HDL, LDLCALC, TRIG, CHOLHDL, LDLDIRECT in the last 72 hours. Anemia Panel: No results for input(s): VITAMINB12, FOLATE, FERRITIN, TIBC, IRON, RETICCTPCT in the last 72 hours. Urine analysis:    Component Value Date/Time   COLORURINE AMBER (A) 08/07/2019 1526   APPEARANCEUR HAZY (A) 08/07/2019 1526   LABSPEC >1.046 (H) 08/07/2019 1526   PHURINE 5.0 08/07/2019 1526   GLUCOSEU NEGATIVE 08/07/2019 1526   HGBUR NEGATIVE 08/07/2019 [place] 08/07/2019 1526   KETONESUR 5 (A) 08/07/2019 1526   PROTEINUR 30 (A) 08/07/2019 1526   NITRITE NEGATIVE 08/07/2019 [place] 08/07/2019 1526   Sepsis Labs: Invalid input(s): PROCALCITONIN, [place]  Microbiology: Recent Results (from the past 240 hour(s))  Blood Culture (routine x 2)     Status: None (Preliminary result)   Collection Time: 08/07/19  3:26 PM   Specimen: BLOOD  Result Value Ref Range Status   Specimen Description   Final    BLOOD BLOOD RIGHT FOREARM Performed at [facility] [address], [place], [place] [address]    Special Requests   Final    BOTTLES DRAWN AEROBIC AND ANAEROBIC Blood Culture results may not be optimal due to an excessive volume of blood received in culture bottles Performed at [facility] [address], [place], [place] [address]    Culture   Final    NO GROWTH < 24 HOURS Performed at [facility] [address], [place], [place] [address]    Report Status PENDING  Incomplete  Urine culture     Status: Abnormal   Collection Time: 08/07/19  3:26 PM   Specimen: In/Out Cath Urine  Result Value Ref Range Status   Specimen Description   Final    IN/OUT CATH URINE Performed at [facility] [address], [place], [place] [address]    Special Requests   Final    NONE Performed at [facility], [place] [address], [place],  [address]    Culture 50,000 COLONIES/mL YEAST (A)  Final   Report Status 08/08/2019 FINAL  Final  SARS Coronavirus 2 [facility] order, Performed in [facility] hospital lab) Nasopharyngeal Nasopharyngeal Swab     Status: None   Collection Time: 08/07/19  3:28 PM   Specimen: Nasopharyngeal Swab  Result Value Ref Range Status   SARS Coronavirus 2 NEGATIVE NEGATIVE Final    Comment: (NOTE) If result is NEGATIVE SARS-CoV-2 target nucleic acids are NOT DETECTED. The SARS-CoV-2 RNA is  generally detectable in upper and lower  respiratory specimens during the acute phase of infection. The lowest  concentration of SARS-CoV-2 viral copies this assay can detect is 250  copies / mL. A negative result does not preclude SARS-CoV-2 infection  and should not be used as the sole basis for treatment or other  patient management decisions.  A negative result may occur  with  improper specimen collection / handling, submission of specimen other  than nasopharyngeal swab, presence of viral mutation(s) within the  areas targeted by this assay, and inadequate number of viral copies  (<250 copies / mL). A negative result must be combined with clinical  observations, patient history, and epidemiological information. If result is POSITIVE SARS-CoV-2 target nucleic acids are DETECTED. The SARS-CoV-2 RNA is generally detectable in upper and lower  respiratory specimens dur ing the acute phase of infection.  Positive  results are indicative of active infection with SARS-CoV-2.  Clinical  correlation with patient history and other diagnostic information is  necessary to determine patient infection status.  Positive results do  not rule out bacterial infection or co-infection with other viruses. If result is PRESUMPTIVE POSTIVE SARS-CoV-2 nucleic acids MAY BE PRESENT.   A presumptive positive result was obtained on the submitted specimen  and confirmed on repeat testing.  While 2019 novel coronavirus  (SARS-CoV-2) nucleic acids may be present in the submitted sample  additional confirmatory testing may be necessary for epidemiological  and / or clinical management purposes  to differentiate between  SARS-CoV-2 and other Sarbecovirus currently known to infect humans.  If clinically indicated additional testing with an alternate test  methodology [phone]) is advised. The SARS-CoV-2 RNA is generally  detectable in upper and lower respiratory sp ecimens during the acute  phase of infection.  The expected result is Negative. Fact Sheet for Patients:  StrictlyIdeas.no Fact Sheet for Healthcare Providers: BankingDealers.co.za This test is not yet approved or cleared by the [place] FDA and has been authorized for detection and/or diagnosis of SARS-CoV-2 by FDA under an Emergency Use Authorization (EUA).  This EUA will remain in effect (meaning this test can be used) for the duration of the COVID-19 declaration under Section 564(b)(1) of the Act, 21 U.S.C. section 360bbb-3(b)(1), unless the authorization is terminated or revoked sooner. Performed at [facility], [place] [address], [place], [place] [address]   Blood Culture (routine x 2)     Status: None (Preliminary result)   Collection Time: 08/07/19  3:31 PM   Specimen: BLOOD  Result Value Ref Range Status   Specimen Description   Final    BLOOD UPPER BLOOD RIGHT FOREARM Performed at [facility] [address], [place], [place] [address]    Special Requests   Final    BOTTLES DRAWN AEROBIC AND ANAEROBIC Blood Culture results may not be optimal due to an excessive volume of blood received in culture bottles Performed at [facility] [address], [place], [place] [address]    Culture   Final    NO GROWTH < 24 HOURS Performed at [facility] [address], [place], [place] [address]    Report Status PENDING  Incomplete  C difficile quick scan w PCR reflex     Status: None   Collection Time: 08/07/19  6:54 PM   Specimen: STOOL  Result Value Ref Range Status   C Diff antigen NEGATIVE NEGATIVE Final   C Diff toxin NEGATIVE NEGATIVE Final   C Diff interpretation No C. difficile detected.  Final    Comment: Performed at [facility], [place] [address] [place], [place] [address]    Radiology Studies: No results found.  Lacora Folmer T. [place]  If 7PM-7AM, please contact  night-coverage [email or web address] Password [email or web address] 08/09/2019, 10:01 AM

## 2019-08-09 NOTE — Progress Notes (Signed)
Subjective/Chief Complaint: Had a better night last night.  Has not yet gone down to IR.  Fever curve down.    Objective: Vital signs in last 24 hours: Temp:  [98 F (36.7 C)-99 F (37.2 C)] 98.7 F (37.1 C) (10/07 0800) Pulse Rate:  [73-92] 75 (10/07 1000) Resp:  [12-26] 21 (10/07 1000) BP: (117-146)/(62-82) 138/82 (10/07 1000) SpO2:  [94 %-100 %] 97 % (10/07 1000) Weight:  [92.3 kg] 92.3 kg (10/07 0500) Last BM Date: 08/08/19  Intake/Output from previous day: 10/06 0701 - 10/07 0700 In: 835.8 [I.V.:293.3; Blood:398.8; IV Piggyback:143.8] Out: 1600 [Urine:1600] Intake/Output this shift: Total I/O In: 28.8 [IV Piggyback:28.8] Out: -   Constitutional: alert and oriented.  Lungs: Normal respiratory effort CV: regular rate and rhythm GI: soft, approp tender. Non distended.   Skin: No rashes, palpation reveals normal turgor Psychiatric: appropriate judgment and insight, oriented to person, place, and time   Lab Results:  Recent Labs    08/08/19 0455  08/08/19 2045 08/09/19 0923  WBC 10.8*  --   --  8.6  HGB 7.0*   < > 7.7* 7.9*  HCT 22.1*   < > 24.1* 24.3*  PLT 139*  --   --  161   < > = values in this interval not displayed.   BMET Recent Labs    08/08/19 0455 08/09/19 0923  NA 135 133*  K 4.2 3.6  CL 106 101  CO2 21* 16*  GLUCOSE 163* 119*  BUN 24* 10  CREATININE 1.25* 0.88  CALCIUM 7.2* 7.9*   PT/INR No results for input(s): LABPROT, INR in the last 72 hours. ABG No results for input(s): PHART, HCO3 in the last 72 hours.  Invalid input(s): PCO2, PO2  Studies/Results: Ct Angio Chest Pe W And/or Wo Contrast  Result Date: 08/07/2019 CLINICAL DATA:  Syncopal episode. History of pancreatic cancer status post Whipple procedure 3 weeks ago. EXAM: CT ANGIOGRAPHY CHEST CT ABDOMEN AND PELVIS WITH CONTRAST TECHNIQUE: Multidetector CT imaging of the chest was performed using the standard protocol during bolus administration of intravenous contrast.  Multiplanar CT image reconstructions and MIPs were obtained to evaluate the vascular anatomy. Multidetector CT imaging of the abdomen and pelvis was performed using the standard protocol during bolus administration of intravenous contrast. CONTRAST:  158mL OMNIPAQUE IOHEXOL 350 MG/ML SOLN COMPARISON:  CT chest, abdomen, and pelvis dated May 11, 2019. FINDINGS: CTA CHEST FINDINGS Cardiovascular: Suboptimal opacification of the pulmonary arteries. No central pulmonary embolism. Normal heart size. No pericardial effusion. Three-vessel coronary atherosclerosis. No thoracic aortic aneurysm or dissection. Unchanged left chest wall port catheter with tip at the cavoatrial junction. Mediastinum/Nodes: No enlarged mediastinal, hilar, or axillary lymph nodes. Thyroid gland, trachea, and esophagus demonstrate no significant findings. Lungs/Pleura: Dependent subsegmental atelectasis in both lower lobes. No focal consolidation, pleural effusion, or pneumothorax. No suspicious pulmonary nodule. Musculoskeletal: No chest wall abnormality. No acute or significant osseous findings. Review of the MIP images confirms the above findings. CT ABDOMEN AND PELVIS FINDINGS Hepatobiliary: No focal liver abnormality. Mild periportal edema. Interval cholecystectomy with choledochojejunostomy. 7.8 x 6.1 x 8.8 cm rim enhancing fluid collection inferior to the right liver. Additional 2.8 x 1.7 cm rim enhancing fluid collection with small focus of gas in the right upper quadrant just inferior to the falciform ligament. Pancreas: Interval resection of the pancreatic head and proximal body with pancreaticojejunostomy. Spleen: Normal in size without focal abnormality. Adrenals/Urinary Tract: The adrenal glands are unremarkable. Unchanged 1.9 cm right renal simple cyst. No  renal calculi or hydronephrosis. Small foci of dependent air within the bladder likely related to recent instrumentation. No bladder wall thickening. Stomach/Bowel: Prior  duodenectomy with gastrojejunostomy. Moderate wall thickening and pericolonic inflammatory changes involving the distal ascending, transverse, and descending colon. Moderate sigmoid diverticulosis. Normal appendix. Jejunostomy feeding tube in place. Vascular/Lymphatic: Aortic atherosclerosis. No enlarged abdominal or pelvic lymph nodes. Reproductive: Prostate is unremarkable. Other: Unchanged small fat containing bilateral inguinal hernias. Trace ascites. No pneumoperitoneum. Musculoskeletal: Open upper abdominal midline wound. No acute or significant osseous findings. Review of the MIP images confirms the above findings. IMPRESSION: Chest: 1. Suboptimal evaluation of the pulmonary arteries. No central pulmonary embolism. If clinical concern remains high, consider V/Q scan for further evaluation. Abdomen and pelvis: 1. Postsurgical changes related to interval Whipple procedure with two rim enhancing fluid collections in the right upper quadrant measuring up to 7.8 x 6.1 x 8.8 cm, concerning for abscesses. 2. Moderate wall thickening and pericolonic inflammatory changes involving the distal ascending, transverse, and descending colon, consistent with colitis. 3.  Aortic atherosclerosis (ICD10-I70.0). Electronically Signed   By: Titus Dubin M.D.   On: 08/07/2019 17:50   Ct Abdomen Pelvis W Contrast  Result Date: 08/07/2019 CLINICAL DATA:  Syncopal episode. History of pancreatic cancer status post Whipple procedure 3 weeks ago. EXAM: CT ANGIOGRAPHY CHEST CT ABDOMEN AND PELVIS WITH CONTRAST TECHNIQUE: Multidetector CT imaging of the chest was performed using the standard protocol during bolus administration of intravenous contrast. Multiplanar CT image reconstructions and MIPs were obtained to evaluate the vascular anatomy. Multidetector CT imaging of the abdomen and pelvis was performed using the standard protocol during bolus administration of intravenous contrast. CONTRAST:  169mL OMNIPAQUE IOHEXOL 350 MG/ML  SOLN COMPARISON:  CT chest, abdomen, and pelvis dated May 11, 2019. FINDINGS: CTA CHEST FINDINGS Cardiovascular: Suboptimal opacification of the pulmonary arteries. No central pulmonary embolism. Normal heart size. No pericardial effusion. Three-vessel coronary atherosclerosis. No thoracic aortic aneurysm or dissection. Unchanged left chest wall port catheter with tip at the cavoatrial junction. Mediastinum/Nodes: No enlarged mediastinal, hilar, or axillary lymph nodes. Thyroid gland, trachea, and esophagus demonstrate no significant findings. Lungs/Pleura: Dependent subsegmental atelectasis in both lower lobes. No focal consolidation, pleural effusion, or pneumothorax. No suspicious pulmonary nodule. Musculoskeletal: No chest wall abnormality. No acute or significant osseous findings. Review of the MIP images confirms the above findings. CT ABDOMEN AND PELVIS FINDINGS Hepatobiliary: No focal liver abnormality. Mild periportal edema. Interval cholecystectomy with choledochojejunostomy. 7.8 x 6.1 x 8.8 cm rim enhancing fluid collection inferior to the right liver. Additional 2.8 x 1.7 cm rim enhancing fluid collection with small focus of gas in the right upper quadrant just inferior to the falciform ligament. Pancreas: Interval resection of the pancreatic head and proximal body with pancreaticojejunostomy. Spleen: Normal in size without focal abnormality. Adrenals/Urinary Tract: The adrenal glands are unremarkable. Unchanged 1.9 cm right renal simple cyst. No renal calculi or hydronephrosis. Small foci of dependent air within the bladder likely related to recent instrumentation. No bladder wall thickening. Stomach/Bowel: Prior duodenectomy with gastrojejunostomy. Moderate wall thickening and pericolonic inflammatory changes involving the distal ascending, transverse, and descending colon. Moderate sigmoid diverticulosis. Normal appendix. Jejunostomy feeding tube in place. Vascular/Lymphatic: Aortic atherosclerosis.  No enlarged abdominal or pelvic lymph nodes. Reproductive: Prostate is unremarkable. Other: Unchanged small fat containing bilateral inguinal hernias. Trace ascites. No pneumoperitoneum. Musculoskeletal: Open upper abdominal midline wound. No acute or significant osseous findings. Review of the MIP images confirms the above findings. IMPRESSION: Chest: 1. Suboptimal evaluation of the  pulmonary arteries. No central pulmonary embolism. If clinical concern remains high, consider V/Q scan for further evaluation. Abdomen and pelvis: 1. Postsurgical changes related to interval Whipple procedure with two rim enhancing fluid collections in the right upper quadrant measuring up to 7.8 x 6.1 x 8.8 cm, concerning for abscesses. 2. Moderate wall thickening and pericolonic inflammatory changes involving the distal ascending, transverse, and descending colon, consistent with colitis. 3.  Aortic atherosclerosis (ICD10-I70.0). Electronically Signed   By: Titus Dubin M.D.   On: 08/07/2019 17:50   Dg Chest Port 1 View  Result Date: 08/07/2019 CLINICAL DATA:  Syncopal episode. EXAM: PORTABLE CHEST 1 VIEW COMPARISON:  Chest CT 08/07/2019 FINDINGS: The cardiac silhouette, mediastinal and hilar contours are within normal limits and stable. The lungs are clear. No pleural effusion. Left subclavian power port in good position, unchanged. The bony thorax is intact. IMPRESSION: No acute cardiopulmonary findings. Electronically Signed   By: Marijo Sanes M.D.   On: 08/07/2019 17:02    Anti-infectives: Anti-infectives (From admission, onward)   Start     Dose/Rate Route Frequency Ordered Stop   08/07/19 2200  piperacillin-tazobactam (ZOSYN) IVPB 3.375 g     3.375 g 12.5 mL/hr over 240 Minutes Intravenous Every 8 hours 08/07/19 2127     08/07/19 1600  vancomycin (VANCOCIN) 2,000 mg in sodium chloride 0.9 % 500 mL IVPB     2,000 mg 250 mL/hr over 120 Minutes Intravenous  Once 08/07/19 1539 08/07/19 2020   08/07/19 1545   ceFEPIme (MAXIPIME) 2 g in sodium chloride 0.9 % 100 mL IVPB     2 g 200 mL/hr over 30 Minutes Intravenous  Once 08/07/19 1539 08/07/19 1956   08/07/19 1530  metroNIDAZOLE (FLAGYL) IVPB 500 mg     500 mg 100 mL/hr over 60 Minutes Intravenous  Once 08/07/19 1527 08/07/19 1956   08/07/19 1530  vancomycin (VANCOCIN) IVPB 1000 mg/200 mL premix  Status:  Discontinued     1,000 mg 200 mL/hr over 60 Minutes Intravenous  Once 08/07/19 1527 08/07/19 1539      Assessment/Plan: S/p whipple/J tube 07/20/2019  P/o course complicated by wound infection.    Went home with 1 drain that was a bit murky.  Cleared up and output  minimal so pulled last week.   Medical history includes DM, chronic kidney dx, h/o LLE DVT, hypertension  Here with sepsis and colitis.    Have asked IR to drain fluid collection- this is scheduled for today.   Will see what gram stain shows.    On zosyn that cover colitis and biliary flora    LOS: 2 days    [name] 08/09/2019

## 2019-08-09 NOTE — Procedures (Signed)
Interventional Radiology Procedure Note  Procedure: CT guided placement of a 67F drain into the RUQ fluid collection with aspiration of 150 mL foul-smelling purulent fluid.  Sample sent for GS & Cx.  Complications: None  Estimated Blood Loss: None  Recommendations: - Drain to JP - Cx pending  Signed,  Criselda Peaches, MD

## 2019-08-10 LAB — GLUCOSE, CAPILLARY
Glucose-Capillary: 116 mg/dL — ABNORMAL HIGH (ref 70–99)
Glucose-Capillary: 101 mg/dL — ABNORMAL HIGH (ref 70–99)
Glucose-Capillary: 198 mg/dL — ABNORMAL HIGH (ref 70–99)
Glucose-Capillary: 241 mg/dL — ABNORMAL HIGH (ref 70–99)
Glucose-Capillary: 233 mg/dL — ABNORMAL HIGH (ref 70–99)

## 2019-08-10 LAB — BASIC METABOLIC PANEL
Anion gap: 7 (ref 5–15)
BUN: 6 mg/dL (ref 6–20)
CO2: 24 mmol/L (ref 22–32)
Calcium: 7.9 mg/dL — ABNORMAL LOW (ref 8.9–10.3)
Chloride: 104 mmol/L (ref 98–111)
Creatinine, Ser: 0.87 mg/dL (ref 0.61–1.24)
GFR calc Af Amer: >60 mL/min (ref >60)
GFR calc non Af Amer: >60 mL/min (ref >60)
Glucose, Bld: 106 mg/dL — ABNORMAL HIGH (ref 70–99)
Potassium: 3.6 mmol/L (ref 3.5–5.1)
Sodium: 135 mmol/L (ref 135–145)

## 2019-08-10 LAB — HEMOGLOBIN AND HEMATOCRIT, BLOOD
HCT: 24 % — ABNORMAL LOW (ref 39.0–52.0)
Hemoglobin: 7.7 g/dL — ABNORMAL LOW (ref 13.0–17.0)

## 2019-08-10 LAB — URINE CULTURE: Culture: NO GROWTH

## 2019-08-10 LAB — MAGNESIUM: Magnesium: 1.7 mg/dL (ref 1.7–2.4)

## 2019-08-10 MED ORDER — DIPHENHYDRAMINE HCL 25 MG PO CAPS
25.0000 mg | ORAL_CAPSULE | Freq: Every evening | ORAL | Status: DC | PRN
  Administered 2019-08-10: 25 mg via ORAL
  Filled 2019-08-10: qty 1

## 2019-08-10 MED ORDER — MAGNESIUM SULFATE 2 GM/50ML IV SOLN
2.0000 g | Freq: Once | INTRAVENOUS | Status: AC
  Administered 2019-08-10: 2 g via INTRAVENOUS
  Filled 2019-08-10: qty 50

## 2019-08-10 MED ORDER — CHLORHEXIDINE GLUCONATE 0.12 % MT SOLN
15.0000 mL | Freq: Two times a day (BID) | OROMUCOSAL | Status: DC
  Administered 2019-08-10 – 2019-08-11 (×3): 15 mL via OROMUCOSAL
  Filled 2019-08-10 (×3): qty 15

## 2019-08-10 MED ORDER — ORAL CARE MOUTH RINSE
15.0000 mL | Freq: Two times a day (BID) | OROMUCOSAL | Status: DC
  Administered 2019-08-10: 16:00:00 15 mL via OROMUCOSAL

## 2019-08-10 MED ORDER — APIXABAN 5 MG PO TABS
5.0000 mg | ORAL_TABLET | Freq: Two times a day (BID) | ORAL | Status: DC
  Administered 2019-08-10 – 2019-08-11 (×3): 5 mg via ORAL
  Filled 2019-08-10 (×3): qty 1

## 2019-08-10 NOTE — Progress Notes (Signed)
PHARMACY NOTE -  [place] has been assisting with dosing of Zosyn for IAI. Dosage remains stable at Zosyn 3.375gm IV Q8h to be infused over 4hrs  and need for further dosage adjustment appears unlikely at present.    Will sign off at this time.  Please reconsult if a change in clinical status warrants re-evaluation of dosage.  Netta Cedars, PharmD, BCPS Pager: [phone] 08/10/2019@9 :45 AM

## 2019-08-10 NOTE — Progress Notes (Signed)
PHARMACY - PHYSICIAN COMMUNICATION CRITICAL VALUE ALERT - BLOOD CULTURE IDENTIFICATION (BCID)  Brian Torres is an 58 y.o. male who presented to [name] on 08/07/2019 with a chief complaint of nausea, vomiting, syncope.   Assessment:  Sepsis due to postsurgical intra-abdominal fluid collection concerning for abscess, colitis. Abscess drained 10/7. 1/4 Blood cx + GNR but nothing detected on BCID.  Name of physician (or Provider) ContactedCyndia Skeeters  Current antibiotics: Zosyn 3.375gm IV Q8h to be infused over 4hrs  Changes to prescribed antibiotics recommended:  Patient is on recommended antibiotics - No changes needed  Results for orders placed or performed during the hospital encounter of 08/07/19  Blood Culture ID Panel (Reflexed) (Collected: 08/07/2019  3:31 PM)  Result Value Ref Range   Enterococcus species NOT DETECTED NOT DETECTED   Listeria monocytogenes NOT DETECTED NOT DETECTED   Staphylococcus species NOT DETECTED NOT DETECTED   Staphylococcus aureus (BCID) NOT DETECTED NOT DETECTED   Streptococcus species NOT DETECTED NOT DETECTED   Streptococcus agalactiae NOT DETECTED NOT DETECTED   Streptococcus pneumoniae NOT DETECTED NOT DETECTED   Streptococcus pyogenes NOT DETECTED NOT DETECTED   Acinetobacter baumannii NOT DETECTED NOT DETECTED   Enterobacteriaceae species NOT DETECTED NOT DETECTED   Enterobacter cloacae complex NOT DETECTED NOT DETECTED   Escherichia coli NOT DETECTED NOT DETECTED   Klebsiella oxytoca NOT DETECTED NOT DETECTED   Klebsiella pneumoniae NOT DETECTED NOT DETECTED   Proteus species NOT DETECTED NOT DETECTED   Serratia marcescens NOT DETECTED NOT DETECTED   Haemophilus influenzae NOT DETECTED NOT DETECTED   Neisseria meningitidis NOT DETECTED NOT DETECTED   Pseudomonas aeruginosa NOT DETECTED NOT DETECTED   Candida albicans NOT DETECTED NOT DETECTED   Candida glabrata NOT DETECTED NOT DETECTED   Candida krusei NOT DETECTED NOT DETECTED   Candida  parapsilosis NOT DETECTED NOT DETECTED   Candida tropicalis NOT DETECTED NOT DETECTED    Netta Cedars, PharmD, BCPS 08/10/2019  12:12 PM

## 2019-08-10 NOTE — Progress Notes (Addendum)
Subjective/Chief Complaint: IR drain placed yesterday.  Fluid noted to be foul smelling.  150 aspirated at time of drain placement.  Didn;t sleep well last night.    Objective: Vital signs in last 24 hours: Temp:  [97.8 F (36.6 C)-98.3 F (36.8 C)] 98.1 F (36.7 C) (10/08 1438) Pulse Rate:  [62-80] 68 (10/08 1438) Resp:  [12-26] 18 (10/08 1438) BP: (124-158)/(68-87) 140/87 (10/08 1438) SpO2:  [94 %-99 %] 99 % (10/08 1438) Weight:  [92.1 kg] 92.1 kg (10/08 0500) Last BM Date: 08/08/19  Intake/Output from previous day: 10/07 0701 - 10/08 0700 In: 171.8 [I.V.:5; IV Piggyback:156.8] Out: 1425 [Urine:1325; Drains:100] Intake/Output this shift: Total I/O In: 110.7 [I.V.:8; Other:5; IV Piggyback:97.7] Out: 670 [Urine:650; Drains:20]  Constitutional: alert and oriented.  Lungs: Normal respiratory effort CV: regular rate and rhythm GI: soft, approp tender. Non distended.  Drain murky. Skin: No rashes, palpation reveals normal turgor Psychiatric: appropriate judgment and insight, oriented to person, place, and time   Lab Results:  Recent Labs    08/08/19 0455  08/09/19 0923 08/10/19 0500  WBC 10.8*  --  8.6  --   HGB 7.0*   < > 7.9* 7.7*  HCT 22.1*   < > 24.3* 24.0*  PLT 139*  --  161  --    < > = values in this interval not displayed.   BMET Recent Labs    08/09/19 0923 08/10/19 0500  NA 133* 135  K 3.6 3.6  CL 101 104  CO2 16* 24  GLUCOSE 119* 106*  BUN 10 6  CREATININE 0.88 0.87  CALCIUM 7.9* 7.9*   PT/INR No results for input(s): LABPROT, INR in the last 72 hours. ABG No results for input(s): PHART, HCO3 in the last 72 hours.  Invalid input(s): PCO2, PO2  Studies/Results: Ct Image Guided Fluid Drain By Catheter  Result Date: 08/09/2019 INDICATION: 58 year old male with postoperative fluid collection in the right upper quadrant status post Whipple procedure. He presents for CT-guided drain placement. EXAM: CT-guided drain placement MEDICATIONS: The  patient is currently admitted to the hospital and receiving intravenous antibiotics. The antibiotics were administered within an appropriate time frame prior to the initiation of the procedure. ANESTHESIA/SEDATION: Fentanyl 100 mcg IV; Versed 4 mg IV Moderate Sedation Time:  21 minutes The patient was continuously monitored during the procedure by the interventional radiology nurse under my direct supervision. COMPLICATIONS: None immediate. PROCEDURE: Informed written consent was obtained from the patient after a thorough discussion of the procedural risks, benefits and alternatives. All questions were addressed. Maximal Sterile Barrier Technique was utilized including caps, mask, sterile gowns, sterile gloves, sterile drape, hand hygiene and skin antiseptic. A timeout was performed prior to the initiation of the procedure. A planning axial CT scan was performed. The fluid collection in the right upper quadrant was successfully localized. An appropriate skin entry site was selected and marked. The overlying skin was sterilely prepped and draped in the standard fashion using chlorhexidine skin prep. Local anesthesia was attained by infiltration with 1% lidocaine. A 22 gauge spinal needle was then advanced through the abdominal wall and imaged. Using this trajectory and the tandem trocar technique, a 12 French cook all-purpose drainage catheter was advanced through the abdominal wall and into the fluid collection. The drainage catheter was formed and aspiration was performed. Aspiration yields 150 mL foul-smelling frankly purulent fluid. A sample was sent for Gram stain and culture. The abscess cavity was then lavaged with 20 mL sterile saline and the drain  hooked to JP bulb suction. The drain was then secured to the skin with 0 Prolene suture. A sterile bandage was applied. IMPRESSION: Successful placement of a 40 French percutaneous drainage catheter into the intra-abdominal fluid collection. Aspiration yields 200  mL frankly purulent foul-smelling fluid. A sample was sent for Gram stain and culture. PLAN: 1. Recommend repeat CT scan of the abdomen with intravenous contrast when drain output has become minimal for 48 hours. Signed, Criselda Peaches, MD, [facility] Radiology Electronically Signed   By: Jacqulynn Cadet M.D.   On: 08/09/2019 15:36    Anti-infectives: Anti-infectives (From admission, onward)   Start     Dose/Rate Route Frequency Ordered Stop   08/07/19 2200  piperacillin-tazobactam (ZOSYN) IVPB 3.375 g     3.375 g 12.5 mL/hr over 240 Minutes Intravenous Every 8 hours 08/07/19 2127     08/07/19 1600  vancomycin (VANCOCIN) 2,000 mg in sodium chloride 0.9 % 500 mL IVPB     2,000 mg 250 mL/hr over 120 Minutes Intravenous  Once 08/07/19 1539 08/07/19 2020   08/07/19 1545  ceFEPIme (MAXIPIME) 2 g in sodium chloride 0.9 % 100 mL IVPB     2 g 200 mL/hr over 30 Minutes Intravenous  Once 08/07/19 1539 08/07/19 1956   08/07/19 1530  metroNIDAZOLE (FLAGYL) IVPB 500 mg     500 mg 100 mL/hr over 60 Minutes Intravenous  Once 08/07/19 1527 08/07/19 1956   08/07/19 1530  vancomycin (VANCOCIN) IVPB 1000 mg/200 mL premix  Status:  Discontinued     1,000 mg 200 mL/hr over 60 Minutes Intravenous  Once 08/07/19 1527 08/07/19 1539      Assessment/Plan: S/p whipple/J tube 07/20/2019  P/o course complicated by wound infection.     Here with sepsis and colitis.    Perc drain - polymicrobial- not surprising.  Await culture. Would typically tx with augmentin or cipro/flagyl or bactrim upon discharge unless something resistant grows out.    Had 1 blood culture positive for GNR.  Discussed with Dr. Cyndia Skeeters.  Repeat cx have been drawn.  Awaiting speciation to make sure it is not a bug that is resistant.    On zosyn/vanc    LOS: 3 days    [name] 08/10/2019

## 2019-08-10 NOTE — Progress Notes (Signed)
Report called to [place], RN on [place].  Patient will be transferred via wheelchair.

## 2019-08-10 NOTE — Progress Notes (Signed)
Patient ID: Brian Torres, male   DOB: 10-Jun-1961, 58 y.o.   MRN: [id number]    Referring Physician(s): Dr. Barry Dienes  Supervising Physician: Jacqulynn Cadet  Patient Status:  [facility] - In-pt  Chief Complaint:  Intraabdominal abscess, drain placement  Subjective: Patient denies any pain, fever, vomiting. Patient has at home nurse 3d per week. He wishes to return home in her care asap.   Allergies: Patient has no known allergies.  Medications: Prior to Admission medications   Medication Sig Start Date End Date Taking? Authorizing Provider  acetaminophen (TYLENOL) 500 MG tablet Take 1,000 mg by mouth every 6 (six) hours as needed for moderate pain or headache.   Yes [provider]  Amino Acids-Protein Hydrolys (FEEDING SUPPLEMENT, PRO-STAT SUGAR FREE 64,) LIQD Place 30 mLs into feeding tube 3 (three) times daily. 07/28/19  Yes [name], MD  amoxicillin-clavulanate (AUGMENTIN) 875-125 MG tablet Take 1 tablet by mouth every 12 (twelve) hours. 07/28/19  Yes [name], MD  apixaban (ELIQUIS) 5 MG TABS tablet Take 1 tablet (5 mg total) by mouth 2 (two) times daily. 01/04/19  Yes Arta Silence, MD  b complex vitamins tablet Take 1 tablet by mouth daily.   Yes [provider]  CINNAMON PO Take 1,000 mg by mouth 2 (two) times daily.   Yes [provider]  Coenzyme Q10 (COQ-10 PO) Take 1 tablet by mouth daily.   Yes [provider]  famotidine (PEPCID AC) 10 MG tablet Take 10 mg by mouth 2 (two) times daily as needed for heartburn or indigestion.   Yes [provider]  feeding supplement, ENSURE ENLIVE, (ENSURE ENLIVE) LIQD Take 237 mLs by mouth 3 (three) times daily between meals. 07/28/19  Yes [name], MD  ferrous sulfate 325 (65 FE) MG EC tablet Take 325 mg by mouth 2 (two) times a day.    Yes [provider]  glimepiride (AMARYL) 4 MG tablet Take 4 mg by mouth daily with breakfast.  10/13/17  Yes [provider]    HUMALOG KWIKPEN 100 UNIT/ML KwikPen Inject 3-13 Units into the skin 2 (two) times daily as needed (if bs is over 150).  06/29/19  Yes [provider]  Insulin Glargine (BASAGLAR KWIKPEN) 100 UNIT/ML SOPN Inject 19 Units into the skin at bedtime.  10/19/18  Yes [provider]  lisinopril (PRINIVIL,ZESTRIL) 10 MG tablet Take 10 mg by mouth daily. 10/13/17  Yes [provider]  LORazepam (ATIVAN) 0.5 MG tablet Take 1 tablet (0.5 mg total) by mouth every 8 (eight) hours as needed (for nausea). 05/01/19  Yes Ladell Pier, MD  metFORMIN (GLUCOPHAGE) 1000 MG tablet Take 1,000 mg by mouth 2 (two) times daily. 10/13/17  Yes [provider]  methocarbamol (ROBAXIN) 500 MG tablet Take 1 tablet (500 mg total) by mouth every 6 (six) hours as needed for muscle spasms. 07/28/19  Yes [name], MD  Omega-3 Fatty Acids (FISH OIL) 1000 MG CAPS Take 1,000 mg by mouth 2 (two) times daily.    Yes [provider]  ondansetron (ZOFRAN-ODT) 4 MG disintegrating tablet Take 1 tablet (4 mg total) by mouth every 6 (six) hours as needed for nausea. 07/28/19  Yes [name], MD  oxyCODONE (OXY IR/ROXICODONE) 5 MG immediate release tablet Take 1-2 tablets (5-10 mg total) by mouth every 4 (four) hours as needed for moderate pain, severe pain or breakthrough pain. 07/28/19  Yes [name], MD  pioglitazone (ACTOS) 45 MG tablet Take 45 mg by mouth  daily. 10/13/17  Yes [provider]  prochlorperazine (COMPAZINE) 10 MG tablet Take 1 tablet (10 mg total) by mouth every 6 (six) hours as needed for nausea or vomiting. 04/14/19  Yes Ladell Pier, MD  lidocaine-prilocaine (EMLA) cream Apply 1 application topically as needed. Patient taking differently: Apply 1 application topically as needed (port access).  01/26/19   Ladell Pier, MD  magic mouthwash SOLN Take 5 mLs by mouth 4 (four) times daily as needed for mouth pain. Swish and spit Patient not taking: Reported on  08/07/2019 06/06/19   Owens Shark, NP     Vital Signs: BP (!) 149/73    Pulse 80    Temp 98.3 F (36.8 C) (Oral)    Resp (!) 24    Ht 6\' 4"  (1.93 m)    Wt 203 lb 0.7 oz (92.1 kg)    SpO2 95%    BMI 24.72 kg/m   Physical Exam  Awake and alert. In no acute distress. 25cc thick serosanguinous fluid. 5cc NS flushed without resistance and continuous drain output. Incision clean/dry.  Imaging: Ct Angio Chest Pe W And/or Wo Contrast  Result Date: 08/07/2019 CLINICAL DATA:  Syncopal episode. History of pancreatic cancer status post Whipple procedure 3 weeks ago. EXAM: CT ANGIOGRAPHY CHEST CT ABDOMEN AND PELVIS WITH CONTRAST TECHNIQUE: Multidetector CT imaging of the chest was performed using the standard protocol during bolus administration of intravenous contrast. Multiplanar CT image reconstructions and MIPs were obtained to evaluate the vascular anatomy. Multidetector CT imaging of the abdomen and pelvis was performed using the standard protocol during bolus administration of intravenous contrast. CONTRAST:  139mL OMNIPAQUE IOHEXOL 350 MG/ML SOLN COMPARISON:  CT chest, abdomen, and pelvis dated May 11, 2019. FINDINGS: CTA CHEST FINDINGS Cardiovascular: Suboptimal opacification of the pulmonary arteries. No central pulmonary embolism. Normal heart size. No pericardial effusion. Three-vessel coronary atherosclerosis. No thoracic aortic aneurysm or dissection. Unchanged left chest wall port catheter with tip at the cavoatrial junction. Mediastinum/Nodes: No enlarged mediastinal, hilar, or axillary lymph nodes. Thyroid gland, trachea, and esophagus demonstrate no significant findings. Lungs/Pleura: Dependent subsegmental atelectasis in both lower lobes. No focal consolidation, pleural effusion, or pneumothorax. No suspicious pulmonary nodule. Musculoskeletal: No chest wall abnormality. No acute or significant osseous findings. Review of the MIP images confirms the above findings. CT ABDOMEN AND PELVIS FINDINGS  Hepatobiliary: No focal liver abnormality. Mild periportal edema. Interval cholecystectomy with choledochojejunostomy. 7.8 x 6.1 x 8.8 cm rim enhancing fluid collection inferior to the right liver. Additional 2.8 x 1.7 cm rim enhancing fluid collection with small focus of gas in the right upper quadrant just inferior to the falciform ligament. Pancreas: Interval resection of the pancreatic head and proximal body with pancreaticojejunostomy. Spleen: Normal in size without focal abnormality. Adrenals/Urinary Tract: The adrenal glands are unremarkable. Unchanged 1.9 cm right renal simple cyst. No renal calculi or hydronephrosis. Small foci of dependent air within the bladder likely related to recent instrumentation. No bladder wall thickening. Stomach/Bowel: Prior duodenectomy with gastrojejunostomy. Moderate wall thickening and pericolonic inflammatory changes involving the distal ascending, transverse, and descending colon. Moderate sigmoid diverticulosis. Normal appendix. Jejunostomy feeding tube in place. Vascular/Lymphatic: Aortic atherosclerosis. No enlarged abdominal or pelvic lymph nodes. Reproductive: Prostate is unremarkable. Other: Unchanged small fat containing bilateral inguinal hernias. Trace ascites. No pneumoperitoneum. Musculoskeletal: Open upper abdominal midline wound. No acute or significant osseous findings. Review of the MIP images confirms the above findings. IMPRESSION: Chest: 1. Suboptimal evaluation of the pulmonary arteries. No central pulmonary embolism.  If clinical concern remains high, consider V/Q scan for further evaluation. Abdomen and pelvis: 1. Postsurgical changes related to interval Whipple procedure with two rim enhancing fluid collections in the right upper quadrant measuring up to 7.8 x 6.1 x 8.8 cm, concerning for abscesses. 2. Moderate wall thickening and pericolonic inflammatory changes involving the distal ascending, transverse, and descending colon, consistent with colitis.  3.  Aortic atherosclerosis (ICD10-I70.0). Electronically Signed   By: Titus Dubin M.D.   On: 08/07/2019 17:50   Ct Abdomen Pelvis W Contrast  Result Date: 08/07/2019 CLINICAL DATA:  Syncopal episode. History of pancreatic cancer status post Whipple procedure 3 weeks ago. EXAM: CT ANGIOGRAPHY CHEST CT ABDOMEN AND PELVIS WITH CONTRAST TECHNIQUE: Multidetector CT imaging of the chest was performed using the standard protocol during bolus administration of intravenous contrast. Multiplanar CT image reconstructions and MIPs were obtained to evaluate the vascular anatomy. Multidetector CT imaging of the abdomen and pelvis was performed using the standard protocol during bolus administration of intravenous contrast. CONTRAST:  17mL OMNIPAQUE IOHEXOL 350 MG/ML SOLN COMPARISON:  CT chest, abdomen, and pelvis dated May 11, 2019. FINDINGS: CTA CHEST FINDINGS Cardiovascular: Suboptimal opacification of the pulmonary arteries. No central pulmonary embolism. Normal heart size. No pericardial effusion. Three-vessel coronary atherosclerosis. No thoracic aortic aneurysm or dissection. Unchanged left chest wall port catheter with tip at the cavoatrial junction. Mediastinum/Nodes: No enlarged mediastinal, hilar, or axillary lymph nodes. Thyroid gland, trachea, and esophagus demonstrate no significant findings. Lungs/Pleura: Dependent subsegmental atelectasis in both lower lobes. No focal consolidation, pleural effusion, or pneumothorax. No suspicious pulmonary nodule. Musculoskeletal: No chest wall abnormality. No acute or significant osseous findings. Review of the MIP images confirms the above findings. CT ABDOMEN AND PELVIS FINDINGS Hepatobiliary: No focal liver abnormality. Mild periportal edema. Interval cholecystectomy with choledochojejunostomy. 7.8 x 6.1 x 8.8 cm rim enhancing fluid collection inferior to the right liver. Additional 2.8 x 1.7 cm rim enhancing fluid collection with small focus of gas in the right upper  quadrant just inferior to the falciform ligament. Pancreas: Interval resection of the pancreatic head and proximal body with pancreaticojejunostomy. Spleen: Normal in size without focal abnormality. Adrenals/Urinary Tract: The adrenal glands are unremarkable. Unchanged 1.9 cm right renal simple cyst. No renal calculi or hydronephrosis. Small foci of dependent air within the bladder likely related to recent instrumentation. No bladder wall thickening. Stomach/Bowel: Prior duodenectomy with gastrojejunostomy. Moderate wall thickening and pericolonic inflammatory changes involving the distal ascending, transverse, and descending colon. Moderate sigmoid diverticulosis. Normal appendix. Jejunostomy feeding tube in place. Vascular/Lymphatic: Aortic atherosclerosis. No enlarged abdominal or pelvic lymph nodes. Reproductive: Prostate is unremarkable. Other: Unchanged small fat containing bilateral inguinal hernias. Trace ascites. No pneumoperitoneum. Musculoskeletal: Open upper abdominal midline wound. No acute or significant osseous findings. Review of the MIP images confirms the above findings. IMPRESSION: Chest: 1. Suboptimal evaluation of the pulmonary arteries. No central pulmonary embolism. If clinical concern remains high, consider V/Q scan for further evaluation. Abdomen and pelvis: 1. Postsurgical changes related to interval Whipple procedure with two rim enhancing fluid collections in the right upper quadrant measuring up to 7.8 x 6.1 x 8.8 cm, concerning for abscesses. 2. Moderate wall thickening and pericolonic inflammatory changes involving the distal ascending, transverse, and descending colon, consistent with colitis. 3.  Aortic atherosclerosis (ICD10-I70.0). Electronically Signed   By: Titus Dubin M.D.   On: 08/07/2019 17:50   Dg Chest Port 1 View  Result Date: 08/07/2019 CLINICAL DATA:  Syncopal episode. EXAM: PORTABLE CHEST 1 VIEW COMPARISON:  Chest CT 08/07/2019 FINDINGS: The cardiac silhouette,  mediastinal and hilar contours are within normal limits and stable. The lungs are clear. No pleural effusion. Left subclavian power port in good position, unchanged. The bony thorax is intact. IMPRESSION: No acute cardiopulmonary findings. Electronically Signed   By: Marijo Sanes M.D.   On: 08/07/2019 17:02   Ct Image Guided Fluid Drain By Catheter  Result Date: 08/09/2019 INDICATION: 58 year old male with postoperative fluid collection in the right upper quadrant status post Whipple procedure. He presents for CT-guided drain placement. EXAM: CT-guided drain placement MEDICATIONS: The patient is currently admitted to the hospital and receiving intravenous antibiotics. The antibiotics were administered within an appropriate time frame prior to the initiation of the procedure. ANESTHESIA/SEDATION: Fentanyl 100 mcg IV; Versed 4 mg IV Moderate Sedation Time:  21 minutes The patient was continuously monitored during the procedure by the interventional radiology nurse under my direct supervision. COMPLICATIONS: None immediate. PROCEDURE: Informed written consent was obtained from the patient after a thorough discussion of the procedural risks, benefits and alternatives. All questions were addressed. Maximal Sterile Barrier Technique was utilized including caps, mask, sterile gowns, sterile gloves, sterile drape, hand hygiene and skin antiseptic. A timeout was performed prior to the initiation of the procedure. A planning axial CT scan was performed. The fluid collection in the right upper quadrant was successfully localized. An appropriate skin entry site was selected and marked. The overlying skin was sterilely prepped and draped in the standard fashion using chlorhexidine skin prep. Local anesthesia was attained by infiltration with 1% lidocaine. A 22 gauge spinal needle was then advanced through the abdominal wall and imaged. Using this trajectory and the tandem trocar technique, a 12 French cook all-purpose  drainage catheter was advanced through the abdominal wall and into the fluid collection. The drainage catheter was formed and aspiration was performed. Aspiration yields 150 mL foul-smelling frankly purulent fluid. A sample was sent for Gram stain and culture. The abscess cavity was then lavaged with 20 mL sterile saline and the drain hooked to JP bulb suction. The drain was then secured to the skin with 0 Prolene suture. A sterile bandage was applied. IMPRESSION: Successful placement of a 6 French percutaneous drainage catheter into the intra-abdominal fluid collection. Aspiration yields 200 mL frankly purulent foul-smelling fluid. A sample was sent for Gram stain and culture. PLAN: 1. Recommend repeat CT scan of the abdomen with intravenous contrast when drain output has become minimal for 48 hours. Signed, Criselda Peaches, MD, [name] Electronically Signed   By: Jacqulynn Cadet M.D.   On: 08/09/2019 15:36    Labs:  CBC: Recent Labs    07/27/19 0127 08/07/19 1526 08/08/19 0455 08/08/19 1200 08/08/19 2045 08/09/19 0923 08/10/19 0500  WBC 7.1 6.7 10.8*  --   --  8.6  --   HGB 8.2* 9.1* 7.0* 7.0* 7.7* 7.9* 7.7*  HCT 24.9* 27.9* 22.1* 21.7* 24.1* 24.3* 24.0*  PLT 194 193 139*  --   --  161  --     COAGS: Recent Labs    12/19/18 2153 12/20/18 0626 12/20/18 1935 12/22/18 0159 12/23/18 0014 07/12/19 0959 07/20/19 1610  INR 1.47 1.22  --   --   --  1.0 1.1  APTT  --  33 92* 80* 65*  --   --     BMP: Recent Labs    08/07/19 1526 08/08/19 0455 08/09/19 0923 08/10/19 0500  NA 131* 135 133* 135  K 3.3* 4.2 3.6 3.6  CL 100 106 101 104  CO2 21* 21* 16* 24  GLUCOSE 175* 163* 119* 106*  BUN 23* 24* 10 6  CALCIUM 7.9* 7.2* 7.9* 7.9*  CREATININE 1.24 1.25* 0.88 0.87  GFRNONAA >60 >60 >60 >60  GFRAA >60 >60 >60 >60    LIVER FUNCTION TESTS: Recent Labs    07/25/19 0500 07/27/19 0127 08/07/19 1526  08/09/19 0923  BILITOT 0.8 0.7 0.7 0.3  AST 24 86* 27 18  ALT 24 74* 23 19  ALKPHOS 190* 380* 262* 210*  PROT 5.2* 5.3* 6.3* 6.0*  ALBUMIN 2.4* 2.2* 2.6* 2.2*    Assessment and Plan: Pancreatic cancer post Whipple procedure 9/17. Laparotomy wound clean. Patient septic likely due to intra-abdominal abscess. CT guided aspiration and drain placed 10/7. Cultures GPR/GNR. Repeat blood cultures pending. Once output minimal for 48 hours will repeat CT. Continue to monitor drain output with flush.  Electronically Signed: D. Rowe Robert, PA-C/Chelsea Cyr PA-S 08/10/2019, 1:29 PM   I spent a total of 15 Minutes at the the patient's bedside AND on the patient's hospital floor or unit, greater than 50% of which was counseling/coordinating care for RUQ drain

## 2019-08-10 NOTE — Progress Notes (Signed)
PROGRESS NOTE  Brian Torres [id number] DOB: 25-Jan-1961   PCP: Cari Caraway, MD  Patient is from: Home  DOA: 08/07/2019 LOS: 3  Brief Narrative / Interim history: 58 y.o. male with medical history significant for pancreatic cancer s/p Whipple procedure 07/20/2019, IDT2DM, HTN, HLD, Anemia, Hx of LLE DVT on Eliquis who presents to the ED for evaluation of nausea, vomiting, and near syncopal episode, and admitted for sepsis due to postsurgical intra-abdominal fluid collection concerning for abscesses, and colitis.  Started around vancomycin, cefepime and Flagyl in ED.  Transitioned to Zosyn on admission.  C. difficile negative.  General surgery and IR consulted.  Patient had CT-guided aspiration of intra-abdominal fluid collection and drain placement.  Aspiration yielded 200 cc purulent foul-smelling fluid.  Gram stain on fluid with GNR and GPR.  Update on blood culture with GNR in 1 of the 2 bottles.  Reintubated for better gross.  Subjective: No major events overnight of this morning.  He states he has difficulty resting and sleeping here and would do better at home if he could be discharged.  However, he understands that he has to stay until it is safe to let him go home.  He has no other complaints.  He denies chest pain, dyspnea, abdominal pain or UTI symptoms.  Objective: Vitals:   08/10/19 0800 08/10/19 0900 08/10/19 1000 08/10/19 1204  BP: (!) 148/86 (!) 156/83 (!) 149/73   Pulse: 62 70 80   Resp: 13 20 (!) 24   Temp: 97.9 F (36.6 C)   98.3 F (36.8 C)  TempSrc: Oral   Oral  SpO2: 97% 97% 95%   Weight:      Height:        Intake/Output Summary (Last 24 hours) at 08/10/2019 1238 Last data filed at 08/10/2019 1030 Gross per 24 hour  Intake 238.53 ml  Output 1800 ml  Net -1561.47 ml   Filed Weights   08/08/19 0500 08/09/19 0500 08/10/19 0500  Weight: 92.3 kg 92.3 kg 92.1 kg    Examination:  GENERAL: No acute distress.  Resting comfortably. HEENT: MMM.  Vision  and hearing grossly intact.  NECK: Supple.  No apparent JVD.  RESP:  No IWOB. Good air movement bilaterally. CVS:  RRR. Heart sounds normal.  ABD/GI/GU: Bowel sounds present. Soft. Non tender.  Dressing over laparotomy wound.  J-tube and drain in place.  Some purulent material in JP drain with blood-tinged. MSK/EXT:  Moves extremities. No apparent deformity or edema.  SKIN: no apparent skin lesion or wound NEURO: Awake, alert and oriented appropriately.  No gross deficit.  PSYCH: Calm. Normal affect.   Assessment & Plan: Sepsis due to suspected intra-abdominal abscess, colitis and GNR bacteremia.   -patient was febrile, tachycardic with low blood pressures on admission.  Sepsis physiology resolved.  -CT abdomen and pelvis concerning for RUQ fluid collection/abscess. -C. difficile negative.  Blood culture with GNR in 1/2 bottles. -CT-guided aspiration and drain placement for intra-abdominal abscess 10/7.  Gram stain with GPR and GNR. -Follow cultures -Repeat blood culture. -Continue IV Zosyn.  -Resume home Eliquis.  Pancreatic cancer status post Whipple procedure on 07/20/2019: Still with open laparotomy wound.  Follows with oncology, Dr. Learta Codding.  Completed neoadjuvant FOLFIRINOX therapy.  Laparotomy wound appears clean -General surgery following.  IDDM-2 with hyperglycemia.  CBG within fair range. -Continue current regimen  History of LLE DVT -Resume Eliquis.  Anemia of chronic disease:  denies hematochezia or melena.  Initial drop likely dilutional from IV fluid -Hgb 9.1 (  admit)>7.0>1u> 7.7 -Monitor H&H.  Anion gap metabolic acidosis: Due to malnutrition?  Resolved. -Optimize nutrition.  Appreciate dietitian input.  Essential hypertension: Normotensive. -Continue holding home lisinopril  Hypokalemia/hyponatremia/hypomagnesemia: Resolved.  Thrombocytopenia: Likely dilutional. -Continue monitoring  Tobacco use disorder: -Encourage cessation -Nicotine patch.  Funguria:  Urine culture grew 50,000 colonies of yeast.  Differential includes contamination, colonization or possible infection but patient has no UTI symptoms.  -Follow repeat urine culture.  DVT prophylaxis: SCD Code Status: Full code Family Communication: Updated patient's wife over the phone 10/6. Disposition Plan: Remains inpatient.  Transfer to [place]. Consultants: GI, IR  Procedures:  None  Microbiology summarized: SARS-CoV-2 screen negative. C. difficile PCR negative. Blood culture-GNR in 1 out of 2 bottles. Urine culture 50,000 colonies of yeast.  Antimicrobials: Anti-infectives (From admission, onward)   Start     Dose/Rate Route Frequency Ordered Stop   08/07/19 2200  piperacillin-tazobactam (ZOSYN) IVPB 3.375 g     3.375 g 12.5 mL/hr over 240 Minutes Intravenous Every 8 hours 08/07/19 2127     08/07/19 1600  vancomycin (VANCOCIN) 2,000 mg in sodium chloride 0.9 % 500 mL IVPB     2,000 mg 250 mL/hr over 120 Minutes Intravenous  Once 08/07/19 1539 08/07/19 2020   08/07/19 1545  ceFEPIme (MAXIPIME) 2 g in sodium chloride 0.9 % 100 mL IVPB     2 g 200 mL/hr over 30 Minutes Intravenous  Once 08/07/19 1539 08/07/19 1956   08/07/19 1530  metroNIDAZOLE (FLAGYL) IVPB 500 mg     500 mg 100 mL/hr over 60 Minutes Intravenous  Once 08/07/19 1527 08/07/19 1956   08/07/19 1530  vancomycin (VANCOCIN) IVPB 1000 mg/200 mL premix  Status:  Discontinued     1,000 mg 200 mL/hr over 60 Minutes Intravenous  Once 08/07/19 1527 08/07/19 1539      Sch Meds:  Scheduled Meds:  chlorhexidine  15 mL Mouth Rinse BID   Chlorhexidine Gluconate Cloth  6 each Topical Daily   feeding supplement (ENSURE ENLIVE)  237 mL Oral TID BM   ferrous sulfate  325 mg Oral BID WC   insulin aspart  0-9 Units Subcutaneous Q4H   insulin glargine  8 Units Subcutaneous QHS   mouth rinse  15 mL Mouth Rinse q12n4p   nicotine  7 mg Transdermal Daily   sodium chloride flush  3 mL Intravenous Q12H   sodium  chloride flush  5 mL Intracatheter Q8H   Continuous Infusions:  sodium chloride 5 mL/hr at 08/07/19 2020   piperacillin-tazobactam (ZOSYN)  IV Stopped (08/10/19 0924)   PRN Meds:.sodium chloride, acetaminophen **OR** acetaminophen, ondansetron **OR** ondansetron (ZOFRAN) IV, sodium chloride flush   I have personally reviewed the following labs and images: CBC: Recent Labs  Lab 08/07/19 1526 08/08/19 0455 08/08/19 1200 08/08/19 2045 08/09/19 0923 08/10/19 0500  WBC 6.7 10.8*  --   --  8.6  --   NEUTROABS 6.5  --   --   --   --   --   HGB 9.1* 7.0* 7.0* 7.7* 7.9* 7.7*  HCT 27.9* 22.1* 21.7* 24.1* 24.3* 24.0*  MCV 92.4 94.0  --   --  91.0  --   PLT 193 139*  --   --  161  --    BMP &GFR Recent Labs  Lab 08/07/19 1526 08/07/19 1607 08/08/19 0455 08/09/19 0923 08/10/19 0500  NA 131*  --  135 133* 135  K 3.3*  --  4.2 3.6 3.6  CL 100  --  106  101 104  CO2 21*  --  21* 16* 24  GLUCOSE 175*  --  163* 119* 106*  BUN 23*  --  24* 10 6  CREATININE 1.24  --  1.25* 0.88 0.87  CALCIUM 7.9*  --  7.2* 7.9* 7.9*  MG  --  1.2* 1.9 1.6* 1.7  PHOS  --   --   --  2.6  --    Estimated Creatinine Clearance: 113.6 mL/min (by C-G formula based on SCr of 0.87 mg/dL). Liver & Pancreas: Recent Labs  Lab 08/07/19 1526 08/09/19 0923  AST 27 18  ALT 23 19  ALKPHOS 262* 210*  BILITOT 0.7 0.3  PROT 6.3* 6.0*  ALBUMIN 2.6* 2.2*   Recent Labs  Lab 08/07/19 1526  LIPASE 15   No results for input(s): AMMONIA in the last 168 hours. Diabetic: No results for input(s): HGBA1C in the last 72 hours. Recent Labs  Lab 08/09/19 1940 08/09/19 2320 08/10/19 0351 08/10/19 0832 08/10/19 1201  GLUCAP 185* 147* 116* 101* 198*   Cardiac Enzymes: No results for input(s): CKTOTAL, CKMB, CKMBINDEX, TROPONINI in the last 168 hours. No results for input(s): PROBNP in the last 8760 hours. Coagulation Profile: No results for input(s): INR, PROTIME in the last 168 hours. Thyroid Function  Tests: No results for input(s): TSH, T4TOTAL, FREET4, T3FREE, THYROIDAB in the last 72 hours. Lipid Profile: No results for input(s): CHOL, HDL, LDLCALC, TRIG, CHOLHDL, LDLDIRECT in the last 72 hours. Anemia Panel: No results for input(s): VITAMINB12, FOLATE, FERRITIN, TIBC, IRON, RETICCTPCT in the last 72 hours. Urine analysis:    Component Value Date/Time   COLORURINE AMBER (A) 08/07/2019 1526   APPEARANCEUR HAZY (A) 08/07/2019 1526   LABSPEC >1.046 (H) 08/07/2019 1526   PHURINE 5.0 08/07/2019 1526   GLUCOSEU NEGATIVE 08/07/2019 1526   HGBUR NEGATIVE 08/07/2019 [place] 08/07/2019 1526   KETONESUR 5 (A) 08/07/2019 1526   PROTEINUR 30 (A) 08/07/2019 1526   NITRITE NEGATIVE 08/07/2019 [place] 08/07/2019 1526   Sepsis Labs: Invalid input(s): PROCALCITONIN, [place]  Microbiology: Recent Results (from the past 240 hour(s))  Blood Culture (routine x 2)     Status: None (Preliminary result)   Collection Time: 08/07/19  3:26 PM   Specimen: BLOOD  Result Value Ref Range Status   Specimen Description   Final    BLOOD BLOOD RIGHT FOREARM Performed at [facility] [address], [place], [place] [address]    Special Requests   Final    BOTTLES DRAWN AEROBIC AND ANAEROBIC Blood Culture results may not be optimal due to an excessive volume of blood received in culture bottles Performed at [facility] [address], [place], [place] [address]    Culture   Final    NO GROWTH 3 DAYS Performed at [facility], [place] [address], [place], [place] [address]    Report Status PENDING  Incomplete  Urine culture     Status: Abnormal   Collection Time: 08/07/19  3:26 PM   Specimen: In/Out Cath Urine  Result Value Ref Range Status   Specimen Description   Final    IN/OUT CATH URINE Performed at [name] [address], [place], [place] [address]    Special Requests   Final     NONE Performed at [facility], [place] [address] [place], [place] [address]    Culture 50,000 COLONIES/mL YEAST (A)  Final   Report  Status 08/08/2019 FINAL  Final  SARS Coronavirus 2 [facility] order, Performed in [facility] hospital lab) Nasopharyngeal Nasopharyngeal Swab     Status: None   Collection Time: 08/07/19  3:28 PM   Specimen: Nasopharyngeal Swab  Result Value Ref Range Status   SARS Coronavirus 2 NEGATIVE NEGATIVE Final    Comment: (NOTE) If result is NEGATIVE SARS-CoV-2 target nucleic acids are NOT DETECTED. The SARS-CoV-2 RNA is generally detectable in upper and lower  respiratory specimens during the acute phase of infection. The lowest  concentration of SARS-CoV-2 viral copies this assay can detect is 250  copies / mL. A negative result does not preclude SARS-CoV-2 infection  and should not be used as the sole basis for treatment or other  patient management decisions.  A negative result may occur with  improper specimen collection / handling, submission of specimen other  than nasopharyngeal swab, presence of viral mutation(s) within the  areas targeted by this assay, and inadequate number of viral copies  (<250 copies / mL). A negative result must be combined with clinical  observations, patient history, and epidemiological information. If result is POSITIVE SARS-CoV-2 target nucleic acids are DETECTED. The SARS-CoV-2 RNA is generally detectable in upper and lower  respiratory specimens dur ing the acute phase of infection.  Positive  results are indicative of active infection with SARS-CoV-2.  Clinical  correlation with patient history and other diagnostic information is  necessary to determine patient infection status.  Positive results do  not rule out bacterial infection or co-infection with other viruses. If result is PRESUMPTIVE POSTIVE SARS-CoV-2 nucleic acids MAY BE PRESENT.   A presumptive positive result was obtained on the submitted  specimen  and confirmed on repeat testing.  While 2019 novel coronavirus  (SARS-CoV-2) nucleic acids may be present in the submitted sample  additional confirmatory testing may be necessary for epidemiological  and / or clinical management purposes  to differentiate between  SARS-CoV-2 and other Sarbecovirus currently known to infect humans.  If clinically indicated additional testing with an alternate test  methodology [phone]) is advised. The SARS-CoV-2 RNA is generally  detectable in upper and lower respiratory sp ecimens during the acute  phase of infection. The expected result is Negative. Fact Sheet for Patients:  StrictlyIdeas.no Fact Sheet for Healthcare Providers: BankingDealers.co.za This test is not yet approved or cleared by the [place] FDA and has been authorized for detection and/or diagnosis of SARS-CoV-2 by FDA under an Emergency Use Authorization (EUA).  This EUA will remain in effect (meaning this test can be used) for the duration of the COVID-19 declaration under Section 564(b)(1) of the Act, 21 U.S.C. section 360bbb-3(b)(1), unless the authorization is terminated or revoked sooner. Performed at [facility], [place] [address], [place], [place] [address]   Blood Culture (routine x 2)     Status: None (Preliminary result)   Collection Time: 08/07/19  3:31 PM   Specimen: BLOOD  Result Value Ref Range Status   Specimen Description   Final    BLOOD UPPER BLOOD RIGHT FOREARM Performed at [facility] [address], [place], [place] [address]    Special Requests   Final    BOTTLES DRAWN AEROBIC AND ANAEROBIC Blood Culture results may not be optimal due to an excessive volume of blood received in culture bottles Performed at [facility] [address], [place], [place] [address]    Culture  Setup Time   Final    GRAM NEGATIVE  RODS ANAEROBIC BOTTLE  ONLY Organism ID to follow CRITICAL RESULT CALLED TO, READ BACK BY AND VERIFIED WITH: Shelda Jakes PharmD 12:10 08/10/19 ([email or web address])    Culture   Final    CULTURE REINCUBATED FOR BETTER GROWTH Performed at [facility], [place] [address] [place], [place] [address]    Report Status PENDING  Incomplete  Blood Culture ID Panel (Reflexed)     Status: None   Collection Time: 08/07/19  3:31 PM  Result Value Ref Range Status   Enterococcus species NOT DETECTED NOT DETECTED Final   Listeria monocytogenes NOT DETECTED NOT DETECTED Final   Staphylococcus species NOT DETECTED NOT DETECTED Final   Staphylococcus aureus (BCID) NOT DETECTED NOT DETECTED Final   Streptococcus species NOT DETECTED NOT DETECTED Final   Streptococcus agalactiae NOT DETECTED NOT DETECTED Final   Streptococcus pneumoniae NOT DETECTED NOT DETECTED Final   Streptococcus pyogenes NOT DETECTED NOT DETECTED Final   Acinetobacter baumannii NOT DETECTED NOT DETECTED Final   Enterobacteriaceae species NOT DETECTED NOT DETECTED Final   Enterobacter cloacae complex NOT DETECTED NOT DETECTED Final   Escherichia coli NOT DETECTED NOT DETECTED Final   Klebsiella oxytoca NOT DETECTED NOT DETECTED Final   Klebsiella pneumoniae NOT DETECTED NOT DETECTED Final   Proteus species NOT DETECTED NOT DETECTED Final   Serratia marcescens NOT DETECTED NOT DETECTED Final   Haemophilus influenzae NOT DETECTED NOT DETECTED Final   Neisseria meningitidis NOT DETECTED NOT DETECTED Final   Pseudomonas aeruginosa NOT DETECTED NOT DETECTED Final   Candida albicans NOT DETECTED NOT DETECTED Final   Candida glabrata NOT DETECTED NOT DETECTED Final   Candida krusei NOT DETECTED NOT DETECTED Final   Candida parapsilosis NOT DETECTED NOT DETECTED Final   Candida tropicalis NOT DETECTED NOT DETECTED Final    Comment: Performed at [facility], [place] [address] [place], [place] [address]  C difficile quick scan w PCR reflex     Status: None    Collection Time: 08/07/19  6:54 PM   Specimen: STOOL  Result Value Ref Range Status   C Diff antigen NEGATIVE NEGATIVE Final   C Diff toxin NEGATIVE NEGATIVE Final   C Diff interpretation No C. difficile detected.  Final    Comment: Performed at [facility], [place] [address], [place], [place] [address]  Aerobic/Anaerobic Culture (surgical/deep wound)     Status: None (Preliminary result)   Collection Time: 08/09/19  1:39 PM   Specimen: Abscess  Result Value Ref Range Status   Specimen Description   Final    ABSCESS ABDOMEN Performed at [place] [address], [place], [place] [address]    Special Requests   Final    Normal Performed at [facility], [place] [address] [place], [place] [address]    Gram Stain   Final    ABUNDANT WBC PRESENT, PREDOMINANTLY PMN ABUNDANT GRAM NEGATIVE RODS MODERATE GRAM POSITIVE RODS    Culture   Final    CULTURE REINCUBATED FOR BETTER GROWTH Performed at [facility], [place] [address], [place], [place] [address]    Report Status PENDING  Incomplete    Radiology Studies: Ct Image Guided Fluid Drain By Catheter  Result Date: 08/09/2019 INDICATION: 58 year old male with postoperative fluid collection in the right upper quadrant status post Whipple procedure. He presents for CT-guided drain placement. EXAM: CT-guided drain placement MEDICATIONS: The patient is currently admitted to the hospital and receiving intravenous antibiotics. The antibiotics were administered within  an appropriate time frame prior to the initiation of the procedure. ANESTHESIA/SEDATION: Fentanyl 100 mcg IV; Versed 4 mg IV Moderate Sedation Time:  21 minutes The patient was continuously monitored during the procedure by the interventional radiology nurse under my direct supervision. COMPLICATIONS: None immediate. PROCEDURE: Informed written consent was obtained from the patient after a thorough discussion of the  procedural risks, benefits and alternatives. All questions were addressed. Maximal Sterile Barrier Technique was utilized including caps, mask, sterile gowns, sterile gloves, sterile drape, hand hygiene and skin antiseptic. A timeout was performed prior to the initiation of the procedure. A planning axial CT scan was performed. The fluid collection in the right upper quadrant was successfully localized. An appropriate skin entry site was selected and marked. The overlying skin was sterilely prepped and draped in the standard fashion using chlorhexidine skin prep. Local anesthesia was attained by infiltration with 1% lidocaine. A 22 gauge spinal needle was then advanced through the abdominal wall and imaged. Using this trajectory and the tandem trocar technique, a 12 French cook all-purpose drainage catheter was advanced through the abdominal wall and into the fluid collection. The drainage catheter was formed and aspiration was performed. Aspiration yields 150 mL foul-smelling frankly purulent fluid. A sample was sent for Gram stain and culture. The abscess cavity was then lavaged with 20 mL sterile saline and the drain hooked to JP bulb suction. The drain was then secured to the skin with 0 Prolene suture. A sterile bandage was applied. IMPRESSION: Successful placement of a 55 French percutaneous drainage catheter into the intra-abdominal fluid collection. Aspiration yields 200 mL frankly purulent foul-smelling fluid. A sample was sent for Gram stain and culture. PLAN: 1. Recommend repeat CT scan of the abdomen with intravenous contrast when drain output has become minimal for 48 hours. Signed, Criselda Peaches, MD, [facility] Electronically Signed   By: Jacqulynn Cadet M.D.   On: 08/09/2019 15:36    Kaycen [place]worth T. [place]  If 7PM-7AM, please contact night-coverage [email or web address] Password [facility] 08/10/2019, 12:38 PM

## 2019-08-11 ENCOUNTER — Other Ambulatory Visit: Payer: Self-pay

## 2019-08-11 DIAGNOSIS — Z86718 Personal history of other venous thrombosis and embolism: Secondary | ICD-10-CM

## 2019-08-11 DIAGNOSIS — E876 Hypokalemia: Secondary | ICD-10-CM

## 2019-08-11 LAB — BLOOD CULTURE ID PANEL (REFLEXED)

## 2019-08-11 LAB — BASIC METABOLIC PANEL
Anion gap: 11 (ref 5–15)
BUN: <5 mg/dL — ABNORMAL LOW (ref 6–20)
CO2: 23 mmol/L (ref 22–32)
Calcium: 8.2 mg/dL — ABNORMAL LOW (ref 8.9–10.3)
Chloride: 101 mmol/L (ref 98–111)
Creatinine, Ser: 0.94 mg/dL (ref 0.61–1.24)
GFR calc Af Amer: >60 mL/min (ref >60)
GFR calc non Af Amer: >60 mL/min (ref >60)
Glucose, Bld: 111 mg/dL — ABNORMAL HIGH (ref 70–99)
Potassium: 3.8 mmol/L (ref 3.5–5.1)
Sodium: 135 mmol/L (ref 135–145)

## 2019-08-11 LAB — CBC
HCT: 26.7 % — ABNORMAL LOW (ref 39.0–52.0)
Hemoglobin: 8.7 g/dL — ABNORMAL LOW (ref 13.0–17.0)
MCH: 29.5 pg (ref 26.0–34.0)
MCHC: 32.6 g/dL (ref 30.0–36.0)
MCV: 90.5 fL (ref 80.0–100.0)
Platelets: 195 10*3/uL (ref 150–400)
RBC: 2.95 MIL/uL — ABNORMAL LOW (ref 4.22–5.81)
RDW: 14.6 % (ref 11.5–15.5)
WBC: 6.7 10*3/uL (ref 4.0–10.5)
nRBC: 0 % (ref 0.0–0.2)

## 2019-08-11 LAB — GLUCOSE, CAPILLARY
Glucose-Capillary: 113 mg/dL — ABNORMAL HIGH (ref 70–99)
Glucose-Capillary: 114 mg/dL — ABNORMAL HIGH (ref 70–99)
Glucose-Capillary: 170 mg/dL — ABNORMAL HIGH (ref 70–99)

## 2019-08-11 LAB — MAGNESIUM: Magnesium: 1.5 mg/dL — ABNORMAL LOW (ref 1.7–2.4)

## 2019-08-11 MED ORDER — MAGNESIUM SULFATE 2 GM/50ML IV SOLN
2.0000 g | Freq: Once | INTRAVENOUS | Status: AC
  Administered 2019-08-11: 11:00:00 2 g via INTRAVENOUS
  Filled 2019-08-11: qty 50

## 2019-08-11 MED ORDER — AMOXICILLIN-POT CLAVULANATE 875-125 MG PO TABS: 1.0000 | ORAL_TABLET | Freq: Two times a day (BID) | ORAL | 0 refills | Status: DC

## 2019-08-11 MED ORDER — SACCHAROMYCES BOULARDII 250 MG PO CAPS: 250.0000 mg | ORAL_CAPSULE | Freq: Two times a day (BID) | ORAL | 0 refills | Status: DC

## 2019-08-11 MED ORDER — HEPARIN SOD (PORK) LOCK FLUSH 100 UNIT/ML IV SOLN
500.0000 [IU] | INTRAVENOUS | Status: AC | PRN
  Administered 2019-08-11: 500 [IU]

## 2019-08-11 NOTE — Progress Notes (Signed)
Discharge and medication instructions reviewed with patient and spouse. Questions answered and both deny further questions. No prescriptions given. Spouse is driving patient home. Kamalei Roeder, RN 

## 2019-08-11 NOTE — Discharge Summary (Signed)
Physician Discharge Summary  Brian Torres [id number] DOB: September 01, 1961 DOA: 08/07/2019  PCP: Cari Caraway, MD  Admit date: 08/07/2019 Discharge date: 08/11/2019  Admitted From: Home Disposition: Home  Recommendations for Outpatient Follow-up:  1. Follow up with PCP/general surgery/IR as below. 2. Please obtain CBC/BMP/Mag at follow up 3. Please follow up on the following pending results: Blood and fluid cultures.  Home Health: Patient has home health nurse Equipment/Devices: None  Discharge Condition: Stable CODE STATUS: Full code  Hospital Course: 58 y.o.malewith medical history significant forpancreatic cancer s/p Whipple procedure 07/20/2019,IDT2DM, HTN, HLD, Anemia, Hx of LLE DVT on Eliquis whopresents to the ED forevaluation of nausea, vomiting, and near syncopal episode, and admitted for sepsis due to postsurgical intra-abdominal fluid collection concerning for abscesses, and colitis.  Started around vancomycin, cefepime and Flagyl in ED.  Transitioned to Zosyn on admission.  C. difficile negative.  General surgery and IR consulted.  Patient had CT-guided aspiration of intra-abdominal fluid collection and drain placement.  Aspiration yielded 200 cc purulent foul-smelling fluid.  Gram stain on with polymicrobial (GNR on GPR)  Gram stain on initial blood culture with GNR in 1 of the 2 bottles. Reintubated for better gross.  Repeat blood culture pending.  On the day of discharge, patient expressed his wish to return home in his wife's care saying he could do better if he is at home as he was not able to rest or sleep here.  He understand that his blood and fluid cultures have not finalized, and could potentially result in resistant organisms that might not be covered by oral antibiotics.   He has upcoming appointment with general surgery early next week (3 days).  Was given instruction by IR about the management of his abdominal drain.  IR to arrange outpatient  follow-up in 1 week for repeat CT scan.  See individual problem list below for more on hospital course.  Discharge Diagnoses:  Sepsis due to suspected intra-abdominal abscess and colitis -patient was febrile, tachycardic with low blood pressures on admission.  Sepsis physiology resolved.  -CT abdomen and pelvis concerning for RUQ fluid collection/abscess. -C. difficile negative.    Gram stain on initial blood culture with GNR in 1/2 bottles.  Culture intubated.  Repeat blood culture obtained. -CT-guided aspiration and drain placement for intra-abdominal abscess 10/7.  Gram stain with GPR and GNR. -Vancomycin, cefepime and Flagyl 10/5 -IV Zosyn 10/5-10/9.  -Discharged on Augmentin for 10 more days. -Was given an instruction by IR about drain management -Patient to follow-up with general surgery in 3 days and IR in 1 week -Return precautions discussed.  Pancreatic cancer status post Whipple procedure on 07/20/2019: Still with open laparotomy wound.  Follows with oncology, Dr. Learta Codding.  Completed neoadjuvant FOLFIRINOX therapy.  Laparotomy wound appears clean -Outpatient follow-up with general surgery -Wound care at home.  Has home health RN.  IDDM-2 with hyperglycemia.  CBG within fair range. -Discharged on home medications.  History of LLE DVT -Discharged on Eliquis.  Anemia of chronic disease:  denies hematochezia or melena.  Initial drop likely dilutional from IV fluid -Hgb 9.1 (admit)>7.0>1u> 7.7> 8.7 -Recheck CBC at follow-up.  Anion gap metabolic acidosis: Due to malnutrition?  Resolved. -Optimize nutrition.  Appreciate dietitian input.  Essential hypertension: Normotensive. -Discharged on home medications.  Hypokalemia/hyponatremia/hypomagnesemia: Resolved.  Thrombocytopenia: Likely dilutional.  Resolved.  Tobacco use disorder: -Encourage cessation  Funguria: Initial urine culture grew 50,000 colonies of yeast which is likely contamination as patient has no  UTI symptoms and repeat  urine culture is negative.  Discharge Instructions  Discharge Instructions    Call MD for:  difficulty breathing, headache or visual disturbances   Complete by: As directed    Call MD for:  extreme fatigue   Complete by: As directed    Call MD for:  persistant dizziness or light-headedness   Complete by: As directed    Call MD for:  persistant nausea and vomiting   Complete by: As directed    Call MD for:  severe uncontrolled pain   Complete by: As directed    Call MD for:  temperature >100.4   Complete by: As directed    Diet - low sodium heart healthy   Complete by: As directed    Diet Carb Modified   Complete by: As directed    Discharge instructions   Complete by: As directed    It has been a pleasure taking care of you! You were admitted with abdominal pain due to intra-abdominal abscess that was drained.  We treated you with broad-spectrum IV antibiotics. Since you are insisted to go home, we are discharging you on oral antibiotics although there is no guarantee that the oral antibiotic course to cover everything as we do not have the final results on your fluid and blood cultures.  Will recommend flushing the drain daily and measuring her recording the output daily.  Please go to your appointment with your general surgeon.  Interventional radiology will arrange outpatient follow-up in 1 to 2 weeks.  Please come back to the hospital if you have worsening of your symptoms or other symptoms concerning to you.  Please review your new medication list and the directions before you take your medications.  Take care,   Increase activity slowly   Complete by: As directed      Allergies as of 08/11/2019   No Known Allergies     Medication List    STOP taking these medications   magic mouthwash Soln     TAKE these medications   acetaminophen 500 MG tablet Commonly known as: TYLENOL Take 1,000 mg by mouth every 6 (six) hours as needed for moderate  pain or headache.   amoxicillin-clavulanate 875-125 MG tablet Commonly known as: AUGMENTIN Take 1 tablet by mouth every 12 (twelve) hours.   apixaban 5 MG Tabs tablet Commonly known as: ELIQUIS Take 1 tablet (5 mg total) by mouth 2 (two) times daily.   b complex vitamins tablet Take 1 tablet by mouth daily.   Basaglar KwikPen 100 UNIT/ML Sopn Inject 19 Units into the skin at bedtime.   CINNAMON PO Take 1,000 mg by mouth 2 (two) times daily.   COQ-10 PO Take 1 tablet by mouth daily.   feeding supplement (ENSURE ENLIVE) Liqd Take 237 mLs by mouth 3 (three) times daily between meals.   feeding supplement (PRO-STAT SUGAR FREE 64) Liqd Place 30 mLs into feeding tube 3 (three) times daily.   ferrous sulfate 325 (65 FE) MG EC tablet Take 325 mg by mouth 2 (two) times a day.   Fish Oil 1000 MG Caps Take 1,000 mg by mouth 2 (two) times daily.   glimepiride 4 MG tablet Commonly known as: AMARYL Take 4 mg by mouth daily with breakfast.   HumaLOG KwikPen 100 UNIT/ML KwikPen Generic drug: insulin lispro Inject 3-13 Units into the skin 2 (two) times daily as needed (if bs is over 150).   lidocaine-prilocaine cream Commonly known as: EMLA Apply 1 application topically as needed. What changed: reasons  to take this   lisinopril 10 MG tablet Commonly known as: ZESTRIL Take 10 mg by mouth daily.   LORazepam 0.5 MG tablet Commonly known as: ATIVAN Take 1 tablet (0.5 mg total) by mouth every 8 (eight) hours as needed (for nausea).   metFORMIN 1000 MG tablet Commonly known as: GLUCOPHAGE Take 1,000 mg by mouth 2 (two) times daily.   methocarbamol 500 MG tablet Commonly known as: ROBAXIN Take 1 tablet (500 mg total) by mouth every 6 (six) hours as needed for muscle spasms.   ondansetron 4 MG disintegrating tablet Commonly known as: ZOFRAN-ODT Take 1 tablet (4 mg total) by mouth every 6 (six) hours as needed for nausea.   oxyCODONE 5 MG immediate release tablet Commonly  known as: Oxy IR/ROXICODONE Take 1-2 tablets (5-10 mg total) by mouth every 4 (four) hours as needed for moderate pain, severe pain or breakthrough pain.   Pepcid AC 10 MG tablet Generic drug: famotidine Take 10 mg by mouth 2 (two) times daily as needed for heartburn or indigestion.   pioglitazone 45 MG tablet Commonly known as: ACTOS Take 45 mg by mouth daily.   prochlorperazine 10 MG tablet Commonly known as: COMPAZINE Take 1 tablet (10 mg total) by mouth every 6 (six) hours as needed for nausea or vomiting.   saccharomyces boulardii 250 MG capsule Commonly known as: Florastor Take 1 capsule (250 mg total) by mouth 2 (two) times daily.      Follow-up Information    [name], MD Follow up on 08/14/2019.   Specialty: General Surgery Why: 11:30 Contact information: [address] [place] [address] [phone]           Consultations:  General surgery  IR  Procedures/Studies:  2D Echo: None  CT-guided aspiration of intra-abdominal abscess on 08/09/2019  X-ray Abdomen Ap  Result Date: 07/20/2019 CLINICAL DATA:  58 year old male with NG tube placement. EXAM: ABDOMEN - 1 VIEW COMPARISON:  CT of the abdomen dated 05/11/2019. FINDINGS: An enteric tube is noted extending below the diaphragm. The tube makes a single loop in the proximal stomach with tip located in the left hemiabdomen, likely in the distal stomach. There has been interval removal of the biliary stent. Two drainage tube is noted with tips in the epigastric area. No bowel dilatation. Partially visualized central venous line with tip at the cavoatrial junction. Diffuse vascular and interstitial prominence may represent edema although pneumonia is not excluded. Clinical correlation is recommended. IMPRESSION: Enteric tube with tip likely in the distal stomach. Electronically Signed   By: Anner Crete M.D.   On: 07/20/2019 15:00   Ct Angio Chest Pe W And/or Wo Contrast  Result Date:  08/07/2019 CLINICAL DATA:  Syncopal episode. History of pancreatic cancer status post Whipple procedure 3 weeks ago. EXAM: CT ANGIOGRAPHY CHEST CT ABDOMEN AND PELVIS WITH CONTRAST TECHNIQUE: Multidetector CT imaging of the chest was performed using the standard protocol during bolus administration of intravenous contrast. Multiplanar CT image reconstructions and MIPs were obtained to evaluate the vascular anatomy. Multidetector CT imaging of the abdomen and pelvis was performed using the standard protocol during bolus administration of intravenous contrast. CONTRAST:  141mL OMNIPAQUE IOHEXOL 350 MG/ML SOLN COMPARISON:  CT chest, abdomen, and pelvis dated May 11, 2019. FINDINGS: CTA CHEST FINDINGS Cardiovascular: Suboptimal opacification of the pulmonary arteries. No central pulmonary embolism. Normal heart size. No pericardial effusion. Three-vessel coronary atherosclerosis. No thoracic aortic aneurysm or dissection. Unchanged left chest wall port catheter with tip at  the cavoatrial junction. Mediastinum/Nodes: No enlarged mediastinal, hilar, or axillary lymph nodes. Thyroid gland, trachea, and esophagus demonstrate no significant findings. Lungs/Pleura: Dependent subsegmental atelectasis in both lower lobes. No focal consolidation, pleural effusion, or pneumothorax. No suspicious pulmonary nodule. Musculoskeletal: No chest wall abnormality. No acute or significant osseous findings. Review of the MIP images confirms the above findings. CT ABDOMEN AND PELVIS FINDINGS Hepatobiliary: No focal liver abnormality. Mild periportal edema. Interval cholecystectomy with choledochojejunostomy. 7.8 x 6.1 x 8.8 cm rim enhancing fluid collection inferior to the right liver. Additional 2.8 x 1.7 cm rim enhancing fluid collection with small focus of gas in the right upper quadrant just inferior to the falciform ligament. Pancreas: Interval resection of the pancreatic head and proximal body with pancreaticojejunostomy. Spleen:  Normal in size without focal abnormality. Adrenals/Urinary Tract: The adrenal glands are unremarkable. Unchanged 1.9 cm right renal simple cyst. No renal calculi or hydronephrosis. Small foci of dependent air within the bladder likely related to recent instrumentation. No bladder wall thickening. Stomach/Bowel: Prior duodenectomy with gastrojejunostomy. Moderate wall thickening and pericolonic inflammatory changes involving the distal ascending, transverse, and descending colon. Moderate sigmoid diverticulosis. Normal appendix. Jejunostomy feeding tube in place. Vascular/Lymphatic: Aortic atherosclerosis. No enlarged abdominal or pelvic lymph nodes. Reproductive: Prostate is unremarkable. Other: Unchanged small fat containing bilateral inguinal hernias. Trace ascites. No pneumoperitoneum. Musculoskeletal: Open upper abdominal midline wound. No acute or significant osseous findings. Review of the MIP images confirms the above findings. IMPRESSION: Chest: 1. Suboptimal evaluation of the pulmonary arteries. No central pulmonary embolism. If clinical concern remains high, consider V/Q scan for further evaluation. Abdomen and pelvis: 1. Postsurgical changes related to interval Whipple procedure with two rim enhancing fluid collections in the right upper quadrant measuring up to 7.8 x 6.1 x 8.8 cm, concerning for abscesses. 2. Moderate wall thickening and pericolonic inflammatory changes involving the distal ascending, transverse, and descending colon, consistent with colitis. 3.  Aortic atherosclerosis (ICD10-I70.0). Electronically Signed   By: Titus Dubin M.D.   On: 08/07/2019 17:50   Ct Abdomen Pelvis W Contrast  Result Date: 08/07/2019 CLINICAL DATA:  Syncopal episode. History of pancreatic cancer status post Whipple procedure 3 weeks ago. EXAM: CT ANGIOGRAPHY CHEST CT ABDOMEN AND PELVIS WITH CONTRAST TECHNIQUE: Multidetector CT imaging of the chest was performed using the standard protocol during bolus  administration of intravenous contrast. Multiplanar CT image reconstructions and MIPs were obtained to evaluate the vascular anatomy. Multidetector CT imaging of the abdomen and pelvis was performed using the standard protocol during bolus administration of intravenous contrast. CONTRAST:  149mL OMNIPAQUE IOHEXOL 350 MG/ML SOLN COMPARISON:  CT chest, abdomen, and pelvis dated May 11, 2019. FINDINGS: CTA CHEST FINDINGS Cardiovascular: Suboptimal opacification of the pulmonary arteries. No central pulmonary embolism. Normal heart size. No pericardial effusion. Three-vessel coronary atherosclerosis. No thoracic aortic aneurysm or dissection. Unchanged left chest wall port catheter with tip at the cavoatrial junction. Mediastinum/Nodes: No enlarged mediastinal, hilar, or axillary lymph nodes. Thyroid gland, trachea, and esophagus demonstrate no significant findings. Lungs/Pleura: Dependent subsegmental atelectasis in both lower lobes. No focal consolidation, pleural effusion, or pneumothorax. No suspicious pulmonary nodule. Musculoskeletal: No chest wall abnormality. No acute or significant osseous findings. Review of the MIP images confirms the above findings. CT ABDOMEN AND PELVIS FINDINGS Hepatobiliary: No focal liver abnormality. Mild periportal edema. Interval cholecystectomy with choledochojejunostomy. 7.8 x 6.1 x 8.8 cm rim enhancing fluid collection inferior to the right liver. Additional 2.8 x 1.7 cm rim enhancing fluid collection with small focus of gas  in the right upper quadrant just inferior to the falciform ligament. Pancreas: Interval resection of the pancreatic head and proximal body with pancreaticojejunostomy. Spleen: Normal in size without focal abnormality. Adrenals/Urinary Tract: The adrenal glands are unremarkable. Unchanged 1.9 cm right renal simple cyst. No renal calculi or hydronephrosis. Small foci of dependent air within the bladder likely related to recent instrumentation. No bladder wall  thickening. Stomach/Bowel: Prior duodenectomy with gastrojejunostomy. Moderate wall thickening and pericolonic inflammatory changes involving the distal ascending, transverse, and descending colon. Moderate sigmoid diverticulosis. Normal appendix. Jejunostomy feeding tube in place. Vascular/Lymphatic: Aortic atherosclerosis. No enlarged abdominal or pelvic lymph nodes. Reproductive: Prostate is unremarkable. Other: Unchanged small fat containing bilateral inguinal hernias. Trace ascites. No pneumoperitoneum. Musculoskeletal: Open upper abdominal midline wound. No acute or significant osseous findings. Review of the MIP images confirms the above findings. IMPRESSION: Chest: 1. Suboptimal evaluation of the pulmonary arteries. No central pulmonary embolism. If clinical concern remains high, consider V/Q scan for further evaluation. Abdomen and pelvis: 1. Postsurgical changes related to interval Whipple procedure with two rim enhancing fluid collections in the right upper quadrant measuring up to 7.8 x 6.1 x 8.8 cm, concerning for abscesses. 2. Moderate wall thickening and pericolonic inflammatory changes involving the distal ascending, transverse, and descending colon, consistent with colitis. 3.  Aortic atherosclerosis (ICD10-I70.0). Electronically Signed   By: Titus Dubin M.D.   On: 08/07/2019 17:50   Dg Chest Port 1 View  Result Date: 08/07/2019 CLINICAL DATA:  Syncopal episode. EXAM: PORTABLE CHEST 1 VIEW COMPARISON:  Chest CT 08/07/2019 FINDINGS: The cardiac silhouette, mediastinal and hilar contours are within normal limits and stable. The lungs are clear. No pleural effusion. Left subclavian power port in good position, unchanged. The bony thorax is intact. IMPRESSION: No acute cardiopulmonary findings. Electronically Signed   By: Marijo Sanes M.D.   On: 08/07/2019 17:02   Dg Chest Port 1 View  Result Date: 07/24/2019 CLINICAL DATA:  Fever EXAM: PORTABLE CHEST 1 VIEW COMPARISON:  07/22/2019  FINDINGS: The heart size and mediastinal contours are within normal limits. Left chest port catheter. Interval increase in heterogeneous opacity of the left lung base, which is somewhat bandlike and may reflect atelectasis or alternately airspace disease. The visualized skeletal structures are unremarkable. IMPRESSION: Interval increase in heterogeneous opacity of the left lung base, which is somewhat bandlike and may reflect atelectasis or alternately developing airspace disease in the setting of fever. Electronically Signed   By: Eddie Candle M.D.   On: 07/24/2019 09:27   Dg Chest Port 1 View  Result Date: 07/22/2019 CLINICAL DATA:  Respiratory difficulty, fever EXAM: PORTABLE CHEST 1 VIEW COMPARISON:  02/02/2019 FINDINGS: The heart size and mediastinal contours are within normal limits. Left chest port catheter. Both lungs are clear. The visualized skeletal structures are unremarkable. IMPRESSION: No acute abnormality of the lungs in AP portable projection. Electronically Signed   By: Eddie Candle M.D.   On: 07/22/2019 16:30   Ct Image Guided Fluid Drain By Catheter  Result Date: 08/09/2019 INDICATION: 58 year old male with postoperative fluid collection in the right upper quadrant status post Whipple procedure. He presents for CT-guided drain placement. EXAM: CT-guided drain placement MEDICATIONS: The patient is currently admitted to the hospital and receiving intravenous antibiotics. The antibiotics were administered within an appropriate time frame prior to the initiation of the procedure. ANESTHESIA/SEDATION: Fentanyl 100 mcg IV; Versed 4 mg IV Moderate Sedation Time:  21 minutes The patient was continuously monitored during the procedure by the interventional radiology nurse  under my direct supervision. COMPLICATIONS: None immediate. PROCEDURE: Informed written consent was obtained from the patient after a thorough discussion of the procedural risks, benefits and alternatives. All questions were  addressed. Maximal Sterile Barrier Technique was utilized including caps, mask, sterile gowns, sterile gloves, sterile drape, hand hygiene and skin antiseptic. A timeout was performed prior to the initiation of the procedure. A planning axial CT scan was performed. The fluid collection in the right upper quadrant was successfully localized. An appropriate skin entry site was selected and marked. The overlying skin was sterilely prepped and draped in the standard fashion using chlorhexidine skin prep. Local anesthesia was attained by infiltration with 1% lidocaine. A 22 gauge spinal needle was then advanced through the abdominal wall and imaged. Using this trajectory and the tandem trocar technique, a 12 French cook all-purpose drainage catheter was advanced through the abdominal wall and into the fluid collection. The drainage catheter was formed and aspiration was performed. Aspiration yields 150 mL foul-smelling frankly purulent fluid. A sample was sent for Gram stain and culture. The abscess cavity was then lavaged with 20 mL sterile saline and the drain hooked to JP bulb suction. The drain was then secured to the skin with 0 Prolene suture. A sterile bandage was applied. IMPRESSION: Successful placement of a 15 French percutaneous drainage catheter into the intra-abdominal fluid collection. Aspiration yields 200 mL frankly purulent foul-smelling fluid. A sample was sent for Gram stain and culture. PLAN: 1. Recommend repeat CT scan of the abdomen with intravenous contrast when drain output has become minimal for 48 hours. Signed, Criselda Peaches, MD, [facility] Electronically Signed   By: Jacqulynn Cadet M.D.   On: 08/09/2019 15:36     Subjective: No medicines overnight of this morning.  States having a rough night due to difficulty sleeping and resting here.  He wishes to go home.  He says he would do better if he is at home.  He  understands that his cultures have not finalized to guide [place] about appropriate oral antibiotics but he insists to go home.  Discharge Exam: Vitals:   08/10/19 2245 08/11/19 0528  BP: (!) 142/78 137/84  Pulse: 71 70  Resp: 14 15  Temp: 99.3 F (37.4 C) 98.9 F (37.2 C)  SpO2: 100% 98%    GENERAL: No acute distress.  Resting comfortably. HEENT: MMM.  Vision and hearing grossly intact.  NECK: Supple.  No apparent JVD.  RESP:  No IWOB. Good air movement bilaterally. CVS:  RRR. Heart sounds normal.  ABD/GI/GU: Bowel sounds present. Soft. Non tender.  Dressing over laparotomy wound.  J-tube and drain in place.  Some purulent material in JP drain with blood-tinged. MSK/EXT:  Moves extremities. No apparent deformity or edema.  SKIN: no apparent skin lesion or wound NEURO: Awake, alert and oriented appropriately.  No gross deficit.  PSYCH: Calm. Normal affect    The results of significant diagnostics from this hospitalization (including imaging, microbiology, ancillary and laboratory) are listed below for reference.     Microbiology: Recent Results (from the past 240 hour(s))  Blood Culture (routine x 2)     Status: None (Preliminary result)   Collection Time: 08/07/19  3:26 PM   Specimen: BLOOD  Result Value Ref Range Status   Specimen Description   Final    BLOOD BLOOD RIGHT FOREARM Performed at [facility] [address] [place], [place] [address]    Special Requests   Final  BOTTLES DRAWN AEROBIC AND ANAEROBIC Blood Culture results may not be optimal due to an excessive volume of blood received in culture bottles Performed at [facility], [place] [address], [place], [place] [address]    Culture  Setup Time   Final    GRAM NEGATIVE RODS ANAEROBIC BLOOD AGAR PLATE CRITICAL RESULT CALLED TO, READ BACK BY AND VERIFIED WITH: PHARMD T GREEN [id number] AT 840 AM BY CM    Culture   Final    CULTURE REINCUBATED FOR BETTER GROWTH Performed at  [facility], [place] [address], [place], [place] [address]    Report Status PENDING  Incomplete  Urine culture     Status: Abnormal   Collection Time: 08/07/19  3:26 PM   Specimen: In/Out Cath Urine  Result Value Ref Range Status   Specimen Description   Final    IN/OUT CATH URINE Performed at [facility] [address], [place], [place] [address]    Special Requests   Final    NONE Performed at [facility], [place] [address], [place], [place] [address]    Culture 50,000 COLONIES/mL YEAST (A)  Final   Report Status 08/08/2019 FINAL  Final  SARS Coronavirus 2 [facility] order, Performed in [facility] hospital lab) Nasopharyngeal Nasopharyngeal Swab     Status: None   Collection Time: 08/07/19  3:28 PM   Specimen: Nasopharyngeal Swab  Result Value Ref Range Status   SARS Coronavirus 2 NEGATIVE NEGATIVE Final    Comment: (NOTE) If result is NEGATIVE SARS-CoV-2 target nucleic acids are NOT DETECTED. The SARS-CoV-2 RNA is generally detectable in upper and lower  respiratory specimens during the acute phase of infection. The lowest  concentration of SARS-CoV-2 viral copies this assay can detect is 250  copies / mL. A negative result does not preclude SARS-CoV-2 infection  and should not be used as the sole basis for treatment or other  patient management decisions.  A negative result may occur with  improper specimen collection / handling, submission of specimen other  than nasopharyngeal swab, presence of viral mutation(s) within the  areas targeted by this assay, and inadequate number of viral copies  (<250 copies / mL). A negative result must be combined with clinical  observations, patient history, and epidemiological information. If result is POSITIVE SARS-CoV-2 target nucleic acids are DETECTED. The SARS-CoV-2 RNA is generally detectable in upper and lower  respiratory specimens dur ing the acute phase of infection.  Positive  results  are indicative of active infection with SARS-CoV-2.  Clinical  correlation with patient history and other diagnostic information is  necessary to determine patient infection status.  Positive results do  not rule out bacterial infection or co-infection with other viruses. If result is PRESUMPTIVE POSTIVE SARS-CoV-2 nucleic acids MAY BE PRESENT.   A presumptive positive result was obtained on the submitted specimen  and confirmed on repeat testing.  While 2019 novel coronavirus  (SARS-CoV-2) nucleic acids may be present in the submitted sample  additional confirmatory testing may be necessary for epidemiological  and / or clinical management purposes  to differentiate between  SARS-CoV-2 and other Sarbecovirus currently known to infect humans.  If clinically indicated additional testing with an alternate test  methodology [phone]) is advised. The SARS-CoV-2 RNA is generally  detectable in upper and lower respiratory sp ecimens during the acute  phase of infection. The expected result is Negative. Fact Sheet for Patients:  StrictlyIdeas.no Fact Sheet for Healthcare Providers: BankingDealers.co.za  This test is not yet approved or cleared by the [place] and has been authorized for detection and/or diagnosis of SARS-CoV-2 by FDA under an Emergency Use Authorization (EUA).  This EUA will remain in effect (meaning this test can be used) for the duration of the COVID-19 declaration under Section 564(b)(1) of the Act, 21 U.S.C. section 360bbb-3(b)(1), unless the authorization is terminated or revoked sooner. Performed at [facility], [place] [address] [place], [place] [address]   Blood Culture (routine x 2)     Status: None (Preliminary result)   Collection Time: 08/07/19  3:31 PM   Specimen: BLOOD  Result Value Ref Range Status   Specimen Description   Final    BLOOD UPPER BLOOD RIGHT FOREARM Performed at [facility] [address], [place], [place] [address]    Special Requests   Final    BOTTLES DRAWN AEROBIC AND ANAEROBIC Blood Culture results may not be optimal due to an excessive volume of blood received in culture bottles Performed at [facility] [address], [place], [place] [address]    Culture  Setup Time   Final    GRAM NEGATIVE RODS ANAEROBIC BOTTLE ONLY CRITICAL RESULT CALLED TO, READ BACK BY AND VERIFIED WITH: Shelda Jakes PharmD 12:10 08/10/19 ([email or web address])    Culture   Final    CULTURE REINCUBATED FOR BETTER GROWTH Performed at [facility], [place] [address], [place], [place] [address]    Report Status PENDING  Incomplete  Blood Culture ID Panel (Reflexed)     Status: None   Collection Time: 08/07/19  3:31 PM  Result Value Ref Range Status   Enterococcus species NOT DETECTED NOT DETECTED Final   Listeria monocytogenes NOT DETECTED NOT DETECTED Final   Staphylococcus species NOT DETECTED NOT DETECTED Final   Staphylococcus aureus (BCID) NOT DETECTED NOT DETECTED Final   Streptococcus species NOT DETECTED NOT DETECTED Final   Streptococcus agalactiae NOT DETECTED NOT DETECTED Final   Streptococcus pneumoniae NOT DETECTED NOT DETECTED Final   Streptococcus pyogenes NOT DETECTED NOT DETECTED Final   Acinetobacter baumannii NOT DETECTED NOT DETECTED Final   Enterobacteriaceae species NOT DETECTED NOT DETECTED Final   Enterobacter cloacae complex NOT DETECTED NOT DETECTED Final   Escherichia coli NOT DETECTED NOT DETECTED Final   Klebsiella oxytoca NOT DETECTED NOT DETECTED Final   Klebsiella pneumoniae NOT DETECTED NOT DETECTED Final   Proteus species NOT DETECTED NOT DETECTED Final   Serratia marcescens NOT DETECTED NOT DETECTED Final   Haemophilus influenzae NOT DETECTED NOT DETECTED Final   Neisseria meningitidis NOT DETECTED NOT DETECTED Final   Pseudomonas aeruginosa NOT DETECTED NOT DETECTED Final   Candida albicans NOT DETECTED  NOT DETECTED Final   Candida glabrata NOT DETECTED NOT DETECTED Final   Candida krusei NOT DETECTED NOT DETECTED Final   Candida parapsilosis NOT DETECTED NOT DETECTED Final   Candida tropicalis NOT DETECTED NOT DETECTED Final    Comment: Performed at [facility], [place] [address] [place], [place] [address]  C difficile quick scan w PCR reflex     Status: None   Collection Time: 08/07/19  6:54 PM   Specimen: STOOL  Result Value Ref Range Status   C Diff antigen NEGATIVE NEGATIVE Final   C Diff toxin NEGATIVE NEGATIVE Final   C Diff interpretation No C. difficile detected.  Final    Comment: Performed at [facility], [place] [address] [place], [place] [address]  Aerobic/Anaerobic Culture (surgical/deep wound)     Status: None (Preliminary result)   Collection Time: 08/09/19  1:39 PM   Specimen: Abscess  Result Value Ref Range Status   Specimen Description   Final    ABSCESS ABDOMEN Performed at [place] [address], [place], [place] [address]    Special Requests   Final    Normal Performed at [facility], [place] [address] [place], [place] [address]    Gram Stain   Final    ABUNDANT WBC PRESENT, PREDOMINANTLY PMN ABUNDANT GRAM NEGATIVE RODS MODERATE GRAM POSITIVE RODS    Culture   Final    CULTURE REINCUBATED FOR BETTER GROWTH Performed at [facility], [place] [address], [place], [place] [address]    Report Status PENDING  Incomplete  Culture, Urine     Status: None   Collection Time: 08/09/19  4:40 PM   Specimen: Urine, Clean Catch  Result Value Ref Range Status   Specimen Description   Final    URINE, CLEAN CATCH Performed at [facility], [place] [address] [place], [place] [address]    Special Requests   Final    NONE Performed at [facility], [place] [address], [place], [place] [address]    Culture   Final    NO GROWTH Performed at [facility],  [place] [address] [place], [place] [address]    Report Status 08/10/2019 FINAL  Final  Culture, blood (routine x 2)     Status: None (Preliminary result)   Collection Time: 08/10/19  1:32 PM   Specimen: BLOOD  Result Value Ref Range Status   Specimen Description   Final    BLOOD RIGHT ANTECUBITAL Performed at [place] [address] [place], [place] [address]    Special Requests   Final    BOTTLES DRAWN AEROBIC AND ANAEROBIC Blood Culture adequate volume Performed at [facility] [facility] [address], [place], [place] [address]    Culture   Final    NO GROWTH < 24 HOURS Performed at [facility] [address], [place], [place] [address]    Report Status PENDING  Incomplete  Culture, blood (routine x 2)     Status: None (Preliminary result)   Collection Time: 08/10/19  1:38 PM   Specimen: BLOOD RIGHT HAND  Result Value Ref Range Status   Specimen Description   Final    BLOOD RIGHT HAND Performed at [place] [address], [place], [place] [address]    Special Requests   Final    BOTTLES DRAWN AEROBIC AND ANAEROBIC Blood Culture adequate volume Performed at [place] [address], [place],  [address]    Culture   Final    NO GROWTH < 24 HOURS Performed at [place] [address] [place],  [address]    Report Status PENDING  Incomplete     Labs: BNP (last 3 results) No results for input(s): BNP in the last 8760 hours. Basic Metabolic Panel: Recent Labs  Lab 08/07/19 1526 08/07/19 1607 08/08/19 0455 08/09/19 0923 08/10/19 0500 08/11/19 0553  NA 131*  --  135 133* 135 135  K 3.3*  --  4.2 3.6 3.6 3.8  CL 100  --  106 101 104 101  CO2 21*  --  21* 16* 24 23  GLUCOSE 175*  --  163* 119* 106* 111*  BUN 23*  --  24* 10 6 <5*  CREATININE 1.24  --  1.25* 0.88 0.87 0.94  CALCIUM 7.9*  --  7.2* 7.9* 7.9* 8.2*  MG  --  1.2* 1.9 1.6* 1.7 1.5*  PHOS  --   --    --  2.6  --   --    Liver Function Tests: Recent Labs  Lab 08/07/19 1526 08/09/19 0923  AST 27 18  ALT 23 19  ALKPHOS 262* 210*  BILITOT 0.7 0.3  PROT 6.3* 6.0*  ALBUMIN 2.6* 2.2*   Recent Labs  Lab 08/07/19 1526  LIPASE 15   No results for input(s): AMMONIA in the last 168 hours. CBC: Recent Labs  Lab 08/07/19 1526 08/08/19 0455 08/08/19 1200 08/08/19 2045 08/09/19 0923 08/10/19 0500 08/11/19 0553  WBC 6.7 10.8*  --   --  8.6  --  6.7  NEUTROABS 6.5  --   --   --   --   --   --   HGB 9.1* 7.0* 7.0* 7.7* 7.9* 7.7* 8.7*  HCT 27.9* 22.1* 21.7* 24.1* 24.3* 24.0* 26.7*  MCV 92.4 94.0  --   --  91.0  --  90.5  PLT 193 139*  --   --  161  --  195   Cardiac Enzymes: No results for input(s): CKTOTAL, CKMB, CKMBINDEX, TROPONINI in the last 168 hours. BNP: Invalid input(s): POCBNP CBG: Recent Labs  Lab 08/10/19 1608 08/10/19 1954 08/11/19 0004 08/11/19 0454 08/11/19 0742  GLUCAP 241* 233* 170* 113* 114*   D-Dimer No results for input(s): DDIMER in the last 72 hours. Hgb A1c No results for input(s): HGBA1C in the last 72 hours. Lipid Profile No results for input(s): CHOL, HDL, LDLCALC, TRIG, CHOLHDL, LDLDIRECT in the last 72 hours. Thyroid function studies No results for input(s): TSH, T4TOTAL, T3FREE, THYROIDAB in the last 72 hours.  Invalid input(s): FREET3 Anemia work up No results for input(s): VITAMINB12, FOLATE, FERRITIN, TIBC, IRON, RETICCTPCT in the last 72 hours. Urinalysis    Component Value Date/Time   COLORURINE AMBER (A) 08/07/2019 1526   APPEARANCEUR HAZY (A) 08/07/2019 1526   LABSPEC >1.046 (H) 08/07/2019 1526   PHURINE 5.0 08/07/2019 1526   GLUCOSEU NEGATIVE 08/07/2019 1526   HGBUR NEGATIVE 08/07/2019 1526   BILIRUBINUR NEGATIVE 08/07/2019 1526   KETONESUR 5 (A) 08/07/2019 1526   PROTEINUR 30 (A) 08/07/2019 1526   NITRITE NEGATIVE 08/07/2019 1526   LEUKOCYTESUR NEGATIVE 08/07/2019 1526   Sepsis Labs Invalid input(s): PROCALCITONIN,   WBC,  LACTICIDVEN   Time coordinating discharge: 35 minutes  SIGNED:  Mercy Riding, MD  [facility] 08/11/2019, 10:30 AM  If 7PM-7AM, please contact night-coverage [email or web address] Password [email or web address]

## 2019-08-11 NOTE — Progress Notes (Signed)
Patient ID: Brian Torres, male   DOB: [birth year], 58 y.o.   MRN: [id number]    Referring Physician(s): Dr. Barry Dienes  Supervising Physician: Jacqulynn Cadet  Patient Status:  [facility] - In-pt  Chief Complaint:  Intraabdominal abscess  Subjective:  Patient is eager to go home. He denies any new symptoms, but does report mild tenderness in RUQ continued.  Allergies: Patient has no known allergies.  Medications: Prior to Admission medications   Medication Sig Start Date End Date Taking? Authorizing Provider  acetaminophen (TYLENOL) 500 MG tablet Take 1,000 mg by mouth every 6 (six) hours as needed for moderate pain or headache.   Yes [provider]  Amino Acids-Protein Hydrolys (FEEDING SUPPLEMENT, PRO-STAT SUGAR FREE 64,) LIQD Place 30 mLs into feeding tube 3 (three) times daily. 07/28/19  Yes [name], MD  apixaban (ELIQUIS) 5 MG TABS tablet Take 1 tablet (5 mg total) by mouth 2 (two) times daily. 01/04/19  Yes Arta Silence, MD  b complex vitamins tablet Take 1 tablet by mouth daily.   Yes [provider]  CINNAMON PO Take 1,000 mg by mouth 2 (two) times daily.   Yes [provider]  Coenzyme Q10 (COQ-10 PO) Take 1 tablet by mouth daily.   Yes [provider]  famotidine (PEPCID AC) 10 MG tablet Take 10 mg by mouth 2 (two) times daily as needed for heartburn or indigestion.   Yes [provider]  feeding supplement, ENSURE ENLIVE, (ENSURE ENLIVE) LIQD Take 237 mLs by mouth 3 (three) times daily between meals. 07/28/19  Yes [name], MD  ferrous sulfate 325 (65 FE) MG EC tablet Take 325 mg by mouth 2 (two) times a day.    Yes [provider]  glimepiride (AMARYL) 4 MG tablet Take 4 mg by mouth daily with breakfast.  10/13/17  Yes [provider]  HUMALOG KWIKPEN 100 UNIT/ML KwikPen Inject 3-13 Units into the skin 2 (two) times daily as needed (if bs is over 150).  06/29/19  Yes [provider]  Insulin  Glargine (BASAGLAR KWIKPEN) 100 UNIT/ML SOPN Inject 19 Units into the skin at bedtime.  10/19/18  Yes [provider]  lisinopril (PRINIVIL,ZESTRIL) 10 MG tablet Take 10 mg by mouth daily. 10/13/17  Yes [provider]  LORazepam (ATIVAN) 0.5 MG tablet Take 1 tablet (0.5 mg total) by mouth every 8 (eight) hours as needed (for nausea). 05/01/19  Yes Ladell Pier, MD  metFORMIN (GLUCOPHAGE) 1000 MG tablet Take 1,000 mg by mouth 2 (two) times daily. 10/13/17  Yes [provider]  methocarbamol (ROBAXIN) 500 MG tablet Take 1 tablet (500 mg total) by mouth every 6 (six) hours as needed for muscle spasms. 07/28/19  Yes [name], MD  Omega-3 Fatty Acids (FISH OIL) 1000 MG CAPS Take 1,000 mg by mouth 2 (two) times daily.    Yes [provider]  ondansetron (ZOFRAN-ODT) 4 MG disintegrating tablet Take 1 tablet (4 mg total) by mouth every 6 (six) hours as needed for nausea. 07/28/19  Yes [name], MD  oxyCODONE (OXY IR/ROXICODONE) 5 MG immediate release tablet Take 1-2 tablets (5-10 mg total) by mouth every 4 (four) hours as needed for moderate pain, severe pain or breakthrough pain. 07/28/19  Yes [name], MD  pioglitazone (ACTOS) 45 MG tablet Take 45 mg by mouth daily. 10/13/17  Yes [provider]  prochlorperazine (COMPAZINE) 10 MG tablet Take 1 tablet (10 mg total) by mouth every 6 (six) hours as needed for  nausea or vomiting. 04/14/19  Yes Ladell Pier, MD  amoxicillin-clavulanate (AUGMENTIN) 875-125 MG tablet Take 1 tablet by mouth every 12 (twelve) hours. 08/11/19   Mercy Riding, MD  lidocaine-prilocaine (EMLA) cream Apply 1 application topically as needed. Patient taking differently: Apply 1 application topically as needed (port access).  01/26/19   Ladell Pier, MD  saccharomyces boulardii (FLORASTOR) 250 MG capsule Take 1 capsule (250 mg total) by mouth 2 (two) times daily. 08/11/19   Mercy Riding, MD     Vital Signs: BP 137/84 (BP  Location: Right Arm)    Pulse 70    Temp 98.9 F (37.2 C) (Oral)    Resp 15    Ht 6\' 4"  (1.93 m)    Wt 199 lb 11.2 oz (90.6 kg)    SpO2 98%    BMI 24.31 kg/m   Physical Exam  Awake and alert. Drain 25cc serosanguinous foul smelling fluid flushed with 5cc NS. Incision clean, dry, drain intact.  Imaging: Ct Angio Chest Pe W And/or Wo Contrast  Result Date: 08/07/2019 CLINICAL DATA:  Syncopal episode. History of pancreatic cancer status post Whipple procedure 3 weeks ago. EXAM: CT ANGIOGRAPHY CHEST CT ABDOMEN AND PELVIS WITH CONTRAST TECHNIQUE: Multidetector CT imaging of the chest was performed using the standard protocol during bolus administration of intravenous contrast. Multiplanar CT image reconstructions and MIPs were obtained to evaluate the vascular anatomy. Multidetector CT imaging of the abdomen and pelvis was performed using the standard protocol during bolus administration of intravenous contrast. CONTRAST:  178mL OMNIPAQUE IOHEXOL 350 MG/ML SOLN COMPARISON:  CT chest, abdomen, and pelvis dated May 11, 2019. FINDINGS: CTA CHEST FINDINGS Cardiovascular: Suboptimal opacification of the pulmonary arteries. No central pulmonary embolism. Normal heart size. No pericardial effusion. Three-vessel coronary atherosclerosis. No thoracic aortic aneurysm or dissection. Unchanged left chest wall port catheter with tip at the cavoatrial junction. Mediastinum/Nodes: No enlarged mediastinal, hilar, or axillary lymph nodes. Thyroid gland, trachea, and esophagus demonstrate no significant findings. Lungs/Pleura: Dependent subsegmental atelectasis in both lower lobes. No focal consolidation, pleural effusion, or pneumothorax. No suspicious pulmonary nodule. Musculoskeletal: No chest wall abnormality. No acute or significant osseous findings. Review of the MIP images confirms the above findings. CT ABDOMEN AND PELVIS FINDINGS Hepatobiliary: No focal liver abnormality. Mild periportal edema. Interval cholecystectomy  with choledochojejunostomy. 7.8 x 6.1 x 8.8 cm rim enhancing fluid collection inferior to the right liver. Additional 2.8 x 1.7 cm rim enhancing fluid collection with small focus of gas in the right upper quadrant just inferior to the falciform ligament. Pancreas: Interval resection of the pancreatic head and proximal body with pancreaticojejunostomy. Spleen: Normal in size without focal abnormality. Adrenals/Urinary Tract: The adrenal glands are unremarkable. Unchanged 1.9 cm right renal simple cyst. No renal calculi or hydronephrosis. Small foci of dependent air within the bladder likely related to recent instrumentation. No bladder wall thickening. Stomach/Bowel: Prior duodenectomy with gastrojejunostomy. Moderate wall thickening and pericolonic inflammatory changes involving the distal ascending, transverse, and descending colon. Moderate sigmoid diverticulosis. Normal appendix. Jejunostomy feeding tube in place. Vascular/Lymphatic: Aortic atherosclerosis. No enlarged abdominal or pelvic lymph nodes. Reproductive: Prostate is unremarkable. Other: Unchanged small fat containing bilateral inguinal hernias. Trace ascites. No pneumoperitoneum. Musculoskeletal: Open upper abdominal midline wound. No acute or significant osseous findings. Review of the MIP images confirms the above findings. IMPRESSION: Chest: 1. Suboptimal evaluation of the pulmonary arteries. No central pulmonary embolism. If clinical concern remains high, consider V/Q scan for further evaluation. Abdomen and pelvis: 1. Postsurgical  changes related to interval Whipple procedure with two rim enhancing fluid collections in the right upper quadrant measuring up to 7.8 x 6.1 x 8.8 cm, concerning for abscesses. 2. Moderate wall thickening and pericolonic inflammatory changes involving the distal ascending, transverse, and descending colon, consistent with colitis. 3.  Aortic atherosclerosis (ICD10-I70.0). Electronically Signed   By: Titus Dubin M.D.    On: 08/07/2019 17:50   Ct Abdomen Pelvis W Contrast  Result Date: 08/07/2019 CLINICAL DATA:  Syncopal episode. History of pancreatic cancer status post Whipple procedure 3 weeks ago. EXAM: CT ANGIOGRAPHY CHEST CT ABDOMEN AND PELVIS WITH CONTRAST TECHNIQUE: Multidetector CT imaging of the chest was performed using the standard protocol during bolus administration of intravenous contrast. Multiplanar CT image reconstructions and MIPs were obtained to evaluate the vascular anatomy. Multidetector CT imaging of the abdomen and pelvis was performed using the standard protocol during bolus administration of intravenous contrast. CONTRAST:  173mL OMNIPAQUE IOHEXOL 350 MG/ML SOLN COMPARISON:  CT chest, abdomen, and pelvis dated May 11, 2019. FINDINGS: CTA CHEST FINDINGS Cardiovascular: Suboptimal opacification of the pulmonary arteries. No central pulmonary embolism. Normal heart size. No pericardial effusion. Three-vessel coronary atherosclerosis. No thoracic aortic aneurysm or dissection. Unchanged left chest wall port catheter with tip at the cavoatrial junction. Mediastinum/Nodes: No enlarged mediastinal, hilar, or axillary lymph nodes. Thyroid gland, trachea, and esophagus demonstrate no significant findings. Lungs/Pleura: Dependent subsegmental atelectasis in both lower lobes. No focal consolidation, pleural effusion, or pneumothorax. No suspicious pulmonary nodule. Musculoskeletal: No chest wall abnormality. No acute or significant osseous findings. Review of the MIP images confirms the above findings. CT ABDOMEN AND PELVIS FINDINGS Hepatobiliary: No focal liver abnormality. Mild periportal edema. Interval cholecystectomy with choledochojejunostomy. 7.8 x 6.1 x 8.8 cm rim enhancing fluid collection inferior to the right liver. Additional 2.8 x 1.7 cm rim enhancing fluid collection with small focus of gas in the right upper quadrant just inferior to the falciform ligament. Pancreas: Interval resection of the  pancreatic head and proximal body with pancreaticojejunostomy. Spleen: Normal in size without focal abnormality. Adrenals/Urinary Tract: The adrenal glands are unremarkable. Unchanged 1.9 cm right renal simple cyst. No renal calculi or hydronephrosis. Small foci of dependent air within the bladder likely related to recent instrumentation. No bladder wall thickening. Stomach/Bowel: Prior duodenectomy with gastrojejunostomy. Moderate wall thickening and pericolonic inflammatory changes involving the distal ascending, transverse, and descending colon. Moderate sigmoid diverticulosis. Normal appendix. Jejunostomy feeding tube in place. Vascular/Lymphatic: Aortic atherosclerosis. No enlarged abdominal or pelvic lymph nodes. Reproductive: Prostate is unremarkable. Other: Unchanged small fat containing bilateral inguinal hernias. Trace ascites. No pneumoperitoneum. Musculoskeletal: Open upper abdominal midline wound. No acute or significant osseous findings. Review of the MIP images confirms the above findings. IMPRESSION: Chest: 1. Suboptimal evaluation of the pulmonary arteries. No central pulmonary embolism. If clinical concern remains high, consider V/Q scan for further evaluation. Abdomen and pelvis: 1. Postsurgical changes related to interval Whipple procedure with two rim enhancing fluid collections in the right upper quadrant measuring up to 7.8 x 6.1 x 8.8 cm, concerning for abscesses. 2. Moderate wall thickening and pericolonic inflammatory changes involving the distal ascending, transverse, and descending colon, consistent with colitis. 3.  Aortic atherosclerosis (ICD10-I70.0). Electronically Signed   By: Titus Dubin M.D.   On: 08/07/2019 17:50   Dg Chest Port 1 View  Result Date: 08/07/2019 CLINICAL DATA:  Syncopal episode. EXAM: PORTABLE CHEST 1 VIEW COMPARISON:  Chest CT 08/07/2019 FINDINGS: The cardiac silhouette, mediastinal and hilar contours are within normal limits and  stable. The lungs are  clear. No pleural effusion. Left subclavian power port in good position, unchanged. The bony thorax is intact. IMPRESSION: No acute cardiopulmonary findings. Electronically Signed   By: Marijo Sanes M.D.   On: 08/07/2019 17:02   Ct Image Guided Fluid Drain By Catheter  Result Date: 08/09/2019 INDICATION: 58 year old male with postoperative fluid collection in the right upper quadrant status post Whipple procedure. He presents for CT-guided drain placement. EXAM: CT-guided drain placement MEDICATIONS: The patient is currently admitted to the hospital and receiving intravenous antibiotics. The antibiotics were administered within an appropriate time frame prior to the initiation of the procedure. ANESTHESIA/SEDATION: Fentanyl 100 mcg IV; Versed 4 mg IV Moderate Sedation Time:  21 minutes The patient was continuously monitored during the procedure by the interventional radiology nurse under my direct supervision. COMPLICATIONS: None immediate. PROCEDURE: Informed written consent was obtained from the patient after a thorough discussion of the procedural risks, benefits and alternatives. All questions were addressed. Maximal Sterile Barrier Technique was utilized including caps, mask, sterile gowns, sterile gloves, sterile drape, hand hygiene and skin antiseptic. A timeout was performed prior to the initiation of the procedure. A planning axial CT scan was performed. The fluid collection in the right upper quadrant was successfully localized. An appropriate skin entry site was selected and marked. The overlying skin was sterilely prepped and draped in the standard fashion using chlorhexidine skin prep. Local anesthesia was attained by infiltration with 1% lidocaine. A 22 gauge spinal needle was then advanced through the abdominal wall and imaged. Using this trajectory and the tandem trocar technique, a 12 French cook all-purpose drainage catheter was advanced through the abdominal wall and into the fluid  collection. The drainage catheter was formed and aspiration was performed. Aspiration yields 150 mL foul-smelling frankly purulent fluid. A sample was sent for Gram stain and culture. The abscess cavity was then lavaged with 20 mL sterile saline and the drain hooked to JP bulb suction. The drain was then secured to the skin with 0 Prolene suture. A sterile bandage was applied. IMPRESSION: Successful placement of a 83 French percutaneous drainage catheter into the intra-abdominal fluid collection. Aspiration yields 200 mL frankly purulent foul-smelling fluid. A sample was sent for Gram stain and culture. PLAN: 1. Recommend repeat CT scan of the abdomen with intravenous contrast when drain output has become minimal for 48 hours. Signed, Criselda Peaches, MD, [facility] Electronically Signed   By: Jacqulynn Cadet M.D.   On: 08/09/2019 15:36    Labs:  CBC: Recent Labs    08/07/19 1526 08/08/19 0455  08/08/19 2045 08/09/19 0923 08/10/19 0500 08/11/19 0553  WBC 6.7 10.8*  --   --  8.6  --  6.7  HGB 9.1* 7.0*   < > 7.7* 7.9* 7.7* 8.7*  HCT 27.9* 22.1*   < > 24.1* 24.3* 24.0* 26.7*  PLT 193 139*  --   --  161  --  195   < > = values in this interval not displayed.    COAGS: Recent Labs    12/19/18 2153 12/20/18 0626 12/20/18 1935 12/22/18 0159 12/23/18 0014 07/12/19 0959 07/20/19 1610  INR 1.47 1.22  --   --   --  1.0 1.1  APTT  --  33 92* 80* 65*  --   --     BMP: Recent Labs    08/08/19 0455 08/09/19 0923 08/10/19 0500 08/11/19 0553  NA 135 133* 135 135  K 4.2 3.6 3.6 3.8  CL 106 101 104 101  CO2 21* 16* 24 23  GLUCOSE 163* 119* 106* 111*  BUN 24* 10 6 <5*  CALCIUM 7.2* 7.9* 7.9* 8.2*  CREATININE 1.25* 0.88 0.87 0.94  GFRNONAA >60 >60 >60 >60  GFRAA >60 >60 >60 >60    LIVER FUNCTION TESTS: Recent Labs    07/25/19 0500 07/27/19 0127 08/07/19 1526 08/09/19 0923  BILITOT 0.8 0.7 0.7 0.3  AST 24  86* 27 18  ALT 24 74* 23 19  ALKPHOS 190* 380* 262* 210*  PROT 5.2* 5.3* 6.3* 6.0*  ALBUMIN 2.4* 2.2* 2.6* 2.2*    Assessment and Plan:  Pancreatic cancer post Whipple procedure 0000000, complication likely due to intra-abdominal abscess. CT guided aspiration and drain placed 10/7. Cultures GPR/GNR. Repeat blood cultures pending, but emperic antibiotics given. Patient discharged with directions to flush with 5cc NS daily, record output, and rebandage. Patient and wife both verbally understood. IR clinic f/u ordered  Electronically Signed: D. Rowe Robert, PA-C/Chelsea Cyr PA-S 08/11/2019, 12:59 PM   I spent a total of 15 Minutes at the the patient's bedside AND on the patient's hospital floor or unit, greater than 50% of which was counseling/coordinating care for intrabdominal fluid collection drain

## 2019-08-11 NOTE — Progress Notes (Signed)
PHARMACY - PHYSICIAN COMMUNICATION CRITICAL VALUE ALERT - BLOOD CULTURE IDENTIFICATION (BCID)  Brian Torres is an 58 y.o. male who presented to [facility] on 08/07/2019 with a chief complaint of IAI  Assessment: 10/5 BCx: 1/4 GNR anaerobic bottle (no ID on BCID) reported 10/9  Name of physician (or Provider) Contacted: Dr Cyndia Skeeters  Current antibiotics: Zosyn 3.375gm IV 18h over 4 hr for intra-abd infection  Changes to prescribed antibiotics recommended: None recommended   Results for orders placed or performed during the hospital encounter of 08/07/19  Blood Culture ID Panel (Reflexed) (Collected: 08/07/2019  3:31 PM)  Result Value Ref Range   Enterococcus species NOT DETECTED NOT DETECTED   Listeria monocytogenes NOT DETECTED NOT DETECTED   Staphylococcus species NOT DETECTED NOT DETECTED   Staphylococcus aureus (BCID) NOT DETECTED NOT DETECTED   Streptococcus species NOT DETECTED NOT DETECTED   Streptococcus agalactiae NOT DETECTED NOT DETECTED   Streptococcus pneumoniae NOT DETECTED NOT DETECTED   Streptococcus pyogenes NOT DETECTED NOT DETECTED   Acinetobacter baumannii NOT DETECTED NOT DETECTED   Enterobacteriaceae species NOT DETECTED NOT DETECTED   Enterobacter cloacae complex NOT DETECTED NOT DETECTED   Escherichia coli NOT DETECTED NOT DETECTED   Klebsiella oxytoca NOT DETECTED NOT DETECTED   Klebsiella pneumoniae NOT DETECTED NOT DETECTED   Proteus species NOT DETECTED NOT DETECTED   Serratia marcescens NOT DETECTED NOT DETECTED   Haemophilus influenzae NOT DETECTED NOT DETECTED   Neisseria meningitidis NOT DETECTED NOT DETECTED   Pseudomonas aeruginosa NOT DETECTED NOT DETECTED   Candida albicans NOT DETECTED NOT DETECTED   Candida glabrata NOT DETECTED NOT DETECTED   Candida krusei NOT DETECTED NOT DETECTED   Candida parapsilosis NOT DETECTED NOT DETECTED   Candida tropicalis NOT DETECTED NOT DETECTED    Minda Ditto PharmD 08/11/2019  10:05 AM

## 2019-08-11 NOTE — Progress Notes (Signed)
PT Cancellation Note  Patient Details Name: ALEXIOS BORTON MRN: [id number] DOB: 25-Jun-1961   Cancelled Treatment:    Reason Eval/Treat Not Completed: Patient declined. Pt scheduled to d/c today and pt politely declined PT services. He states he has all the equipment and knows how to negotiate stairs.  He and caregiver confirmed he was getting HHPT and they are going to reach out to her at d/c.   Galen Manila 08/11/2019, 11:02 AM

## 2019-08-12 LAB — CULTURE, BLOOD (ROUTINE X 2)

## 2019-08-13 LAB — CULTURE, BLOOD (ROUTINE X 2)

## 2019-08-14 LAB — AEROBIC/ANAEROBIC CULTURE W GRAM STAIN (SURGICAL/DEEP WOUND): Special Requests: NORMAL

## 2019-08-15 ENCOUNTER — Other Ambulatory Visit: Payer: Self-pay | Admitting: General Surgery

## 2019-08-15 DIAGNOSIS — T8143XA Infection following a procedure, organ and space surgical site, initial encounter: Secondary | ICD-10-CM

## 2019-08-15 LAB — CULTURE, BLOOD (ROUTINE X 2)
Culture: NO GROWTH
Culture: NO GROWTH
Special Requests: ADEQUATE
Special Requests: ADEQUATE

## 2019-08-16 LAB — SURGICAL PATHOLOGY

## 2019-08-23 ENCOUNTER — Other Ambulatory Visit: Payer: Managed Care, Other (non HMO)

## 2019-08-24 ENCOUNTER — Ambulatory Visit
Admission: RE | Admit: 2019-08-24 | Discharge: 2019-08-24 | Disposition: A | Discharge status: Home / Self Care | Payer: Managed Care, Other (non HMO) | Source: Ambulatory Visit | Attending: General Surgery | Admitting: General Surgery

## 2019-08-24 ENCOUNTER — Ambulatory Visit
Admission: RE | Admit: 2019-08-24 | Discharge: 2019-08-24 | Disposition: A | Discharge status: Home / Self Care | Payer: Managed Care, Other (non HMO) | Source: Ambulatory Visit | Attending: Radiology | Admitting: Radiology

## 2019-08-24 DIAGNOSIS — T8143XA Infection following a procedure, organ and space surgical site, initial encounter: Secondary | ICD-10-CM

## 2019-08-24 HISTORY — PX: IR RADIOLOGIST EVAL & MGMT: IMG5224

## 2019-08-24 MED ORDER — IOPAMIDOL (ISOVUE-300) INJECTION 61%
100.0000 mL | Freq: Once | INTRAVENOUS | Status: AC | PRN
  Administered 2019-08-24: 100 mL via INTRAVENOUS

## 2019-08-24 NOTE — Progress Notes (Signed)
Referring Physician(s): Dr Mamie Laurel  Chief Complaint: The patient is seen in follow up today s/p 08/09/19: Successful placement of a 12 French percutaneous drainage catheter into the intra-abdominal fluid collection.   History of present illness:  Postoperative fluid collection in the right upper quadrant status post Whipple procedure Hx Pancreatic Ca Whipple surgery 07/20/19: Dr Barry Dienes  Abscess collection and drain placement in IR 08/09/19  Doing well Denies pain;fever; chills Finished antibiotics yesterday OP of drain is minimal now x 3 days Cannot flush-- comes out  Scheduled today for CT and drain injection- possible drain removal    Past Medical History:  Diagnosis Date  . Cancer ([place]) 12/2018   pancreatic cancer  . Depression   . Diabetes mellitus without complication ([place])    type 2  . Dyslipidemia   . Erectile dysfunction   . Family history of breast cancer   . Family history of melanoma   . Hypertension    white coat syndrome  . Nicotine addiction     Past Surgical History:  Procedure Laterality Date  . BILIARY STENT PLACEMENT N/A 12/21/2018   Procedure: BILIARY STENT PLACEMENT;  Surgeon: Clarene Essex, MD;  [facility];  Service: Endoscopy;  Laterality: N/A;  . BILIARY STENT PLACEMENT N/A 01/24/2019   Procedure: BILIARY STENT PLACEMENT;  Surgeon: Arta Silence, MD;  [facility];  Service: Endoscopy;  Laterality: N/A;  . BIOPSY  12/21/2018   Procedure: BIOPSY;  Surgeon: Clarene Essex, MD;  [facility];  Service: Endoscopy;;  . ENDOSCOPIC RETROGRADE CHOLANGIOPANCREATOGRAPHY (ERCP) WITH PROPOFOL N/A 12/21/2018   Procedure: ENDOSCOPIC RETROGRADE CHOLANGIOPANCREATOGRAPHY (ERCP) WITH PROPOFOL;  Surgeon: Clarene Essex, MD;  [facility];  Service: Endoscopy;  Laterality: N/A;  . ENDOSCOPIC RETROGRADE CHOLANGIOPANCREATOGRAPHY (ERCP) WITH PROPOFOL N/A 01/24/2019   Procedure: ENDOSCOPIC RETROGRADE CHOLANGIOPANCREATOGRAPHY (ERCP)  WITH PROPOFOL;  Surgeon: Arta Silence, MD;  [facility];  Service: Endoscopy;  Laterality: N/A;  Balloon Sweep of Duct  . ESOPHAGOGASTRODUODENOSCOPY N/A 01/03/2019   Procedure: ESOPHAGOGASTRODUODENOSCOPY (EGD);  Surgeon: Arta Silence, MD;  Location: [name];  Service: Endoscopy;  Laterality: N/A;  . ESOPHAGOGASTRODUODENOSCOPY (EGD) WITH PROPOFOL N/A 01/24/2019   Procedure: ESOPHAGOGASTRODUODENOSCOPY (EGD) WITH PROPOFOL;  Surgeon: Arta Silence, MD;  [facility];  Service: Endoscopy;  Laterality: N/A;  stent removal  . EUS N/A 01/03/2019   Procedure: FULL UPPER ENDOSCOPIC ULTRASOUND (EUS) Linear not radial;  Surgeon: Arta Silence, MD;  [facility];  Service: Endoscopy;  Laterality: N/A;  . EUS N/A 01/24/2019   Procedure: FULL UPPER ENDOSCOPIC ULTRASOUND (EUS) RADIAL;  Surgeon: Arta Silence, MD;  [facility];  Service: Endoscopy;  Laterality: N/A;  . FINE NEEDLE ASPIRATION N/A 01/03/2019   Procedure: FINE NEEDLE ASPIRATION (FNA) LINEAR;  Surgeon: Arta Silence, MD;  [facility];  Service: Endoscopy;  Laterality: N/A;  . FINE NEEDLE ASPIRATION N/A 01/24/2019   Procedure: FINE NEEDLE ASPIRATION (FNA) LINEAR;  Surgeon: Arta Silence, MD;  [facility];  Service: Endoscopy;  Laterality: N/A;  . IR RADIOLOGIST EVAL & MGMT  08/24/2019  . LAPAROSCOPY N/A 07/20/2019   Procedure: LAPAROSCOPY DIAGNOSTIC;  Surgeon: [name], MD;  Location: [facility];  Service: General;  Laterality: N/A;  . LOWER EXTREMITY ANGIOGRAPHY N/A 10/18/2017   Procedure: LOWER EXTREMITY ANGIOGRAPHY - IVUS - LYSIS CATH;  Surgeon: Waynetta Sandy, MD;  Location: [facility];  Service: Cardiovascular;  Laterality: N/A;  . LOWER EXTREMITY ANGIOGRAPHY N/A 10/19/2017   Procedure: LOWER EXTREMITY ANGIOGRAPHY - LYSIS RECHECK;  Surgeon: Waynetta Sandy, MD;  Location: [facility];  Service: Cardiovascular;  Laterality: N/A;  .  PANCREATIC STENT PLACEMENT  12/21/2018   Procedure: PANCREATIC STENT PLACEMENT;  Surgeon: Clarene Essex, MD;  [facility];  Service: Endoscopy;;  . PEG PLACEMENT N/A 07/20/2019   Procedure: FEEDING TUBE;  Surgeon: [name], MD;  Location: [facility];  Service: General;  Laterality: N/A;  . PERIPHERAL VASCULAR BALLOON ANGIOPLASTY Left 10/19/2017   Procedure: PERIPHERAL VASCULAR BALLOON ANGIOPLASTY;  Surgeon: Waynetta Sandy, MD;  Location: [facility];  Service: Cardiovascular;  Laterality: Left;  Multiple Lower extremity venous sites  . PERIPHERAL VASCULAR INTERVENTION Left 10/18/2017   Procedure: PERIPHERAL VASCULAR INTERVENTION;  Surgeon: Waynetta Sandy, MD;  Location: [facility];  Service: Cardiovascular;  Laterality: Left;  . PORTACATH PLACEMENT Left 02/02/2019   Procedure: INSERTION PORT-A-CATH WITH POSSIBLE ULTRASOUND;  Surgeon: [name], MD;  Location: [facility];  Service: General;  Laterality: Left;  . REMOVAL OF STONES  12/21/2018   Procedure: REMOVAL OF STONES;  Surgeon: Clarene Essex, MD;  [facility];  Service: Endoscopy;;  . Joan Mayans  12/21/2018   Procedure: Joan Mayans;  Surgeon: Clarene Essex, MD;  [facility];  Service: Endoscopy;;  . STENT REMOVAL  01/24/2019   Procedure: STENT REMOVAL;  Surgeon: Arta Silence, MD;  [facility];  Service: Endoscopy;;  . WHIPPLE PROCEDURE N/A 07/20/2019   Procedure: WHIPPLE PROCEDURE;  Surgeon: [name], MD;  Location: [facility];  Service: General;  Laterality: N/A;    Allergies: Patient has no known allergies.  Medications: Prior to Admission medications   Medication Sig Start Date End Date Taking? Authorizing Provider  acetaminophen (TYLENOL) 500 MG tablet Take 1,000 mg by mouth every 6 (six) hours as needed for moderate pain or headache.    [provider]  Amino Acids-Protein Hydrolys (FEEDING SUPPLEMENT, PRO-STAT SUGAR FREE 64,)  LIQD Place 30 mLs into feeding tube 3 (three) times daily. 07/28/19   [name], MD  amoxicillin-clavulanate (AUGMENTIN) 875-125 MG tablet Take 1 tablet by mouth every 12 (twelve) hours. 08/11/19   Mercy Riding, MD  apixaban (ELIQUIS) 5 MG TABS tablet Take 1 tablet (5 mg total) by mouth 2 (two) times daily. 01/04/19   Arta Silence, MD  b complex vitamins tablet Take 1 tablet by mouth daily.    [provider]  CINNAMON PO Take 1,000 mg by mouth 2 (two) times daily.    [provider]  Coenzyme Q10 (COQ-10 PO) Take 1 tablet by mouth daily.    [provider]  famotidine (PEPCID AC) 10 MG tablet Take 10 mg by mouth 2 (two) times daily as needed for heartburn or indigestion.    [provider]  feeding supplement, ENSURE ENLIVE, (ENSURE ENLIVE) LIQD Take 237 mLs by mouth 3 (three) times daily between meals. 07/28/19   [name], MD  ferrous sulfate 325 (65 FE) MG EC tablet Take 325 mg by mouth 2 (two) times a day.     [provider]  glimepiride (AMARYL) 4 MG tablet Take 4 mg by mouth daily with breakfast.  10/13/17   [provider]  HUMALOG KWIKPEN 100 UNIT/ML KwikPen Inject 3-13 Units into the skin 2 (two) times daily as needed (if bs is over 150).  06/29/19   [provider]  Insulin Glargine (BASAGLAR KWIKPEN) 100 UNIT/ML SOPN Inject 19 Units into the skin at bedtime.  10/19/18   [provider]  lidocaine-prilocaine (EMLA) cream Apply 1 application topically as needed. Patient taking differently: Apply 1 application topically as needed (port access).  01/26/19   Ladell Pier, MD  lisinopril (PRINIVIL,ZESTRIL) 10 MG tablet Take 10 mg by mouth daily. 10/13/17   [provider]  LORazepam (ATIVAN) 0.5 MG tablet Take 1 tablet (0.5 mg total) by mouth every 8 (eight) hours as needed (for nausea). 05/01/19   Ladell Pier, MD  metFORMIN (GLUCOPHAGE) 1000 MG tablet Take 1,000 mg by mouth 2 (two) times daily.  10/13/17   [provider]  methocarbamol (ROBAXIN) 500 MG tablet Take 1 tablet (500 mg total) by mouth every 6 (six) hours as needed for muscle spasms. 07/28/19   [name], MD  Omega-3 Fatty Acids (FISH OIL) 1000 MG CAPS Take 1,000 mg by mouth 2 (two) times daily.     [provider]  ondansetron (ZOFRAN-ODT) 4 MG disintegrating tablet Take 1 tablet (4 mg total) by mouth every 6 (six) hours as needed for nausea. 07/28/19   [name], MD  oxyCODONE (OXY IR/ROXICODONE) 5 MG immediate release tablet Take 1-2 tablets (5-10 mg total) by mouth every 4 (four) hours as needed for moderate pain, severe pain or breakthrough pain. 07/28/19   [name], MD  pioglitazone (ACTOS) 45 MG tablet Take 45 mg by mouth daily. 10/13/17   [provider]  prochlorperazine (COMPAZINE) 10 MG tablet Take 1 tablet (10 mg total) by mouth every 6 (six) hours as needed for nausea or vomiting. 04/14/19   Ladell Pier, MD  saccharomyces boulardii (FLORASTOR) 250 MG capsule Take 1 capsule (250 mg total) by mouth 2 (two) times daily. 08/11/19   Mercy Riding, MD     Family History  Problem Relation Age of Onset  . Diabetes Mother   . Diabetes Father   . CAD Father   . Melanoma Father   . Breast cancer Paternal Aunt 43  . COPD Maternal Aunt   . Melanoma Paternal Uncle   . Stroke Maternal Grandmother   . Tuberculosis Paternal Grandmother   . Cancer Neg Hx     Social History   Socioeconomic History  . Marital status: Married    Spouse name: Not on file  . Number of children: Not on file  . Years of education: Not on file  . Highest education level: Not on file  Occupational History  . Not on file  Social Needs  . Financial resource strain: Not on file  . Food insecurity    Worry: Not on file    Inability: Not on file  . Transportation needs    Medical: Not on file    Non-medical: Not on file  Tobacco Use  . Smoking status: Current Every Day Smoker    Packs/day: 1.00     Types: Cigarettes  . Smokeless tobacco: Never Used  Substance and Sexual Activity  . Alcohol use: No    Frequency: Never  . Drug use: No  . Sexual activity: Not on file  Lifestyle  . Physical activity    Days per week: Not on file    Minutes per session: Not on file  . Stress: Not on file  Relationships  . Social Herbalist on phone: Not on file    Gets together: Not on file    Attends religious service: Not on file    Active member of club or organization: Not on file    Attends meetings of clubs or organizations:  Not on file    Relationship status: Not on file  Other Topics Concern  . Not on file  Social History Narrative  . Not on file     Vital Signs: BP 125/80   Pulse 87   Temp 98.4 F (36.9 C)   SpO2 95%   Physical Exam Skin:    General: Skin is warm and dry.     Comments: Site is clean and dry NT no bleeding No sign of infection  CT shows No collection per Dr Charletta Cousin injection shows No fistula Contrast actually comes out of skin site almost immediatly  Drain removal per Dr Pascal Lux No complication Dressing placed     Imaging: Ir Radiologist Eval & Mgmt  Result Date: 08/24/2019 Please refer to notes tab for details about interventional procedure. (Op Note)   Labs:  CBC: Recent Labs    08/07/19 1526 08/08/19 0455  08/08/19 2045 08/09/19 0923 08/10/19 0500 08/11/19 0553  WBC 6.7 10.8*  --   --  8.6  --  6.7  HGB 9.1* 7.0*   < > 7.7* 7.9* 7.7* 8.7*  HCT 27.9* 22.1*   < > 24.1* 24.3* 24.0* 26.7*  PLT 193 139*  --   --  161  --  195   < > = values in this interval not displayed.    COAGS: Recent Labs    12/19/18 2153 12/20/18 0626 12/20/18 1935 12/22/18 0159 12/23/18 0014 07/12/19 0959 07/20/19 1610  INR 1.47 1.22  --   --   --  1.0 1.1  APTT  --  33 92* 80* 65*  --   --     BMP: Recent Labs    08/08/19 0455 08/09/19 0923 08/10/19 0500 08/11/19 0553  NA 135 133* 135 135  K 4.2 3.6 3.6 3.8  CL 106 101 104  101  CO2 21* 16* 24 23  GLUCOSE 163* 119* 106* 111*  BUN 24* 10 6 <5*  CALCIUM 7.2* 7.9* 7.9* 8.2*  CREATININE 1.25* 0.88 0.87 0.94  GFRNONAA >60 >60 >60 >60  GFRAA >60 >60 >60 >60    LIVER FUNCTION TESTS: Recent Labs    07/25/19 0500 07/27/19 0127 08/07/19 1526 08/09/19 0923  BILITOT 0.8 0.7 0.7 0.3  AST 24 86* 27 18  ALT 24 74* 23 19  ALKPHOS 190* 380* 262* 210*  PROT 5.2* 5.3* 6.3* 6.0*  ALBUMIN 2.4* 2.2* 2.6* 2.2*    Assessment:  Pancreatic Ca Whipple surgery 07/20/19 Abscess formation and drain placement in IR 08/09/19 Now no OP x 3 days Afeb CT shows no collection Injection shows no fistula Drain removed Follow with Dr Barry Dienes for plan  Signed: Lavonia Drafts, PA-C 08/24/2019, 2:44 PM   Please refer to Dr. Pascal Lux attestation of this note for management and plan.

## 2019-08-29 ENCOUNTER — Inpatient Hospital Stay: Payer: Managed Care, Other (non HMO)

## 2019-08-29 ENCOUNTER — Inpatient Hospital Stay: Payer: Managed Care, Other (non HMO) | Admitting: Nurse Practitioner

## 2019-08-30 ENCOUNTER — Telehealth: Payer: Self-pay | Admitting: *Deleted

## 2019-08-30 NOTE — Telephone Encounter (Signed)
Left VM for patient that Dr. Benay Spice will get him back in after he has the MRI that Dr. Marcello Moores is going to order. Suggested he call [facility] in a week if he has not heard from them in regards to scheduling the scan.

## 2019-08-31 ENCOUNTER — Other Ambulatory Visit: Payer: Self-pay | Admitting: General Surgery

## 2019-08-31 DIAGNOSIS — C250 Malignant neoplasm of head of pancreas: Secondary | ICD-10-CM

## 2019-09-04 ENCOUNTER — Other Ambulatory Visit: Payer: Self-pay | Admitting: General Surgery

## 2019-09-04 DIAGNOSIS — S0540XA Penetrating wound of orbit with or without foreign body, unspecified eye, initial encounter: Secondary | ICD-10-CM

## 2019-09-05 ENCOUNTER — Ambulatory Visit
Admission: RE | Admit: 2019-09-05 | Discharge: 2019-09-05 | Disposition: A | Discharge status: Home / Self Care | Payer: Managed Care, Other (non HMO) | Source: Ambulatory Visit | Attending: General Surgery | Admitting: General Surgery

## 2019-09-05 ENCOUNTER — Other Ambulatory Visit: Payer: Self-pay

## 2019-09-05 DIAGNOSIS — C250 Malignant neoplasm of head of pancreas: Secondary | ICD-10-CM

## 2019-09-05 DIAGNOSIS — S0540XA Penetrating wound of orbit with or without foreign body, unspecified eye, initial encounter: Secondary | ICD-10-CM

## 2019-09-05 MED ORDER — GADOBENATE DIMEGLUMINE 529 MG/ML IV SOLN
19.0000 mL | Freq: Once | INTRAVENOUS | Status: AC | PRN
  Administered 2019-09-05: 19 mL via INTRAVENOUS

## 2019-09-07 ENCOUNTER — Telehealth: Payer: Self-pay | Admitting: Oncology

## 2019-09-07 ENCOUNTER — Other Ambulatory Visit: Payer: Self-pay | Admitting: *Deleted

## 2019-09-07 ENCOUNTER — Telehealth: Payer: Self-pay | Admitting: General Surgery

## 2019-09-07 DIAGNOSIS — C250 Malignant neoplasm of head of pancreas: Secondary | ICD-10-CM

## 2019-09-07 NOTE — Progress Notes (Signed)
Per Dr. Benay Spice: Schedule for OV on 09/14/19 with lab/flush. See BS or NP. Scheduling message sent.

## 2019-09-07 NOTE — Telephone Encounter (Signed)
Scheduled appt per 11/5 sch message- unable to reach pt . Left message with appt date and time  \

## 2019-09-07 NOTE — Telephone Encounter (Signed)
Discussed MR with patient.  Afebrile.  Having some pus like drainage from former drain site.  Eating a bit better.  Main complaint is still just fatigue.    Will need repeat tumor marker.  MR is not conclusive between infection and tumor.  Will need repeat imaging.

## 2019-09-14 ENCOUNTER — Inpatient Hospital Stay: Payer: Managed Care, Other (non HMO) | Attending: Oncology | Admitting: Nurse Practitioner

## 2019-09-14 ENCOUNTER — Encounter: Payer: Self-pay | Admitting: Nurse Practitioner

## 2019-09-14 ENCOUNTER — Inpatient Hospital Stay: Payer: Managed Care, Other (non HMO)

## 2019-09-14 ENCOUNTER — Other Ambulatory Visit: Payer: Self-pay

## 2019-09-14 VITALS — BP 124/81 | HR 97 | Temp 98.7°F | Resp 17 | Ht 76.0 in | Wt 187.5 lb

## 2019-09-14 DIAGNOSIS — Z7901 Long term (current) use of anticoagulants: Secondary | ICD-10-CM | POA: Insufficient documentation

## 2019-09-14 DIAGNOSIS — N189 Chronic kidney disease, unspecified: Secondary | ICD-10-CM | POA: Insufficient documentation

## 2019-09-14 DIAGNOSIS — R918 Other nonspecific abnormal finding of lung field: Secondary | ICD-10-CM | POA: Insufficient documentation

## 2019-09-14 DIAGNOSIS — E1122 Type 2 diabetes mellitus with diabetic chronic kidney disease: Secondary | ICD-10-CM | POA: Diagnosis not present

## 2019-09-14 DIAGNOSIS — C250 Malignant neoplasm of head of pancreas: Secondary | ICD-10-CM | POA: Diagnosis present

## 2019-09-14 DIAGNOSIS — Z86718 Personal history of other venous thrombosis and embolism: Secondary | ICD-10-CM | POA: Diagnosis not present

## 2019-09-14 DIAGNOSIS — I129 Hypertensive chronic kidney disease with stage 1 through stage 4 chronic kidney disease, or unspecified chronic kidney disease: Secondary | ICD-10-CM | POA: Insufficient documentation

## 2019-09-14 DIAGNOSIS — Z9221 Personal history of antineoplastic chemotherapy: Secondary | ICD-10-CM | POA: Insufficient documentation

## 2019-09-14 DIAGNOSIS — M21371 Foot drop, right foot: Secondary | ICD-10-CM | POA: Insufficient documentation

## 2019-09-14 DIAGNOSIS — C787 Secondary malignant neoplasm of liver and intrahepatic bile duct: Secondary | ICD-10-CM | POA: Diagnosis not present

## 2019-09-14 DIAGNOSIS — G893 Neoplasm related pain (acute) (chronic): Secondary | ICD-10-CM | POA: Insufficient documentation

## 2019-09-14 DIAGNOSIS — Z95828 Presence of other vascular implants and grafts: Secondary | ICD-10-CM

## 2019-09-14 MED ORDER — SODIUM CHLORIDE 0.9% FLUSH
10.0000 mL | INTRAVENOUS | Status: DC | PRN
  Administered 2019-09-14: 10 mL
  Filled 2019-09-14: qty 10

## 2019-09-14 MED ORDER — HEPARIN SOD (PORK) LOCK FLUSH 100 UNIT/ML IV SOLN
500.0000 [IU] | Freq: Once | INTRAVENOUS | Status: AC | PRN
  Administered 2019-09-14: 14:00:00 500 [IU]
  Filled 2019-09-14: qty 5

## 2019-09-14 NOTE — Patient Instructions (Signed)

## 2019-09-14 NOTE — Progress Notes (Addendum)
[facility] OFFICE PROGRESS NOTE   Diagnosis: Pancreas cancer  INTERVAL HISTORY:   Brian Torres returns for follow-up.  He feels he is slowly recovering from surgery and the abdominal abscess.  He notes puslike drainage from a previous drain site.  The midline wound is being packed.  He denies fever.  No chills.  Overall good appetite.  No significant abdominal pain.  Bowels moving on average twice a day.  He denies bleeding.  He has periodic nausea.  Objective:  Vital signs in last 24 hours:  Blood pressure 124/81, pulse 97, temperature 98.7 F (37.1 C), temperature source Temporal, resp. rate 17, height 6\' 4"  (1.93 m), weight 187 lb 8 oz (85 kg), SpO2 100 %.   Abdomen: Firm fullness right upper quadrant, question hepatomegaly. Vascular: No leg edema.  The left lower leg is larger than the right lower leg. Neuro: Alert and oriented. Skin: Small amount of puslike drainage expressed from a drain site at the right abdomen.  Midline wound is packed.  Upper aspect of the wound with thick drainage. Port-A-Cath without erythema.  Lab Results:  Lab Results  Component Value Date   WBC 6.7 08/11/2019   HGB 8.7 (L) 08/11/2019   HCT 26.7 (L) 08/11/2019   MCV 90.5 08/11/2019   PLT 195 08/11/2019   NEUTROABS 6.5 08/07/2019    Imaging:  No results found.  Medications: I have reviewed the patient's current medications.  Assessment/Plan: 1. Pancreas head mass  Presented with jaundice, bilirubin elevated at 21.4  CTs abdomen/pelvis 12/20/2018-severe intrahepatic and extrahepatic biliary dilatation due to a possible 3.5 x 2.5 cm solid mass in the pancreatic head. Pancreatic ductal dilatation noted as well with multiple cystic lesions in the pancreatic tail and body.   CT chest 12/20/2018-occasional nonspecific small pulmonary nodules felt to likely be related to prior infection or inflammation.   ERCP by Dr. Watt Climes 12/21/2018. The major papilla was on the rim of a  diverticulum. The minor papilla appeared to be bulging. A biopsy of the duodenum adjacent to the minor papilla was performed. Plastic stent was placed into the ventral pancreatic duct. Biliary sphincterotomy performed. Covered metal stent placed into the common bile duct. Bile duct brushings showed benign reactive/reparative changes. Biopsy of polypoid duodenal mucosa suggestive of nodular peptic duodenitis, negative for dysplasia or malignancy.   Upper EUS by Dr. Paulita Fujita on 01/03/2019 showed a few cystic lesions in the pancreatic body and pancreatic tail. A stent was visualized in the common bile duct. Mass identified in the pancreatic head with fine-needle aspiration performed. Lesion appeared to abut or potentially superficially invade the superior mesenteric vein. No lymphadenopathy. No involvement of the superior mesenteric artery or celiac artery. Pathology returned suspicious for malignancy. Dr. Paulita Fujita notes if pathology shows adenocarcinoma the lesion would be staged T3 N0 MX.  Repeat ERCP and placement of a metal bile duct stent 01/24/2019  EUS 01/24/2019, T3N0 pancreas head mass with abutment of the SMV, FNA of the pancreas mass revealed adenocarcinoma, cytology from the bile duct stent revealed adenocarcinoma  Cycle 1 FOLFIRINOX4/04/2019  Cycle 2 FOLFIRINOX4/20/2020, Udenyca added  Cycle 3 FOLFIRINOX 03/09/2019, Irinotecan and 5-fluorouracil dose reduced, Udenyca held  Cycle 4 FOLFIRINOX 03/21/2019-Udenyca held  Cycle 5 FOLFIRINOX6/01/2019  Cycle 6 FOLFIRINOX 04/24/2019 (oxaliplatin dose reduced due to poor tolerance of chemotherapy)  CT 05/11/2019-decreased size of pancreas head mass, unchanged peripancreatic lymph node, no evidence of metastatic disease, improved/resolved fluid collections at the pancreas tail  Cycle 7 FOLFIRINOX 05/22/2019  Cycle 8  FOLFIRINOX 06/06/2019  07/20/2019 pancreaticoduodenectomy 07/20/2019-pT2, pN0 (3.6 cm poorly differentiated invasive ductal  adenocarcinoma; tumor invades duodenal wall, peripancreatic wall and superior mesenteric groove; perineural and lymphovascular space invasion present; margins negative; chronic cholecystitis; no carcinoma identified in 8 lymph nodes.  Treatment effect absent, extensive residual cancer with no evident tumor regression)  MRI abdomen 09/05/2019-reaccumulation of fluid along the perihepatic margin following presumed removal of the pigtail drainage catheter that was present in this area on the study of 08/24/2019; some fluid in the region of the porta hepatis and pancreaticobiliary limb; small collection of fluid just inferior to the interlobar fissure in the liver along the course of the previous drain; tract extending from this area to the midline abdominal wound; innumerable areas of abnormality seen on T2 and diffusion throughout the liver, largest in the range of 1 cm.  All areas of the liver affected, both right and left hepatic lobes.  Along the gallbladder fossa is a peripherally enhancing 1.8 x 1.3 cm focus. 2. Biliary obstruction status post stent placement 3. History of left lower extremity DVT 2017, 2018-on Eliquis. 4. Type 2 diabetes 5. Hypertension 6. Chronic kidney disease 7. Tobacco use 8. Anemia, macrocytic. Transfused 2 units of blood 12/20/2018.On oral iron. Improved 02/01/2019 and 02/06/2019. 9. Port-A-Cath placement 02/02/2019, Dr. Barry Dienes 10. 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 Hospitalized 08/07/2019 through 08/11/2019 with postsurgical intra-abdominal fluid collection.  CT 08/07/2019 with 2 rim-enhancing fluid collections in the right upper quadrant concerning for abscesses, moderate wall thickening and pericolonic inflammatory changes involving the distal ascending, transverse and descending colon consistent with colitis.  Blood culture 08/07/2019 + for Prevotella buccae beta-lactamase positive; status post  CT-guided aspiration of intra-abdominal fluid collection and drain placement on 08/09/2019, culture with multiple organisms present, none predominant; follow-up CT 08/24/2019 with resolution of dominant fluid collection within the right upper abdominal quadrant, tiny serpiginous fluid collection within the ventral aspect of the right upper abdomen, interval development of multiple ill-defined hypoattenuating liver lesions; MRI abdomen 09/05/2019 reaccumulation of fluid along the perihepatic margin following presumed removal of the pigtail drainage catheter that was present in this area on the study of 08/24/2019; some fluid in the region of the porta hepatis and pancreaticobiliary limb; small collection of fluid just inferior to the interlobar fissure in the liver along the course of the previous drain; tract extending from this area to the midline abdominal wound; innumerable areas of abnormality seen on T2 and diffusion throughout the liver, largest in the range of 1 cm.  All areas of the liver affected, both right and left hepatic lobes.  Along the gallbladder fossa is a peripherally enhancing 1.8 x 1.3 cm focus.   Disposition: Mr. Depena is recovering from the Whipple procedure and abscesses.  Recent follow-up CT showed the interval development of multiple liver lesions.  MRI on 09/05/2019 showed innumerable lesions throughout the liver.  We reviewed the results of the studies with him at today's visit.  Dr. Benay Spice recommends a referral for biopsy and/or aspiration of a liver lesion, question cancer versus infection.  Mr. Marshall agrees with this plan.  We will plan to see him back on 09/25/2019 to review the biopsy results.  He will contact the office in the interim with any problems.  Patient seen with Dr. Benay Spice.    [name] ANP/GNP-BC   09/14/2019  3:55 PM  This was a shared visit with [name].  Mr. Dickel was interviewed and  examined.  We reviewed the results of the pancreas resection  pathology.  He remains at high risk of developing recurrent pancreas cancer.  We will consider adjuvant therapy based on work-up of the liver lesions and recovery from the infection.  He will be referred for aspiration/biopsy of the liver lesion.  Julieanne Manson, MD

## 2019-09-15 ENCOUNTER — Telehealth: Payer: Self-pay | Admitting: Oncology

## 2019-09-15 ENCOUNTER — Encounter (HOSPITAL_COMMUNITY): Payer: Self-pay

## 2019-09-15 NOTE — Progress Notes (Signed)
  Brian Torres. [employer]" Male, 58 y.o., [birth year] MRN:  [id number] Phone:  [phone] Brian Torres) PCP:  Cari Caraway, MD Coverage:  Cign[employer]/C[employer] Managed Next Appt With Radiology ([facility]) 09/26/2019 at 1:00 PM  RE: Biopsy Received: Today Message Contents  Brian Mckusick, DO  Brian Torres  OK for US guided biopsy of liver mass. Would also send a core for culture.   There may not be correlate on the [place], which may ultimately be deferred.   Brian Torres   Previous Messages  ----- Message -----  From: Brian Torres  Sent: 09/15/2019  8:47 AM EST  To: Ir Procedure Requests  Subject: Biopsy                      Procedure Requested: US Biopsy    Reason for Procedure: Aspirate or biopsy liver lesion, infection v pancreas cancer;    Provider Requesting: Owens Shark  Provider Telephone: [phone]    Other Info: Rad exams in Epic

## 2019-09-15 NOTE — Telephone Encounter (Signed)
Scheduled per los. Called and left msg. Mailed printout  °

## 2019-09-18 ENCOUNTER — Encounter (HOSPITAL_COMMUNITY): Payer: Self-pay

## 2019-09-18 LAB — CANCER ANTIGEN 19-9: CA 19-9: 114891 U/mL — ABNORMAL HIGH (ref 0–35)

## 2019-09-18 NOTE — Progress Notes (Signed)
  Louellen Molder. [employer]" Male, 58 y.o., September 22, 1961 MRN:  [id number] Phone:  [phone] Jerilynn Mages) PCP:  Cari Caraway, MD Coverage:  C[employer]/C[employer] Managed Next Appt With Radiology ([facility]) 09/26/2019 at 1:00 PM  RE: Biopsy Received: Today Message Contents  Owens Shark, NP  Lennox Solders E  Yes, fine for him to hold for 2 days prior to procedure.   Thanks,  Lattie Haw   Previous Messages  ----- Message -----  From: Lenore Cordia  Sent: 09/15/2019  6:28 PM EST  To: Owens Shark, NP  Subject: FW: Biopsy                    Hello        Mr. Null in on Eliquis, he needs to hold it for 2 days prior to his biopsy.   I need your permission to instruct him

## 2019-09-25 ENCOUNTER — Inpatient Hospital Stay: Payer: Managed Care, Other (non HMO) | Admitting: Oncology

## 2019-09-25 ENCOUNTER — Other Ambulatory Visit: Payer: Self-pay | Admitting: Radiology

## 2019-09-26 ENCOUNTER — Other Ambulatory Visit: Payer: Self-pay

## 2019-09-26 ENCOUNTER — Encounter (HOSPITAL_COMMUNITY): Payer: Self-pay

## 2019-09-26 ENCOUNTER — Ambulatory Visit (HOSPITAL_COMMUNITY)
Admission: RE | Admit: 2019-09-26 | Discharge: 2019-09-26 | Disposition: A | Discharge status: Home / Self Care | Payer: Managed Care, Other (non HMO) | Source: Ambulatory Visit | Attending: Nurse Practitioner | Admitting: Nurse Practitioner

## 2019-09-26 DIAGNOSIS — Z803 Family history of malignant neoplasm of breast: Secondary | ICD-10-CM | POA: Insufficient documentation

## 2019-09-26 DIAGNOSIS — K769 Liver disease, unspecified: Secondary | ICD-10-CM | POA: Insufficient documentation

## 2019-09-26 DIAGNOSIS — Z87891 Personal history of nicotine dependence: Secondary | ICD-10-CM | POA: Diagnosis not present

## 2019-09-26 DIAGNOSIS — E785 Hyperlipidemia, unspecified: Secondary | ICD-10-CM | POA: Diagnosis not present

## 2019-09-26 DIAGNOSIS — F329 Major depressive disorder, single episode, unspecified: Secondary | ICD-10-CM | POA: Insufficient documentation

## 2019-09-26 DIAGNOSIS — Z79899 Other long term (current) drug therapy: Secondary | ICD-10-CM | POA: Diagnosis not present

## 2019-09-26 DIAGNOSIS — Z808 Family history of malignant neoplasm of other organs or systems: Secondary | ICD-10-CM | POA: Diagnosis not present

## 2019-09-26 DIAGNOSIS — I10 Essential (primary) hypertension: Secondary | ICD-10-CM | POA: Diagnosis not present

## 2019-09-26 DIAGNOSIS — Z7901 Long term (current) use of anticoagulants: Secondary | ICD-10-CM | POA: Insufficient documentation

## 2019-09-26 DIAGNOSIS — Z792 Long term (current) use of antibiotics: Secondary | ICD-10-CM | POA: Insufficient documentation

## 2019-09-26 DIAGNOSIS — C250 Malignant neoplasm of head of pancreas: Secondary | ICD-10-CM | POA: Insufficient documentation

## 2019-09-26 DIAGNOSIS — Z794 Long term (current) use of insulin: Secondary | ICD-10-CM | POA: Insufficient documentation

## 2019-09-26 DIAGNOSIS — E119 Type 2 diabetes mellitus without complications: Secondary | ICD-10-CM | POA: Insufficient documentation

## 2019-09-26 LAB — CBC
HCT: 29 % — ABNORMAL LOW (ref 39.0–52.0)
Hemoglobin: 9.6 g/dL — ABNORMAL LOW (ref 13.0–17.0)
MCH: 29 pg (ref 26.0–34.0)
MCHC: 33.1 g/dL (ref 30.0–36.0)
MCV: 87.6 fL (ref 80.0–100.0)
Platelets: 225 10*3/uL (ref 150–400)
RBC: 3.31 MIL/uL — ABNORMAL LOW (ref 4.22–5.81)
RDW: 15.8 % — ABNORMAL HIGH (ref 11.5–15.5)
WBC: 8.1 10*3/uL (ref 4.0–10.5)
nRBC: 0 % (ref 0.0–0.2)

## 2019-09-26 LAB — APTT: aPTT: 53 seconds — ABNORMAL HIGH (ref 24–36)

## 2019-09-26 LAB — PROTIME-INR
INR: 1.2 (ref 0.8–1.2)
Prothrombin Time: 15.2 seconds (ref 11.4–15.2)

## 2019-09-26 LAB — GLUCOSE, CAPILLARY: Glucose-Capillary: 123 mg/dL — ABNORMAL HIGH (ref 70–99)

## 2019-09-26 MED ORDER — LIDOCAINE HCL 1 % IJ SOLN
INTRAMUSCULAR | Status: AC
  Filled 2019-09-26: qty 20

## 2019-09-26 MED ORDER — SODIUM CHLORIDE 0.9 % IV SOLN
INTRAVENOUS | Status: DC
  Administered 2019-09-26: 12:00:00 via INTRAVENOUS

## 2019-09-26 MED ORDER — HEPARIN SOD (PORK) LOCK FLUSH 100 UNIT/ML IV SOLN
500.0000 [IU] | Freq: Once | INTRAVENOUS | Status: AC
  Administered 2019-09-26: 500 [IU] via INTRAVENOUS

## 2019-09-26 MED ORDER — LIDOCAINE HCL (PF) 1 % IJ SOLN
INTRAMUSCULAR | Status: AC | PRN
  Administered 2019-09-26: 10 mL

## 2019-09-26 MED ORDER — GELATIN ABSORBABLE 12-7 MM EX MISC
CUTANEOUS | Status: AC
  Filled 2019-09-26: qty 1

## 2019-09-26 MED ORDER — FENTANYL CITRATE (PF) 100 MCG/2ML IJ SOLN
INTRAMUSCULAR | Status: AC | PRN
  Administered 2019-09-26 (×2): 50 ug via INTRAVENOUS

## 2019-09-26 MED ORDER — MIDAZOLAM HCL 2 MG/2ML IJ SOLN
INTRAMUSCULAR | Status: AC
  Filled 2019-09-26: qty 4

## 2019-09-26 MED ORDER — MIDAZOLAM HCL 2 MG/2ML IJ SOLN
INTRAMUSCULAR | Status: AC | PRN
  Administered 2019-09-26: 1 mg via INTRAVENOUS
  Administered 2019-09-26: 2 mg via INTRAVENOUS
  Administered 2019-09-26: 1 mg via INTRAVENOUS

## 2019-09-26 MED ORDER — HEPARIN SOD (PORK) LOCK FLUSH 100 UNIT/ML IV SOLN
INTRAVENOUS | Status: AC
  Filled 2019-09-26: qty 5

## 2019-09-26 MED ORDER — FENTANYL CITRATE (PF) 100 MCG/2ML IJ SOLN
INTRAMUSCULAR | Status: AC
  Filled 2019-09-26: qty 2

## 2019-09-26 NOTE — Procedures (Signed)
Interventional Radiology Procedure Note  Procedure: US Guided Biopsy of liver  Complications: None  Estimated Blood Loss: < 10 mL  Findings: 18 G core biopsy of liver lesions performed under US guidance.  Three core samples obtained and sent to Pathology.  Venetia Night. Kathlene Cote, M.D Pager:  [phone]

## 2019-09-26 NOTE — Discharge Instructions (Signed)
For any questions or concerns call [phone]; For after hours call [phone] and ask for ON CALL MD    Liver Biopsy, Care After These instructions give you information on caring for yourself after your procedure. Your doctor may also give you more specific instructions. Call your doctor if you have any problems or questions after your procedure. What can I expect after the procedure? After the procedure, it is common to have:  Pain and soreness where the biopsy was done.  Bruising around the area where the biopsy was done.  Sleepiness and be tired for a few days. Follow these instructions at home: Medicines  Take over-the-counter and prescription medicines only as told by your doctor.  If you were prescribed an antibiotic medicine, take it as told by your doctor. Do not stop taking the antibiotic even if you start to feel better.  Do not take medicines such as aspirin and ibuprofen. These medicines can thin your blood. Do not take these medicines unless your doctor tells you to take them.  If you are taking prescription pain medicine, take actions to prevent or treat constipation. Your doctor may recommend that you: ? Drink enough fluid to keep your pee (urine) clear or pale yellow. ? Take over-the-counter or prescription medicines. ? Eat foods that are high in fiber, such as fresh fruits and vegetables, whole grains, and beans. ? Limit foods that are high in fat and processed sugars, such as fried and sweet foods. Caring for your cut  Follow instructions from your doctor about how to take care of your cuts from surgery (incisions). Make sure you: ? Wash your hands with soap and water before you change your bandage (dressing). If you cannot use soap and water, use hand sanitizer. ? Change your bandage as told by your doctor. ? Leave stitches (sutures), skin glue, or skin tape (adhesive) strips in place. They may need to stay in place for 2 weeks or longer. If tape strips get  loose and curl up, you may trim the loose edges. Do not remove tape strips completely unless your doctor says it is okay.  Check your cuts every day for signs of infection. Check for: ? Redness, swelling, or more pain. ? Fluid or blood. ? Pus or a bad smell. ? Warmth.  Do not take baths, swim, or use a hot tub until your doctor says it is okay to do so. Activity   Rest at home for 1-2 days or as told by your doctor. ? Avoid sitting for a long time without moving. Get up to take short walks every 1-2 hours.  Return to your normal activities as told by your doctor. Ask what activities are safe for you.  Do not do these things in the first 24 hours: ? Drive. ? Use machinery. ? Take a bath or shower.  Do not lift more than 10 pounds (4.5 kg) or play contact sports for the first 2 weeks. General instructions   Do not drink alcohol in the first week after the procedure.  Have someone stay with you for at least 24 hours after the procedure.  Get your test results. Ask your doctor or the department that is doing the test: ? When will my results be ready? ? How will I get my results? ? What are my treatment options? ? What other tests do I need? ? What are my next steps?  Keep all follow-up visits as told by your doctor. This is important. Contact a doctor if:  A cut bleeds and leaves more than just a small spot of blood.  A cut is red, puffs up (swells), or hurts more than before.  Fluid or something else comes from a cut.  A cut smells bad.  You have a fever or chills. Get help right away if:  You have swelling, bloating, or pain in your belly (abdomen).  You get dizzy or faint.  You have a rash.  You feel sick to your stomach (nauseous) or throw up (vomit).  You have trouble breathing, feel short of breath, or feel faint.  Your chest hurts.  You have problems talking or seeing.  You have trouble with your balance or moving your arms or  legs. Summary  After the procedure, it is common to have pain, soreness, bruising, and tiredness.  Your doctor will tell you how to take care of yourself at home. Change your bandage, take your medicines, and limit your activities as told by your doctor.  Call your doctor if you have symptoms of infection. Get help right away if your belly swells, your cut bleeds a lot, or you have trouble talking or breathing. This information is not intended to replace advice given to you by your health care provider. Make sure you discuss any questions you have with your health care provider. Document Released: 07/28/2008 Document Revised: 10/29/2017 Document Reviewed: 10/29/2017 Elsevier Patient Education  [place]. Moderate Conscious Sedation, Adult, Care After These instructions provide you with information about caring for yourself after your procedure. Your health care provider may also give you more specific instructions. Your treatment has been planned according to current medical practices, but problems sometimes occur. Call your health care provider if you have any problems or questions after your procedure. What can I expect after the procedure? After your procedure, it is common:  To feel sleepy for several hours.  To feel clumsy and have poor balance for several hours.  To have poor judgment for several hours.  To vomit if you eat too soon. Follow these instructions at home: For at least 24 hours after the procedure:   Do not: ? Participate in activities where you could fall or become injured. ? Drive. ? Use heavy machinery. ? Drink alcohol. ? Take sleeping pills or medicines that cause drowsiness. ? Make important decisions or sign legal documents. ? Take care of children on your own.  Rest. Eating and drinking  Follow the diet recommended by your health care provider.  If you vomit: ? Drink water, juice, or soup when you can drink without vomiting. ? Make sure you  have little or no nausea before eating solid foods. General instructions  Have a responsible adult stay with you until you are awake and alert.  Take over-the-counter and prescription medicines only as told by your health care provider.  If you smoke, do not smoke without supervision.  Keep all follow-up visits as told by your health care provider. This is important. Contact a health care provider if:  You keep feeling nauseous or you keep vomiting.  You feel light-headed.  You develop a rash.  You have a fever. Get help right away if:  You have trouble breathing. This information is not intended to replace advice given to you by your health care provider. Make sure you discuss any questions you have with your health care provider. Document Released: 08/09/2013 Document Revised: 10/01/2017 Document Reviewed: 02/08/2016 Elsevier Patient Education  2020 [employer].

## 2019-09-26 NOTE — H&P (Signed)
Referring Physician(s): Sherrill,B  Supervising Physician: Aletta Edouard  Patient Status:  [facility]  Chief Complaint:  "I'm having a liver biopsy"  Subjective: Patient familiar to IR service from right upper quadrant fluid collection drainage on 08/09/2019 and drain removal on 08/24/2019.  He has a history of pancreatic carcinoma and is status post  Whipple procedure on 07/20/19.  MRI of abdomen performed on 09/05/2019 revealed:  1. Re-accumulation of fluid adjacent to the liver following drain removal with other areas of fluid collection adjacent to liver with somewhat distant to biliary and pancreatic limbs. The more midline of these areas which likely was along the tract of a previous surgical drain extends toward the midline abdominal wound. Biliary or pancreatic leak are both considered. Based on the signal of the perihepatic collection of small amount of hematoma in this area is also a possibility. 2. Signs of numerous hepatic lesions less than a cm with some peripheral enhancement and restricted diffusion, concerning for metastatic disease. However, given biliary enhancement in the setting of recent Whipple consider a possibility of micro abscesses in the setting of cholangitis. Ultrasound guided sampling may be helpful for further evaluation. 3. Signs of generalized bowel edema more pronounced in the biliary pancreatic limb  Request now received for image guided liver lesion biopsy for further evaluation.  He currently denies fever, headache, chest pain, dyspnea, cough, vomiting or abnormal bleeding.  He does have intermittent abdominal and back pain as well as nausea. He continues to smoke.  Past Medical History:  Diagnosis Date  . Cancer ([place]) 12/2018   pancreatic cancer  . Depression   . Diabetes mellitus without complication ([place])    type 2  . Dyslipidemia   . Erectile dysfunction   . Family history of breast cancer   . Family history of melanoma   .  Hypertension    white coat syndrome  . Nicotine addiction    Past Surgical History:  Procedure Laterality Date  . BILIARY STENT PLACEMENT N/A 12/21/2018   Procedure: BILIARY STENT PLACEMENT;  Surgeon: Clarene Essex, MD;  [facility];  Service: Endoscopy;  Laterality: N/A;  . BILIARY STENT PLACEMENT N/A 01/24/2019   Procedure: BILIARY STENT PLACEMENT;  Surgeon: Arta Silence, MD;  [facility];  Service: Endoscopy;  Laterality: N/A;  . BIOPSY  12/21/2018   Procedure: BIOPSY;  Surgeon: Clarene Essex, MD;  [facility];  Service: Endoscopy;;  . ENDOSCOPIC RETROGRADE CHOLANGIOPANCREATOGRAPHY (ERCP) WITH PROPOFOL N/A 12/21/2018   Procedure: ENDOSCOPIC RETROGRADE CHOLANGIOPANCREATOGRAPHY (ERCP) WITH PROPOFOL;  Surgeon: Clarene Essex, MD;  [facility];  Service: Endoscopy;  Laterality: N/A;  . ENDOSCOPIC RETROGRADE CHOLANGIOPANCREATOGRAPHY (ERCP) WITH PROPOFOL N/A 01/24/2019   Procedure: ENDOSCOPIC RETROGRADE CHOLANGIOPANCREATOGRAPHY (ERCP) WITH PROPOFOL;  Surgeon: Arta Silence, MD;  [facility];  Service: Endoscopy;  Laterality: N/A;  Balloon Sweep of Duct  . ESOPHAGOGASTRODUODENOSCOPY N/A 01/03/2019   Procedure: ESOPHAGOGASTRODUODENOSCOPY (EGD);  Surgeon: Arta Silence, MD;  Location: [name];  Service: Endoscopy;  Laterality: N/A;  . ESOPHAGOGASTRODUODENOSCOPY (EGD) WITH PROPOFOL N/A 01/24/2019   Procedure: ESOPHAGOGASTRODUODENOSCOPY (EGD) WITH PROPOFOL;  Surgeon: Arta Silence, MD;  [facility];  Service: Endoscopy;  Laterality: N/A;  stent removal  . EUS N/A 01/03/2019   Procedure: FULL UPPER ENDOSCOPIC ULTRASOUND (EUS) Linear not radial;  Surgeon: Arta Silence, MD;  [facility];  Service: Endoscopy;  Laterality: N/A;  . EUS N/A 01/24/2019   Procedure: FULL UPPER ENDOSCOPIC ULTRASOUND (EUS) RADIAL;  Surgeon: Arta Silence, MD;  [facility];  Service: Endoscopy;  Laterality: N/A;  . FINE NEEDLE ASPIRATION N/A  01/03/2019   Procedure: FINE NEEDLE ASPIRATION (FNA) LINEAR;  Surgeon: Arta Silence, MD;  [facility];  Service: Endoscopy;  Laterality: N/A;  . FINE NEEDLE ASPIRATION N/A 01/24/2019   Procedure: FINE NEEDLE ASPIRATION (FNA) LINEAR;  Surgeon: Arta Silence, MD;  [facility];  Service: Endoscopy;  Laterality: N/A;  . IR RADIOLOGIST EVAL & MGMT  08/24/2019  . LAPAROSCOPY N/A 07/20/2019   Procedure: LAPAROSCOPY DIAGNOSTIC;  Surgeon: [name], MD;  Location: [facility];  Service: General;  Laterality: N/A;  . LOWER EXTREMITY ANGIOGRAPHY N/A 10/18/2017   Procedure: LOWER EXTREMITY ANGIOGRAPHY - IVUS - LYSIS CATH;  Surgeon: Waynetta Sandy, MD;  Location: [place] [facility];  Service: Cardiovascular;  Laterality: N/A;  . LOWER EXTREMITY ANGIOGRAPHY N/A 10/19/2017   Procedure: LOWER EXTREMITY ANGIOGRAPHY - LYSIS RECHECK;  Surgeon: Waynetta Sandy, MD;  Location: [place] [facility];  Service: Cardiovascular;  Laterality: N/A;  . PANCREATIC STENT PLACEMENT  12/21/2018   Procedure: PANCREATIC STENT PLACEMENT;  Surgeon: Clarene Essex, MD;  [facility];  Service: Endoscopy;;  . PEG PLACEMENT N/A 07/20/2019   Procedure: FEEDING TUBE;  Surgeon: [name], MD;  Location: [place];  Service: General;  Laterality: N/A;  . PERIPHERAL VASCULAR BALLOON ANGIOPLASTY Left 10/19/2017   Procedure: PERIPHERAL VASCULAR BALLOON ANGIOPLASTY;  Surgeon: Waynetta Sandy, MD;  Location: [place] [facility];  Service: Cardiovascular;  Laterality: Left;  Multiple Lower extremity venous sites  . PERIPHERAL VASCULAR INTERVENTION Left 10/18/2017   Procedure: PERIPHERAL VASCULAR INTERVENTION;  Surgeon: Waynetta Sandy, MD;  Location: [place] [facility];  Service: Cardiovascular;  Laterality: Left;  . PORTACATH PLACEMENT Left 02/02/2019   Procedure: INSERTION PORT-A-CATH WITH POSSIBLE ULTRASOUND;  Surgeon: [name], MD;  Location: [place];   Service: General;  Laterality: Left;  . REMOVAL OF STONES  12/21/2018   Procedure: REMOVAL OF STONES;  Surgeon: Clarene Essex, MD;  [facility];  Service: Endoscopy;;  . Joan Mayans  12/21/2018   Procedure: Joan Mayans;  Surgeon: Clarene Essex, MD;  [facility];  Service: Endoscopy;;  . STENT REMOVAL  01/24/2019   Procedure: STENT REMOVAL;  Surgeon: Arta Silence, MD;  [facility];  Service: Endoscopy;;  . WHIPPLE PROCEDURE N/A 07/20/2019   Procedure: WHIPPLE PROCEDURE;  Surgeon: [name], MD;  Location: [place];  Service: General;  Laterality: N/A;    Allergies: Patient has no known allergies.  Medications: Prior to Admission medications   Medication Sig Start Date End Date Taking? Authorizing Provider  acetaminophen (TYLENOL) 500 MG tablet Take 1,000 mg by mouth every 6 (six) hours as needed for moderate pain or headache.   Yes [provider]  b complex vitamins tablet Take 1 tablet by mouth daily.   Yes [provider]  CINNAMON PO Take 1,000 mg by mouth 2 (two) times daily.   Yes [provider]  Coenzyme Q10 (COQ-10 PO) Take 1 tablet by mouth daily.   Yes [provider]  ferrous sulfate 325 (65 FE) MG EC tablet Take 325 mg by mouth 2 (two) times a day.    Yes [provider]  glimepiride (AMARYL) 4 MG tablet Take 4 mg by mouth daily with breakfast.  10/13/17  Yes [provider]  HUMALOG KWIKPEN 100 UNIT/ML KwikPen Inject 3-13 Units into the skin 2 (two) times daily as needed (if bs is over 150).  06/29/19  Yes [provider]  Insulin Glargine (BASAGLAR KWIKPEN) 100 UNIT/ML SOPN Inject 19 Units into the skin at bedtime.  10/19/18  Yes [provider]  Lidocaine (HM LIDOCAINE PATCH) 4 % PTCH Apply 1 patch topically at bedtime.   Yes [provider]  lidocaine-prilocaine (EMLA) cream Apply 1 application topically as needed. Patient taking differently: Apply 1  application topically as needed (port access).  01/26/19  Yes Ladell Pier, MD  lipase/protease/amylase (CREON) 12000 units CPEP capsule Take 12,000 Units by mouth as directed. 2 tabs with each meal then 1 with each snack   Yes [provider]  lisinopril (PRINIVIL,ZESTRIL) 10 MG tablet Take 10 mg by mouth daily. 10/13/17  Yes [provider]  LORazepam (ATIVAN) 0.5 MG tablet Take 1 tablet (0.5 mg total) by mouth every 8 (eight) hours as needed (for nausea). 05/01/19  Yes Ladell Pier, MD  Melatonin 10 MG TABS Take 20 mg by mouth at bedtime.   Yes [provider]  metFORMIN (GLUCOPHAGE) 1000 MG tablet Take 1,000 mg by mouth 2 (two) times daily. 10/13/17  Yes [provider]  Omega-3 Fatty Acids (FISH OIL) 1000 MG CAPS Take 1,000 mg by mouth 2 (two) times daily.    Yes [provider]  ondansetron (ZOFRAN-ODT) 4 MG disintegrating tablet Take 1 tablet (4 mg total) by mouth every 6 (six) hours as needed for nausea. 07/28/19  Yes [name], MD  oxyCODONE (OXY IR/ROXICODONE) 5 MG immediate release tablet Take 1-2 tablets (5-10 mg total) by mouth every 4 (four) hours as needed for moderate pain, severe pain or breakthrough pain. 07/28/19  Yes [name], MD  pioglitazone (ACTOS) 45 MG tablet Take 45 mg by mouth daily. 10/13/17  Yes [provider]  saccharomyces boulardii (FLORASTOR) 250 MG capsule Take 1 capsule (250 mg total) by mouth 2 (two) times daily. 08/11/19  Yes Mercy Riding, MD  Amino Acids-Protein Hydrolys (FEEDING SUPPLEMENT, PRO-STAT SUGAR FREE 64,) LIQD Place 30 mLs into feeding tube 3 (three) times daily. 07/28/19   [name], MD  amoxicillin-clavulanate (AUGMENTIN) 875-125 MG tablet Take 1 tablet by mouth every 12 (twelve) hours. 08/11/19   Mercy Riding, MD  apixaban (ELIQUIS) 5 MG TABS tablet Take 1 tablet (5 mg total) by mouth 2 (two) times daily. 01/04/19   Arta Silence, MD  famotidine (PEPCID AC) 10 MG tablet Take 10 mg  by mouth 2 (two) times daily as needed for heartburn or indigestion.    [provider]  feeding supplement, ENSURE ENLIVE, (ENSURE ENLIVE) LIQD Take 237 mLs by mouth 3 (three) times daily between meals. 07/28/19   [name], MD  methocarbamol (ROBAXIN) 500 MG tablet Take 1 tablet (500 mg total) by mouth every 6 (six) hours as needed for muscle spasms. 07/28/19   [name], MD  prochlorperazine (COMPAZINE) 10 MG tablet Take 1 tablet (10 mg total) by mouth every 6 (six) hours as needed for nausea or vomiting. 04/14/19   Ladell Pier, MD     Vital Signs: BP 132/80   Pulse (!) 109   Temp 98.5 F (36.9 C) (Oral)   Resp 18   SpO2 100%   Physical Exam awake, alert.  Chest clear to auscultation bilaterally.  Left chest wall Port-A-Cath in place.  Heart with slightly tachycardic but regular rhythm.  Abdomen soft, positive bowel sounds, midline wound covered with clean gauze dressing, site mildly tender to palpation.  No lower extremity edema.  Imaging: No results found.  Labs:  CBC: Recent Labs    08/07/19 1526 08/08/19 0455  08/08/19 2045 08/09/19 0923 08/10/19 0500 08/11/19 0553  WBC 6.7 10.8*  --   --  8.6  --  6.7  HGB 9.1* 7.0*   < > 7.7* 7.9* 7.7* 8.7*  HCT 27.9* 22.1*   < > 24.1* 24.3* 24.0* 26.7*  PLT 193 139*  --   --  161  --  195   < > = values in this interval not displayed.    COAGS: Recent Labs    12/19/18 2153 12/20/18 0626 12/20/18 1935 12/22/18 0159 12/23/18 0014 07/12/19 0959 07/20/19 1610  INR 1.47 1.22  --   --   --  1.0 1.1  APTT  --  33 92* 80* 65*  --   --     BMP: Recent Labs    08/08/19 0455 08/09/19 0923 08/10/19 0500 08/11/19 0553  NA 135 133* 135 135  K 4.2 3.6 3.6 3.8  CL 106 101 104 101  CO2 21* 16* 24 23  GLUCOSE 163* 119* 106* 111*  BUN 24* 10 6 <5*  CALCIUM 7.2* 7.9* 7.9* 8.2*  CREATININE 1.25* 0.88 0.87 0.94  GFRNONAA >60 >60 >60 >60  GFRAA >60 >60 >60 >60    LIVER FUNCTION TESTS: Recent Labs     07/25/19 0500 07/27/19 0127 08/07/19 1526 08/09/19 0923  BILITOT 0.8 0.7 0.7 0.3  AST 24 86* 27 18  ALT 24 74* 23 19  ALKPHOS 190* 380* 262* 210*  PROT 5.2* 5.3* 6.3* 6.0*  ALBUMIN 2.4* 2.2* 2.6* 2.2*    Assessment and Plan: Pt with history of pancreatic carcinoma ; status post  Whipple procedure on 07/20/19.  MRI of abdomen performed on 09/05/2019 revealed:  1. Re-accumulation of fluid adjacent to the liver following drain removal with other areas of fluid collection adjacent to liver with somewhat distant to biliary and pancreatic limbs. The more midline of these areas which likely was along the tract of a previous surgical drain extends toward the midline abdominal wound. Biliary or pancreatic leak are both considered. Based on the signal of the perihepatic collection of small amount of hematoma in this area is also a possibility. 2. Signs of numerous hepatic lesions less than a cm with some peripheral enhancement and restricted diffusion, concerning for metastatic disease. However, given biliary enhancement in the setting of recent Whipple consider a possibility of micro abscesses in the setting of cholangitis. Ultrasound guided sampling may be helpful for further evaluation. 3. Signs of generalized bowel edema more pronounced in the biliary pancreatic limb  Request now received for image guided liver lesion biopsy for further evaluation.Risks and benefits of procedure was discussed with the patient  including, but not limited to bleeding, infection, damage to adjacent structures or low yield requiring additional tests.  All of the questions were answered and there is agreement to proceed.  Consent signed and in chart.  LABS PENDING   Electronically Signed: D. Rowe Robert, PA-C 09/26/2019, 11:58 AM   I spent a total of 25 minutes at the the patient's bedside AND on the patient's hospital floor or unit, greater than 50% of which was counseling/coordinating care for  image guided liver lesion biopsy

## 2019-09-27 ENCOUNTER — Telehealth: Payer: Self-pay | Admitting: Oncology

## 2019-09-27 LAB — SURGICAL PATHOLOGY

## 2019-09-27 NOTE — Telephone Encounter (Signed)
Scheduled appt per 11/25 sch message - unable to reach pt  , left message with appt date and time

## 2019-10-02 ENCOUNTER — Inpatient Hospital Stay (HOSPITAL_BASED_OUTPATIENT_CLINIC_OR_DEPARTMENT_OTHER): Payer: Managed Care, Other (non HMO) | Admitting: Oncology

## 2019-10-02 ENCOUNTER — Encounter: Payer: Self-pay | Admitting: *Deleted

## 2019-10-02 ENCOUNTER — Other Ambulatory Visit: Payer: Self-pay

## 2019-10-02 VITALS — BP 123/78 | HR 102 | Temp 98.0°F | Resp 17 | Ht 76.0 in | Wt 183.7 lb

## 2019-10-02 DIAGNOSIS — Z7189 Other specified counseling: Secondary | ICD-10-CM | POA: Diagnosis not present

## 2019-10-02 DIAGNOSIS — C250 Malignant neoplasm of head of pancreas: Secondary | ICD-10-CM | POA: Diagnosis not present

## 2019-10-02 MED ORDER — OXYCODONE HCL 5 MG PO TABS: 5.0000 mg | ORAL_TABLET | Freq: Four times a day (QID) | ORAL | 0 refills | Status: AC | PRN

## 2019-10-02 MED ORDER — ONDANSETRON 4 MG PO TBDP: 4.0000 mg | ORAL_TABLET | Freq: Four times a day (QID) | ORAL | 0 refills | Status: DC | PRN

## 2019-10-02 NOTE — Progress Notes (Signed)
[place] OFFICE PROGRESS NOTE   Diagnosis: Pancreas cancer  INTERVAL HISTORY:   [name] returns for a scheduled visit.  He underwent an ultrasound-guided biopsy of a right liver lesion liver lesion on 09/26/2019.  The pathology confirmed adenocarcinoma consistent with metastatic pancreas cancer.  I discussed the pathology result with him by telephone on 09/27/2019.  He is here today with Brian Torres.  He reports frequent belching.  No nausea or vomiting.  He has a diminished energy level.  He complains of back pain.  The pain is intermittently in the upper and lower back.  The pain makes it difficult for him to sleep.  He takes oxycodone at night.  Over-the-counter medications have not helped the pain.  He has noted mild tingling in the fingers since undergoing a Whipple procedure.  No other neurologic symptoms.  No fever.  The abdominal wound is healing.  Hold nurses dress the wound daily.  Objective:  Vital signs in last 24 hours:  Blood pressure 123/78, pulse (!) 102, temperature 98 F (36.7 C), temperature source Temporal, resp. rate 17, height 6\' 4"  (1.93 m), weight 183 lb 11.2 oz (83.3 kg), SpO2 100 %.    Limited physical examination secondary to distancing with the Covid pandemic Musculoskeletal: No spine tenderness.   Lab Results:  Lab Results  Component Value Date   WBC 8.1 09/26/2019   HGB 9.6 (L) 09/26/2019   HCT 29.0 (L) 09/26/2019   MCV 87.6 09/26/2019   PLT 225 09/26/2019   NEUTROABS 6.5 08/07/2019    CMP  Lab Results  Component Value Date   NA 135 08/11/2019   K 3.8 08/11/2019   CL 101 08/11/2019   CO2 23 08/11/2019   GLUCOSE 111 (H) 08/11/2019   BUN <5 (L) 08/11/2019   CREATININE 0.94 08/11/2019   CALCIUM 8.2 (L) 08/11/2019   PROT 6.0 (L) 08/09/2019   ALBUMIN 2.2 (L) 08/09/2019   AST 18 08/09/2019   ALT 19 08/09/2019   ALKPHOS 210 (H) 08/09/2019   BILITOT 0.3 08/09/2019   GFRNONAA >60 08/11/2019   GFRAA >60 08/11/2019      Medications: I have reviewed the patient's current medications.   Assessment/Plan: 1. Pancreas head mass  Presented with jaundice, bilirubin elevated at 21.4  CTs abdomen/pelvis 12/20/2018-severe intrahepatic and extrahepatic biliary dilatation due to a possible 3.5 x 2.5 cm solid mass in the pancreatic head. Pancreatic ductal dilatation noted as well with multiple cystic lesions in the pancreatic tail and body.   CT chest 12/20/2018-occasional nonspecific small pulmonary nodules felt to likely be related to prior infection or inflammation.   ERCP by Dr. Watt Torres 12/21/2018. The major papilla was on the rim of a diverticulum. The minor papilla appeared to be bulging. A biopsy of the duodenum adjacent to the minor papilla was performed. Plastic stent was placed into the ventral pancreatic duct. Biliary sphincterotomy performed. Covered metal stent placed into the common bile duct. Bile duct brushings showed benign reactive/reparative changes. Biopsy of polypoid duodenal mucosa suggestive of nodular peptic duodenitis, negative for dysplasia or malignancy.   Upper EUS by Dr. Paulita Torres on 01/03/2019 showed a few cystic lesions in the pancreatic body and pancreatic tail. A stent was visualized in the common bile duct. Mass identified in the pancreatic head with fine-needle aspiration performed. Lesion appeared to abut or potentially superficially invade the superior mesenteric vein. No lymphadenopathy. No involvement of the superior mesenteric artery or celiac artery. Pathology returned suspicious for malignancy. Dr. Paulita Torres notes if pathology  shows adenocarcinoma the lesion would be staged T3 N0 MX.  Repeat ERCP and placement of a metal bile duct stent 01/24/2019  EUS 01/24/2019, T3N0 pancreas head mass with abutment of the SMV, FNA of the pancreas mass revealed adenocarcinoma, cytology from the bile duct stent revealed adenocarcinoma  Cycle 1 FOLFIRINOX4/04/2019  Cycle 2  FOLFIRINOX4/20/2020, Udenyca added  Cycle 3 FOLFIRINOX 03/09/2019, Irinotecan and 5-fluorouracil dose reduced, Udenyca held  Cycle 4 FOLFIRINOX 03/21/2019-Udenyca held  Cycle 5 FOLFIRINOX6/01/2019  Cycle 6 FOLFIRINOX 04/24/2019 (oxaliplatin dose reduced due to poor tolerance of chemotherapy)  CT 05/11/2019-decreased size of pancreas head mass, unchanged peripancreatic lymph node, no evidence of metastatic disease, improved/resolved fluid collections at the pancreas tail  Cycle 7 FOLFIRINOX 05/22/2019  Cycle 8 FOLFIRINOX 06/06/2019  07/20/2019 pancreaticoduodenectomy 07/20/2019-pT2, pN0 (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  Treatment effect absent, extensive residual cancer with no evident tumor regression)  MRI abdomen 09/05/2019-reaccumulation of fluid along the perihepatic margin following presumed removal of the pigtail drainage catheter that was present in this area on the study of 08/24/2019; some fluid in the region of the porta hepatis and pancreaticobiliary limb; small collection of fluid just inferior to the interlobar fissure in the liver along the course of the previous drain; tract extending from this area to the midline abdominal wound; innumerable areas of abnormality seen on T2 and diffusion throughout the liver, largest in the range of 1 cm.  All areas of the liver affected, both right and left hepatic lobes.  Along the gallbladder fossa is a peripherally enhancing 1.8 x 1.3 cm focus.  Ultrasound-guided biopsy of a liver lesion 09/26/2019-adenocarcinoma compatible with pancreatic adenocarcinoma 2. Biliary obstruction status post stent placement 3. History of left lower extremity DVT 2017, 2018-on Brian Torres. 4. Type 2 diabetes 5. Hypertension 6.  Chronic kidney disease 7. Tobacco use 8. Anemia, macrocytic. Transfused 2 units of blood 12/20/2018.On oral iron. Improved 02/01/2019 and 02/06/2019. 9. Port-A-Cath placement 02/02/2019, Dr. Barry Torres 10. 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 Hospitalized 08/07/2019 through 08/11/2019 with postsurgical intra-abdominal fluid collection.  CT 08/07/2019 with 2 rim-enhancing fluid collections in the right upper quadrant concerning for abscesses, moderate wall thickening and pericolonic inflammatory changes involving the distal ascending, transverse and descending colon consistent with colitis.  Blood culture 08/07/2019 + for Prevotella buccae beta-lactamase positive; status post CT-guided aspiration of intra-abdominal fluid collection and drain placement on 08/09/2019, culture with multiple organisms present, none predominant; follow-up CT 08/24/2019 with resolution of dominant fluid collection within the right upper abdominal quadrant, tiny serpiginous fluid collection within the ventral aspect of the right upper abdomen, interval development of multiple ill-defined hypoattenuating liver lesions; MRI abdomen 09/05/2019 reaccumulation of fluid along the perihepatic margin following presumed removal of the pigtail drainage catheter that was present in this area on the study of 08/24/2019; some fluid in the region of the porta hepatis and pancreaticobiliary limb; small collection of fluid just inferior to the interlobar fissure in the liver along the course of the previous drain; tract extending from this area to the midline abdominal wound; innumerable areas of abnormality seen on T2 and diffusion throughout the liver, largest in the range of 1 cm.  All areas of the liver affected, both right and left hepatic lobes.  Along the gallbladder fossa is a peripherally  enhancing 1.8 x 1.3 cm focus.     Disposition: Brian Torres has been diagnosed  with metastatic pancreas cancer.  I discussed the prognosis and treatment options with Brian Torres and Brian Torres.  He understands no therapy will be curative.  We discussed standard salvage treatment options.  We submitted the liver biopsy tissue for NGS testing.  Brian Torres would like to begin treatment as soon as possible.  I recommend gemcitabine/Abraxane.  We reviewed potential toxicities associated with this regimen including the chance for nausea, alopecia, and hematologic toxicity.  We discussed the rash and pneumonitis associated with gemcitabine.  We discussed the allergic reaction and neuropathy seen with Abraxane.  He agrees to proceed.  He would like to pursue a second opinion.  I will make referral to the GI oncology service at [facility] to discuss standard and clinical trial options.   He will return for an office and lab visit with the plan to begin gemcitabine/Abraxane on 10/09/2019.  He will call for increased back pain.  The back pain may be related to a benign musculoskeletal condition, but it is possible the pain is secondary to metastatic pancreas cancer.  We will schedule imaging of the spine if he has persistent/progressive pain.  45 minutes were spent with the patient today.  The majority of the time was used for counseling and coordination of care.  Betsy Coder, MD  10/02/2019  2:53 PM

## 2019-10-02 NOTE — Progress Notes (Signed)
At request of Dr.Sherrill, sent email to [facility] to request [facility] Testing on liver biopsy case #[id number] dated 09/26/19. Dx: C25.0 Stage IV

## 2019-10-02 NOTE — Progress Notes (Signed)
DISCONTINUE ON PATHWAY REGIMEN - Pancreatic Adenocarcinoma     A cycle is every 14 days:     Oxaliplatin      Leucovorin      Irinotecan      5-Fluorouracil   **Always confirm dose/schedule in your pharmacy ordering system**  REASON: Disease Progression PRIOR TREATMENT: PANOS94: mFOLFIRINOX q14 Days x 4 Cycles TREATMENT RESPONSE: Stable Disease (SD)  START ON PATHWAY REGIMEN - Pancreatic Adenocarcinoma     A cycle is every 28 days:     Nab-paclitaxel (protein bound)      Gemcitabine   **Always confirm dose/schedule in your pharmacy ordering system**  Patient Characteristics: Metastatic Disease, First Line, PS = 0,1, BRCA1/2 and PALB2  Mutation Absent/Unknown Current evidence of distant metastases<= Yes AJCC T Category: Staged < 8th Ed. AJCC N Category: Staged < 8th Ed. AJCC M Category: Staged < 8th Ed. AJCC 8 Stage Grouping: Staged < 8th Ed. Line of Therapy: First Engineer, maintenance (IT) Status: 1 BRCA1/2 Mutation Status: Absent PALB2 Mutation Status: Awaiting Test Results Intent of Therapy: Non-Curative / Palliative Intent, Discussed with Patient

## 2019-10-03 ENCOUNTER — Other Ambulatory Visit: Payer: Self-pay | Admitting: Oncology

## 2019-10-03 DIAGNOSIS — C250 Malignant neoplasm of head of pancreas: Secondary | ICD-10-CM

## 2019-10-05 ENCOUNTER — Telehealth: Payer: Self-pay | Admitting: *Deleted

## 2019-10-05 NOTE — Telephone Encounter (Signed)
Referral faxed to [facility] - att referrals for Dr. Burt Ek - release [phone]

## 2019-10-08 ENCOUNTER — Other Ambulatory Visit: Payer: Self-pay | Admitting: Oncology

## 2019-10-09 ENCOUNTER — Other Ambulatory Visit: Payer: Self-pay

## 2019-10-09 ENCOUNTER — Inpatient Hospital Stay: Payer: Managed Care, Other (non HMO)

## 2019-10-09 ENCOUNTER — Inpatient Hospital Stay: Payer: Managed Care, Other (non HMO) | Attending: Oncology | Admitting: Oncology

## 2019-10-09 ENCOUNTER — Telehealth: Payer: Self-pay | Admitting: Oncology

## 2019-10-09 VITALS — BP 122/78 | HR 125 | Temp 99.1°F | Resp 19 | Ht 76.0 in | Wt 182.3 lb

## 2019-10-09 DIAGNOSIS — G620 Drug-induced polyneuropathy: Secondary | ICD-10-CM | POA: Insufficient documentation

## 2019-10-09 DIAGNOSIS — N189 Chronic kidney disease, unspecified: Secondary | ICD-10-CM | POA: Insufficient documentation

## 2019-10-09 DIAGNOSIS — E1122 Type 2 diabetes mellitus with diabetic chronic kidney disease: Secondary | ICD-10-CM | POA: Insufficient documentation

## 2019-10-09 DIAGNOSIS — Z7901 Long term (current) use of anticoagulants: Secondary | ICD-10-CM | POA: Insufficient documentation

## 2019-10-09 DIAGNOSIS — Z5111 Encounter for antineoplastic chemotherapy: Secondary | ICD-10-CM | POA: Insufficient documentation

## 2019-10-09 DIAGNOSIS — C250 Malignant neoplasm of head of pancreas: Secondary | ICD-10-CM | POA: Insufficient documentation

## 2019-10-09 DIAGNOSIS — Z86718 Personal history of other venous thrombosis and embolism: Secondary | ICD-10-CM | POA: Insufficient documentation

## 2019-10-09 DIAGNOSIS — R509 Fever, unspecified: Secondary | ICD-10-CM | POA: Diagnosis not present

## 2019-10-09 DIAGNOSIS — F17210 Nicotine dependence, cigarettes, uncomplicated: Secondary | ICD-10-CM | POA: Insufficient documentation

## 2019-10-09 DIAGNOSIS — A408 Other streptococcal sepsis: Secondary | ICD-10-CM | POA: Diagnosis not present

## 2019-10-09 DIAGNOSIS — C787 Secondary malignant neoplasm of liver and intrahepatic bile duct: Secondary | ICD-10-CM | POA: Insufficient documentation

## 2019-10-09 DIAGNOSIS — D649 Anemia, unspecified: Secondary | ICD-10-CM | POA: Insufficient documentation

## 2019-10-09 DIAGNOSIS — Z794 Long term (current) use of insulin: Secondary | ICD-10-CM | POA: Insufficient documentation

## 2019-10-09 DIAGNOSIS — I129 Hypertensive chronic kidney disease with stage 1 through stage 4 chronic kidney disease, or unspecified chronic kidney disease: Secondary | ICD-10-CM | POA: Insufficient documentation

## 2019-10-09 DIAGNOSIS — Z95828 Presence of other vascular implants and grafts: Secondary | ICD-10-CM

## 2019-10-09 LAB — CBC WITH DIFFERENTIAL (CANCER CENTER ONLY)
Abs Immature Granulocytes: 0.1 10*3/uL — ABNORMAL HIGH (ref 0.00–0.07)
Basophils Absolute: 0 10*3/uL (ref 0.0–0.1)
Basophils Relative: 0 %
Eosinophils Absolute: 0.2 10*3/uL (ref 0.0–0.5)
Eosinophils Relative: 2 %
HCT: 26.6 % — ABNORMAL LOW (ref 39.0–52.0)
Hemoglobin: 8.8 g/dL — ABNORMAL LOW (ref 13.0–17.0)
Immature Granulocytes: 1 %
Lymphocytes Relative: 7 %
Lymphs Abs: 0.9 10*3/uL (ref 0.7–4.0)
MCH: 28.9 pg (ref 26.0–34.0)
MCHC: 33.1 g/dL (ref 30.0–36.0)
MCV: 87.2 fL (ref 80.0–100.0)
Monocytes Absolute: 1.3 10*3/uL — ABNORMAL HIGH (ref 0.1–1.0)
Monocytes Relative: 10 %
Neutro Abs: 10.5 10*3/uL — ABNORMAL HIGH (ref 1.7–7.7)
Neutrophils Relative %: 80 %
Platelet Count: 268 10*3/uL (ref 150–400)
RBC: 3.05 MIL/uL — ABNORMAL LOW (ref 4.22–5.81)
RDW: 18 % — ABNORMAL HIGH (ref 11.5–15.5)
WBC Count: 13 10*3/uL — ABNORMAL HIGH (ref 4.0–10.5)
nRBC: 0 % (ref 0.0–0.2)

## 2019-10-09 LAB — CMP (CANCER CENTER ONLY)
ALT: 81 U/L — ABNORMAL HIGH (ref 0–44)
AST: 120 U/L — ABNORMAL HIGH (ref 15–41)
Albumin: 2.2 g/dL — ABNORMAL LOW (ref 3.5–5.0)
Alkaline Phosphatase: 1292 U/L — ABNORMAL HIGH (ref 38–126)
Anion gap: 10 (ref 5–15)
BUN: 21 mg/dL — ABNORMAL HIGH (ref 6–20)
CO2: 25 mmol/L (ref 22–32)
Calcium: 8.7 mg/dL — ABNORMAL LOW (ref 8.9–10.3)
Chloride: 96 mmol/L — ABNORMAL LOW (ref 98–111)
Creatinine: 1.09 mg/dL (ref 0.61–1.24)
GFR, Est AFR Am: >60 mL/min (ref >60)
GFR, Estimated: >60 mL/min (ref >60)
Glucose, Bld: 245 mg/dL — ABNORMAL HIGH (ref 70–99)
Potassium: 5.1 mmol/L (ref 3.5–5.1)
Sodium: 131 mmol/L — ABNORMAL LOW (ref 135–145)
Total Bilirubin: 5 mg/dL (ref 0.3–1.2)
Total Protein: 7.3 g/dL (ref 6.5–8.1)

## 2019-10-09 MED ORDER — SODIUM CHLORIDE 0.9% FLUSH
10.0000 mL | INTRAVENOUS | Status: DC | PRN
  Administered 2019-10-09: 10 mL
  Filled 2019-10-09: qty 10

## 2019-10-09 MED ORDER — LIDOCAINE-PRILOCAINE 2.5-2.5 % EX CREA: 1.0000 "application " | TOPICAL_CREAM | CUTANEOUS | 1 refills | Status: AC | PRN

## 2019-10-09 NOTE — Telephone Encounter (Signed)
I talk with patient regarding schedule for 12/9

## 2019-10-09 NOTE — Progress Notes (Signed)
Dr. Benay Spice notified of Bili  5.0 today.

## 2019-10-09 NOTE — Progress Notes (Signed)
[place] OFFICE PROGRESS NOTE   Diagnosis: Pancreas cancer  INTERVAL HISTORY:   Brian Torres returns for a scheduled visit.  He reports a good appetite and adequate fluid intake.  He continues to have intermittent back pain.  No new complaint.  No fever.  Objective:  Vital signs in last 24 hours:  Blood pressure 122/78, pulse (!) 125, temperature 99.1 F (37.3 C), temperature source Temporal, resp. rate 19, height 6\' 4"  (1.93 m), weight 182 lb 4.8 oz (82.7 kg), SpO2 98 %. Resp: Lungs clear bilaterally Cardio: Regular rate and rhythm, tachycardia GI: The liver is palpable in the right upper abdomen Vascular: No leg edema  Skin: The midline wound has almost completely healed with a 1-2 cm superficial opening at the upper aspect of the wound.  No surrounding erythema.  Portacath/PICC-without erythema  Lab Results:  Lab Results  Component Value Date   WBC 13.0 (H) 10/09/2019   HGB 8.8 (L) 10/09/2019   HCT 26.6 (L) 10/09/2019   MCV 87.2 10/09/2019   PLT 268 10/09/2019   NEUTROABS 10.5 (H) 10/09/2019    CMP  Lab Results  Component Value Date   NA 131 (L) 10/09/2019   K 5.1 10/09/2019   CL 96 (L) 10/09/2019   CO2 25 10/09/2019   GLUCOSE 245 (H) 10/09/2019   BUN 21 (H) 10/09/2019   CREATININE 1.09 10/09/2019   CALCIUM 8.7 (L) 10/09/2019   PROT 7.3 10/09/2019   ALBUMIN 2.2 (L) 10/09/2019   AST 120 (H) 10/09/2019   ALT 81 (H) 10/09/2019   ALKPHOS 1,292 (H) 10/09/2019   BILITOT 5.0 (HH) 10/09/2019   GFRNONAA >60 10/09/2019   GFRAA >60 10/09/2019      Medications: I have reviewed the patient's current medications.   Assessment/Plan: 1. Pancreas head mass  Presented with jaundice, bilirubin elevated at 21.4  CTs abdomen/pelvis 12/20/2018-severe intrahepatic and extrahepatic biliary dilatation due to a possible 3.5 x 2.5 cm solid mass in the pancreatic head. Pancreatic ductal dilatation noted as well with multiple cystic lesions in the pancreatic  tail and body.   CT chest 12/20/2018-occasional nonspecific small pulmonary nodules felt to likely be related to prior infection or inflammation.   ERCP by Dr. Watt Climes 12/21/2018. The major papilla was on the rim of a diverticulum. The minor papilla appeared to be bulging. A biopsy of the duodenum adjacent to the minor papilla was performed. Plastic stent was placed into the ventral pancreatic duct. Biliary sphincterotomy performed. Covered metal stent placed into the common bile duct. Bile duct brushings showed benign reactive/reparative changes. Biopsy of polypoid duodenal mucosa suggestive of nodular peptic duodenitis, negative for dysplasia or malignancy.   Upper EUS by Dr. Paulita Fujita on 01/03/2019 showed a few cystic lesions in the pancreatic body and pancreatic tail. A stent was visualized in the common bile duct. Mass identified in the pancreatic head with fine-needle aspiration performed. Lesion appeared to abut or potentially superficially invade the superior mesenteric vein. No lymphadenopathy. No involvement of the superior mesenteric artery or celiac artery. Pathology returned suspicious for malignancy. Dr. Paulita Fujita notes if pathology shows adenocarcinoma the lesion would be staged T3 N0 MX.  Repeat ERCP and placement of a metal bile duct stent 01/24/2019  EUS 01/24/2019, T3N0 pancreas head mass with abutment of the SMV, FNA of the pancreas mass revealed adenocarcinoma, cytology from the bile duct stent revealed adenocarcinoma  Cycle 1 FOLFIRINOX4/04/2019  Cycle 2 FOLFIRINOX4/20/2020, Udenyca added  Cycle 3 FOLFIRINOX 03/09/2019, Irinotecan and 5-fluorouracil dose reduced, Udenyca held  Cycle 4 FOLFIRINOX 03/21/2019-Udenyca held  Cycle 5 FOLFIRINOX6/01/2019  Cycle 6 FOLFIRINOX 04/24/2019 (oxaliplatin dose reduced due to poor tolerance of chemotherapy)  CT 05/11/2019-decreased size of pancreas head mass, unchanged peripancreatic lymph node, no evidence of metastatic disease,  improved/resolved fluid collections at the pancreas tail  Cycle 7 FOLFIRINOX 05/22/2019  Cycle 8 FOLFIRINOX 06/06/2019  07/20/2019 pancreaticoduodenectomy 07/20/2019-pT2, pN0 (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  Treatment effect absent, extensive residual cancer with no evident tumor regression)  MRI abdomen 09/05/2019-reaccumulation of fluid along the perihepatic margin following presumed removal of the pigtail drainage catheter that was present in this area on the study of 08/24/2019; some fluid in the region of the porta hepatis and pancreaticobiliary limb; small collection of fluid just inferior to the interlobar fissure in the liver along the course of the previous drain; tract extending from this area to the midline abdominal wound; innumerable areas of abnormality seen on T2 and diffusion throughout the liver, largest in the range of 1 cm.  All areas of the liver affected, both right and left hepatic lobes.  Along the gallbladder fossa is a peripherally enhancing 1.8 x 1.3 cm focus.  Ultrasound-guided biopsy of a liver lesion 09/26/2019-adenocarcinoma compatible with pancreatic adenocarcinoma 2. Biliary obstruction status post stent placement 3. History of left lower extremity DVT 2017, 2018-on Eliquis. 4. Type 2 diabetes 5. Hypertension 6. Chronic kidney disease 7. Tobacco use 8. Anemia, macrocytic. Transfused 2 units of blood 12/20/2018.On oral iron. Improved 02/01/2019 and 02/06/2019. 9. Port-A-Cath placement 02/02/2019, Dr. Barry Dienes 10. 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 Hospitalized 08/07/2019 through 08/11/2019 with postsurgical intra-abdominal fluid collection.  CT  08/07/2019 with 2 rim-enhancing fluid collections in the right upper quadrant concerning for abscesses, moderate wall thickening and pericolonic inflammatory changes involving the distal ascending, transverse and descending colon consistent with colitis.  Blood culture 08/07/2019 + for Prevotella buccae beta-lactamase positive; status post CT-guided aspiration of intra-abdominal fluid collection and drain placement on 08/09/2019, culture with multiple organisms present, none predominant; follow-up CT 08/24/2019 with resolution of dominant fluid collection within the right upper abdominal quadrant, tiny serpiginous fluid collection within the ventral aspect of the right upper abdomen, interval development of multiple ill-defined hypoattenuating liver lesions; MRI abdomen 09/05/2019 reaccumulation of fluid along the perihepatic margin following presumed removal of the pigtail drainage catheter that was present in this area on the study of 08/24/2019; some fluid in the region of the porta hepatis and pancreaticobiliary limb; small collection of fluid just inferior to the interlobar fissure in the liver along the course of the previous drain; tract extending from this area to the midline abdominal wound; innumerable areas of abnormality seen on T2 and diffusion throughout the liver, largest in the range of 1 cm.  All areas of the liver affected, both right and left hepatic lobes.  Along the gallbladder fossa is a peripherally enhancing 1.8 x 1.3 cm focus.     Disposition: Brian Torres has metastatic pancreas cancer.  He is scheduled to begin gemcitabine/Abraxane chemotherapy today, but the bilirubin is markedly elevated.  This is likely secondary to intrahepatic disease progression.  We will place chemotherapy on hold today.  He will be referred for an urgent CT of the abdomen to look for evidence of biliary obstruction that may be amenable  to placement of a drain.  He will return for an office visit on 10/11/2019.   He is scheduled to see Dr. Reynaldo Minium for a second opinion.  NGS testing on the liver biopsy is pending.  25 minutes were spent with the patient today.  The majority of the time was used for counseling and coordination of care.  Betsy Coder, MD  10/09/2019  2:02 PM

## 2019-10-10 ENCOUNTER — Ambulatory Visit (HOSPITAL_COMMUNITY)
Admission: RE | Admit: 2019-10-10 | Discharge: 2019-10-10 | Disposition: A | Discharge status: Home / Self Care | Payer: Managed Care, Other (non HMO) | Source: Ambulatory Visit | Attending: Oncology | Admitting: Oncology

## 2019-10-10 DIAGNOSIS — C250 Malignant neoplasm of head of pancreas: Secondary | ICD-10-CM | POA: Insufficient documentation

## 2019-10-10 LAB — CANCER ANTIGEN 19-9: CA 19-9: 401200 U/mL — ABNORMAL HIGH (ref 0–35)

## 2019-10-10 MED ORDER — IOHEXOL 300 MG/ML  SOLN
100.0000 mL | Freq: Once | INTRAMUSCULAR | Status: AC | PRN
  Administered 2019-10-10: 100 mL via INTRAVENOUS

## 2019-10-11 ENCOUNTER — Inpatient Hospital Stay: Payer: Managed Care, Other (non HMO)

## 2019-10-11 ENCOUNTER — Encounter (HOSPITAL_COMMUNITY): Payer: Self-pay

## 2019-10-11 ENCOUNTER — Emergency Department (HOSPITAL_COMMUNITY): Payer: Managed Care, Other (non HMO)

## 2019-10-11 ENCOUNTER — Other Ambulatory Visit: Payer: Self-pay

## 2019-10-11 ENCOUNTER — Inpatient Hospital Stay: Payer: Managed Care, Other (non HMO) | Admitting: Oncology

## 2019-10-11 ENCOUNTER — Inpatient Hospital Stay (HOSPITAL_COMMUNITY)
Admission: EM | Admit: 2019-10-11 | Discharge: 2019-10-16 | DRG: 871 | Disposition: A | Discharge status: Home / Self Care | Payer: Managed Care, Other (non HMO) | Attending: Internal Medicine | Admitting: Internal Medicine

## 2019-10-11 DIAGNOSIS — T451X5A Adverse effect of antineoplastic and immunosuppressive drugs, initial encounter: Secondary | ICD-10-CM | POA: Diagnosis present

## 2019-10-11 DIAGNOSIS — R17 Unspecified jaundice: Secondary | ICD-10-CM

## 2019-10-11 DIAGNOSIS — N183 Chronic kidney disease, stage 3 unspecified: Secondary | ICD-10-CM | POA: Diagnosis present

## 2019-10-11 DIAGNOSIS — D689 Coagulation defect, unspecified: Secondary | ICD-10-CM | POA: Diagnosis present

## 2019-10-11 DIAGNOSIS — Z833 Family history of diabetes mellitus: Secondary | ICD-10-CM

## 2019-10-11 DIAGNOSIS — Z808 Family history of malignant neoplasm of other organs or systems: Secondary | ICD-10-CM

## 2019-10-11 DIAGNOSIS — E785 Hyperlipidemia, unspecified: Secondary | ICD-10-CM | POA: Diagnosis present

## 2019-10-11 DIAGNOSIS — Z9049 Acquired absence of other specified parts of digestive tract: Secondary | ICD-10-CM

## 2019-10-11 DIAGNOSIS — B955 Unspecified streptococcus as the cause of diseases classified elsewhere: Secondary | ICD-10-CM | POA: Diagnosis not present

## 2019-10-11 DIAGNOSIS — E1141 Type 2 diabetes mellitus with diabetic mononeuropathy: Secondary | ICD-10-CM | POA: Diagnosis present

## 2019-10-11 DIAGNOSIS — F329 Major depressive disorder, single episode, unspecified: Secondary | ICD-10-CM | POA: Diagnosis present

## 2019-10-11 DIAGNOSIS — E1122 Type 2 diabetes mellitus with diabetic chronic kidney disease: Secondary | ICD-10-CM | POA: Diagnosis present

## 2019-10-11 DIAGNOSIS — A408 Other streptococcal sepsis: Secondary | ICD-10-CM | POA: Diagnosis present

## 2019-10-11 DIAGNOSIS — Z20828 Contact with and (suspected) exposure to other viral communicable diseases: Secondary | ICD-10-CM | POA: Diagnosis present

## 2019-10-11 DIAGNOSIS — D630 Anemia in neoplastic disease: Secondary | ICD-10-CM | POA: Diagnosis present

## 2019-10-11 DIAGNOSIS — D62 Acute posthemorrhagic anemia: Secondary | ICD-10-CM | POA: Diagnosis present

## 2019-10-11 DIAGNOSIS — K750 Abscess of liver: Secondary | ICD-10-CM | POA: Diagnosis present

## 2019-10-11 DIAGNOSIS — I129 Hypertensive chronic kidney disease with stage 1 through stage 4 chronic kidney disease, or unspecified chronic kidney disease: Secondary | ICD-10-CM | POA: Diagnosis present

## 2019-10-11 DIAGNOSIS — R7881 Bacteremia: Secondary | ICD-10-CM | POA: Diagnosis not present

## 2019-10-11 DIAGNOSIS — C787 Secondary malignant neoplasm of liver and intrahepatic bile duct: Secondary | ICD-10-CM | POA: Diagnosis present

## 2019-10-11 DIAGNOSIS — K811 Chronic cholecystitis: Secondary | ICD-10-CM | POA: Diagnosis present

## 2019-10-11 DIAGNOSIS — G620 Drug-induced polyneuropathy: Secondary | ICD-10-CM | POA: Diagnosis present

## 2019-10-11 DIAGNOSIS — C250 Malignant neoplasm of head of pancreas: Secondary | ICD-10-CM | POA: Diagnosis present

## 2019-10-11 DIAGNOSIS — M21371 Foot drop, right foot: Secondary | ICD-10-CM | POA: Diagnosis present

## 2019-10-11 DIAGNOSIS — N529 Male erectile dysfunction, unspecified: Secondary | ICD-10-CM | POA: Diagnosis present

## 2019-10-11 DIAGNOSIS — C799 Secondary malignant neoplasm of unspecified site: Secondary | ICD-10-CM | POA: Diagnosis not present

## 2019-10-11 DIAGNOSIS — Z87898 Personal history of other specified conditions: Secondary | ICD-10-CM

## 2019-10-11 DIAGNOSIS — G5781 Other specified mononeuropathies of right lower limb: Secondary | ICD-10-CM | POA: Diagnosis present

## 2019-10-11 DIAGNOSIS — N179 Acute kidney failure, unspecified: Secondary | ICD-10-CM | POA: Diagnosis present

## 2019-10-11 DIAGNOSIS — F17210 Nicotine dependence, cigarettes, uncomplicated: Secondary | ICD-10-CM | POA: Diagnosis present

## 2019-10-11 DIAGNOSIS — Z7901 Long term (current) use of anticoagulants: Secondary | ICD-10-CM

## 2019-10-11 DIAGNOSIS — R509 Fever, unspecified: Secondary | ICD-10-CM | POA: Diagnosis present

## 2019-10-11 DIAGNOSIS — Z86718 Personal history of other venous thrombosis and embolism: Secondary | ICD-10-CM

## 2019-10-11 DIAGNOSIS — Z90411 Acquired partial absence of pancreas: Secondary | ICD-10-CM

## 2019-10-11 DIAGNOSIS — Z79899 Other long term (current) drug therapy: Secondary | ICD-10-CM

## 2019-10-11 DIAGNOSIS — K219 Gastro-esophageal reflux disease without esophagitis: Secondary | ICD-10-CM | POA: Diagnosis present

## 2019-10-11 DIAGNOSIS — C7989 Secondary malignant neoplasm of other specified sites: Secondary | ICD-10-CM | POA: Diagnosis not present

## 2019-10-11 DIAGNOSIS — Z794 Long term (current) use of insulin: Secondary | ICD-10-CM

## 2019-10-11 DIAGNOSIS — C259 Malignant neoplasm of pancreas, unspecified: Secondary | ICD-10-CM | POA: Diagnosis not present

## 2019-10-11 DIAGNOSIS — A419 Sepsis, unspecified organism: Secondary | ICD-10-CM | POA: Diagnosis not present

## 2019-10-11 DIAGNOSIS — Z8249 Family history of ischemic heart disease and other diseases of the circulatory system: Secondary | ICD-10-CM

## 2019-10-11 DIAGNOSIS — Z95828 Presence of other vascular implants and grafts: Secondary | ICD-10-CM | POA: Diagnosis not present

## 2019-10-11 DIAGNOSIS — Z978 Presence of other specified devices: Secondary | ICD-10-CM | POA: Diagnosis not present

## 2019-10-11 LAB — COMPREHENSIVE METABOLIC PANEL
ALT: 72 U/L — ABNORMAL HIGH (ref 0–44)
AST: 98 U/L — ABNORMAL HIGH (ref 15–41)
Albumin: 2.1 g/dL — ABNORMAL LOW (ref 3.5–5.0)
Alkaline Phosphatase: 894 U/L — ABNORMAL HIGH (ref 38–126)
Anion gap: 13 (ref 5–15)
BUN: 32 mg/dL — ABNORMAL HIGH (ref 6–20)
CO2: 21 mmol/L — ABNORMAL LOW (ref 22–32)
Calcium: 8.3 mg/dL — ABNORMAL LOW (ref 8.9–10.3)
Chloride: 96 mmol/L — ABNORMAL LOW (ref 98–111)
Creatinine, Ser: 1.3 mg/dL — ABNORMAL HIGH (ref 0.61–1.24)
GFR calc Af Amer: >60 mL/min (ref >60)
GFR calc non Af Amer: 60 mL/min — ABNORMAL LOW (ref >60)
Glucose, Bld: 170 mg/dL — ABNORMAL HIGH (ref 70–99)
Potassium: 4.1 mmol/L (ref 3.5–5.1)
Sodium: 130 mmol/L — ABNORMAL LOW (ref 135–145)
Total Bilirubin: 6.8 mg/dL — ABNORMAL HIGH (ref 0.3–1.2)
Total Protein: 6.7 g/dL (ref 6.5–8.1)

## 2019-10-11 LAB — URINALYSIS, ROUTINE W REFLEX MICROSCOPIC
Glucose, UA: 150 mg/dL — AB
Ketones, ur: NEGATIVE mg/dL
Leukocytes,Ua: NEGATIVE
Nitrite: NEGATIVE
Protein, ur: 30 mg/dL — AB
Specific Gravity, Urine: >1.046 — ABNORMAL HIGH (ref 1.005–1.030)
pH: 5 (ref 5.0–8.0)

## 2019-10-11 LAB — CBC WITH DIFFERENTIAL/PLATELET
Abs Immature Granulocytes: 0.03 10*3/uL (ref 0.00–0.07)
Basophils Absolute: 0 10*3/uL (ref 0.0–0.1)
Basophils Relative: 0 %
Eosinophils Absolute: 0.1 10*3/uL (ref 0.0–0.5)
Eosinophils Relative: 3 %
HCT: 39.8 % (ref 39.0–52.0)
Hemoglobin: 12.9 g/dL — ABNORMAL LOW (ref 13.0–17.0)
Immature Granulocytes: 1 %
Lymphocytes Relative: 4 %
Lymphs Abs: 0.1 10*3/uL — ABNORMAL LOW (ref 0.7–4.0)
MCH: 28.3 pg (ref 26.0–34.0)
MCHC: 32.4 g/dL (ref 30.0–36.0)
MCV: 87.3 fL (ref 80.0–100.0)
Monocytes Absolute: 0 10*3/uL — ABNORMAL LOW (ref 0.1–1.0)
Monocytes Relative: 1 %
Neutro Abs: 2.8 10*3/uL (ref 1.7–7.7)
Neutrophils Relative %: 91 %
Platelets: 169 10*3/uL (ref 150–400)
RBC: 4.56 MIL/uL (ref 4.22–5.81)
RDW: 18.6 % — ABNORMAL HIGH (ref 11.5–15.5)
WBC: 3.1 10*3/uL — ABNORMAL LOW (ref 4.0–10.5)
nRBC: 0 % (ref 0.0–0.2)

## 2019-10-11 LAB — RESPIRATORY PANEL BY RT PCR (FLU A&B, COVID)
Influenza A by PCR: NEGATIVE
Influenza B by PCR: NEGATIVE
SARS Coronavirus 2 by RT PCR: NEGATIVE

## 2019-10-11 LAB — CBG MONITORING, ED
Glucose-Capillary: 122 mg/dL — ABNORMAL HIGH (ref 70–99)
Glucose-Capillary: 361 mg/dL — ABNORMAL HIGH (ref 70–99)
Glucose-Capillary: 400 mg/dL — ABNORMAL HIGH (ref 70–99)

## 2019-10-11 LAB — LIPASE, BLOOD: Lipase: 14 U/L (ref 11–51)

## 2019-10-11 LAB — HEMOGLOBIN A1C
Hgb A1c MFr Bld: 8.4 % — ABNORMAL HIGH (ref 4.8–5.6)
Mean Plasma Glucose: 194.38 mg/dL

## 2019-10-11 LAB — LACTIC ACID, PLASMA
Lactic Acid, Venous: 5.2 mmol/L (ref 0.5–1.9)
Lactic Acid, Venous: 3.1 mmol/L (ref 0.5–1.9)

## 2019-10-11 LAB — PROTIME-INR
INR: 1.9 — ABNORMAL HIGH (ref 0.8–1.2)
Prothrombin Time: 21.5 seconds — ABNORMAL HIGH (ref 11.4–15.2)

## 2019-10-11 LAB — APTT: aPTT: 36 seconds (ref 24–36)

## 2019-10-11 LAB — AMMONIA: Ammonia: 40 umol/L — ABNORMAL HIGH (ref 9–35)

## 2019-10-11 MED ORDER — INSULIN ASPART 100 UNIT/ML ~~LOC~~ SOLN
0.0000 [IU] | SUBCUTANEOUS | Status: DC
  Administered 2019-10-11: 9 [IU] via SUBCUTANEOUS
  Administered 2019-10-12: 3 [IU] via SUBCUTANEOUS
  Administered 2019-10-12: 9 [IU] via SUBCUTANEOUS
  Administered 2019-10-12: 5 [IU] via SUBCUTANEOUS
  Administered 2019-10-13: 1 [IU] via SUBCUTANEOUS
  Administered 2019-10-13: 2 [IU] via SUBCUTANEOUS
  Filled 2019-10-11: qty 0.09

## 2019-10-11 MED ORDER — VANCOMYCIN HCL IN DEXTROSE 1-5 GM/200ML-% IV SOLN
1000.0000 mg | Freq: Two times a day (BID) | INTRAVENOUS | Status: DC
  Administered 2019-10-11 – 2019-10-12 (×2): 1000 mg via INTRAVENOUS
  Filled 2019-10-11 (×2): qty 200

## 2019-10-11 MED ORDER — VANCOMYCIN HCL IN DEXTROSE 1-5 GM/200ML-% IV SOLN: 1000.0000 mg | Freq: Once | INTRAVENOUS | Status: DC

## 2019-10-11 MED ORDER — FAMOTIDINE 20 MG PO TABS: 10.0000 mg | ORAL_TABLET | Freq: Two times a day (BID) | ORAL | Status: DC | PRN

## 2019-10-11 MED ORDER — FERROUS SULFATE 325 (65 FE) MG PO TABS
325.0000 mg | ORAL_TABLET | Freq: Two times a day (BID) | ORAL | Status: DC
  Administered 2019-10-11 – 2019-10-16 (×9): 325 mg via ORAL
  Filled 2019-10-11 (×11): qty 1

## 2019-10-11 MED ORDER — B COMPLEX PO TABS: 1.0000 | ORAL_TABLET | Freq: Every day | ORAL | Status: DC

## 2019-10-11 MED ORDER — SODIUM CHLORIDE 0.9 % IV SOLN
INTRAVENOUS | Status: DC
  Administered 2019-10-11 – 2019-10-15 (×10): via INTRAVENOUS

## 2019-10-11 MED ORDER — PROCHLORPERAZINE MALEATE 10 MG PO TABS: 10.0000 mg | ORAL_TABLET | Freq: Four times a day (QID) | ORAL | Status: DC | PRN

## 2019-10-11 MED ORDER — LACTATED RINGERS IV BOLUS (SEPSIS)
1000.0000 mL | Freq: Once | INTRAVENOUS | Status: AC
  Administered 2019-10-11: 1000 mL via INTRAVENOUS

## 2019-10-11 MED ORDER — METRONIDAZOLE IN NACL 5-0.79 MG/ML-% IV SOLN
500.0000 mg | Freq: Three times a day (TID) | INTRAVENOUS | Status: DC
  Administered 2019-10-11 – 2019-10-12 (×4): 500 mg via INTRAVENOUS
  Filled 2019-10-11 (×4): qty 100

## 2019-10-11 MED ORDER — SODIUM CHLORIDE 0.9 % IV SOLN
2.0000 g | Freq: Three times a day (TID) | INTRAVENOUS | Status: DC
  Administered 2019-10-11 – 2019-10-12 (×3): 2 g via INTRAVENOUS
  Filled 2019-10-11 (×3): qty 2

## 2019-10-11 MED ORDER — SODIUM CHLORIDE 0.9 % IV SOLN
2.0000 g | Freq: Once | INTRAVENOUS | Status: AC
  Administered 2019-10-11: 2 g via INTRAVENOUS
  Filled 2019-10-11: qty 2

## 2019-10-11 MED ORDER — OXYCODONE HCL 5 MG PO TABS
5.0000 mg | ORAL_TABLET | Freq: Four times a day (QID) | ORAL | Status: DC | PRN
  Administered 2019-10-11 – 2019-10-15 (×6): 5 mg via ORAL
  Filled 2019-10-11 (×6): qty 1

## 2019-10-11 MED ORDER — IBUPROFEN 200 MG PO TABS
600.0000 mg | ORAL_TABLET | Freq: Once | ORAL | Status: AC
  Administered 2019-10-11: 600 mg via ORAL
  Filled 2019-10-11: qty 3

## 2019-10-11 MED ORDER — B COMPLEX-C PO TABS
1.0000 | ORAL_TABLET | Freq: Every day | ORAL | Status: DC
  Administered 2019-10-11 – 2019-10-16 (×6): 1 via ORAL
  Filled 2019-10-11 (×6): qty 1

## 2019-10-11 MED ORDER — NON FORMULARY: 10.0000 mg | Freq: Every evening | Status: DC | PRN

## 2019-10-11 MED ORDER — ONDANSETRON 4 MG PO TBDP: 4.0000 mg | ORAL_TABLET | Freq: Four times a day (QID) | ORAL | Status: DC | PRN

## 2019-10-11 MED ORDER — METRONIDAZOLE IN NACL 5-0.79 MG/ML-% IV SOLN
500.0000 mg | Freq: Once | INTRAVENOUS | Status: AC
  Administered 2019-10-11: 500 mg via INTRAVENOUS
  Filled 2019-10-11: qty 100

## 2019-10-11 MED ORDER — OMEGA-3-ACID ETHYL ESTERS 1 G PO CAPS
1.0000 g | ORAL_CAPSULE | Freq: Every day | ORAL | Status: DC
  Administered 2019-10-11 – 2019-10-12 (×2): 1 g via ORAL
  Filled 2019-10-11 (×2): qty 1

## 2019-10-11 MED ORDER — VANCOMYCIN HCL 10 G IV SOLR
1500.0000 mg | Freq: Once | INTRAVENOUS | Status: AC
  Administered 2019-10-11: 1500 mg via INTRAVENOUS
  Filled 2019-10-11: qty 1500

## 2019-10-11 MED ORDER — IOHEXOL 350 MG/ML SOLN
100.0000 mL | Freq: Once | INTRAVENOUS | Status: AC | PRN
  Administered 2019-10-11: 75 mL via INTRAVENOUS

## 2019-10-11 MED ORDER — ACETAMINOPHEN 325 MG PO TABS
650.0000 mg | ORAL_TABLET | Freq: Three times a day (TID) | ORAL | Status: DC | PRN
  Administered 2019-10-12 – 2019-10-16 (×6): 650 mg via ORAL
  Filled 2019-10-11 (×6): qty 2

## 2019-10-11 MED ORDER — SODIUM CHLORIDE 0.9 % IV BOLUS
1000.0000 mL | Freq: Once | INTRAVENOUS | Status: AC
  Administered 2019-10-11: 1000 mL via INTRAVENOUS

## 2019-10-11 MED ORDER — LIDOCAINE 5 % EX PTCH
1.0000 | MEDICATED_PATCH | Freq: Every day | CUTANEOUS | Status: DC
  Administered 2019-10-11 – 2019-10-15 (×5): 1 via TRANSDERMAL
  Filled 2019-10-11 (×6): qty 1

## 2019-10-11 MED ORDER — PANCRELIPASE (LIP-PROT-AMYL) 12000-38000 UNITS PO CPEP
36000.0000 [IU] | ORAL_CAPSULE | Freq: Three times a day (TID) | ORAL | Status: DC
  Administered 2019-10-11 – 2019-10-16 (×14): 36000 [IU] via ORAL
  Filled 2019-10-11 (×3): qty 1
  Filled 2019-10-11: qty 3
  Filled 2019-10-11: qty 1
  Filled 2019-10-11 (×10): qty 3

## 2019-10-11 MED ORDER — SODIUM CHLORIDE (PF) 0.9 % IJ SOLN
INTRAMUSCULAR | Status: AC
  Administered 2019-10-11: 09:00:00
  Filled 2019-10-11: qty 50

## 2019-10-11 MED ORDER — MELATONIN 5 MG PO TABS
10.0000 mg | ORAL_TABLET | Freq: Every evening | ORAL | Status: DC | PRN
  Administered 2019-10-12 – 2019-10-13 (×3): 10 mg via ORAL
  Filled 2019-10-11 (×4): qty 2

## 2019-10-11 NOTE — Progress Notes (Signed)
A consult was received from an ED physician for vancomycin & cefepime per pharmacy dosing.  The patient's profile has been reviewed for ht/wt/allergies/indication/available labs.   A one time order has been placed for Vancomycin 1500 mg, Cefepime 2 gm, Flagyl 500 mg IV x 1 dose.    Further antibiotics/pharmacy consults should be ordered by admitting physician if indicated.                       Thank you,  Eudelia Bunch, Pharm.D [phone] 10/11/2019 7:26 AM

## 2019-10-11 NOTE — ED Notes (Signed)
Pt provided urinal for voiding, will collect for urine.

## 2019-10-11 NOTE — Progress Notes (Signed)
IP PROGRESS NOTE  Subjective:   Brian Torres is well-known to me with a history of metastatic pancreas cancer.  He was scheduled for an appointment at the Cancer center today.  He developed chills at the hospital for a CT yesterday.  The chills persisted at home.  He had a presyncope event this morning and presented to the emergency room.  Brian Torres reports feeling better at present.  No pain.  He was noted to be febrile in the emergency room.  Cultures were obtained and he was placed on broad-spectrum antibiotics.  Objective: Vital signs in last 24 hours: Blood pressure (!) 90/57, pulse 88, temperature 98.6 F (37 C), temperature source Oral, resp. rate 16, SpO2 99 %.  Intake/Output from previous day: No intake/output data recorded.  Physical Exam: Cardiac: Regular rate and rhythm Abdomen: Masslike fullness throughout the right upper abdomen,  the midline incision has healed, no surrounding erythema Extremities: No leg edema Skin: Jaundice Neurologic: Alert and oriented  Portacath/PICC-without erythema  Lab Results: Recent Labs    10/09/19 1024 10/11/19 0710  WBC 13.0* 3.1*  HGB 8.8* 12.9*  HCT 26.6* 39.8  PLT 268 169    BMET Recent Labs    10/09/19 1024 10/11/19 0710  NA 131* 130*  K 5.1 4.1  CL 96* 96*  CO2 25 21*  GLUCOSE 245* 170*  BUN 21* 32*  CREATININE 1.09 1.30*  CALCIUM 8.7* 8.3*    No results found for: CEA1  Studies/Results: Ct Angio Chest Pe W And/or Wo Contrast  Result Date: 10/11/2019 CLINICAL DATA:  60ml [id number] Pt had whipple procedure done in sept 2020. Pt was to start chemo today. Bilirubin level is going up. Denies any chest pain or chest pressure. Pt tates has a rapid heart rate. eval for pE EXAM: CT ANGIOGRAPHY CHEST WITH CONTRAST TECHNIQUE: Multidetector CT imaging of the chest was performed using the standard protocol during bolus administration of intravenous contrast. Multiplanar CT image reconstructions and MIPs were obtained to evaluate  the vascular anatomy. CONTRAST:  100mL OMNIPAQUE IOHEXOL 350 MG/ML SOLN COMPARISON:  CT abdomen pelvis 10/10/2019 FINDINGS: Cardiovascular: No filling defects within the pulmonary arteries to suggest acute pulmonary embolism. No acute findings of the aorta or great vessels. No pericardial fluid. Mediastinum/Nodes: No axillary supraclavicular adenopathy. No mediastinal hilar adenopathy. No pericardial effusion. Port in the anterior chest wall with tip in distal SVC. Lungs/Pleura: No suspicious pulmonary nodules. No infiltrate Upper Abdomen: Innumerable hepatic metastasis again noted. Musculoskeletal: No aggressive osseous lesion Review of the MIP images confirms the above findings. IMPRESSION: 1. No evidence acute pulmonary embolism. 2. No acute pulmonary parenchymal findings. 3. Innumerable hepatic metastasis. Electronically Signed   By: Suzy Bouchard M.D.   On: 10/11/2019 09:50   Ct Abdomen Pelvis W Contrast  Result Date: 10/10/2019 CLINICAL DATA:  58 year old male with history of pancreatic cancer diagnosed in February 2020. Elevated bilirubin. Evaluate for potential biliary obstruction. EXAM: CT ABDOMEN AND PELVIS WITH CONTRAST TECHNIQUE: Multidetector CT imaging of the abdomen and pelvis was performed using the standard protocol following bolus administration of intravenous contrast. CONTRAST:  141mL OMNIPAQUE IOHEXOL 300 MG/ML  SOLN COMPARISON:  CT the abdomen and pelvis 08/24/2019. Abdominal MRI 09/05/2019. FINDINGS: Lower chest: Unremarkable. Hepatobiliary: Marked increase in number and size of what are now innumerable low-intermediate attenuation lesions scattered throughout the hepatic parenchyma, largest of which measure up to 3.6 x 2.4 cm in segment 4B of the liver (axial image 37 of series 3). Some of these lesions appear elongated  within the hepatic parenchyma best appreciated on sagittal image 53 of series 7, where that length of the lesion previously described measures up to 11.6 cm. These  lesions are exerting local mass effect upon adjacent structures causing compression of central bile ducts resulting in mild to moderate intrahepatic biliary ductal dilatation throughout the right lobe of the liver. Status post cholecystectomy is part of Whipple procedure. In the gallbladder fossa there is a large amount of intermediate attenuation fluid and/or soft tissue which has a thick rim of peripheral enhancement estimated to measure approximately 6.1 x 6.6 x 3.5 cm (axial image 43 of series 3 and coronal image 33 of series 6). This soft tissue appears intimately associated both with the undersurface of the liver, as well as several adjacent small bowel loops, in close proximity to the presumed location of the hepaticojejunostomy. Previously noted fluid collection beneath the right lobe of the liver (which previously had a percutaneous drainage catheter on prior CT 08/24/2019) is again noted on today's examination, currently measuring 5.7 x 5.3 x 6.9 cm (axial image 51 of series 3 and coronal image 34 of series 6) with thick peripheral rim of soft tissue and enhancement which appears to partially extend into the right oblique musculature best appreciated on axial image 47 of series 3 and coronal image 33 of series 6). Pancreas: Postoperative changes of Whipple procedure. Spleen: Unremarkable. Adrenals/Urinary Tract: 1.5 cm low-attenuation lesion in the upper pole of the right kidney, compatible with a simple cyst. Left kidney and bilateral adrenal glands are normal in appearance. No hydroureteronephrosis. Urinary bladder is normal in appearance. Stomach/Bowel: Postoperative changes of Whipple procedure. Loops of small bowel and colon traversing beneath the right lobe of the liver appear thickened as they come in close association with the previously noted fluid and/or soft tissue collection in this region. No pathologic dilatation of small bowel or colon. Normal appendix. Vascular/Lymphatic: Aortic  atherosclerosis, without evidence of aneurysm or dissection in the abdominal or pelvic vasculature. Small of filling defect in the proximal portal vein (axial image 36 of series 3), concerning for nonobstructive thrombus. Enlarged lymph node immediately anterior to the superior mesenteric vein (axial image 43 of series 3) measuring 1.5 cm in short axis. Reproductive: Prostate gland and seminal vesicles are unremarkable in appearance. Other: Trace volume of perihepatic ascites.  No pneumoperitoneum. Musculoskeletal: There are no aggressive appearing lytic or blastic lesions noted in the visualized portions of the skeleton. IMPRESSION: 1. Marked increased number and size of innumerable hepatic lesions, as well as persistence of right infra hepatic postoperative fluid collection and development of an additional fluid and/or soft tissue collection beneath the gallbladder fossa. Overall, the findings are favored to reflect progressive metastatic disease to the liver, however, there is overlap with potential infectious etiologies (multiple intrahepatic abscesses and subhepatic abscesses). Guided sampling of one or more of these lesions is suggested to establish a definitive diagnosis. 2. 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 3. Interval development of mildly enlarged mesenteric lymph node measuring 1.5 cm in short axis immediately anterior to the portal vein, suspicious for a metastatic lymph node, although reactive lymphadenopathy in the setting of extensive infection is not excluded. 4. Additional incidental findings, as above. Electronically Signed   By: Vinnie Langton M.D.   On: 10/10/2019 17:34   Dg Chest Port 1 View  Result Date: 10/11/2019 CLINICAL DATA:  Chills. Additional history provided: Syncopal episode, history of liver CA. EXAM: PORTABLE CHEST 1 VIEW COMPARISON:  CT angiogram chest 08/07/2019,  chest radiograph 08/07/2011 FINDINGS: Left chest infusion port catheter with tip  projecting in the region of the superior cavoatrial junction. Heart size within normal limits. No airspace consolidation within the lungs. No evidence of pleural effusion or pneumothorax. No acute bony abnormality. IMPRESSION: No evidence of acute cardiopulmonary abnormality. Electronically Signed   By: Kellie Simmering DO   On: 10/11/2019 07:57    Medications: I have reviewed the patient's current medications.  Assessment/Plan: 1. Pancreas head mass  Presented with jaundice, bilirubin elevated at 21.4  CTs abdomen/pelvis 12/20/2018-severe intrahepatic and extrahepatic biliary dilatation due to a possible 3.5 x 2.5 cm solid mass in the pancreatic head. Pancreatic ductal dilatation noted as well with multiple cystic lesions in the pancreatic tail and body.   CT chest 12/20/2018-occasional nonspecific small pulmonary nodules felt to likely be related to prior infection or inflammation.   ERCP by Dr. Watt Climes 12/21/2018. The major papilla was on the rim of a diverticulum. The minor papilla appeared to be bulging. A biopsy of the duodenum adjacent to the minor papilla was performed. Plastic stent was placed into the ventral pancreatic duct. Biliary sphincterotomy performed. Covered metal stent placed into the common bile duct. Bile duct brushings showed benign reactive/reparative changes. Biopsy of polypoid duodenal mucosa suggestive of nodular peptic duodenitis, negative for dysplasia or malignancy.   Upper EUS by Dr. Paulita Fujita on 01/03/2019 showed a few cystic lesions in the pancreatic body and pancreatic tail. A stent was visualized in the common bile duct. Mass identified in the pancreatic head with fine-needle aspiration performed. Lesion appeared to abut or potentially superficially invade the superior mesenteric vein. No lymphadenopathy. No involvement of the superior mesenteric artery or celiac artery. Pathology returned suspicious for malignancy. Dr. Paulita Fujita notes if pathology shows  adenocarcinoma the lesion would be staged T3 N0 MX.  Repeat ERCP and placement of a metal bile duct stent 01/24/2019  EUS 01/24/2019, T3N0 pancreas head mass with abutment of the SMV, FNA of the pancreas mass revealed adenocarcinoma, cytology from the bile duct stent revealed adenocarcinoma  Cycle 1 FOLFIRINOX4/04/2019  Cycle 2 FOLFIRINOX4/20/2020, Udenyca added  Cycle 3 FOLFIRINOX 03/09/2019, Irinotecan and 5-fluorouracil dose reduced, Udenyca held  Cycle 4 FOLFIRINOX 03/21/2019-Udenyca held  Cycle 5 FOLFIRINOX6/01/2019  Cycle 6 FOLFIRINOX 04/24/2019 (oxaliplatin dose reduced due to poor tolerance of chemotherapy)  CT 05/11/2019-decreased size of pancreas head mass, unchanged peripancreatic lymph node, no evidence of metastatic disease, improved/resolved fluid collections at the pancreas tail  Cycle 7 FOLFIRINOX 05/22/2019  Cycle 8 FOLFIRINOX 06/06/2019  07/20/2019 pancreaticoduodenectomy 07/20/2019-pT2, pN0 (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  Treatment effect absent, extensive residual cancer with no evident tumor regression)  MRI abdomen 09/05/2019-reaccumulation of fluid along the perihepatic margin following presumed removal of the pigtail drainage catheter that was present in this area on the study of 08/24/2019; some fluid in the region of the porta hepatis and pancreaticobiliary limb; small collection of fluid just inferior to the interlobar fissure in the liver along the course of the previous drain; tract extending from this area to the midline abdominal wound; innumerable areas of abnormality seen on T2 and diffusion throughout the liver, largest in the range of 1 cm.  All areas of the liver affected, both right and left hepatic lobes.  Along the  gallbladder fossa is a peripherally enhancing 1.8 x 1.3 cm focus.  Ultrasound-guided biopsy of a liver lesion 09/26/2019-adenocarcinoma compatible with pancreatic adenocarcinoma  CT abdomen/pelvis 10/10/2019-increased size and number of hepatic lesions right infrahepatic fluid/soft tissue collection, nonobstructive thrombus in the proximal portal vein, mildly enlarged mesenteric lymph node 2. Biliary obstruction status post stent placement 3. History of left lower extremity DVT 2017, 2018-on Eliquis. 4. Type 2 diabetes 5. Hypertension 6. Chronic kidney disease 7. Tobacco use 8. Anemia, macrocytic. Transfused 2 units of blood 12/20/2018.On oral iron. Improved 02/01/2019 and 02/06/2019. 9. Port-A-Cath placement 02/02/2019, Dr. Barry Dienes 10. 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 Hospitalized 08/07/2019 through 08/11/2019 with postsurgical intra-abdominal fluid collection.  CT 08/07/2019 with 2 rim-enhancing fluid collections in the right upper quadrant concerning for abscesses, moderate wall thickening and pericolonic inflammatory changes involving the distal ascending, transverse and descending colon consistent with colitis.  Blood culture 08/07/2019 + for Prevotella buccae beta-lactamase positive; status post CT-guided aspiration of intra-abdominal fluid collection and drain placement on 08/09/2019, culture with multiple organisms present, none predominant; follow-up CT 08/24/2019 with resolution of dominant fluid collection within the right upper abdominal quadrant, tiny serpiginous fluid collection within the ventral aspect of the right upper abdomen, interval development of multiple ill-defined hypoattenuating liver lesions; MRI abdomen 09/05/2019 reaccumulation of fluid along the perihepatic margin following presumed removal of the pigtail drainage catheter that was present in this area on the study of 08/24/2019; some fluid in  the region of the porta hepatis and pancreaticobiliary limb; small collection of fluid just inferior to the interlobar fissure in the liver along the course of the previous drain; tract extending from this area to the midline abdominal wound; innumerable areas of abnormality seen on T2 and diffusion throughout the liver, largest in the range of 1 cm.  All areas of the liver affected, both right and left hepatic lobes.  Along the gallbladder fossa is a peripherally enhancing 1.8 x 1.3 cm focus. 13. Admission 10/11/2019 with sepsis syndrome  Brian Torres has metastatic pancreas cancer.  He was scheduled to begin salvage chemotherapy with gemcitabine/Abraxane 10/09/2019.  Chemotherapy was held secondary to new hyperbilirubinemia.  He presents today with a fever and evidence of sepsis.  He has been placed on broad-spectrum antibiotics.  I suspect he has bacteremia related to an abdominal abscess and potentially biliary obstruction.  The CT yesterday reveals evidence of progressive cancer involving the liver and upper abdominal soft tissue.  I reviewed the CT images with interventional radiology earlier today.  They think it is unlikely he will benefit from a percutaneous biliary drain as there is no clear evidence of large duct obstruction.  They can be consulted for drainage of the perihepatic fluid collection.  Recommendations: 1.  Continue broad-spectrum intravenous antibiotics, follow-up cultures 2.  Consult interventional radiology to consider drainage of the right upper abdomen fluid collection and evaluate for biliary obstruction       LOS: 0 days   Betsy Coder, MD   10/11/2019, 2:19 PM

## 2019-10-11 NOTE — H&P (Addendum)
History and Physical    Brian Torres [id number] DOB: [birth year] DOA: 10/11/2019  PCP: Cari Caraway, MD  Patient coming from: Home  I have personally briefly reviewed patient's old medical records in [place]  Chief Complaint: Fevers, weakness  HPI: Brian Torres is a 58 y.o. male with medical history significant of metastatic pancreatic cancer, T2DM, HLD, HTN, and Depression who presents with fevers/chills.  Patient brought in with his wife.  He has been gradually declining over the past 2 weeks.  Per the wife, he has been having dwindling energy at most 2 hours/day.  However in the last couple of days they thought he was rallying.  He underwent CT of his abdomen and pelvis yesterday.  He had some diarrhea which they attributed to the contrast.  He had dinner around 830 and then went to bed.  This morning around 5 AM he woke up to use the restroom.  He began to have fevers and chills.  He got back in bed and when he tried to return to the restroom an hour later he was just too tired.  He was unable to make it to the bathroom but he did not pass out on the way.  He was brought in by EMS.  In the emergency room he has had fevers but he reports he does not feel them.  He has not noticed any significant abdominal pain.  He has a slight cough.  No other rashes.  There are home health nurse comes out every day to look at the abdominal dressing.  It has been healing well.  They report no fevers have been documented at home for the past couple of weeks and he has not felt febrile.  Otherwise he is at his baseline which has been gradually worsening.  No chest pain, palpitations, neurologic symptoms.  He continues to smoke 5 packs/week, no alcohol use or drug use  Review of Systems: As per HPI otherwise 10 point review of systems negative.    Past Medical History:  Diagnosis Date  . Cancer ([place]) 12/2018   pancreatic cancer  . Depression   . Diabetes mellitus without complication  ([place])    type 2  . Dyslipidemia   . Erectile dysfunction   . Family history of breast cancer   . Family history of melanoma   . Hypertension    white coat syndrome  . Nicotine addiction     Past Surgical History:  Procedure Laterality Date  . BILIARY STENT PLACEMENT N/A 12/21/2018   Procedure: BILIARY STENT PLACEMENT;  Surgeon: Clarene Essex, MD;  [facility]: [facility];  Service: Endoscopy;  Laterality: N/A;  . BILIARY STENT PLACEMENT N/A 01/24/2019   Procedure: BILIARY STENT PLACEMENT;  Surgeon: Arta Silence, MD;  [facility];  Service: Endoscopy;  Laterality: N/A;  . BIOPSY  12/21/2018   Procedure: BIOPSY;  Surgeon: Clarene Essex, MD;  [facility];  Service: Endoscopy;;  . ENDOSCOPIC RETROGRADE CHOLANGIOPANCREATOGRAPHY (ERCP) WITH PROPOFOL N/A 12/21/2018   Procedure: ENDOSCOPIC RETROGRADE CHOLANGIOPANCREATOGRAPHY (ERCP) WITH PROPOFOL;  Surgeon: Clarene Essex, MD;  [facility];  Service: Endoscopy;  Laterality: N/A;  . ENDOSCOPIC RETROGRADE CHOLANGIOPANCREATOGRAPHY (ERCP) WITH PROPOFOL N/A 01/24/2019   Procedure: ENDOSCOPIC RETROGRADE CHOLANGIOPANCREATOGRAPHY (ERCP) WITH PROPOFOL;  Surgeon: Arta Silence, MD;  [facility];  Service: Endoscopy;  Laterality: N/A;  Balloon Sweep of Duct  . ESOPHAGOGASTRODUODENOSCOPY N/A 01/03/2019   Procedure: ESOPHAGOGASTRODUODENOSCOPY (EGD);  Surgeon: Arta Silence, MD;  Location: [name];  Service: Endoscopy;  Laterality:  N/A;  . ESOPHAGOGASTRODUODENOSCOPY (EGD) WITH PROPOFOL N/A 01/24/2019   Procedure: ESOPHAGOGASTRODUODENOSCOPY (EGD) WITH PROPOFOL;  Surgeon: Arta Silence, MD;  [facility];  Service: Endoscopy;  Laterality: N/A;  stent removal  . EUS N/A 01/03/2019   Procedure: FULL UPPER ENDOSCOPIC ULTRASOUND (EUS) Linear not radial;  Surgeon: Arta Silence, MD;  [facility];  Service: Endoscopy;  Laterality: N/A;  . EUS N/A 01/24/2019   Procedure: FULL UPPER ENDOSCOPIC ULTRASOUND  (EUS) RADIAL;  Surgeon: Arta Silence, MD;  [facility];  Service: Endoscopy;  Laterality: N/A;  . FINE NEEDLE ASPIRATION N/A 01/03/2019   Procedure: FINE NEEDLE ASPIRATION (FNA) LINEAR;  Surgeon: Arta Silence, MD;  [facility];  Service: Endoscopy;  Laterality: N/A;  . FINE NEEDLE ASPIRATION N/A 01/24/2019   Procedure: FINE NEEDLE ASPIRATION (FNA) LINEAR;  Surgeon: Arta Silence, MD;  [facility];  Service: Endoscopy;  Laterality: N/A;  . IR RADIOLOGIST EVAL & MGMT  08/24/2019  . LAPAROSCOPY N/A 07/20/2019   Procedure: LAPAROSCOPY DIAGNOSTIC;  Surgeon: [name], MD;  Location: [facility];  Service: General;  Laterality: N/A;  . LOWER EXTREMITY ANGIOGRAPHY N/A 10/18/2017   Procedure: LOWER EXTREMITY ANGIOGRAPHY - IVUS - LYSIS CATH;  Surgeon: Waynetta Sandy, MD;  Location: [facility];  Service: Cardiovascular;  Laterality: N/A;  . LOWER EXTREMITY ANGIOGRAPHY N/A 10/19/2017   Procedure: LOWER EXTREMITY ANGIOGRAPHY - LYSIS RECHECK;  Surgeon: Waynetta Sandy, MD;  Location: [facility];  Service: Cardiovascular;  Laterality: N/A;  . PANCREATIC STENT PLACEMENT  12/21/2018   Procedure: PANCREATIC STENT PLACEMENT;  Surgeon: Clarene Essex, MD;  [facility];  Service: Endoscopy;;  . PEG PLACEMENT N/A 07/20/2019   Procedure: FEEDING TUBE;  Surgeon: [name], MD;  Location: [facility];  Service: General;  Laterality: N/A;  . PERIPHERAL VASCULAR BALLOON ANGIOPLASTY Left 10/19/2017   Procedure: PERIPHERAL VASCULAR BALLOON ANGIOPLASTY;  Surgeon: Waynetta Sandy, MD;  Location: [facility];  Service: Cardiovascular;  Laterality: Left;  Multiple Lower extremity venous sites  . PERIPHERAL VASCULAR INTERVENTION Left 10/18/2017   Procedure: PERIPHERAL VASCULAR INTERVENTION;  Surgeon: Waynetta Sandy, MD;  Location: [facility];  Service: Cardiovascular;  Laterality: Left;  . PORTACATH PLACEMENT Left  02/02/2019   Procedure: INSERTION PORT-A-CATH WITH POSSIBLE ULTRASOUND;  Surgeon: [name], MD;  Location: [facility];  Service: General;  Laterality: Left;  . REMOVAL OF STONES  12/21/2018   Procedure: REMOVAL OF STONES;  Surgeon: Clarene Essex, MD;  [facility];  Service: Endoscopy;;  . Joan Mayans  12/21/2018   Procedure: Joan Mayans;  Surgeon: Clarene Essex, MD;  [facility];  Service: Endoscopy;;  . STENT REMOVAL  01/24/2019   Procedure: STENT REMOVAL;  Surgeon: Arta Silence, MD;  [facility];  Service: Endoscopy;;  . WHIPPLE PROCEDURE N/A 07/20/2019   Procedure: WHIPPLE PROCEDURE;  Surgeon: [name], MD;  Location: [facility];  Service: General;  Laterality: N/A;     reports that he has been smoking cigarettes. He has been smoking about 1.00 pack per day. He has never used smokeless tobacco. He reports that he does not drink alcohol or use drugs.  No Known Allergies  Family History  Problem Relation Age of Onset  . Diabetes Mother   . Diabetes Father   . CAD Father   . Melanoma Father   . Breast cancer Paternal Aunt 81  . COPD Maternal Aunt   . Melanoma Paternal Uncle   . Stroke Maternal  Grandmother   . Tuberculosis Paternal Grandmother   . Cancer Neg Hx      Prior to Admission medications   Medication Sig Start Date End Date Taking? Authorizing Provider  acetaminophen (TYLENOL) 500 MG tablet Take 1,000 mg by mouth every 6 (six) hours as needed for moderate pain or headache.    [provider]  apixaban (ELIQUIS) 5 MG TABS tablet Take 1 tablet (5 mg total) by mouth 2 (two) times daily. 01/04/19   Arta Silence, MD  b complex vitamins tablet Take 1 tablet by mouth daily.    [provider]  CINNAMON PO Take 1,000 mg by mouth 2 (two) times daily.    [provider]  Coenzyme Q10 (COQ-10 PO) Take 1 tablet by mouth daily.    [provider]  CREON 36000 units CPEP capsule Take by mouth.  Takes #2 with meals and #1 with snack 09/30/19   [provider]  famotidine (PEPCID AC) 10 MG tablet Take 10 mg by mouth 2 (two) times daily as needed for heartburn or indigestion.    [provider]  ferrous sulfate 325 (65 FE) MG EC tablet Take 325 mg by mouth 2 (two) times a day.     [provider]  glimepiride (AMARYL) 4 MG tablet Take 4 mg by mouth daily with breakfast.  10/13/17   [provider]  HUMALOG KWIKPEN 100 UNIT/ML KwikPen Inject 3-13 Units into the skin 2 (two) times daily as needed (if bs is over 150).  06/29/19   [provider]  Insulin Glargine (BASAGLAR KWIKPEN) 100 UNIT/ML SOPN Inject 19 Units into the skin at bedtime.  10/19/18   [provider]  Lidocaine (HM LIDOCAINE PATCH) 4 % PTCH Apply 1 patch topically at bedtime.    [provider]  lidocaine-prilocaine (EMLA) cream Apply 1 application topically as needed (port access). 10/09/19   Ladell Pier, MD  lisinopril (PRINIVIL,ZESTRIL) 10 MG tablet Take 10 mg by mouth daily. 10/13/17   [provider]  LORazepam (ATIVAN) 0.5 MG tablet Take 1 tablet (0.5 mg total) by mouth every 8 (eight) hours as needed (for nausea). 05/01/19   Ladell Pier, MD  Melatonin 10 MG TABS Take 20 mg by mouth at bedtime.    [provider]  metFORMIN (GLUCOPHAGE) 1000 MG tablet Take 1,000 mg by mouth 2 (two) times daily. 10/13/17   [provider]  methocarbamol (ROBAXIN) 500 MG tablet Take 1 tablet (500 mg total) by mouth every 6 (six) hours as needed for muscle spasms. 07/28/19   [name], MD  Omega-3 Fatty Acids (FISH OIL) 1000 MG CAPS Take 1,000 mg by mouth 2 (two) times daily.     [provider]  ondansetron (ZOFRAN-ODT) 4 MG disintegrating tablet Take 1 tablet (4 mg total) by mouth every 6 (six) hours as needed for nausea. 10/02/19   Ladell Pier, MD  oxyCODONE (OXY IR/ROXICODONE) 5 MG immediate release tablet Take 1 tablet (5 mg  total) by mouth every 6 (six) hours as needed for moderate pain, severe pain or breakthrough pain. 10/02/19   Ladell Pier, MD  pioglitazone (ACTOS) 45 MG tablet Take 45 mg by mouth daily. 10/13/17   [provider]  prochlorperazine (COMPAZINE) 10 MG tablet Take 1 tablet (10 mg total) by mouth every 6 (six) hours as needed for nausea or vomiting. 04/14/19   Ladell Pier, MD    Physical Exam: Vitals:   10/11/19 0700 10/11/19 0800 10/11/19  0830  BP: 109/60 (!) 96/45 (!) 106/57  Pulse: (!) 139 (!) 121 (!) 127  Resp: (!) 33 (!) 27   Temp: (!) 103.9 F (39.9 C)    TempSrc: Rectal    SpO2: 96% 99% 98%     Vitals:   10/11/19 0700 10/11/19 0800 10/11/19 0830  BP: 109/60 (!) 96/45 (!) 106/57  Pulse: (!) 139 (!) 121 (!) 127  Resp: (!) 33 (!) 27   Temp: (!) 103.9 F (39.9 C)    TempSrc: Rectal    SpO2: 96% 99% 98%   Constitutional: resting, appears fatigued Eyes: PERRL, EOMI ENMT: Mucous membranes are moist. Posterior pharynx clear of any exudate or lesions.Normal dentition.  Neck: normal, supple, no masses, no thyromegaly Respiratory: clear to auscultation bilaterally, no wheezing, no crackles. Normal respiratory effort. No accessory muscle use.  Cardiovascular: tachycardic rate and rhythm, no murmurs / rubs / gallops. No extremity edema. 2+ pedal pulses. No carotid bruits.  Abdomen: no tenderness, no masses palpated. Dressing/wound c/d/i. + hepatomegaly. Bowel sounds positive.  Musculoskeletal: no clubbing / cyanosis. No joint deformity upper and lower extremities. Good ROM, no contractures. Normal muscle tone.  Skin: no rashes, lesions, ulcers. No induration Neurologic: CN intact, grossly non-focal Psychiatric: Normal judgment and insight. Alert and oriented x 3. Normal mood.    Labs on Admission: I have personally reviewed following labs and imaging studies  CBC: Recent Labs  Lab 10/09/19 1024 10/11/19 0710  WBC 13.0* 3.1*  NEUTROABS 10.5* 2.8  HGB 8.8*  12.9*  HCT 26.6* 39.8  MCV 87.2 87.3  PLT 268 [id number]   Basic Metabolic Panel: Recent Labs  Lab 10/09/19 1024 10/11/19 0710  NA 131* 130*  K 5.1 4.1  CL 96* 96*  CO2 25 21*  GLUCOSE 245* 170*  BUN 21* 32*  CREATININE 1.09 1.30*  CALCIUM 8.7* 8.3*   GFR: Estimated Creatinine Clearance: 71.6 mL/min (A) (by C-G formula based on SCr of 1.3 mg/dL (H)). Liver Function Tests: Recent Labs  Lab 10/09/19 1024 10/11/19 0710  AST 120* 98*  ALT 81* 72*  ALKPHOS 1,292* 894*  BILITOT 5.0* 6.8*  PROT 7.3 6.7  ALBUMIN 2.2* 2.1*   Recent Labs  Lab 10/11/19 0710  LIPASE 14   Recent Labs  Lab 10/11/19 0721  AMMONIA 40*   Coagulation Profile: Recent Labs  Lab 10/11/19 0710  INR 1.9*   Cardiac Enzymes: No results for input(s): CKTOTAL, CKMB, CKMBINDEX, TROPONINI in the last 168 hours. BNP (last 3 results) No results for input(s): PROBNP in the last 8760 hours. HbA1C: No results for input(s): HGBA1C in the last 72 hours. CBG: No results for input(s): GLUCAP in the last 168 hours. Lipid Profile: No results for input(s): CHOL, HDL, LDLCALC, TRIG, CHOLHDL, LDLDIRECT in the last 72 hours. Thyroid Function Tests: No results for input(s): TSH, T4TOTAL, FREET4, T3FREE, THYROIDAB in the last 72 hours. Anemia Panel: No results for input(s): VITAMINB12, FOLATE, FERRITIN, TIBC, IRON, RETICCTPCT in the last 72 hours. Urine analysis:    Component Value Date/Time   COLORURINE AMBER (A) 08/07/2019 1526   APPEARANCEUR HAZY (A) 08/07/2019 1526   LABSPEC >1.046 (H) 08/07/2019 1526   PHURINE 5.0 08/07/2019 1526   GLUCOSEU NEGATIVE 08/07/2019 1526   HGBUR NEGATIVE 08/07/2019 [place] 08/07/2019 1526   KETONESUR 5 (A) 08/07/2019 1526   PROTEINUR 30 (A) 08/07/2019 1526   NITRITE NEGATIVE 08/07/2019 1526   LEUKOCYTESUR NEGATIVE 08/07/2019 1526    Radiological Exams on Admission: Ct Angio Chest  Pe W And/or Wo Contrast  Result Date: 10/11/2019 CLINICAL DATA:  26ml  [id number] Pt had whipple procedure done in sept 2020. Pt was to start chemo today. Bilirubin level is going up. Denies any chest pain or chest pressure. Pt tates has a rapid heart rate. eval for pE EXAM: CT ANGIOGRAPHY CHEST WITH CONTRAST TECHNIQUE: Multidetector CT imaging of the chest was performed using the standard protocol during bolus administration of intravenous contrast. Multiplanar CT image reconstructions and MIPs were obtained to evaluate the vascular anatomy. CONTRAST:  17mL OMNIPAQUE IOHEXOL 350 MG/ML SOLN COMPARISON:  CT abdomen pelvis 10/10/2019 FINDINGS: Cardiovascular: No filling defects within the pulmonary arteries to suggest acute pulmonary embolism. No acute findings of the aorta or great vessels. No pericardial fluid. Mediastinum/Nodes: No axillary supraclavicular adenopathy. No mediastinal hilar adenopathy. No pericardial effusion. Port in the anterior chest wall with tip in distal SVC. Lungs/Pleura: No suspicious pulmonary nodules. No infiltrate Upper Abdomen: Innumerable hepatic metastasis again noted. Musculoskeletal: No aggressive osseous lesion Review of the MIP images confirms the above findings. IMPRESSION: 1. No evidence acute pulmonary embolism. 2. No acute pulmonary parenchymal findings. 3. Innumerable hepatic metastasis. Electronically Signed   By: Suzy Bouchard M.D.   On: 10/11/2019 09:50   Ct Abdomen Pelvis W Contrast  Result Date: 10/10/2019 CLINICAL DATA:  58 year old male with history of pancreatic cancer diagnosed in February 2020. Elevated bilirubin. Evaluate for potential biliary obstruction. EXAM: CT ABDOMEN AND PELVIS WITH CONTRAST TECHNIQUE: Multidetector CT imaging of the abdomen and pelvis was performed using the standard protocol following bolus administration of intravenous contrast. CONTRAST:  130mL OMNIPAQUE IOHEXOL 300 MG/ML  SOLN COMPARISON:  CT the abdomen and pelvis 08/24/2019. Abdominal MRI 09/05/2019. FINDINGS: Lower chest: Unremarkable. Hepatobiliary:  Marked increase in number and size of what are now innumerable low-intermediate attenuation lesions scattered throughout the hepatic parenchyma, largest of which measure up to 3.6 x 2.4 cm in segment 4B of the liver (axial image 37 of series 3). Some of these lesions appear elongated within the hepatic parenchyma best appreciated on sagittal image 53 of series 7, where that length of the lesion previously described measures up to 11.6 cm. These lesions are exerting local mass effect upon adjacent structures causing compression of central bile ducts resulting in mild to moderate intrahepatic biliary ductal dilatation throughout the right lobe of the liver. Status post cholecystectomy is part of Whipple procedure. In the gallbladder fossa there is a large amount of intermediate attenuation fluid and/or soft tissue which has a thick rim of peripheral enhancement estimated to measure approximately 6.1 x 6.6 x 3.5 cm (axial image 43 of series 3 and coronal image 33 of series 6). This soft tissue appears intimately associated both with the undersurface of the liver, as well as several adjacent small bowel loops, in close proximity to the presumed location of the hepaticojejunostomy. Previously noted fluid collection beneath the right lobe of the liver (which previously had a percutaneous drainage catheter on prior CT 08/24/2019) is again noted on today's examination, currently measuring 5.7 x 5.3 x 6.9 cm (axial image 51 of series 3 and coronal image 34 of series 6) with thick peripheral rim of soft tissue and enhancement which appears to partially extend into the right oblique musculature best appreciated on axial image 47 of series 3 and coronal image 33 of series 6). Pancreas: Postoperative changes of Whipple procedure. Spleen: Unremarkable. Adrenals/Urinary Tract: 1.5 cm low-attenuation lesion in the upper pole of the right kidney, compatible with a simple cyst. Left  kidney and bilateral adrenal glands are normal in  appearance. No hydroureteronephrosis. Urinary bladder is normal in appearance. Stomach/Bowel: Postoperative changes of Whipple procedure. Loops of small bowel and colon traversing beneath the right lobe of the liver appear thickened as they come in close association with the previously noted fluid and/or soft tissue collection in this region. No pathologic dilatation of small bowel or colon. Normal appendix. Vascular/Lymphatic: Aortic atherosclerosis, without evidence of aneurysm or dissection in the abdominal or pelvic vasculature. Small of filling defect in the proximal portal vein (axial image 36 of series 3), concerning for nonobstructive thrombus. Enlarged lymph node immediately anterior to the superior mesenteric vein (axial image 43 of series 3) measuring 1.5 cm in short axis. Reproductive: Prostate gland and seminal vesicles are unremarkable in appearance. Other: Trace volume of perihepatic ascites.  No pneumoperitoneum. Musculoskeletal: There are no aggressive appearing lytic or blastic lesions noted in the visualized portions of the skeleton. IMPRESSION: 1. Marked increased number and size of innumerable hepatic lesions, as well as persistence of right infra hepatic postoperative fluid collection and development of an additional fluid and/or soft tissue collection beneath the gallbladder fossa. Overall, the findings are favored to reflect progressive metastatic disease to the liver, however, there is overlap with potential infectious etiologies (multiple intrahepatic abscesses and subhepatic abscesses). Guided sampling of one or more of these lesions is suggested to establish a definitive diagnosis. 2. 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 3. Interval development of mildly enlarged mesenteric lymph node measuring 1.5 cm in short axis immediately anterior to the portal vein, suspicious for a metastatic lymph node, although reactive lymphadenopathy in the setting of extensive infection  is not excluded. 4. Additional incidental findings, as above. Electronically Signed   By: Vinnie Langton M.D.   On: 10/10/2019 17:34   Dg Chest Port 1 View  Result Date: 10/11/2019 CLINICAL DATA:  [place]. Additional history provided: Syncopal episode, history of liver CA. EXAM: PORTABLE CHEST 1 VIEW COMPARISON:  CT angiogram chest 08/07/2019, chest radiograph 08/07/2011 FINDINGS: Left chest infusion port catheter with tip projecting in the region of the superior cavoatrial junction. Heart size within normal limits. No airspace consolidation within the lungs. No evidence of pleural effusion or pneumothorax. No acute bony abnormality. IMPRESSION: No evidence of acute cardiopulmonary abnormality. Electronically Signed   By: Kellie Simmering DO   On: 10/11/2019 07:57    EKG: Independently reviewed.   Assessment/Plan VALEK KURAMOTO is a 58 y.o. male with medical history significant of metastatic pancreatic cancer, T2DM, HLD, HTN, and Depression who presents with fevers/chills and sepsis possibly 2/2 hepatic abscess.  # Sepsis - Fever 103.9, Tachycardia and Tachypnea, Leukopenic, elevated lactate, and mild elevation in Creatinine - no definitive source but concern for hepatic abscess given re-accumulation of fluid collections on Abdominal CT.  Patient was hospitalized in October with blood cultures positive for Prevotella buccae at that time.  Dr. Benay Spice has been involved and has discussed with IR findings from CT A/P.  CT Chest without new findings.  Will follow-up recommendations but anticipate possible drain and ideally culture the fluid from the collections. - s/p volume, will continue to resuscitate, blood Cx obtained, COVID-19 PCR, UA pending - continue empiric treatment with Vanc, Cefepime and Metronidazole  # Metastatic pancreatic adenocarcinoma - follows with Dr. Learta Codding, care for cancer is not curative.  Worsening LFTs today c/w with hepatic mets.   - Patient was to begin Gemcitabine/Abraxane  but has been on hold given the above.  Patient's  surveillance imaging shows worsening hepatic mets in addition to the fluid collections noted - appreciate consultation and recommendations from Dr. Learta Codding  # AKI - pre-renal in setting of sepsis, anticipate improvement with fluid resuscitation - monitor I/O and renal function - avoid nephrotoxic agents  # T2DM - held oral agents and long acting insulin in setting of sepsis and hepatic mets, both of which prone to patient becoming hypoglycemic - will use sliding scale for now  # HTN - septic, hypotensive, held all oral agents  # Hx of DVT - held apixaban in anticipation of possible placement of drain, IR procedure  # Tobacco abuse - current 5 pack per week smoker, given acute presentation, extensive time was not spent counseling use at this time  DVT prophylaxis: had been on apixaban, held for IR procedure Code Status: Full Code Family Communication: Wife at bedside and updated Disposition Plan: pending Consults called: Dr. Learta Codding, GI Onc, who is coordinating with IR Admission status: SDU   Truddie Hidden MD [facility] Pager [phone]  If 7PM-7AM, please contact night-coverage [email or web address] Password [email or web address]  10/11/2019, 10:58 AM

## 2019-10-11 NOTE — Progress Notes (Signed)
Referring Physician(s): Sherrill,B  Supervising Physician: Corrie Mckusick  Patient Status:  [name] - In-pt  Chief Complaint: Metastatic pancreatic cancer, recent fever, chills, presyncopal episode, jaundice   Subjective: Patient familiar to IR service from right upper quadrant drain placement on 08/09/2019 and removal on 08/24/2019 (following negative injection study) as well as right liver lesion biopsy on 09/26/2019.  He has a history of metastatic pancreatic carcinoma, status post Whipple on 07/20/2019.  He now presents to [name] with recent fever/chills/presyncopal episode, jaundice.  He is COVID-19 negative.  WBC 3.1, hemoglobin 12.9, platelets 169K, and 1.3, PT 21.5, INR 1.9, total bilirubin 6.8.  Latest CT scan of abdomen pelvis yesterday revealed:  1. Marked increased number and size of innumerable hepatic lesions, as well as persistence of right infra hepatic postoperative fluid collection and development of an additional fluid and/or soft tissue collection beneath the gallbladder fossa. Overall, the findings are favored to reflect progressive metastatic disease to the liver, however, there is overlap with potential infectious etiologies (multiple intrahepatic abscesses and subhepatic abscesses). Guided sampling of one or more of these lesions is suggested to establish a definitive diagnosis. 2. 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 3. Interval development of mildly enlarged mesenteric lymph node measuring 1.5 cm in short axis immediately anterior to the portal vein, suspicious for a metastatic lymph node, although reactive lymphadenopathy in the setting of extensive infection is not Excluded.  CT angio chest done today revealed no acute PE or acute pulmonary parenchymal findings.  Innumerable hepatic metastases noted.  He is on Eliquis for prior left lower extremity DVT with last dose yesterday evening.  Request now received from oncology for  image guided aspiration/drainage of right upper quadrant fluid collection at site of previous drain.  He currently denies headache, chest pain, worsening dyspnea, cough, nausea, vomiting or bleeding .  He does have some back pain.  Additional medical history as below.  Past Medical History:  Diagnosis Date   Cancer ([name]) 12/2018   pancreatic cancer   Depression    Diabetes mellitus without complication ([name])    type 2   Dyslipidemia    Erectile dysfunction    Family history of breast cancer    Family history of melanoma    Hypertension    white coat syndrome   Nicotine addiction    Past Surgical History:  Procedure Laterality Date   BILIARY STENT PLACEMENT N/A 12/21/2018   Procedure: BILIARY STENT PLACEMENT;  Surgeon: Clarene Essex, MD;  Location: [facility];  Service: Endoscopy;  Laterality: N/A;   BILIARY STENT PLACEMENT N/A 01/24/2019   Procedure: BILIARY STENT PLACEMENT;  Surgeon: Arta Silence, MD;  [facility]: [facility];  Service: Endoscopy;  Laterality: N/A;   BIOPSY  12/21/2018   Procedure: BIOPSY;  Surgeon: Clarene Essex, MD;  [facility];  Service: Endoscopy;;   ENDOSCOPIC RETROGRADE CHOLANGIOPANCREATOGRAPHY (ERCP) WITH PROPOFOL N/A 12/21/2018   Procedure: ENDOSCOPIC RETROGRADE CHOLANGIOPANCREATOGRAPHY (ERCP) WITH PROPOFOL;  Surgeon: Clarene Essex, MD;  [facility];  Service: Endoscopy;  Laterality: N/A;   ENDOSCOPIC RETROGRADE CHOLANGIOPANCREATOGRAPHY (ERCP) WITH PROPOFOL N/A 01/24/2019   Procedure: ENDOSCOPIC RETROGRADE CHOLANGIOPANCREATOGRAPHY (ERCP) WITH PROPOFOL;  Surgeon: Arta Silence, MD;  [facility];  Service: Endoscopy;  Laterality: N/A;  Balloon Sweep of Duct   ESOPHAGOGASTRODUODENOSCOPY N/A 01/03/2019   Procedure: ESOPHAGOGASTRODUODENOSCOPY (EGD);  Surgeon: Arta Silence, MD;  Location: [name];  Service: Endoscopy;  Laterality: N/A;   ESOPHAGOGASTRODUODENOSCOPY (EGD) WITH PROPOFOL N/A 01/24/2019   Procedure:  ESOPHAGOGASTRODUODENOSCOPY (EGD)  WITH PROPOFOL;  Surgeon: Arta Silence, MD;  [facility];  Service: Endoscopy;  Laterality: N/A;  stent removal   EUS N/A 01/03/2019   Procedure: FULL UPPER ENDOSCOPIC ULTRASOUND (EUS) Linear not radial;  Surgeon: Arta Silence, MD;  [facility];  Service: Endoscopy;  Laterality: N/A;   EUS N/A 01/24/2019   Procedure: FULL UPPER ENDOSCOPIC ULTRASOUND (EUS) RADIAL;  Surgeon: Arta Silence, MD;  [facility];  Service: Endoscopy;  Laterality: N/A;   FINE NEEDLE ASPIRATION N/A 01/03/2019   Procedure: FINE NEEDLE ASPIRATION (FNA) LINEAR;  Surgeon: Arta Silence, MD;  [facility];  Service: Endoscopy;  Laterality: N/A;   FINE NEEDLE ASPIRATION N/A 01/24/2019   Procedure: FINE NEEDLE ASPIRATION (FNA) LINEAR;  Surgeon: Arta Silence, MD;  [facility];  Service: Endoscopy;  Laterality: N/A;   IR RADIOLOGIST EVAL & MGMT  08/24/2019   LAPAROSCOPY N/A 07/20/2019   Procedure: LAPAROSCOPY DIAGNOSTIC;  Surgeon: [name], MD;  Location: [name];  Service: General;  Laterality: N/A;   LOWER EXTREMITY ANGIOGRAPHY N/A 10/18/2017   Procedure: LOWER EXTREMITY ANGIOGRAPHY - IVUS - LYSIS CATH;  Surgeon: Waynetta Sandy, MD;  Location: [name];  Service: Cardiovascular;  Laterality: N/A;   LOWER EXTREMITY ANGIOGRAPHY N/A 10/19/2017   Procedure: LOWER EXTREMITY ANGIOGRAPHY - LYSIS RECHECK;  Surgeon: Waynetta Sandy, MD;  Location: [name];  Service: Cardiovascular;  Laterality: N/A;   PANCREATIC STENT PLACEMENT  12/21/2018   Procedure: PANCREATIC STENT PLACEMENT;  Surgeon: Clarene Essex, MD;  [facility];  Service: Endoscopy;;   PEG PLACEMENT N/A 07/20/2019   Procedure: FEEDING TUBE;  Surgeon: [name], MD;  Location: [name];  Service: General;  Laterality: N/A;   PERIPHERAL VASCULAR BALLOON ANGIOPLASTY Left 10/19/2017   Procedure: PERIPHERAL VASCULAR BALLOON  ANGIOPLASTY;  Surgeon: Waynetta Sandy, MD;  Location: [name];  Service: Cardiovascular;  Laterality: Left;  Multiple Lower extremity venous sites   PERIPHERAL VASCULAR INTERVENTION Left 10/18/2017   Procedure: PERIPHERAL VASCULAR INTERVENTION;  Surgeon: Waynetta Sandy, MD;  Location: [place] [facility];  Service: Cardiovascular;  Laterality: Left;   PORTACATH PLACEMENT Left 02/02/2019   Procedure: INSERTION PORT-A-CATH WITH POSSIBLE ULTRASOUND;  Surgeon: [name], MD;  Location: [place];  Service: General;  Laterality: Left;   REMOVAL OF STONES  12/21/2018   Procedure: REMOVAL OF STONES;  Surgeon: Clarene Essex, MD;  [facility];  Service: Endoscopy;;   SPHINCTEROTOMY  12/21/2018   Procedure: Joan Mayans;  Surgeon: Clarene Essex, MD;  [facility];  Service: Endoscopy;;   STENT REMOVAL  01/24/2019   Procedure: STENT REMOVAL;  Surgeon: Arta Silence, MD;  [facility];  Service: Endoscopy;;   WHIPPLE PROCEDURE N/A 07/20/2019   Procedure: WHIPPLE PROCEDURE;  Surgeon: [name], MD;  Location: [place];  Service: General;  Laterality: N/A;      Allergies: Patient has no known allergies.  Medications: Prior to Admission medications   Medication Sig Start Date End Date Taking? Authorizing Provider  acetaminophen (TYLENOL) 500 MG tablet Take 1,000 mg by mouth every 6 (six) hours as needed for moderate pain or headache.   Yes [provider]  apixaban (ELIQUIS) 5 MG TABS tablet Take 1 tablet (5 mg total) by mouth 2 (two) times daily. 01/04/19  Yes Arta Silence, MD  b complex vitamins tablet Take 1 tablet by mouth daily.   Yes [provider]  CINNAMON PO Take 1,000 mg by mouth 2 (two)  times daily.   Yes [provider]  Coenzyme Q10 (COQ-10 PO) Take 1 tablet by mouth daily.   Yes [provider]  CREON 36000 units CPEP capsule Take by mouth. Takes #2 with meals and #1  with snack 09/30/19  Yes [provider]  famotidine (PEPCID AC) 10 MG tablet Take 10 mg by mouth 2 (two) times daily as needed for heartburn or indigestion.   Yes [provider]  ferrous sulfate 325 (65 FE) MG EC tablet Take 325 mg by mouth 2 (two) times a day.    Yes [provider]  glimepiride (AMARYL) 4 MG tablet Take 4 mg by mouth daily with breakfast.  10/13/17  Yes [provider]  HUMALOG KWIKPEN 100 UNIT/ML KwikPen Inject 3-13 Units into the skin 2 (two) times daily as needed (if bs is over 150).  06/29/19  Yes [provider]  Insulin Glargine (BASAGLAR KWIKPEN) 100 UNIT/ML SOPN Inject 19 Units into the skin at bedtime.  10/19/18  Yes [provider]  Lidocaine (HM LIDOCAINE PATCH) 4 % PTCH Apply 1 patch topically at bedtime.   Yes [provider]  lidocaine-prilocaine (EMLA) cream Apply 1 application topically as needed (port access). 10/09/19  Yes Ladell Pier, MD  lisinopril (PRINIVIL,ZESTRIL) 10 MG tablet Take 10 mg by mouth daily. 10/13/17  Yes [provider]  LORazepam (ATIVAN) 0.5 MG tablet Take 1 tablet (0.5 mg total) by mouth every 8 (eight) hours as needed (for nausea). 05/01/19  Yes Ladell Pier, MD  Melatonin 10 MG TABS Take 20 mg by mouth at bedtime.   Yes [provider]  metFORMIN (GLUCOPHAGE) 1000 MG tablet Take 1,000 mg by mouth 2 (two) times daily. 10/13/17  Yes [provider]  methocarbamol (ROBAXIN) 500 MG tablet Take 1 tablet (500 mg total) by mouth every 6 (six) hours as needed for muscle spasms. 07/28/19  Yes [name], MD  Omega-3 Fatty Acids (FISH OIL) 1000 MG CAPS Take 1,000 mg by mouth 2 (two) times daily.    Yes [provider]  ondansetron (ZOFRAN-ODT) 4 MG disintegrating tablet Take 1 tablet (4 mg total) by mouth every 6 (six) hours as needed for nausea. 10/02/19  Yes Ladell Pier, MD  oxyCODONE (OXY IR/ROXICODONE) 5 MG immediate release tablet Take  1 tablet (5 mg total) by mouth every 6 (six) hours as needed for moderate pain, severe pain or breakthrough pain. 10/02/19  Yes Ladell Pier, MD  pioglitazone (ACTOS) 45 MG tablet Take 45 mg by mouth daily. 10/13/17  Yes [provider]  prochlorperazine (COMPAZINE) 10 MG tablet Take 1 tablet (10 mg total) by mouth every 6 (six) hours as needed for nausea or vomiting. 04/14/19  Yes Ladell Pier, MD     Vital Signs: BP 103/70 (BP Location: Left Arm)    Pulse 72    Temp 98.6 F (37 C) (Oral)    Resp 20    SpO2 100%   Physical Exam awake, alert.  Chest clear to auscultation bilaterally.  Left chest Port-A-Cath in place;  heart with regular rate/ rhythm.  Abdomen soft, midline vertical wound clean and dry, positive bowel sounds, slightly distended, not significantly tender.  No lower extremity edema.  Imaging: Ct Angio Chest Pe W And/or Wo Contrast  Result Date: 10/11/2019 CLINICAL DATA:  73ml omni350 Pt had whipple procedure done in sept 2020. Pt was to start chemo today. Bilirubin level is going up. Denies any chest pain or chest  pressure. Pt tates has a rapid heart rate. eval for pE EXAM: CT ANGIOGRAPHY CHEST WITH CONTRAST TECHNIQUE: Multidetector CT imaging of the chest was performed using the standard protocol during bolus administration of intravenous contrast. Multiplanar CT image reconstructions and MIPs were obtained to evaluate the vascular anatomy. CONTRAST:  16mL OMNIPAQUE IOHEXOL 350 MG/ML SOLN COMPARISON:  CT abdomen pelvis 10/10/2019 FINDINGS: Cardiovascular: No filling defects within the pulmonary arteries to suggest acute pulmonary embolism. No acute findings of the aorta or great vessels. No pericardial fluid. Mediastinum/Nodes: No axillary supraclavicular adenopathy. No mediastinal hilar adenopathy. No pericardial effusion. Port in the anterior chest wall with tip in distal SVC. Lungs/Pleura: No suspicious pulmonary nodules. No infiltrate Upper Abdomen: Innumerable  hepatic metastasis again noted. Musculoskeletal: No aggressive osseous lesion Review of the MIP images confirms the above findings. IMPRESSION: 1. No evidence acute pulmonary embolism. 2. No acute pulmonary parenchymal findings. 3. Innumerable hepatic metastasis. Electronically Signed   By: Suzy Bouchard M.D.   On: 10/11/2019 09:50   Ct Abdomen Pelvis W Contrast  Result Date: 10/10/2019 CLINICAL DATA:  58 year old male with history of pancreatic cancer diagnosed in February 2020. Elevated bilirubin. Evaluate for potential biliary obstruction. EXAM: CT ABDOMEN AND PELVIS WITH CONTRAST TECHNIQUE: Multidetector CT imaging of the abdomen and pelvis was performed using the standard protocol following bolus administration of intravenous contrast. CONTRAST:  162mL OMNIPAQUE IOHEXOL 300 MG/ML  SOLN COMPARISON:  CT the abdomen and pelvis 08/24/2019. Abdominal MRI 09/05/2019. FINDINGS: Lower chest: Unremarkable. Hepatobiliary: Marked increase in number and size of what are now innumerable low-intermediate attenuation lesions scattered throughout the hepatic parenchyma, largest of which measure up to 3.6 x 2.4 cm in segment 4B of the liver (axial image 37 of series 3). Some of these lesions appear elongated within the hepatic parenchyma best appreciated on sagittal image 53 of series 7, where that length of the lesion previously described measures up to 11.6 cm. These lesions are exerting local mass effect upon adjacent structures causing compression of central bile ducts resulting in mild to moderate intrahepatic biliary ductal dilatation throughout the right lobe of the liver. Status post cholecystectomy is part of Whipple procedure. In the gallbladder fossa there is a large amount of intermediate attenuation fluid and/or soft tissue which has a thick rim of peripheral enhancement estimated to measure approximately 6.1 x 6.6 x 3.5 cm (axial image 43 of series 3 and coronal image 33 of series 6). This soft tissue  appears intimately associated both with the undersurface of the liver, as well as several adjacent small bowel loops, in close proximity to the presumed location of the hepaticojejunostomy. Previously noted fluid collection beneath the right lobe of the liver (which previously had a percutaneous drainage catheter on prior CT 08/24/2019) is again noted on today's examination, currently measuring 5.7 x 5.3 x 6.9 cm (axial image 51 of series 3 and coronal image 34 of series 6) with thick peripheral rim of soft tissue and enhancement which appears to partially extend into the right oblique musculature best appreciated on axial image 47 of series 3 and coronal image 33 of series 6). Pancreas: Postoperative changes of Whipple procedure. Spleen: Unremarkable. Adrenals/Urinary Tract: 1.5 cm low-attenuation lesion in the upper pole of the right kidney, compatible with a simple cyst. Left kidney and bilateral adrenal glands are normal in appearance. No hydroureteronephrosis. Urinary bladder is normal in appearance. Stomach/Bowel: Postoperative changes of Whipple procedure. Loops of small bowel and colon traversing beneath the right lobe of the liver appear thickened  as they come in close association with the previously noted fluid and/or soft tissue collection in this region. No pathologic dilatation of small bowel or colon. Normal appendix. Vascular/Lymphatic: Aortic atherosclerosis, without evidence of aneurysm or dissection in the abdominal or pelvic vasculature. Small of filling defect in the proximal portal vein (axial image 36 of series 3), concerning for nonobstructive thrombus. Enlarged lymph node immediately anterior to the superior mesenteric vein (axial image 43 of series 3) measuring 1.5 cm in short axis. Reproductive: Prostate gland and seminal vesicles are unremarkable in appearance. Other: Trace volume of perihepatic ascites.  No pneumoperitoneum. Musculoskeletal: There are no aggressive appearing lytic or  blastic lesions noted in the visualized portions of the skeleton. IMPRESSION: 1. Marked increased number and size of innumerable hepatic lesions, as well as persistence of right infra hepatic postoperative fluid collection and development of an additional fluid and/or soft tissue collection beneath the gallbladder fossa. Overall, the findings are favored to reflect progressive metastatic disease to the liver, however, there is overlap with potential infectious etiologies (multiple intrahepatic abscesses and subhepatic abscesses). Guided sampling of one or more of these lesions is suggested to establish a definitive diagnosis. 2. 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 3. Interval development of mildly enlarged mesenteric lymph node measuring 1.5 cm in short axis immediately anterior to the portal vein, suspicious for a metastatic lymph node, although reactive lymphadenopathy in the setting of extensive infection is not excluded. 4. Additional incidental findings, as above. Electronically Signed   By: Vinnie Langton M.D.   On: 10/10/2019 17:34   Dg Chest Port 1 View  Result Date: 10/11/2019 CLINICAL DATA:  [place]. Additional history provided: Syncopal episode, history of liver CA. EXAM: PORTABLE CHEST 1 VIEW COMPARISON:  CT angiogram chest 08/07/2019, chest radiograph 08/07/2011 FINDINGS: Left chest infusion port catheter with tip projecting in the region of the superior cavoatrial junction. Heart size within normal limits. No airspace consolidation within the lungs. No evidence of pleural effusion or pneumothorax. No acute bony abnormality. IMPRESSION: No evidence of acute cardiopulmonary abnormality. Electronically Signed   By: Kellie Simmering DO   On: 10/11/2019 07:57    Labs:  CBC: Recent Labs    08/11/19 0553 09/26/19 1205 10/09/19 1024 10/11/19 0710  WBC 6.7 8.1 13.0* 3.1*  HGB 8.7* 9.6* 8.8* 12.9*  HCT 26.7* 29.0* 26.6* 39.8  PLT 195 225 268 169    COAGS: Recent Labs     12/22/18 0159 12/23/18 0014 07/12/19 0959 07/20/19 1610 09/26/19 1205 10/11/19 0710  INR  --   --  1.0 1.1 1.2 1.9*  APTT 80* 65*  --   --  53* 36    BMP: Recent Labs    08/10/19 0500 08/11/19 0553 10/09/19 1024 10/11/19 0710  NA 135 135 131* 130*  K 3.6 3.8 5.1 4.1  CL 104 101 96* 96*  CO2 24 23 25  21*  GLUCOSE 106* 111* 245* 170*  BUN 6 <5* 21* 32*  CALCIUM 7.9* 8.2* 8.7* 8.3*  CREATININE 0.87 0.94 1.09 1.30*  GFRNONAA >60 >60 >60 60*  GFRAA >60 >60 >60 >60    LIVER FUNCTION TESTS: Recent Labs    08/07/19 1526 08/09/19 0923 10/09/19 1024 10/11/19 0710  BILITOT 0.7 0.3 5.0* 6.8*  AST 27 18 120* 98*  ALT 23 19 81* 72*  ALKPHOS 262* 210* 1,292* 894*  PROT 6.3* 6.0* 7.3 6.7  ALBUMIN 2.6* 2.2* 2.2* 2.1*    Assessment and Plan: Pt with history of metastatic pancreatic  carcinoma, status post Whipple on 07/20/2019.  Status post right upper quadrant fluid collection drain on 08/09/2019 with removal on 08/24/2019.  Drain fluid cultures with multiple organisms at the time.  He now presents to [facility] with recent fever/chills/presyncopal episode, jaundice.  He is COVID-19 negative.  WBC 3.1, hemoglobin 12.9, platelets 169K, and 1.3, PT 21.5, INR 1.9, total bilirubin 6.8.  Lactic acid 3.1, blood cultures negative to date.  Latest CT scan of abdomen pelvis yesterday revealed:  1. Marked increased number and size of innumerable hepatic lesions, as well as persistence of right infra hepatic postoperative fluid collection and development of an additional fluid and/or soft tissue collection beneath the gallbladder fossa. Overall, the findings are favored to reflect progressive metastatic disease to the liver, however, there is overlap with potential infectious etiologies (multiple intrahepatic abscesses and subhepatic abscesses). Guided sampling of one or more of these lesions is suggested to establish a definitive diagnosis. 2. Small nonobstructive thrombus in the  proximal portal vein. 3. Interval development of mildly enlarged mesenteric lymph node measuring 1.5 cm in short axis immediately anterior to the portal vein, suspicious for a metastatic lymph node, although reactive lymphadenopathy in the setting of extensive infection is not Excluded.  CT angio chest done today revealed no acute PE or acute pulmonary parenchymal findings.  Innumerable hepatic metastases noted.  He is on Eliquis for prior left lower extremity DVT with last dose yesterday evening.  Request now received from oncology for image guided aspiration/drainage of right upper quadrant fluid collection at site of previous drain.  Imaging studies were reviewed by Dr. Anselm Pancoast.  Plan at this time is for CT-guided aspiration/possible drainage of right upper quadrant abd fluid collection on 12/10 AM.  Will defer on biliary drain placement at this time until further evaluation of right upper quadrant fluid.  Details/risks of procedure, including but not limited to, internal bleeding, infection, injury to adjacent structures discussed with patient and spouse with their understanding and consent.   Electronically Signed: D. Rowe Robert, PA-C 10/11/2019, 5:05 PM   I spent a total of 25 minutes at the the patient's bedside AND on the patient's hospital floor or unit, greater than 50% of which was counseling/coordinating care for CT-guided aspiration/possible drainage of right upper quadrant abdominal fluid collection    Patient ID: Brian Torres, male   DOB: 05/03/1961, 58 y.o.   MRN: [id number]

## 2019-10-11 NOTE — Progress Notes (Signed)
Pharmacy Antibiotic Note  Brian Torres is a 58 y.o. male admitted on 10/11/2019 with sepsis.  Pharmacy has been consulted for Vancomycin & Cefepime dosing, Flagyl per MD  Plan: Cefepime 2gm q8 Vancomycin 1500mg  x1, then 1gm q12 for AUC 526, using TBW, SCr 1.3 Flagyl 500mg  IV q8    Temp (24hrs), Avg:101.3 F (38.5 C), Min:98.6 F (37 C), Max:103.9 F (39.9 C)  Recent Labs  Lab 10/09/19 1024 10/11/19 0710 10/11/19 0910  WBC 13.0* 3.1*  --   CREATININE 1.09 1.30*  --   LATICACIDVEN  --  5.2* 3.1*    Estimated Creatinine Clearance: 71.6 mL/min (A) (by C-G formula based on SCr of 1.3 mg/dL (H)).    No Known Allergies  Antimicrobials this admission: 12/9 Cefepime >>  12/9 Vancomycin >>  12/9 Flagyl >>  Dose adjustments this admission:  Microbiology results: 12/9 BCx: sent  UCx: ordered  12/9 Flu PCR: neg/neg 12/9 Covid: neg  Thank you for allowing pharmacy to be a part of this patient's care.  Minda Ditto PharmD 10/11/2019 12:27 PM

## 2019-10-11 NOTE — ED Notes (Signed)
No urine sample provided at this time. Advised wife at bedside to use call light to advise [place] if and when he does urinate so we can collect urine sample.

## 2019-10-11 NOTE — ED Notes (Signed)
Pt attempted to void w/ no success, will continue to monitor.

## 2019-10-11 NOTE — ED Provider Notes (Signed)
Emergency Department Provider Note   I have reviewed the triage vital signs and the nursing notes.   HISTORY  Chief Complaint No chief complaint on file.   HPI Brian Torres is a 58 y.o. male currently under the care of Dr. Benay Spice for metastatic pancreatic cancer with rising bilirubin returns to the emergency department with syncope, tachycardia, and chills.  Patient tells me that "my wife is over reacting" when asked why he is here.  He states that he had shaking and chills and "went down" but does not feel like he passed out.  He denies falling or any head trauma.  He is not experiencing any chest pain or shortness of breath.  He states his abdominal pain is not significantly worse.  He denies vomiting but has had some nonbloody/nonblack diarrhea.  No sick contacts.  In review of his most recent visit with oncology he was due to start gemcitabine/abraxane 2 days prior but was sent for urgent CT abdomen pelvis which was done yesterday.  That showed worsening metastatic disease and could not rule out multiple intra-abdominal abscesses. Dr. Barry Dienes performed pancreaticoduodenectomy on 07/20/2019.    Past Medical History:  Diagnosis Date   Cancer ([place]) 12/2018   pancreatic cancer   Depression    Diabetes mellitus without complication ([place])    type 2   Dyslipidemia    Erectile dysfunction    Family history of breast cancer    Family history of melanoma    Hypertension    white coat syndrome   Nicotine addiction     Patient Active Problem List   Diagnosis Date Noted   Goals of care, counseling/discussion 10/02/2019   Postprocedural intraabdominal abscess 08/07/2019   Sepsis ([place]) 08/07/2019   Hypokalemia 08/07/2019   History of deep vein thrombosis (DVT) of lower extremity 08/07/2019   Pancreatic cancer ([place]) 07/20/2019   Adenocarcinoma of head of pancreas ([place]) 07/20/2019   Genetic testing 05/16/2019   Family history of melanoma    Family history of  breast cancer    Port-A-Cath in place 02/20/2019   Cancer of head of pancreas ([place]) 01/26/2019   Pancreatic mass 12/19/2018   Tobacco abuse 12/19/2018   Elevated LFTs 12/19/2018   Hyponatremia 12/19/2018   Anemia 10/19/2017   Thrombocytopenia ([place]) 10/19/2017   Leg DVT (deep venous thromboembolism), acute, left ([place]) 10/15/2017   DM2 (diabetes mellitus, type 2) ([place]) 10/15/2017   HTN (hypertension) 10/15/2017   CKD stage 3 due to type 2 diabetes mellitus ([employer]) 10/15/2017    Past Surgical History:  Procedure Laterality Date   BILIARY STENT PLACEMENT N/A 12/21/2018   Procedure: BILIARY STENT PLACEMENT;  Surgeon: Clarene Essex, MD;  [facility];  Service: Endoscopy;  Laterality: N/A;   BILIARY STENT PLACEMENT N/A 01/24/2019   Procedure: BILIARY STENT PLACEMENT;  Surgeon: Arta Silence, MD;  [facility];  Service: Endoscopy;  Laterality: N/A;   BIOPSY  12/21/2018   Procedure: BIOPSY;  Surgeon: Clarene Essex, MD;  [facility];  Service: Endoscopy;;   ENDOSCOPIC RETROGRADE CHOLANGIOPANCREATOGRAPHY (ERCP) WITH PROPOFOL N/A 12/21/2018   Procedure: ENDOSCOPIC RETROGRADE CHOLANGIOPANCREATOGRAPHY (ERCP) WITH PROPOFOL;  Surgeon: Clarene Essex, MD;  [facility];  Service: Endoscopy;  Laterality: N/A;   ENDOSCOPIC RETROGRADE CHOLANGIOPANCREATOGRAPHY (ERCP) WITH PROPOFOL N/A 01/24/2019   Procedure: ENDOSCOPIC RETROGRADE CHOLANGIOPANCREATOGRAPHY (ERCP) WITH PROPOFOL;  Surgeon: Arta Silence, MD;  [facility];  Service: Endoscopy;  Laterality: N/A;  Balloon Sweep of Duct   ESOPHAGOGASTRODUODENOSCOPY N/A 01/03/2019   Procedure: ESOPHAGOGASTRODUODENOSCOPY (EGD);  Surgeon: Arta Silence, MD;  [name];  Service: Endoscopy;  Laterality: N/A;   ESOPHAGOGASTRODUODENOSCOPY (EGD) WITH PROPOFOL N/A 01/24/2019   Procedure: ESOPHAGOGASTRODUODENOSCOPY (EGD) WITH PROPOFOL;  Surgeon: Arta Silence, MD;  [facility];  Service:  Endoscopy;  Laterality: N/A;  stent removal   EUS N/A 01/03/2019   Procedure: FULL UPPER ENDOSCOPIC ULTRASOUND (EUS) Linear not radial;  Surgeon: Arta Silence, MD;  [facility];  Service: Endoscopy;  Laterality: N/A;   EUS N/A 01/24/2019   Procedure: FULL UPPER ENDOSCOPIC ULTRASOUND (EUS) RADIAL;  Surgeon: Arta Silence, MD;  [facility];  Service: Endoscopy;  Laterality: N/A;   FINE NEEDLE ASPIRATION N/A 01/03/2019   Procedure: FINE NEEDLE ASPIRATION (FNA) LINEAR;  Surgeon: Arta Silence, MD;  [facility];  Service: Endoscopy;  Laterality: N/A;   FINE NEEDLE ASPIRATION N/A 01/24/2019   Procedure: FINE NEEDLE ASPIRATION (FNA) LINEAR;  Surgeon: Arta Silence, MD;  [facility];  Service: Endoscopy;  Laterality: N/A;   IR RADIOLOGIST EVAL & MGMT  08/24/2019   LAPAROSCOPY N/A 07/20/2019   Procedure: LAPAROSCOPY DIAGNOSTIC;  Surgeon: [name], MD;  Location: [place];  Service: General;  Laterality: N/A;   LOWER EXTREMITY ANGIOGRAPHY N/A 10/18/2017   Procedure: LOWER EXTREMITY ANGIOGRAPHY - IVUS - LYSIS CATH;  Surgeon: Waynetta Sandy, MD;  Location: [place] [facility];  Service: Cardiovascular;  Laterality: N/A;   LOWER EXTREMITY ANGIOGRAPHY N/A 10/19/2017   Procedure: LOWER EXTREMITY ANGIOGRAPHY - LYSIS RECHECK;  Surgeon: Waynetta Sandy, MD;  Location: [place] [facility];  Service: Cardiovascular;  Laterality: N/A;   PANCREATIC STENT PLACEMENT  12/21/2018   Procedure: PANCREATIC STENT PLACEMENT;  Surgeon: Clarene Essex, MD;  [facility];  Service: Endoscopy;;   PEG PLACEMENT N/A 07/20/2019   Procedure: FEEDING TUBE;  Surgeon: [name], MD;  Location: [place];  Service: General;  Laterality: N/A;   PERIPHERAL VASCULAR BALLOON ANGIOPLASTY Left 10/19/2017   Procedure: PERIPHERAL VASCULAR BALLOON ANGIOPLASTY;  Surgeon: Waynetta Sandy, MD;  Location: [place] [facility];  Service: Cardiovascular;   Laterality: Left;  Multiple Lower extremity venous sites   PERIPHERAL VASCULAR INTERVENTION Left 10/18/2017   Procedure: PERIPHERAL VASCULAR INTERVENTION;  Surgeon: Waynetta Sandy, MD;  Location: [place] [facility];  Service: Cardiovascular;  Laterality: Left;   PORTACATH PLACEMENT Left 02/02/2019   Procedure: INSERTION PORT-A-CATH WITH POSSIBLE ULTRASOUND;  Surgeon: [name], MD;  Location: [place];  Service: General;  Laterality: Left;   REMOVAL OF STONES  12/21/2018   Procedure: REMOVAL OF STONES;  Surgeon: Clarene Essex, MD;  [facility];  Service: Endoscopy;;   SPHINCTEROTOMY  12/21/2018   Procedure: Joan Mayans;  Surgeon: Clarene Essex, MD;  [facility];  Service: Endoscopy;;   STENT REMOVAL  01/24/2019   Procedure: STENT REMOVAL;  Surgeon: Arta Silence, MD;  [facility];  Service: Endoscopy;;   WHIPPLE PROCEDURE N/A 07/20/2019   Procedure: WHIPPLE PROCEDURE;  Surgeon: [name], MD;  Location: [place];  Service: General;  Laterality: N/A;    Allergies Patient has no known allergies.  Family History  Problem Relation Age of Onset   Diabetes Mother    Diabetes Father    CAD Father    Melanoma Father    Breast cancer Paternal Aunt 74   COPD Maternal Aunt    Melanoma Paternal Uncle    Stroke Maternal Grandmother    Tuberculosis Paternal Grandmother    Cancer Neg Hx     Social History  Social History   Tobacco Use   Smoking status: Current Every Day Smoker    Packs/day: 1.00    Types: Cigarettes   Smokeless tobacco: Never Used  Substance Use Topics   Alcohol use: No    Frequency: Never   Drug use: No    Review of Systems  Constitutional: No fever. Positive chills.  Eyes: No visual changes. ENT: No sore throat. Cardiovascular: Denies chest pain. Question syncope.  Respiratory: Denies shortness of breath. Gastrointestinal: Baseline abdominal pain.  No nausea, no vomiting. Positive  diarrhea.  No constipation. Genitourinary: Negative for dysuria. Musculoskeletal: Negative for back pain. Skin: Negative for rash.  Neurological: Negative for headaches, focal weakness or numbness.  10-point ROS otherwise negative.  ____________________________________________   PHYSICAL EXAM:  VITAL SIGNS: Vitals:   10/11/19 0800 10/11/19 0830  BP: (!) 96/45 (!) 106/57  Pulse: (!) 121 (!) 127  Resp: (!) 27   Temp:    SpO2: 99% 98%    Constitutional: Slightly somnolent but able to provide a history.  Eyes: Conjunctivae are normal. Head: Atraumatic. Nose: No congestion/rhinnorhea. Mouth/Throat: Mucous membranes are moist. Neck: No stridor.   Cardiovascular: Tachycardia. Good peripheral circulation. Grossly normal heart sounds.   Respiratory: Normal respiratory effort.  No retractions. Lungs CTAB. Gastrointestinal: Soft and nontender. No distention. Brown stool. No melena.  Musculoskeletal: No lower extremity tenderness nor edema. No gross deformities of extremities. Neurologic:  Normal speech and language. No gross focal neurologic deficits are appreciated.  Skin:  Skin is warm, dry and intact. No rash noted. Jaundice noted.    ____________________________________________   LABS (all labs ordered are listed, but only abnormal results are displayed)  Labs Reviewed  LACTIC ACID, PLASMA - Abnormal; Notable for the following components:      Result Value   Lactic Acid, Venous 5.2 (*)    All other components within normal limits  LACTIC ACID, PLASMA - Abnormal; Notable for the following components:   Lactic Acid, Venous 3.1 (*)    All other components within normal limits  COMPREHENSIVE METABOLIC PANEL - Abnormal; Notable for the following components:   Sodium 130 (*)    Chloride 96 (*)    CO2 21 (*)    Glucose, Bld 170 (*)    BUN 32 (*)    Creatinine, Ser 1.30 (*)    Calcium 8.3 (*)    Albumin 2.1 (*)    AST 98 (*)    ALT 72 (*)    Alkaline Phosphatase 894 (*)     Total Bilirubin 6.8 (*)    GFR calc non Af Amer 60 (*)    All other components within normal limits  CBC WITH DIFFERENTIAL/PLATELET - Abnormal; Notable for the following components:   WBC 3.1 (*)    Hemoglobin 12.9 (*)    RDW 18.6 (*)    Lymphs Abs 0.1 (*)    Monocytes Absolute 0.0 (*)    All other components within normal limits  PROTIME-INR - Abnormal; Notable for the following components:   Prothrombin Time 21.5 (*)    INR 1.9 (*)    All other components within normal limits  AMMONIA - Abnormal; Notable for the following components:   Ammonia 40 (*)    All other components within normal limits  CULTURE, BLOOD (ROUTINE X 2)  CULTURE, BLOOD (ROUTINE X 2)  RESPIRATORY PANEL BY RT PCR (FLU A&B, COVID)  URINE CULTURE  APTT  LIPASE, BLOOD  URINALYSIS, ROUTINE W REFLEX MICROSCOPIC   ____________________________________________  EKG  EKG Interpretation  Date/Time:  Wednesday October 11 2019 07:01:33 EST Ventricular Rate:  139 PR Interval:    QRS Duration: 81 QT Interval:  278 QTC Calculation: 423 R Axis:   75 Text Interpretation: Sinus tachycardia Aberrant complex Probable left atrial enlargement No STEMI Confirmed by Nanda Quinton [phone]) on 10/11/2019 7:44:51 AM       ____________________________________________  RADIOLOGY  Ct Angio Chest Pe W And/or Wo Contrast  Result Date: 10/11/2019 CLINICAL DATA:  3ml [id number] Pt had whipple procedure done in sept 2020. Pt was to start chemo today. Bilirubin level is going up. Denies any chest pain or chest pressure. Pt tates has a rapid heart rate. eval for pE EXAM: CT ANGIOGRAPHY CHEST WITH CONTRAST TECHNIQUE: Multidetector CT imaging of the chest was performed using the standard protocol during bolus administration of intravenous contrast. Multiplanar CT image reconstructions and MIPs were obtained to evaluate the vascular anatomy. CONTRAST:  74mL OMNIPAQUE IOHEXOL 350 MG/ML SOLN COMPARISON:  CT abdomen pelvis 10/10/2019  FINDINGS: Cardiovascular: No filling defects within the pulmonary arteries to suggest acute pulmonary embolism. No acute findings of the aorta or great vessels. No pericardial fluid. Mediastinum/Nodes: No axillary supraclavicular adenopathy. No mediastinal hilar adenopathy. No pericardial effusion. Port in the anterior chest wall with tip in distal SVC. Lungs/Pleura: No suspicious pulmonary nodules. No infiltrate Upper Abdomen: Innumerable hepatic metastasis again noted. Musculoskeletal: No aggressive osseous lesion Review of the MIP images confirms the above findings. IMPRESSION: 1. No evidence acute pulmonary embolism. 2. No acute pulmonary parenchymal findings. 3. Innumerable hepatic metastasis. Electronically Signed   By: Suzy Bouchard M.D.   On: 10/11/2019 09:50   Ct Abdomen Pelvis W Contrast  Result Date: 10/10/2019 CLINICAL DATA:  58 year old male with history of pancreatic cancer diagnosed in February 2020. Elevated bilirubin. Evaluate for potential biliary obstruction. EXAM: CT ABDOMEN AND PELVIS WITH CONTRAST TECHNIQUE: Multidetector CT imaging of the abdomen and pelvis was performed using the standard protocol following bolus administration of intravenous contrast. CONTRAST:  165mL OMNIPAQUE IOHEXOL 300 MG/ML  SOLN COMPARISON:  CT the abdomen and pelvis 08/24/2019. Abdominal MRI 09/05/2019. FINDINGS: Lower chest: Unremarkable. Hepatobiliary: Marked increase in number and size of what are now innumerable low-intermediate attenuation lesions scattered throughout the hepatic parenchyma, largest of which measure up to 3.6 x 2.4 cm in segment 4B of the liver (axial image 37 of series 3). Some of these lesions appear elongated within the hepatic parenchyma best appreciated on sagittal image 53 of series 7, where that length of the lesion previously described measures up to 11.6 cm. These lesions are exerting local mass effect upon adjacent structures causing compression of central bile ducts resulting in  mild to moderate intrahepatic biliary ductal dilatation throughout the right lobe of the liver. Status post cholecystectomy is part of Whipple procedure. In the gallbladder fossa there is a large amount of intermediate attenuation fluid and/or soft tissue which has a thick rim of peripheral enhancement estimated to measure approximately 6.1 x 6.6 x 3.5 cm (axial image 43 of series 3 and coronal image 33 of series 6). This soft tissue appears intimately associated both with the undersurface of the liver, as well as several adjacent small bowel loops, in close proximity to the presumed location of the hepaticojejunostomy. Previously noted fluid collection beneath the right lobe of the liver (which previously had a percutaneous drainage catheter on prior CT 08/24/2019) is again noted on today's examination, currently measuring 5.7 x 5.3 x 6.9 cm (axial image 51 of series 3 and  coronal image 34 of series 6) with thick peripheral rim of soft tissue and enhancement which appears to partially extend into the right oblique musculature best appreciated on axial image 47 of series 3 and coronal image 33 of series 6). Pancreas: Postoperative changes of Whipple procedure. Spleen: Unremarkable. Adrenals/Urinary Tract: 1.5 cm low-attenuation lesion in the upper pole of the right kidney, compatible with a simple cyst. Left kidney and bilateral adrenal glands are normal in appearance. No hydroureteronephrosis. Urinary bladder is normal in appearance. Stomach/Bowel: Postoperative changes of Whipple procedure. Loops of small bowel and colon traversing beneath the right lobe of the liver appear thickened as they come in close association with the previously noted fluid and/or soft tissue collection in this region. No pathologic dilatation of small bowel or colon. Normal appendix. Vascular/Lymphatic: Aortic atherosclerosis, without evidence of aneurysm or dissection in the abdominal or pelvic vasculature. Small of filling defect in the  proximal portal vein (axial image 36 of series 3), concerning for nonobstructive thrombus. Enlarged lymph node immediately anterior to the superior mesenteric vein (axial image 43 of series 3) measuring 1.5 cm in short axis. Reproductive: Prostate gland and seminal vesicles are unremarkable in appearance. Other: Trace volume of perihepatic ascites.  No pneumoperitoneum. Musculoskeletal: There are no aggressive appearing lytic or blastic lesions noted in the visualized portions of the skeleton. IMPRESSION: 1. Marked increased number and size of innumerable hepatic lesions, as well as persistence of right infra hepatic postoperative fluid collection and development of an additional fluid and/or soft tissue collection beneath the gallbladder fossa. Overall, the findings are favored to reflect progressive metastatic disease to the liver, however, there is overlap with potential infectious etiologies (multiple intrahepatic abscesses and subhepatic abscesses). Guided sampling of one or more of these lesions is suggested to establish a definitive diagnosis. 2. 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 3. Interval development of mildly enlarged mesenteric lymph node measuring 1.5 cm in short axis immediately anterior to the portal vein, suspicious for a metastatic lymph node, although reactive lymphadenopathy in the setting of extensive infection is not excluded. 4. Additional incidental findings, as above. Electronically Signed   By: Vinnie Langton M.D.   On: 10/10/2019 17:34   Dg Chest Port 1 View  Result Date: 10/11/2019 CLINICAL DATA:  Chills. Additional history provided: Syncopal episode, history of liver CA. EXAM: PORTABLE CHEST 1 VIEW COMPARISON:  CT angiogram chest 08/07/2019, chest radiograph 08/07/2011 FINDINGS: Left chest infusion port catheter with tip projecting in the region of the superior cavoatrial junction. Heart size within normal limits. No airspace consolidation within the lungs.  No evidence of pleural effusion or pneumothorax. No acute bony abnormality. IMPRESSION: No evidence of acute cardiopulmonary abnormality. Electronically Signed   By: Kellie Simmering DO   On: 10/11/2019 07:57    ____________________________________________   PROCEDURES  Procedure(s) performed:   Procedures   CRITICAL CARE Performed by: Margette Fast Total critical care time: 35 minutes Critical care time was exclusive of separately billable procedures and treating other patients. Critical care was necessary to treat or prevent imminent or life-threatening deterioration. Critical care was time spent personally by me on the following activities: development of treatment plan with patient and/or surrogate as well as nursing, discussions with consultants, evaluation of patient's response to treatment, examination of patient, obtaining history from patient or surrogate, ordering and performing treatments and interventions, ordering and review of laboratory studies, ordering and review of radiographic studies, pulse oximetry and re-evaluation of patient's condition.  Nanda Quinton, MD  Emergency Medicine  ____________________________________________   INITIAL IMPRESSION / ASSESSMENT AND PLAN / ED COURSE  Pertinent labs & imaging results that were available during my care of the patient were reviewed by me and considered in my medical decision making (see chart for details).   Patient presents to the emergency department for evaluation of what sounds like chills with either syncope versus near syncope.  Wife apparently in route to the hospital provide additional history.  Afebrile here but very tachycardic.  Tachycardia was noted on his oncology visit from 2 days ago.  I reviewed the CT scan performed yesterday which shows likely worsening metastatic disease but cannot rule out intra-abdominal infection/multiple abscesses. Covering with abx.   08:23 AM  Lactate elevated at 5.2.  Ibuprofen given for  fever.  I have paged oncology age to discuss CT scan from yesterday.  Antibiotics already started earlier.  Given the lactate greater than 4 I have added additional 2 L LR to complete a 30 mL/kg bolus.   Spoke with Dr. Learta Codding who has already spoken with IR who plans on attempting to access intra-abdominal abscess and possible cholangiogram. Agrees with plan for [facility] admit after CTA chest. Updated patient and wife at bedside.   Discussed patient's case with [facility] to request admission. Patient and family (if present) updated with plan. Care transferred to [facility] service.  I reviewed all nursing notes, vitals, pertinent old records, EKGs, labs, imaging (as available).  ____________________________________________  FINAL CLINICAL IMPRESSION(S) / ED DIAGNOSES  Final diagnoses:  Sepsis, due to unspecified organism, unspecified whether acute organ dysfunction present [facility])     MEDICATIONS GIVEN DURING THIS VISIT:  Medications  sodium chloride 0.9 % bolus 1,000 mL (0 mLs Intravenous Stopped 10/11/19 0912)  ceFEPIme (MAXIPIME) 2 g in sodium chloride 0.9 % 100 mL IVPB (0 g Intravenous Stopped 10/11/19 0758)  metroNIDAZOLE (FLAGYL) IVPB 500 mg (0 mg Intravenous Stopped 10/11/19 0912)  vancomycin (VANCOCIN) 1,500 mg in sodium chloride 0.9 % 500 mL IVPB (0 mg Intravenous Stopped 10/11/19 1032)  ibuprofen (ADVIL) tablet 600 mg (600 mg Oral Given 10/11/19 0745)  lactated ringers bolus 1,000 mL (0 mLs Intravenous Stopped 10/11/19 1046)    And  lactated ringers bolus 1,000 mL (0 mLs Intravenous Stopped 10/11/19 1046)  iohexol (OMNIPAQUE) 350 MG/ML injection 100 mL (75 mLs Intravenous Contrast Given 10/11/19 0912)  sodium chloride (PF) 0.9 % injection (  Given by Other 10/11/19 0929)     Note:  This document was prepared using Dragon voice recognition software and may include unintentional dictation errors.  Nanda Quinton, MD, [facility] Emergency Medicine    Yailen Zemaitis, [facility], MD 10/11/19 [phone]

## 2019-10-11 NOTE — ED Triage Notes (Signed)
Per EMS: Patient is coming from home with c/o syncopal episodes. Patient did not have a fall. Patient has a hx of stage four liver cancer. Has a previous abdominal surgery from September. BP went from 60/20 supine to 88/42.   EMS VITALS: BP 88/42 SPO2 96% CBG 200

## 2019-10-11 NOTE — ED Notes (Signed)
Pt ambulated back from RR with minor assistance from [employer].  Pt was assisted back into the bed and monitoring continued.

## 2019-10-11 NOTE — ED Notes (Signed)
Pt ambulated to RR with minor assistance. Advised pt to pull call cord if he needed assistance.

## 2019-10-11 NOTE — ED Notes (Signed)
Pt is currently resting, unable to collect urine at this time, will monitor.

## 2019-10-12 ENCOUNTER — Encounter (HOSPITAL_COMMUNITY): Payer: Self-pay | Admitting: Internal Medicine

## 2019-10-12 ENCOUNTER — Inpatient Hospital Stay (HOSPITAL_COMMUNITY): Payer: Managed Care, Other (non HMO)

## 2019-10-12 DIAGNOSIS — C259 Malignant neoplasm of pancreas, unspecified: Secondary | ICD-10-CM

## 2019-10-12 DIAGNOSIS — F17210 Nicotine dependence, cigarettes, uncomplicated: Secondary | ICD-10-CM

## 2019-10-12 DIAGNOSIS — Z95828 Presence of other vascular implants and grafts: Secondary | ICD-10-CM

## 2019-10-12 DIAGNOSIS — A419 Sepsis, unspecified organism: Secondary | ICD-10-CM

## 2019-10-12 DIAGNOSIS — Z978 Presence of other specified devices: Secondary | ICD-10-CM

## 2019-10-12 DIAGNOSIS — C799 Secondary malignant neoplasm of unspecified site: Secondary | ICD-10-CM

## 2019-10-12 DIAGNOSIS — B955 Unspecified streptococcus as the cause of diseases classified elsewhere: Secondary | ICD-10-CM

## 2019-10-12 DIAGNOSIS — K750 Abscess of liver: Secondary | ICD-10-CM

## 2019-10-12 LAB — BLOOD CULTURE ID PANEL (REFLEXED)

## 2019-10-12 LAB — COMPREHENSIVE METABOLIC PANEL
ALT: 184 U/L — ABNORMAL HIGH (ref 0–44)
AST: 551 U/L — ABNORMAL HIGH (ref 15–41)
Albumin: 1.7 g/dL — ABNORMAL LOW (ref 3.5–5.0)
Alkaline Phosphatase: 747 U/L — ABNORMAL HIGH (ref 38–126)
Anion gap: 11 (ref 5–15)
BUN: 33 mg/dL — ABNORMAL HIGH (ref 6–20)
CO2: 17 mmol/L — ABNORMAL LOW (ref 22–32)
Calcium: 7.5 mg/dL — ABNORMAL LOW (ref 8.9–10.3)
Chloride: 105 mmol/L (ref 98–111)
Creatinine, Ser: 1.15 mg/dL (ref 0.61–1.24)
GFR calc Af Amer: >60 mL/min (ref >60)
GFR calc non Af Amer: >60 mL/min (ref >60)
Glucose, Bld: 106 mg/dL — ABNORMAL HIGH (ref 70–99)
Potassium: 4.1 mmol/L (ref 3.5–5.1)
Sodium: 133 mmol/L — ABNORMAL LOW (ref 135–145)
Total Bilirubin: 6.2 mg/dL — ABNORMAL HIGH (ref 0.3–1.2)
Total Protein: 6 g/dL — ABNORMAL LOW (ref 6.5–8.1)

## 2019-10-12 LAB — CBC
HCT: 22.3 % — ABNORMAL LOW (ref 39.0–52.0)
Hemoglobin: 7.2 g/dL — ABNORMAL LOW (ref 13.0–17.0)
MCH: 28.8 pg (ref 26.0–34.0)
MCHC: 32.3 g/dL (ref 30.0–36.0)
MCV: 89.2 fL (ref 80.0–100.0)
Platelets: 181 10*3/uL (ref 150–400)
RBC: 2.5 MIL/uL — ABNORMAL LOW (ref 4.22–5.81)
RDW: 18.6 % — ABNORMAL HIGH (ref 11.5–15.5)
WBC: 10.1 10*3/uL (ref 4.0–10.5)
nRBC: 0 % (ref 0.0–0.2)

## 2019-10-12 LAB — GLUCOSE, CAPILLARY
Glucose-Capillary: 236 mg/dL — ABNORMAL HIGH (ref 70–99)
Glucose-Capillary: 278 mg/dL — ABNORMAL HIGH (ref 70–99)

## 2019-10-12 LAB — CBG MONITORING, ED
Glucose-Capillary: 110 mg/dL — ABNORMAL HIGH (ref 70–99)
Glucose-Capillary: 103 mg/dL — ABNORMAL HIGH (ref 70–99)
Glucose-Capillary: 96 mg/dL (ref 70–99)
Glucose-Capillary: 127 mg/dL — ABNORMAL HIGH (ref 70–99)

## 2019-10-12 LAB — URINE CULTURE: Culture: NO GROWTH

## 2019-10-12 LAB — PROTIME-INR
INR: 2.1 — ABNORMAL HIGH (ref 0.8–1.2)
Prothrombin Time: 23.2 seconds — ABNORMAL HIGH (ref 11.4–15.2)

## 2019-10-12 MED ORDER — MIDAZOLAM HCL 2 MG/2ML IJ SOLN
INTRAMUSCULAR | Status: AC
  Filled 2019-10-12: qty 4

## 2019-10-12 MED ORDER — FENTANYL CITRATE (PF) 100 MCG/2ML IJ SOLN
INTRAMUSCULAR | Status: AC
  Filled 2019-10-12: qty 2

## 2019-10-12 MED ORDER — DIPHENHYDRAMINE HCL 50 MG/ML IJ SOLN
25.0000 mg | Freq: Once | INTRAMUSCULAR | Status: AC
  Administered 2019-10-12: 25 mg via INTRAVENOUS
  Filled 2019-10-12: qty 1

## 2019-10-12 MED ORDER — APIXABAN 5 MG PO TABS: 5.0000 mg | ORAL_TABLET | Freq: Two times a day (BID) | ORAL | Status: DC

## 2019-10-12 MED ORDER — LIDOCAINE HCL (PF) 1 % IJ SOLN
INTRAMUSCULAR | Status: AC | PRN
  Administered 2019-10-12: 10 mL

## 2019-10-12 MED ORDER — PIPERACILLIN-TAZOBACTAM 3.375 G IVPB: 3.3750 g | Freq: Three times a day (TID) | INTRAVENOUS | Status: DC

## 2019-10-12 MED ORDER — SODIUM CHLORIDE 0.9 % IV SOLN
2.0000 g | INTRAVENOUS | Status: DC
  Administered 2019-10-12 – 2019-10-15 (×4): 2 g via INTRAVENOUS
  Filled 2019-10-12 (×2): qty 2
  Filled 2019-10-12: qty 20
  Filled 2019-10-12 (×2): qty 2

## 2019-10-12 MED ORDER — CHLORHEXIDINE GLUCONATE CLOTH 2 % EX PADS
6.0000 | MEDICATED_PAD | Freq: Every day | CUTANEOUS | Status: DC
  Administered 2019-10-12 – 2019-10-16 (×5): 6 via TOPICAL

## 2019-10-12 MED ORDER — MIDAZOLAM HCL 2 MG/2ML IJ SOLN
INTRAMUSCULAR | Status: AC | PRN
  Administered 2019-10-12 (×2): 1 mg via INTRAVENOUS

## 2019-10-12 MED ORDER — FENTANYL CITRATE (PF) 100 MCG/2ML IJ SOLN
INTRAMUSCULAR | Status: AC | PRN
  Administered 2019-10-12 (×2): 50 ug via INTRAVENOUS

## 2019-10-12 NOTE — ED Notes (Signed)
Pt was moved into a hospital bed for comfort.  Call light is within reach.

## 2019-10-12 NOTE — Progress Notes (Signed)
PHARMACY - PHYSICIAN COMMUNICATION CRITICAL VALUE ALERT - BLOOD CULTURE IDENTIFICATION (BCID)  Brian Torres is an 58 y.o. male who presented to [facility] on 10/11/2019 with a chief complaint of fever, chills, syncopal episode  Assessment: Day 2 Vancomycin/Cefepime/Flagyl for r/o sepsis/ intra-abd infection. 12/10 RUQ drain placed  Name of physician (or Provider) Contacted: Dr Elbert Ewings   Current antibiotics: Vanc/Cefepime/Flagyl  Changes to prescribed antibiotics recommended: No change  Results for orders placed or performed during the hospital encounter of 10/11/19  Blood Culture ID Panel (Reflexed) (Collected: 10/11/2019  7:15 AM)  Result Value Ref Range   Enterococcus species NOT DETECTED NOT DETECTED   Listeria monocytogenes NOT DETECTED NOT DETECTED   Staphylococcus species NOT DETECTED NOT DETECTED   Staphylococcus aureus (BCID) NOT DETECTED NOT DETECTED   Streptococcus species DETECTED (A) NOT DETECTED   Streptococcus agalactiae NOT DETECTED NOT DETECTED   Streptococcus pneumoniae NOT DETECTED NOT DETECTED   Streptococcus pyogenes NOT DETECTED NOT DETECTED   Acinetobacter baumannii NOT DETECTED NOT DETECTED   Enterobacteriaceae species NOT DETECTED NOT DETECTED   Enterobacter cloacae complex NOT DETECTED NOT DETECTED   Escherichia coli NOT DETECTED NOT DETECTED   Klebsiella oxytoca NOT DETECTED NOT DETECTED   Klebsiella pneumoniae NOT DETECTED NOT DETECTED   Proteus species NOT DETECTED NOT DETECTED   Serratia marcescens NOT DETECTED NOT DETECTED   Haemophilus influenzae NOT DETECTED NOT DETECTED   Neisseria meningitidis NOT DETECTED NOT DETECTED   Pseudomonas aeruginosa NOT DETECTED NOT DETECTED   Candida albicans NOT DETECTED NOT DETECTED   Candida glabrata NOT DETECTED NOT DETECTED   Candida krusei NOT DETECTED NOT DETECTED   Candida parapsilosis NOT DETECTED NOT DETECTED   Candida tropicalis NOT DETECTED NOT DETECTED    Minda Ditto PharmD 10/12/2019  9:12  AM

## 2019-10-12 NOTE — Procedures (Signed)
Interventional Radiology Procedure Note  Procedure: CT guided RUQ drain placement.  53F drain.  Complications: None Recommendations:  - TO bulb suction. - cx pending - Do not submerge  - Routine  care   Signed,  Dulcy Fanny. Earleen Newport, DO

## 2019-10-12 NOTE — Progress Notes (Signed)
This IV RN came in to the room to changed [facility] dressing, family member at bedside requested to do it later when patient is awake since he just went to sleep. RN made aware.

## 2019-10-12 NOTE — ED Notes (Signed)
Pt returned from IR where drain was placed.

## 2019-10-12 NOTE — Consult Note (Signed)
[place] for Infectious Disease       Reason for Consult: liver abscess, bacteremia    Referring Physician: Dr. Tawanna Solo  Active Problems:   Sepsis ([place])   AKI (acute kidney injury) ([place])   . [START ON 10/13/2019] apixaban  5 mg Oral BID  . B-complex with vitamin C  1 tablet Oral Daily  . Chlorhexidine Gluconate Cloth  6 each Topical Daily  . ferrous sulfate  325 mg Oral BID WC  . insulin aspart  0-9 Units Subcutaneous Q4H  . lidocaine  1 patch Transdermal QHS  . lipase/protease/amylase  36,000 Units Oral TID AC    Recommendations: Ceftriaxone    Assessment: He has a liver abscess and bacteremia with Streptococcus on the BCID, awaiting final ID.  He will need a prolonged course of antibiotics but oral therapy with drainage will likely be adequate, depending on the organism sensitivities.   With the gram positive bacteremia though, there is concern of seeding the port-a-cath.  Ideally, he would have his port removed.  However, I think it is reasonable if his blood cultures repeated tomorrow remain negative and he improves, to keep it in depending on overall goals of his care and prognosis.    Antibiotics: zosyn  HPI: Brian Torres is a 58 y.o. male with metastatic pancreatic cancer who came in with fever and chills.  Had been having chills.  Recently noted to have an intraadominal abscess and recent culture in the blood with Prevotella on 10/5 and aspiration of the abscess 10/7 with multiple organisms.  For the last two weeks he has been feeling worse, less energy.  He had been having chills and developed a fever and CT with multiple areas in his liver and concern for metastatic disease vs abscesses, or both.  Aspiration of part with pus and growth with GPC and blood cultures with Strep.  Wife at bedside.    Review of Systems:  Constitutional: negative for fevers, chills, fatigue and malaise Gastrointestinal: positive for abdominal pain, negative for  diarrhea Integument/breast: negative for rash All other systems reviewed and are negative    Past Medical History:  Diagnosis Date  . Cancer ([place]) 12/2018   pancreatic cancer  . Depression   . Diabetes mellitus without complication ([place])    type 2  . Dyslipidemia   . Erectile dysfunction   . Family history of breast cancer   . Family history of melanoma   . Hypertension    white coat syndrome  . Nicotine addiction     Social History   Tobacco Use  . Smoking status: Current Every Day Smoker    Packs/day: 1.00    Types: Cigarettes  . Smokeless tobacco: Never Used  Substance Use Topics  . Alcohol use: No  . Drug use: No    Family History  Problem Relation Age of Onset  . Diabetes Mother   . Diabetes Father   . CAD Father   . Melanoma Father   . Breast cancer Paternal Aunt 30  . COPD Maternal Aunt   . Melanoma Paternal Uncle   . Stroke Maternal Grandmother   . Tuberculosis Paternal Grandmother   . Cancer Neg Hx     No Known Allergies  Physical Exam: Constitutional: in no apparent distress  Vitals:   10/12/19 1452 10/12/19 1506  BP:  119/75  Pulse:  87  Resp:  16  Temp: 98 F (36.7 C) 98 F (36.7 C)  SpO2:  100%   EYES:  anicteric ENMT: no thrush Cardiovascular: Cor RRR Respiratory: CTA B; GI: soft, + drain Musculoskeletal: no pedal edema noted Skin: negatives: no rash Neuro: non focal  Lab Results  Component Value Date   WBC 10.1 10/12/2019   HGB 7.2 (L) 10/12/2019   HCT 22.3 (L) 10/12/2019   MCV 89.2 10/12/2019   PLT 181 10/12/2019    Lab Results  Component Value Date   CREATININE 1.15 10/12/2019   BUN 33 (H) 10/12/2019   NA 133 (L) 10/12/2019   K 4.1 10/12/2019   CL 105 10/12/2019   CO2 17 (L) 10/12/2019    Lab Results  Component Value Date   ALT 184 (H) 10/12/2019   AST 551 (H) 10/12/2019   ALKPHOS 747 (H) 10/12/2019     Microbiology: Recent Results (from the past 240 hour(s))  Blood Culture (routine x 2)     Status: None  (Preliminary result)   Collection Time: 10/11/19  7:10 AM   Specimen: BLOOD  Result Value Ref Range Status   Specimen Description   Final    BLOOD PORTA CATH Performed at [facility], [place] [address], [place], [place] [address]    Special Requests   Final    BOTTLES DRAWN AEROBIC AND ANAEROBIC Blood Culture adequate volume Performed at [place] [address], [place], [place] [address]    Culture  Setup Time   Final    GRAM POSITIVE COCCI IN CHAINS CRITICAL RESULT CALLED TO, READ BACK BY AND VERIFIED WITH: B GREEN PHARMD 10/12/19 0629 JDW IN BOTH AEROBIC AND ANAEROBIC BOTTLES    Culture   Final    NO GROWTH 1 DAY Performed at [facility], [address] [place], [place] [address]    Report Status PENDING  Incomplete  Respiratory Panel by RT PCR (Flu A&B, Covid) - Nasopharyngeal Swab     Status: None   Collection Time: 10/11/19  7:11 AM   Specimen: Nasopharyngeal Swab  Result Value Ref Range Status   SARS Coronavirus 2 by RT PCR NEGATIVE NEGATIVE Final    Comment: (NOTE) SARS-CoV-2 target nucleic acids are NOT DETECTED. The SARS-CoV-2 RNA is generally detectable in upper respiratoy specimens during the acute phase of infection. The lowest concentration of SARS-CoV-2 viral copies this assay can detect is 131 copies/mL. A negative result does not preclude SARS-Cov-2 infection and should not be used as the sole basis for treatment or other patient management decisions. A negative result may occur with  improper specimen collection/handling, submission of specimen other than nasopharyngeal swab, presence of viral mutation(s) within the areas targeted by this assay, and inadequate number of viral copies (<131 copies/mL). A negative result must be combined with clinical observations, patient history, and epidemiological information. The expected result is Negative. Fact Sheet for Patients:    PinkCheek.be Fact Sheet for Healthcare Providers:  GravelBags.it This test is not yet ap proved or cleared by the [place] FDA and  has been authorized for detection and/or diagnosis of SARS-CoV-2 by FDA under an Emergency Use Authorization (EUA). This EUA will remain  in effect (meaning this test can be used) for the duration of the COVID-19 declaration under Section 564(b)(1) of the Act, 21 U.S.C. section 360bbb-3(b)(1), unless the authorization is terminated or revoked sooner.    Influenza A by PCR NEGATIVE NEGATIVE Final   Influenza B by PCR NEGATIVE NEGATIVE Final    Comment: (NOTE) The Xpert Xpress SARS-CoV-2/FLU/RSV assay is intended as an aid in  the  diagnosis of influenza from Nasopharyngeal swab specimens and  should not be used as a sole basis for treatment. Nasal washings and  aspirates are unacceptable for Xpert Xpress SARS-CoV-2/FLU/RSV  testing. Fact Sheet for Patients: PinkCheek.be Fact Sheet for Healthcare Providers: GravelBags.it This test is not yet approved or cleared by the [place] FDA and  has been authorized for detection and/or diagnosis of SARS-CoV-2 by  FDA under an Emergency Use Authorization (EUA). This EUA will remain  in effect (meaning this test can be used) for the duration of the  Covid-19 declaration under Section 564(b)(1) of the Act, 21  U.S.C. section 360bbb-3(b)(1), unless the authorization is  terminated or revoked. Performed at [facility], [place] [address], [place], [place] [address]   Blood Culture (routine x 2)     Status: None (Preliminary result)   Collection Time: 10/11/19  7:15 AM   Specimen: BLOOD  Result Value Ref Range Status   Specimen Description   Final    BLOOD LEFT ANTECUBITAL Performed at [place] [address] [place], [place] [address]    Special  Requests   Final    BOTTLES DRAWN AEROBIC AND ANAEROBIC Blood Culture adequate volume Performed at [place] [address], [place], [place] [address]    Culture  Setup Time   Final    GRAM POSITIVE COCCI IN CHAINS IN BOTH AEROBIC AND ANAEROBIC BOTTLES CRITICAL RESULT CALLED TO, READ BACK BY AND VERIFIED WITH: PHARMD M RENZ [id number] AT 908 AM  BY CM Performed at [facility], [place] [address] [place], [place] [address]    Culture GRAM POSITIVE COCCI IN CHAINS  Final   Report Status PENDING  Incomplete  Blood Culture ID Panel (Reflexed)     Status: Abnormal   Collection Time: 10/11/19  7:15 AM  Result Value Ref Range Status   Enterococcus species NOT DETECTED NOT DETECTED Final   Listeria monocytogenes NOT DETECTED NOT DETECTED Final   Staphylococcus species NOT DETECTED NOT DETECTED Final   Staphylococcus aureus (BCID) NOT DETECTED NOT DETECTED Final   Streptococcus species DETECTED (A) NOT DETECTED Final    Comment: Not Enterococcus species, Streptococcus agalactiae, Streptococcus pyogenes, or Streptococcus pneumoniae. CRITICAL RESULT CALLED TO, READ BACK BY AND VERIFIED WITH: PHARMD M RENZ 121020 AT 62 BY CM    Streptococcus agalactiae NOT DETECTED NOT DETECTED Final   Streptococcus pneumoniae NOT DETECTED NOT DETECTED Final   Streptococcus pyogenes NOT DETECTED NOT DETECTED Final   Acinetobacter baumannii NOT DETECTED NOT DETECTED Final   Enterobacteriaceae species NOT DETECTED NOT DETECTED Final   Enterobacter cloacae complex NOT DETECTED NOT DETECTED Final   Escherichia coli NOT DETECTED NOT DETECTED Final   Klebsiella oxytoca NOT DETECTED NOT DETECTED Final   Klebsiella pneumoniae NOT DETECTED NOT DETECTED Final   Proteus species NOT DETECTED NOT DETECTED Final   Serratia marcescens NOT DETECTED NOT DETECTED Final   Haemophilus influenzae NOT DETECTED NOT DETECTED Final   Neisseria meningitidis NOT DETECTED NOT DETECTED Final   Pseudomonas  aeruginosa NOT DETECTED NOT DETECTED Final   Candida albicans NOT DETECTED NOT DETECTED Final   Candida glabrata NOT DETECTED NOT DETECTED Final   Candida krusei NOT DETECTED NOT DETECTED Final   Candida parapsilosis NOT DETECTED NOT DETECTED Final   Candida tropicalis NOT DETECTED NOT DETECTED Final    Comment: Performed at [facility], [place] [address], [place], [place] [address]  Aerobic/Anaerobic Culture (surgical/deep wound)  Status: None (Preliminary result)   Collection Time: 10/12/19  8:58 AM   Specimen: Abscess  Result Value Ref Range Status   Specimen Description   Final    ABSCESS ABDOMEN Performed at [place] [address] [place], [place] [address]    Special Requests   Final    NONE Performed at [facility], [place] [address] [place], [place] [address]    Gram Stain   Final    ABUNDANT WBC PRESENT, PREDOMINANTLY PMN MODERATE GRAM NEGATIVE COCCOBACILLI Performed at [facility], [place] [address] [place],  [address]    Culture PENDING  Incomplete   Report Status PENDING  Incomplete    Thayer Headings, [place] for Infectious Disease [facility] [email or web address] 10/12/2019, 3:54 PM

## 2019-10-12 NOTE — Progress Notes (Signed)
PHARMACY - PHYSICIAN COMMUNICATION CRITICAL VALUE ALERT - BLOOD CULTURE IDENTIFICATION (BCID)  [name] is an 58 y.o. male who presented to [facility] on 10/11/2019 with a chief complaint of chills  Assessment:  Metastatic pancreatic ca (include suspected source if known) 1 of 4 bottle GPC in chains Name of physician (or Provider) Contacted: Lorra Hals, NP  Current antibiotics: vanc, flagyl and cefepime  Changes to prescribed antibiotics recommended:  No change, BCID pending  Results for orders placed or performed during the hospital encounter of 08/07/19  Blood Culture ID Panel (Reflexed) (Collected: 08/07/2019  3:31 PM)  Result Value Ref Range   Enterococcus species NOT DETECTED NOT DETECTED   Listeria monocytogenes NOT DETECTED NOT DETECTED   Staphylococcus species NOT DETECTED NOT DETECTED   Staphylococcus aureus (BCID) NOT DETECTED NOT DETECTED   Streptococcus species NOT DETECTED NOT DETECTED   Streptococcus agalactiae NOT DETECTED NOT DETECTED   Streptococcus pneumoniae NOT DETECTED NOT DETECTED   Streptococcus pyogenes NOT DETECTED NOT DETECTED   Acinetobacter baumannii NOT DETECTED NOT DETECTED   Enterobacteriaceae species NOT DETECTED NOT DETECTED   Enterobacter cloacae complex NOT DETECTED NOT DETECTED   Escherichia coli NOT DETECTED NOT DETECTED   Klebsiella oxytoca NOT DETECTED NOT DETECTED   Klebsiella pneumoniae NOT DETECTED NOT DETECTED   Proteus species NOT DETECTED NOT DETECTED   Serratia marcescens NOT DETECTED NOT DETECTED   Haemophilus influenzae NOT DETECTED NOT DETECTED   Neisseria meningitidis NOT DETECTED NOT DETECTED   Pseudomonas aeruginosa NOT DETECTED NOT DETECTED   Candida albicans NOT DETECTED NOT DETECTED   Candida glabrata NOT DETECTED NOT DETECTED   Candida krusei NOT DETECTED NOT DETECTED   Candida parapsilosis NOT DETECTED NOT DETECTED   Candida tropicalis NOT DETECTED NOT DETECTED    Dorrene German 10/12/2019  6:35 AM

## 2019-10-12 NOTE — ED Notes (Signed)
ED TO INPATIENT HANDOFF REPORT  ED Nurse Name and Phone #:   S Name/Age/Gender Brian Torres 58 y.o. male Room/Bed: WA10/WA10  Code Status   Code Status: Full Code  Home/SNF/Other Home Patient oriented to: self, place, time and situation Is this baseline? Yes   Triage Complete: Triage complete  Chief Complaint Sepsis [facility]) [A41.9]  Triage Note Per EMS: Patient is coming from home with c/o syncopal episodes. Patient did not have a fall. Patient has a hx of stage four liver cancer. Has a previous abdominal surgery from September. BP went from 60/20 supine to 88/42.   EMS VITALS: BP 88/42 SPO2 96% CBG 200    Allergies No Known Allergies  Level of Care/Admitting Diagnosis ED Disposition    ED Disposition Condition Comment   Admit  Hospital Area: [facility] [[id number]]  Level of Care: Telemetry [5]  Admit to tele based on following criteria: Complex arrhythmia (Bradycardia/Tachycardia)  Covid Evaluation: Confirmed COVID Negative  [facility]: [id number]  Admitting Physician: Shelly Coss [id number]  Attending Physician: Shelly Coss [id number]  Estimated length of stay: past midnight tomorrow  Certification:: I certify this patient will need inpatient services for at least 2 midnights       B Medical/Surgery History Past Medical History:  Diagnosis Date  . Cancer ([place]) 12/2018   pancreatic cancer  . Depression   . Diabetes mellitus without complication ([place])    type 2  . Dyslipidemia   . Erectile dysfunction   . Family history of breast cancer   . Family history of melanoma   . Hypertension    white coat syndrome  . Nicotine addiction    Past Surgical History:  Procedure Laterality Date  . BILIARY STENT PLACEMENT N/A 12/21/2018   Procedure: BILIARY STENT PLACEMENT;  Surgeon: Clarene Essex, MD;  [facility];  Service: Endoscopy;  Laterality: N/A;  . BILIARY STENT PLACEMENT N/A 01/24/2019   Procedure: BILIARY  STENT PLACEMENT;  Surgeon: Arta Silence, MD;  [facility];  Service: Endoscopy;  Laterality: N/A;  . BIOPSY  12/21/2018   Procedure: BIOPSY;  Surgeon: Clarene Essex, MD;  [facility];  Service: Endoscopy;;  . ENDOSCOPIC RETROGRADE CHOLANGIOPANCREATOGRAPHY (ERCP) WITH PROPOFOL N/A 12/21/2018   Procedure: ENDOSCOPIC RETROGRADE CHOLANGIOPANCREATOGRAPHY (ERCP) WITH PROPOFOL;  Surgeon: Clarene Essex, MD;  [facility];  Service: Endoscopy;  Laterality: N/A;  . ENDOSCOPIC RETROGRADE CHOLANGIOPANCREATOGRAPHY (ERCP) WITH PROPOFOL N/A 01/24/2019   Procedure: ENDOSCOPIC RETROGRADE CHOLANGIOPANCREATOGRAPHY (ERCP) WITH PROPOFOL;  Surgeon: Arta Silence, MD;  [facility];  Service: Endoscopy;  Laterality: N/A;  Balloon Sweep of Duct  . ESOPHAGOGASTRODUODENOSCOPY N/A 01/03/2019   Procedure: ESOPHAGOGASTRODUODENOSCOPY (EGD);  Surgeon: Arta Silence, MD;  [name];  Service: Endoscopy;  Laterality: N/A;  . ESOPHAGOGASTRODUODENOSCOPY (EGD) WITH PROPOFOL N/A 01/24/2019   Procedure: ESOPHAGOGASTRODUODENOSCOPY (EGD) WITH PROPOFOL;  Surgeon: Arta Silence, MD;  [facility];  Service: Endoscopy;  Laterality: N/A;  stent removal  . EUS N/A 01/03/2019   Procedure: FULL UPPER ENDOSCOPIC ULTRASOUND (EUS) Linear not radial;  Surgeon: Arta Silence, MD;  [facility];  Service: Endoscopy;  Laterality: N/A;  . EUS N/A 01/24/2019   Procedure: FULL UPPER ENDOSCOPIC ULTRASOUND (EUS) RADIAL;  Surgeon: Arta Silence, MD;  [facility];  Service: Endoscopy;  Laterality: N/A;  . FINE NEEDLE ASPIRATION N/A 01/03/2019   Procedure: FINE NEEDLE ASPIRATION (FNA) LINEAR;  Surgeon: Arta Silence, MD;  [facility];  Service: Endoscopy;  Laterality: N/A;  . FINE NEEDLE ASPIRATION N/A  01/24/2019   Procedure: FINE NEEDLE ASPIRATION (FNA) LINEAR;  Surgeon: Arta Silence, MD;  [facility];  Service: Endoscopy;  Laterality: N/A;  . IR  RADIOLOGIST EVAL & MGMT  08/24/2019  . LAPAROSCOPY N/A 07/20/2019   Procedure: LAPAROSCOPY DIAGNOSTIC;  Surgeon: [name], MD;  Location: [facility];  Service: General;  Laterality: N/A;  . LOWER EXTREMITY ANGIOGRAPHY N/A 10/18/2017   Procedure: LOWER EXTREMITY ANGIOGRAPHY - IVUS - LYSIS CATH;  Surgeon: Waynetta Sandy, MD;  Location: [facility];  Service: Cardiovascular;  Laterality: N/A;  . LOWER EXTREMITY ANGIOGRAPHY N/A 10/19/2017   Procedure: LOWER EXTREMITY ANGIOGRAPHY - LYSIS RECHECK;  Surgeon: Waynetta Sandy, MD;  Location: [facility];  Service: Cardiovascular;  Laterality: N/A;  . PANCREATIC STENT PLACEMENT  12/21/2018   Procedure: PANCREATIC STENT PLACEMENT;  Surgeon: Clarene Essex, MD;  [facility];  Service: Endoscopy;;  . PEG PLACEMENT N/A 07/20/2019   Procedure: FEEDING TUBE;  Surgeon: [name], MD;  Location: [facility];  Service: General;  Laterality: N/A;  . PERIPHERAL VASCULAR BALLOON ANGIOPLASTY Left 10/19/2017   Procedure: PERIPHERAL VASCULAR BALLOON ANGIOPLASTY;  Surgeon: Waynetta Sandy, MD;  Location: [facility];  Service: Cardiovascular;  Laterality: Left;  Multiple Lower extremity venous sites  . PERIPHERAL VASCULAR INTERVENTION Left 10/18/2017   Procedure: PERIPHERAL VASCULAR INTERVENTION;  Surgeon: Waynetta Sandy, MD;  Location: [facility];  Service: Cardiovascular;  Laterality: Left;  . PORTACATH PLACEMENT Left 02/02/2019   Procedure: INSERTION PORT-A-CATH WITH POSSIBLE ULTRASOUND;  Surgeon: [name], MD;  Location: [facility];  Service: General;  Laterality: Left;  . REMOVAL OF STONES  12/21/2018   Procedure: REMOVAL OF STONES;  Surgeon: Clarene Essex, MD;  [facility];  Service: Endoscopy;;  . Joan Mayans  12/21/2018   Procedure: Joan Mayans;  Surgeon: Clarene Essex, MD;  [facility];  Service: Endoscopy;;  . STENT REMOVAL  01/24/2019   Procedure:  STENT REMOVAL;  Surgeon: Arta Silence, MD;  [facility];  Service: Endoscopy;;  . WHIPPLE PROCEDURE N/A 07/20/2019   Procedure: WHIPPLE PROCEDURE;  Surgeon: [name], MD;  Location: [facility];  Service: General;  Laterality: N/A;     A IV Location/Drains/Wounds Patient Lines/Drains/Airways Status   Active Line/Drains/Airways    Name:   Placement date:   Placement time:   Site:   Days:   Implanted Port 02/02/19 Left Chest   02/02/19    1121    Chest   252   Implanted Port Left Chest   --    --    Chest      Peripheral IV 10/11/19 Left Antecubital   10/11/19    0753    Antecubital   1   Closed System Drain 1 Lateral RLQ Bulb (JP) 10 Fr.   10/12/19    0849    RLQ   less than 1   Gastrostomy/Enterostomy Jejunostomy 16 Fr. Other (comment)   07/20/19    1350    Other (comment)   84   GI Stent    01/24/19    1320    --   261   Incision (Closed) 07/20/19 Abdomen Other (Comment)   07/20/19    0934     84   Incision - 1 Port 1: Left;Medial;Upper   07/20/19    0830     84          Intake/Output Last 24 hours  Intake/Output Summary (Last 24 hours)  at 10/12/2019 1452 Last data filed at 10/12/2019 0900 Gross per 24 hour  Intake 1298.38 ml  Output 975 ml  Net 323.38 ml    Labs/Imaging Results for orders placed or performed during the hospital encounter of 10/11/19 (from the past 48 hour(s))  Lactic acid, plasma     Status: Abnormal   Collection Time: 10/11/19  7:10 AM  Result Value Ref Range   Lactic Acid, Venous 5.2 (HH) 0.5 - 1.9 mmol/L    Comment: CRITICAL RESULT CALLED TO, READ BACK BY AND VERIFIED WITH: GARRISON,G. RN AT [id number] 10/11/19 MULLINS,T Performed at [facility], [place] [address] [place], [place] [address]   Comprehensive metabolic panel     Status: Abnormal   Collection Time: 10/11/19  7:10 AM  Result Value Ref Range   Sodium 130 (L) 135 - 145 mmol/L   Potassium 4.1 3.5 - 5.1 mmol/L   Chloride 96 (L) 98 - 111 mmol/L   CO2 21 (L) 22 - 32  mmol/L   Glucose, Bld 170 (H) 70 - 99 mg/dL   BUN 32 (H) 6 - 20 mg/dL   Creatinine, Ser 1.30 (H) 0.61 - 1.24 mg/dL   Calcium 8.3 (L) 8.9 - 10.3 mg/dL   Total Protein 6.7 6.5 - 8.1 g/dL   Albumin 2.1 (L) 3.5 - 5.0 g/dL   AST 98 (H) 15 - 41 U/L   ALT 72 (H) 0 - 44 U/L   Alkaline Phosphatase 894 (H) 38 - 126 U/L   Total Bilirubin 6.8 (H) 0.3 - 1.2 mg/dL   GFR calc non Af Amer 60 (L) >60 mL/min   GFR calc Af Amer >60 >60 mL/min   Anion gap 13 5 - 15    Comment: Performed at [facility], [place] [address] [place], [place] [address]  CBC WITH DIFFERENTIAL     Status: Abnormal   Collection Time: 10/11/19  7:10 AM  Result Value Ref Range   WBC 3.1 (L) 4.0 - 10.5 K/uL   RBC 4.56 4.22 - 5.81 MIL/uL   Hemoglobin 12.9 (L) 13.0 - 17.0 g/dL   HCT 39.8 39.0 - 52.0 %   MCV 87.3 80.0 - 100.0 fL   MCH 28.3 26.0 - 34.0 pg   MCHC 32.4 30.0 - 36.0 g/dL   RDW 18.6 (H) 11.5 - 15.5 %   Platelets 169 150 - 400 K/uL   nRBC 0.0 0.0 - 0.2 %   Neutrophils Relative % 91 %   Neutro Abs 2.8 1.7 - 7.7 K/uL   Lymphocytes Relative 4 %   Lymphs Abs 0.1 (L) 0.7 - 4.0 K/uL   Monocytes Relative 1 %   Monocytes Absolute 0.0 (L) 0.1 - 1.0 K/uL   Eosinophils Relative 3 %   Eosinophils Absolute 0.1 0.0 - 0.5 K/uL   Basophils Relative 0 %   Basophils Absolute 0.0 0.0 - 0.1 K/uL   Immature Granulocytes 1 %   Abs Immature Granulocytes 0.03 0.00 - 0.07 K/uL   Polychromasia PRESENT     Comment: Performed at [facility], [place] [address] [place], [place] [address]  APTT     Status: None   Collection Time: 10/11/19  7:10 AM  Result Value Ref Range   aPTT 36 24 - 36 seconds    Comment: Performed at [facility], [place] [address], [place],  [address]  Protime-INR     Status: Abnormal   Collection Time: 10/11/19  7:10 AM  Result Value Ref  Range   Prothrombin Time 21.5 (H) 11.4 - 15.2 seconds   INR 1.9 (H) 0.8 - 1.2    Comment: (NOTE) INR goal varies based  on device and disease states. Performed at [facility], [place] [name] [place], [place] [address]   Blood Culture (routine x 2)     Status: None (Preliminary result)   Collection Time: 10/11/19  7:10 AM   Specimen: BLOOD  Result Value Ref Range   Specimen Description      BLOOD PORTA CATH Performed at [place] [address] [place], [place] [address]    Special Requests      BOTTLES DRAWN AEROBIC AND ANAEROBIC Blood Culture adequate volume Performed at [place] [address] [place], [place] [address]    Culture  Setup Time      GRAM POSITIVE COCCI IN CHAINS CRITICAL RESULT CALLED TO, READ BACK BY AND VERIFIED WITH: B GREEN PHARMD 10/12/19 0629 JDW IN BOTH AEROBIC AND ANAEROBIC BOTTLES    Culture      NO GROWTH 1 DAY Performed at [facility], [address] [place], [place] [address]    Report Status PENDING   Lipase, blood     Status: None   Collection Time: 10/11/19  7:10 AM  Result Value Ref Range   Lipase 14 11 - 51 U/L    Comment: Performed at [facility], [place] [address] [place], [place] [address]  Respiratory Panel by RT PCR (Flu A&B, Covid) - Nasopharyngeal Swab     Status: None   Collection Time: 10/11/19  7:11 AM   Specimen: Nasopharyngeal Swab  Result Value Ref Range   SARS Coronavirus 2 by RT PCR NEGATIVE NEGATIVE    Comment: (NOTE) SARS-CoV-2 target nucleic acids are NOT DETECTED. The SARS-CoV-2 RNA is generally detectable in upper respiratoy specimens during the acute phase of infection. The lowest concentration of SARS-CoV-2 viral copies this assay can detect is 131 copies/mL. A negative result does not preclude SARS-Cov-2 infection and should not be used as the sole basis for treatment or other patient management decisions. A negative result may occur with  improper specimen collection/handling, submission of specimen other than nasopharyngeal swab,  presence of viral mutation(s) within the areas targeted by this assay, and inadequate number of viral copies (<131 copies/mL). A negative result must be combined with clinical observations, patient history, and epidemiological information. The expected result is Negative. Fact Sheet for Patients:  PinkCheek.be Fact Sheet for Healthcare Providers:  GravelBags.it This test is not yet ap proved or cleared by the [place] FDA and  has been authorized for detection and/or diagnosis of SARS-CoV-2 by FDA under an Emergency Use Authorization (EUA). This EUA will remain  in effect (meaning this test can be used) for the duration of the COVID-19 declaration under Section 564(b)(1) of the Act, 21 U.S.C. section 360bbb-3(b)(1), unless the authorization is terminated or revoked sooner.    Influenza A by PCR NEGATIVE NEGATIVE   Influenza B by PCR NEGATIVE NEGATIVE    Comment: (NOTE) The Xpert Xpress SARS-CoV-2/FLU/RSV assay is intended as an aid in  the diagnosis of influenza from Nasopharyngeal swab specimens and  should not be used as a sole basis for treatment. Nasal washings and  aspirates are unacceptable for Xpert Xpress SARS-CoV-2/FLU/RSV  testing. Fact Sheet for Patients: PinkCheek.be Fact Sheet for Healthcare Providers: GravelBags.it This test is not yet approved or cleared by the [place] and  has been authorized for detection and/or diagnosis of SARS-CoV-2 by  FDA under an Emergency Use Authorization (EUA). This EUA will remain  in effect (meaning this test can be used) for the duration of the  Covid-19 declaration under Section 564(b)(1) of the Act, 21  U.S.C. section 360bbb-3(b)(1), unless the authorization is  terminated or revoked. Performed at [facility], [place] [address], [place], [place] [address]   Blood Culture (routine x 2)      Status: None (Preliminary result)   Collection Time: 10/11/19  7:15 AM   Specimen: BLOOD  Result Value Ref Range   Specimen Description      BLOOD LEFT ANTECUBITAL Performed at [facility], [place] [address], [place], [place] [address]    Special Requests      BOTTLES DRAWN AEROBIC AND ANAEROBIC Blood Culture adequate volume Performed at [place] [address] [place], [place] [address]    Culture  Setup Time      GRAM POSITIVE COCCI IN CHAINS IN BOTH AEROBIC AND ANAEROBIC BOTTLES CRITICAL RESULT CALLED TO, READ BACK BY AND VERIFIED WITH: PHARMD M RENZ [id number] AT 908 AM  BY CM Performed at [facility], [place] [address] [place], [place] [address]    Culture GRAM POSITIVE COCCI IN CHAINS    Report Status PENDING   Blood Culture ID Panel (Reflexed)     Status: Abnormal   Collection Time: 10/11/19  7:15 AM  Result Value Ref Range   Enterococcus species NOT DETECTED NOT DETECTED   Listeria monocytogenes NOT DETECTED NOT DETECTED   Staphylococcus species NOT DETECTED NOT DETECTED   Staphylococcus aureus (BCID) NOT DETECTED NOT DETECTED   Streptococcus species DETECTED (A) NOT DETECTED    Comment: Not Enterococcus species, Streptococcus agalactiae, Streptococcus pyogenes, or Streptococcus pneumoniae. CRITICAL RESULT CALLED TO, READ BACK BY AND VERIFIED WITH: PHARMD M RENZ 121020 AT 63 BY CM    Streptococcus agalactiae NOT DETECTED NOT DETECTED   Streptococcus pneumoniae NOT DETECTED NOT DETECTED   Streptococcus pyogenes NOT DETECTED NOT DETECTED   Acinetobacter baumannii NOT DETECTED NOT DETECTED   Enterobacteriaceae species NOT DETECTED NOT DETECTED   Enterobacter cloacae complex NOT DETECTED NOT DETECTED   Escherichia coli NOT DETECTED NOT DETECTED   Klebsiella oxytoca NOT DETECTED NOT DETECTED   Klebsiella pneumoniae NOT DETECTED NOT DETECTED   Proteus species NOT DETECTED NOT DETECTED   Serratia marcescens NOT DETECTED NOT  DETECTED   Haemophilus influenzae NOT DETECTED NOT DETECTED   Neisseria meningitidis NOT DETECTED NOT DETECTED   Pseudomonas aeruginosa NOT DETECTED NOT DETECTED   Candida albicans NOT DETECTED NOT DETECTED   Candida glabrata NOT DETECTED NOT DETECTED   Candida krusei NOT DETECTED NOT DETECTED   Candida parapsilosis NOT DETECTED NOT DETECTED   Candida tropicalis NOT DETECTED NOT DETECTED    Comment: Performed at [facility], [place] [address] [place], [place] [address]  Ammonia     Status: Abnormal   Collection Time: 10/11/19  7:21 AM  Result Value Ref Range   Ammonia 40 (H) 9 - 35 umol/L    Comment: Performed at [facility], [place] [address], [place], [place] [address]  Lactic acid, plasma     Status: Abnormal   Collection Time: 10/11/19  9:10 AM  Result Value Ref Range   Lactic Acid, Venous 3.1 (HH) 0.5 - 1.9 mmol/L    Comment: CRITICAL VALUE NOTED.  VALUE IS CONSISTENT WITH PREVIOUSLY REPORTED AND CALLED  VALUE. Performed at [facility], [place] [address] [place], [place] [address]   CBG monitoring, ED     Status: Abnormal   Collection Time: 10/11/19  4:58 PM  Result Value Ref Range   Glucose-Capillary 122 (H) 70 - 99 mg/dL  Hemoglobin A1c     Status: Abnormal   Collection Time: 10/11/19  5:00 PM  Result Value Ref Range   Hgb A1c MFr Bld 8.4 (H) 4.8 - 5.6 %    Comment: (NOTE) Pre diabetes:          5.7%-6.4% Diabetes:              >6.4% Glycemic control for   <7.0% adults with diabetes    Mean Plasma Glucose 194.38 mg/dL    Comment: Performed at [place] [address] [place], [place] [address]  Urinalysis, Routine w reflex microscopic     Status: Abnormal   Collection Time: 10/11/19  6:41 PM  Result Value Ref Range   Color, Urine AMBER (A) YELLOW    Comment: BIOCHEMICALS MAY BE AFFECTED BY COLOR   APPearance HAZY (A) CLEAR   Specific Gravity, Urine >1.046 (H) 1.005 - 1.030   pH 5.0 5.0 - 8.0   Glucose, UA 150 (A)  NEGATIVE mg/dL   Hgb urine dipstick SMALL (A) NEGATIVE   Bilirubin Urine MODERATE (A) NEGATIVE   Ketones, ur NEGATIVE NEGATIVE mg/dL   Protein, ur 30 (A) NEGATIVE mg/dL   Nitrite NEGATIVE NEGATIVE   Leukocytes,Ua NEGATIVE NEGATIVE   RBC / HPF 6-10 0 - 5 RBC/hpf   WBC, UA 6-10 0 - 5 WBC/hpf   Bacteria, UA RARE (A) NONE SEEN   Hyaline Casts, UA PRESENT    Granular Casts, UA PRESENT     Comment: Performed at [facility], [place] [address], [place], [place] [address]  CBG monitoring, ED     Status: Abnormal   Collection Time: 10/11/19  9:53 PM  Result Value Ref Range   Glucose-Capillary 400 (H) 70 - 99 mg/dL  CBG monitoring, ED     Status: Abnormal   Collection Time: 10/11/19 11:48 PM  Result Value Ref Range   Glucose-Capillary 361 (H) 70 - 99 mg/dL  CBG monitoring, ED     Status: Abnormal   Collection Time: 10/12/19  3:54 AM  Result Value Ref Range   Glucose-Capillary 110 (H) 70 - 99 mg/dL  Comprehensive metabolic panel     Status: Abnormal   Collection Time: 10/12/19  4:45 AM  Result Value Ref Range   Sodium 133 (L) 135 - 145 mmol/L   Potassium 4.1 3.5 - 5.1 mmol/L   Chloride 105 98 - 111 mmol/L   CO2 17 (L) 22 - 32 mmol/L   Glucose, Bld 106 (H) 70 - 99 mg/dL   BUN 33 (H) 6 - 20 mg/dL   Creatinine, Ser 1.15 0.61 - 1.24 mg/dL   Calcium 7.5 (L) 8.9 - 10.3 mg/dL   Total Protein 6.0 (L) 6.5 - 8.1 g/dL   Albumin 1.7 (L) 3.5 - 5.0 g/dL   AST 551 (H) 15 - 41 U/L   ALT 184 (H) 0 - 44 U/L   Alkaline Phosphatase 747 (H) 38 - 126 U/L   Total Bilirubin 6.2 (H) 0.3 - 1.2 mg/dL   GFR calc non Af Amer >60 >60 mL/min   GFR calc Af Amer >60 >60 mL/min   Anion gap 11 5 - 15    Comment: Performed at [facility], [place] [address] [place], [place] [address]  CBC     Status: Abnormal   Collection Time: 10/12/19  4:45 AM  Result Value Ref Range   WBC 10.1 4.0 - 10.5 K/uL   RBC 2.50 (L) 4.22 - 5.81 MIL/uL   Hemoglobin 7.2 (L) 13.0 - 17.0 g/dL   HCT 22.3  (L) 39.0 - 52.0 %   MCV 89.2 80.0 - 100.0 fL   MCH 28.8 26.0 - 34.0 pg   MCHC 32.3 30.0 - 36.0 g/dL   RDW 18.6 (H) 11.5 - 15.5 %   Platelets 181 150 - 400 K/uL   nRBC 0.0 0.0 - 0.2 %    Comment: Performed at [facility], [place] [address], [place], [place] [address]  Protime-INR     Status: Abnormal   Collection Time: 10/12/19  4:45 AM  Result Value Ref Range   Prothrombin Time 23.2 (H) 11.4 - 15.2 seconds   INR 2.1 (H) 0.8 - 1.2    Comment: (NOTE) INR goal varies based on device and disease states. Performed at [facility], [place] [address] [place], [place] [address]   CBG monitoring, ED     Status: Abnormal   Collection Time: 10/12/19  7:41 AM  Result Value Ref Range   Glucose-Capillary 103 (H) 70 - 99 mg/dL  Aerobic/Anaerobic Culture (surgical/deep wound)     Status: None (Preliminary result)   Collection Time: 10/12/19  8:58 AM   Specimen: Abscess  Result Value Ref Range   Specimen Description      ABSCESS ABDOMEN Performed at [place] [address] [place], [place] [address]    Special Requests      NONE Performed at [facility], [place] [address] [place], [place] [address]    Gram Stain      ABUNDANT WBC PRESENT, PREDOMINANTLY PMN MODERATE GRAM NEGATIVE COCCOBACILLI Performed at [facility], [place] [address] [place], [place] [address]    Culture PENDING    Report Status PENDING   CBG monitoring, ED     Status: None   Collection Time: 10/12/19 10:44 AM  Result Value Ref Range   Glucose-Capillary 96 70 - 99 mg/dL  CBG monitoring, ED     Status: Abnormal   Collection Time: 10/12/19 12:39 PM  Result Value Ref Range   Glucose-Capillary 127 (H) 70 - 99 mg/dL   CT Angio Chest PE W and/or Wo Contrast  Result Date: 10/11/2019 CLINICAL DATA:  8ml omni350 Pt had whipple procedure done in sept 2020. Pt was to start chemo today. Bilirubin level is going up. Denies any chest pain or  chest pressure. Pt tates has a rapid heart rate. eval for pE EXAM: CT ANGIOGRAPHY CHEST WITH CONTRAST TECHNIQUE: Multidetector CT imaging of the chest was performed using the standard protocol during bolus administration of intravenous contrast. Multiplanar CT image reconstructions and MIPs were obtained to evaluate the vascular anatomy. CONTRAST:  55mL OMNIPAQUE IOHEXOL 350 MG/ML SOLN COMPARISON:  CT abdomen pelvis 10/10/2019 FINDINGS: Cardiovascular: No filling defects within the pulmonary arteries to suggest acute pulmonary embolism. No acute findings of the aorta or great vessels. No pericardial fluid. Mediastinum/Nodes: No axillary supraclavicular adenopathy. No mediastinal hilar adenopathy. No pericardial effusion. Port in the anterior chest wall with tip in distal SVC. Lungs/Pleura: No suspicious pulmonary nodules. No infiltrate Upper Abdomen: Innumerable hepatic metastasis again noted. Musculoskeletal: No aggressive osseous lesion Review of the MIP images confirms the above findings. IMPRESSION:  1. No evidence acute pulmonary embolism. 2. No acute pulmonary parenchymal findings. 3. Innumerable hepatic metastasis. Electronically Signed   By: Suzy Bouchard M.D.   On: 10/11/2019 09:50   CT Abdomen Pelvis W Contrast  Result Date: 10/10/2019 CLINICAL DATA:  59 year old male with history of pancreatic cancer diagnosed in February 2020. Elevated bilirubin. Evaluate for potential biliary obstruction. EXAM: CT ABDOMEN AND PELVIS WITH CONTRAST TECHNIQUE: Multidetector CT imaging of the abdomen and pelvis was performed using the standard protocol following bolus administration of intravenous contrast. CONTRAST:  181mL OMNIPAQUE IOHEXOL 300 MG/ML  SOLN COMPARISON:  CT the abdomen and pelvis 08/24/2019. Abdominal MRI 09/05/2019. FINDINGS: Lower chest: Unremarkable. Hepatobiliary: Marked increase in number and size of what are now innumerable low-intermediate attenuation lesions scattered throughout the hepatic  parenchyma, largest of which measure up to 3.6 x 2.4 cm in segment 4B of the liver (axial image 37 of series 3). Some of these lesions appear elongated within the hepatic parenchyma best appreciated on sagittal image 53 of series 7, where that length of the lesion previously described measures up to 11.6 cm. These lesions are exerting local mass effect upon adjacent structures causing compression of central bile ducts resulting in mild to moderate intrahepatic biliary ductal dilatation throughout the right lobe of the liver. Status post cholecystectomy is part of Whipple procedure. In the gallbladder fossa there is a large amount of intermediate attenuation fluid and/or soft tissue which has a thick rim of peripheral enhancement estimated to measure approximately 6.1 x 6.6 x 3.5 cm (axial image 43 of series 3 and coronal image 33 of series 6). This soft tissue appears intimately associated both with the undersurface of the liver, as well as several adjacent small bowel loops, in close proximity to the presumed location of the hepaticojejunostomy. Previously noted fluid collection beneath the right lobe of the liver (which previously had a percutaneous drainage catheter on prior CT 08/24/2019) is again noted on today's examination, currently measuring 5.7 x 5.3 x 6.9 cm (axial image 51 of series 3 and coronal image 34 of series 6) with thick peripheral rim of soft tissue and enhancement which appears to partially extend into the right oblique musculature best appreciated on axial image 47 of series 3 and coronal image 33 of series 6). Pancreas: Postoperative changes of Whipple procedure. Spleen: Unremarkable. Adrenals/Urinary Tract: 1.5 cm low-attenuation lesion in the upper pole of the right kidney, compatible with a simple cyst. Left kidney and bilateral adrenal glands are normal in appearance. No hydroureteronephrosis. Urinary bladder is normal in appearance. Stomach/Bowel: Postoperative changes of Whipple  procedure. Loops of small bowel and colon traversing beneath the right lobe of the liver appear thickened as they come in close association with the previously noted fluid and/or soft tissue collection in this region. No pathologic dilatation of small bowel or colon. Normal appendix. Vascular/Lymphatic: Aortic atherosclerosis, without evidence of aneurysm or dissection in the abdominal or pelvic vasculature. Small of filling defect in the proximal portal vein (axial image 36 of series 3), concerning for nonobstructive thrombus. Enlarged lymph node immediately anterior to the superior mesenteric vein (axial image 43 of series 3) measuring 1.5 cm in short axis. Reproductive: Prostate gland and seminal vesicles are unremarkable in appearance. Other: Trace volume of perihepatic ascites.  No pneumoperitoneum. Musculoskeletal: There are no aggressive appearing lytic or blastic lesions noted in the visualized portions of the skeleton. IMPRESSION: 1. Marked increased number and size of innumerable hepatic lesions, as well as persistence of right infra hepatic postoperative  fluid collection and development of an additional fluid and/or soft tissue collection beneath the gallbladder fossa. Overall, the findings are favored to reflect progressive metastatic disease to the liver, however, there is overlap with potential infectious etiologies (multiple intrahepatic abscesses and subhepatic abscesses). Guided sampling of one or more of these lesions is suggested to establish a definitive diagnosis. 2. 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 3. Interval development of mildly enlarged mesenteric lymph node measuring 1.5 cm in short axis immediately anterior to the portal vein, suspicious for a metastatic lymph node, although reactive lymphadenopathy in the setting of extensive infection is not excluded. 4. Additional incidental findings, as above. Electronically Signed   By: Vinnie Langton M.D.   On: 10/10/2019  17:34   DG Chest Port 1 View  Result Date: 10/11/2019 CLINICAL DATA:  Chills. Additional history provided: Syncopal episode, history of liver CA. EXAM: PORTABLE CHEST 1 VIEW COMPARISON:  CT angiogram chest 08/07/2019, chest radiograph 08/07/2011 FINDINGS: Left chest infusion port catheter with tip projecting in the region of the superior cavoatrial junction. Heart size within normal limits. No airspace consolidation within the lungs. No evidence of pleural effusion or pneumothorax. No acute bony abnormality. IMPRESSION: No evidence of acute cardiopulmonary abnormality. Electronically Signed   By: Kellie Simmering DO   On: 10/11/2019 07:57   CT IMAGE GUIDED DRAINAGE BY PERCUTANEOUS CATHETER  Result Date: 10/12/2019 INDICATION: 58 year old male with a history of right upper quadrant abscess EXAM: CT GUIDED DRAINAGE OF  ABSCESS MEDICATIONS: The patient is currently admitted to the hospital and receiving intravenous antibiotics. The antibiotics were administered within an appropriate time frame prior to the initiation of the procedure. ANESTHESIA/SEDATION: 2.0 mg IV Versed 100 mcg IV Fentanyl Moderate Sedation Time:  12 minutes The patient was continuously monitored during the procedure by the interventional radiology nurse under my direct supervision. COMPLICATIONS: None TECHNIQUE: Informed written consent was obtained from the patient after a thorough discussion of the procedural risks, benefits and alternatives. All questions were addressed. Maximal Sterile Barrier Technique was utilized including caps, mask, sterile gowns, sterile gloves, sterile drape, hand hygiene and skin antiseptic. A timeout was performed prior to the initiation of the procedure. PROCEDURE: The operative field was prepped with Chlorhexidine in a sterile fashion, and a sterile drape was applied covering the operative field. A sterile gown and sterile gloves were used for the procedure. Local anesthesia was provided with 1% Lidocaine. Scout  CT was acquired. The patient was prepped and draped in usual sterile fashion. 1% lidocaine was used for local anesthesia. Using CT guidance, guide needle was advanced into the abscess of the right upper quadrant. Modified Seldinger technique was used to place a 10 [place] drain. 30 cc of purulent material aspirated for a culture. Drain was attached to bulb suction and sutured in position. Patient tolerated the procedure well and remained hemodynamically stable throughout. No complications were encountered and no significant blood loss. FINDINGS: Scout CT demonstrates abscess of the right upper quadrant, sub a pathic. Final CT demonstrates drainage catheter within the abscess. Culture of 30 cc purulent material sent to the lab. IMPRESSION: Status post CT-guided drainage of right upper quadrant abscess. Signed, Dulcy Fanny. Dellia Nims, RPVI [facility] Electronically Signed   By: Corrie Mckusick D.O.   On: 10/12/2019 09:37    Pending Labs Unresulted Labs (From admission, onward)    Start     Ordered   10/13/19 0500  CBC with Differential/Platelet  Tomorrow morning,  R     10/12/19 1328   10/13/19 0500  Comprehensive metabolic panel  Tomorrow morning,   R     10/12/19 1334   10/11/19 0710  Urine culture  ONCE - STAT,   STAT     10/11/19 0711          Vitals/Pain Today's Vitals   10/12/19 1330 10/12/19 1403 10/12/19 1430 10/12/19 1452  BP: (!) 101/52 102/70 108/70   Pulse: 86 88 84   Resp: 20 (!) 22 (!) 23   Temp:    98 F (36.7 C)  TempSrc:    Oral  SpO2: 99% 99% 99%   PainSc:        Isolation Precautions No active isolations  Medications Medications  acetaminophen (TYLENOL) tablet 650 mg (650 mg Oral Given 10/12/19 1041)  oxyCODONE (Oxy IR/ROXICODONE) immediate release tablet 5 mg (5 mg Oral Given 10/11/19 2323)  prochlorperazine (COMPAZINE) tablet 10 mg (has no administration in time range)  lipase/protease/amylase (CREON)  capsule 36,000 Units (36,000 Units Oral Given 10/12/19 1314)  famotidine (PEPCID) tablet 10 mg (has no administration in time range)  ondansetron (ZOFRAN-ODT) disintegrating tablet 4 mg (has no administration in time range)  ferrous sulfate tablet 325 mg (325 mg Oral Given 10/12/19 1042)  lidocaine (LIDODERM) 5 % 1 patch (1 patch Transdermal Patch Removed 10/12/19 1316)  insulin aspart (novoLOG) injection 0-9 Units (0 Units Subcutaneous Not Given 10/12/19 1304)  0.9 %  sodium chloride infusion ( Intravenous New Bag/Given 10/12/19 0027)  B-complex with vitamin C tablet 1 tablet (1 tablet Oral Given 10/12/19 1314)  Melatonin TABS 10 mg (10 mg Oral Given 10/12/19 0021)  piperacillin-tazobactam (ZOSYN) IVPB 3.375 g (has no administration in time range)  apixaban (ELIQUIS) tablet 5 mg (has no administration in time range)  sodium chloride 0.9 % bolus 1,000 mL (0 mLs Intravenous Stopped 10/11/19 0912)  ceFEPIme (MAXIPIME) 2 g in sodium chloride 0.9 % 100 mL IVPB (0 g Intravenous Stopped 10/11/19 0758)  metroNIDAZOLE (FLAGYL) IVPB 500 mg (0 mg Intravenous Stopped 10/11/19 0912)  vancomycin (VANCOCIN) 1,500 mg in sodium chloride 0.9 % 500 mL IVPB (0 mg Intravenous Stopped 10/11/19 1032)  ibuprofen (ADVIL) tablet 600 mg (600 mg Oral Given 10/11/19 0745)  lactated ringers bolus 1,000 mL (0 mLs Intravenous Stopped 10/11/19 1046)    And  lactated ringers bolus 1,000 mL (0 mLs Intravenous Stopped 10/11/19 1046)  iohexol (OMNIPAQUE) 350 MG/ML injection 100 mL (75 mLs Intravenous Contrast Given 10/11/19 0912)  sodium chloride (PF) 0.9 % injection (  Given by Other 10/11/19 0929)  sodium chloride 0.9 % bolus 1,000 mL (0 mLs Intravenous Stopped 10/11/19 1512)  diphenhydrAMINE (BENADRYL) injection 25 mg (25 mg Intravenous Given 10/12/19 0023)  midazolam (VERSED) injection (1 mg Intravenous Given 10/12/19 0843)  fentaNYL (SUBLIMAZE) injection (50 mcg Intravenous Given 10/12/19 0843)  lidocaine (PF) (XYLOCAINE) 1 %  injection (10 mLs  Given 10/12/19 0844)    Mobility walks Low fall risk   Focused Assessments    R Recommendations: See Admitting Provider Note  Report given to:   Additional Notes:

## 2019-10-12 NOTE — ED Notes (Signed)
Pt taken to IR

## 2019-10-12 NOTE — Progress Notes (Signed)
PROGRESS NOTE    Brian Torres  [id number] DOB: 1960/12/17 DOA: 10/11/2019 PCP: Cari Caraway, MD   Brief Narrative:  Patient is a 58-year male with history of metastatic pancreatic cancer, diabetes type 2, hyperlipidemia, hypertension, depression, history of DVT on Eliquis who presented from home with complaints of fever, chills.  He has history of metastatic pancreatic cancer with liver mets and underwent Whipple's procedure recently. He was also admitted on October and was found to have intraabdominal  abscesses . On this admission, it was reported that he was gradually declining over the past 2 weeks.  Developed fever, chills, diarrhea, abdominal discomfort.  He was hypotensive on arrival.  CT abdomen/pelvis done in the emergency department showed numerous liver metastasis, possible abscesses.  Started on broad-spectrum antibiotics.  IR consulted and he underwent IR guided drainage placement.9 Now blood cultures growing streptococcal species.ID consulted.  Assessment & Plan:   Active Problems:   Sepsis ([place])   AKI (acute kidney injury) ([place])   Sepsis/gram-positive bacteremia: Presented with fever, tachycardia, tachypnea, elevated lactic acid level. Currently hemodynamically stable.  Sepsis secondary to liver abscess.  Blood cultures showing Streptococcus species.  Antibiotics narrowed to Zosyn.  Will follow final culture and sensitivity.  ID consulted  Liver abscess: Underwent CT-guided right upper quadrant drain placement for abscess on 10/12/19.  CT abdomen showed numerous liver mets and abscesses.  Continue current antibiotics.  He was hospitalized from 08/07/2019 through 08/11/2019 with postsurgical intra-abdominal fluid collection.  CT at that time showed 2 rim-enhancing fluid collection in the right upper quadrant concerning for abscess, colitis.  Blood cultures had shown Prevotella.  Underwent CT-guided aspiration of intra-abdominal fluid collection and placement of drain  on  08/09/2019.  He was discharged on Augmentin at that time.  IR will remove the drain.  Metastatic pancreatic adenocarcinoma: Follows with Dr. Learta Codding.  I stage IV cancer.Marland Kitchen  Has worsening LFTs.  Has metastatic liver disease.  As per oncology, there was plan to begin salvage chemotherapy with gemcitabine/Abraxane on 10/09/2019.  Chemotherapy held due to new hyperbilirubinemia. History of Whipple's procedure on 07/10/19.  Has abdominal wound that is healing.Monitor CMP  AKI: Improved with IV fluids  Diabetes mellitus 2: Continue current insulin regimen.  On antihyperglycemics at home.  Hypertension: Hypotensive on arrival.  Antihypertensives held  History of DVT: On Eliquis.  Resume from tomorrow.          DVT prophylaxis: Eliquis Code Status: Full Family Communication: None present at the bedside Disposition Plan: Home after full work-up   Consultants: Oncology, IR, ID  Procedures: Right upper quadrant drain placement  Antimicrobials:  Anti-infectives (From admission, onward)   Start     Dose/Rate Route Frequency Ordered Stop   10/11/19 2000  vancomycin (VANCOCIN) IVPB 1000 mg/200 mL premix     1,000 mg 200 mL/hr over 60 Minutes Intravenous Every 12 hours 10/11/19 1242     10/11/19 1600  ceFEPIme (MAXIPIME) 2 g in sodium chloride 0.9 % 100 mL IVPB     2 g 200 mL/hr over 30 Minutes Intravenous Every 8 hours 10/11/19 1242     10/11/19 1400  metroNIDAZOLE (FLAGYL) IVPB 500 mg     500 mg 100 mL/hr over 60 Minutes Intravenous Every 8 hours 10/11/19 1206     10/11/19 0730  vancomycin (VANCOCIN) 1,500 mg in sodium chloride 0.9 % 500 mL IVPB     1,500 mg 250 mL/hr over 120 Minutes Intravenous  Once 10/11/19 0716 10/11/19 1032   10/11/19 0715  ceFEPIme (  MAXIPIME) 2 g in sodium chloride 0.9 % 100 mL IVPB     2 g 200 mL/hr over 30 Minutes Intravenous  Once 10/11/19 0714 10/11/19 0758   10/11/19 0715  metroNIDAZOLE (FLAGYL) IVPB 500 mg     500 mg 100 mL/hr over 60 Minutes Intravenous   Once 10/11/19 0714 10/11/19 0912   10/11/19 0715  vancomycin (VANCOCIN) IVPB 1000 mg/200 mL premix  Status:  Discontinued     1,000 mg 200 mL/hr over 60 Minutes Intravenous  Once 10/11/19 [id number] 10/11/19 0716      Subjective:  Patient was seen and examined the bedside this morning in the emergency department.  He just returned from IR drain placement.  He was sleepy, drowsy.  He was hemodynamically stable during my evaluation.  Denied any abdominal pain.  There was a drain on his right upper quadrant which was draining sanguinous fluid mixed with pus  Objective: Vitals:   10/12/19 1130 10/12/19 1200 10/12/19 1230 10/12/19 1300  BP: (!) 102/56 136/70 (!) 100/52 116/64  Pulse: 93 95 85 (!) 102  Resp: (!) 26 18 (!) 22 (!) 21  Temp:      TempSrc:      SpO2: 98% 96% 96% 98%    Intake/Output Summary (Last 24 hours) at 10/12/2019 1334 Last data filed at 10/12/2019 0900 Gross per 24 hour  Intake 1298.38 ml  Output 975 ml  Net 323.38 ml   There were no vitals filed for this visit.  Examination:  General exam: Not in distress  HEENT:PERRL,Oral mucosa moist, Ear/Nose normal on gross exam Respiratory system: Bilateral equal air entry, normal vesicular breath sounds, no wheezes or crackles  Cardiovascular system: S1 & S2 heard, RRR. No JVD, murmurs, rubs, gallops or clicks. No pedal edema. Gastrointestinal system: Abdomen is nondistended, soft and nontender.  Normal bowel sounds heard.There was a drain on his right upper quadrant which was draining sanguinous fluid mixed with pus Central nervous system: Alert and oriented. No focal neurological deficits. Extremities: No edema, no clubbing ,no cyanosis, distal peripheral pulses palpable. Skin: No rashes, lesions or ulcers,no icterus ,no pallor   Data Reviewed: I have personally reviewed following labs and imaging studies  CBC: Recent Labs  Lab 10/09/19 1024 10/11/19 0710 10/12/19 0445  WBC 13.0* 3.1* 10.1  NEUTROABS 10.5* 2.8  --    HGB 8.8* 12.9* 7.2*  HCT 26.6* 39.8 22.3*  MCV 87.2 87.3 89.2  PLT 268 169 0000000   Basic Metabolic Panel: Recent Labs  Lab 10/09/19 1024 10/11/19 0710 10/12/19 0445  NA 131* 130* 133*  K 5.1 4.1 4.1  CL 96* 96* 105  CO2 25 21* 17*  GLUCOSE 245* 170* 106*  BUN 21* 32* 33*  CREATININE 1.09 1.30* 1.15  CALCIUM 8.7* 8.3* 7.5*   GFR: Estimated Creatinine Clearance: 80.9 mL/min (by C-G formula based on SCr of 1.15 mg/dL). Liver Function Tests: Recent Labs  Lab 10/09/19 1024 10/11/19 0710 10/12/19 0445  AST 120* 98* 551*  ALT 81* 72* 184*  ALKPHOS 1,292* 894* 747*  BILITOT 5.0* 6.8* 6.2*  PROT 7.3 6.7 6.0*  ALBUMIN 2.2* 2.1* 1.7*   Recent Labs  Lab 10/11/19 0710  LIPASE 14   Recent Labs  Lab 10/11/19 0721  AMMONIA 40*   Coagulation Profile: Recent Labs  Lab 10/11/19 0710 10/12/19 0445  INR 1.9* 2.1*   Cardiac Enzymes: No results for input(s): CKTOTAL, CKMB, CKMBINDEX, TROPONINI in the last 168 hours. BNP (last 3 results) No results for input(s): PROBNP in  the last 8760 hours. HbA1C: Recent Labs    10/11/19 1700  HGBA1C 8.4*   CBG: Recent Labs  Lab 10/11/19 2348 10/12/19 0354 10/12/19 0741 10/12/19 1044 10/12/19 1239  GLUCAP 361* 110* 103* 96 127*   Lipid Profile: No results for input(s): CHOL, HDL, LDLCALC, TRIG, CHOLHDL, LDLDIRECT in the last 72 hours. Thyroid Function Tests: No results for input(s): TSH, T4TOTAL, FREET4, T3FREE, THYROIDAB in the last 72 hours. Anemia Panel: No results for input(s): VITAMINB12, FOLATE, FERRITIN, TIBC, IRON, RETICCTPCT in the last 72 hours. Sepsis Labs: Recent Labs  Lab 10/11/19 0710 10/11/19 0910  LATICACIDVEN 5.2* 3.1*    Recent Results (from the past 240 hour(s))  Blood Culture (routine x 2)     Status: None (Preliminary result)   Collection Time: 10/11/19  7:10 AM   Specimen: BLOOD  Result Value Ref Range Status   Specimen Description   Final    BLOOD PORTA CATH Performed at [facility] [address] [place], [place] [address]    Special Requests   Final    BOTTLES DRAWN AEROBIC AND ANAEROBIC Blood Culture adequate volume Performed at [facility] [address], [place], [place] [address]    Culture  Setup Time   Final    GRAM POSITIVE COCCI IN CHAINS CRITICAL RESULT CALLED TO, READ BACK BY AND VERIFIED WITH: B GREEN PHARMD 10/12/19 0629 JDW IN BOTH AEROBIC AND ANAEROBIC BOTTLES    Culture   Final    NO GROWTH 1 DAY Performed at [facility], [address], [place], [place] [address]    Report Status PENDING  Incomplete  Respiratory Panel by RT PCR (Flu A&B, Covid) - Nasopharyngeal Swab     Status: None   Collection Time: 10/11/19  7:11 AM   Specimen: Nasopharyngeal Swab  Result Value Ref Range Status   SARS Coronavirus 2 by RT PCR NEGATIVE NEGATIVE Final    Comment: (NOTE) SARS-CoV-2 target nucleic acids are NOT DETECTED. The SARS-CoV-2 RNA is generally detectable in upper respiratoy specimens during the acute phase of infection. The lowest concentration of SARS-CoV-2 viral copies this assay can detect is 131 copies/mL. A negative result does not preclude SARS-Cov-2 infection and should not be used as the sole basis for treatment or other patient management decisions. A negative result may occur with  improper specimen collection/handling, submission of specimen other than nasopharyngeal swab, presence of viral mutation(s) within the areas targeted by this assay, and inadequate number of viral copies (<131 copies/mL). A negative result must be combined with clinical observations, patient history, and epidemiological information. The expected result is Negative. Fact Sheet for Patients:  PinkCheek.be Fact Sheet for Healthcare Providers:  GravelBags.it This test is not yet ap proved or cleared by the [place] FDA and  has been authorized for  detection and/or diagnosis of SARS-CoV-2 by FDA under an Emergency Use Authorization (EUA). This EUA will remain  in effect (meaning this test can be used) for the duration of the COVID-19 declaration under Section 564(b)(1) of the Act, 21 U.S.C. section 360bbb-3(b)(1), unless the authorization is terminated or revoked sooner.    Influenza A by PCR NEGATIVE NEGATIVE Final   Influenza B by PCR NEGATIVE NEGATIVE Final    Comment: (NOTE) The Xpert Xpress SARS-CoV-2/FLU/RSV assay is intended as an aid in  the diagnosis of influenza from Nasopharyngeal swab specimens and  should not be used as a sole basis for treatment. Nasal washings and  aspirates are unacceptable  for Xpert Xpress SARS-CoV-2/FLU/RSV  testing. Fact Sheet for Patients: PinkCheek.be Fact Sheet for Healthcare Providers: GravelBags.it This test is not yet approved or cleared by the [place] FDA and  has been authorized for detection and/or diagnosis of SARS-CoV-2 by  FDA under an Emergency Use Authorization (EUA). This EUA will remain  in effect (meaning this test can be used) for the duration of the  Covid-19 declaration under Section 564(b)(1) of the Act, 21  U.S.C. section 360bbb-3(b)(1), unless the authorization is  terminated or revoked. Performed at [facility], [place] [address] [place], [place] [address]   Blood Culture (routine x 2)     Status: None (Preliminary result)   Collection Time: 10/11/19  7:15 AM   Specimen: BLOOD  Result Value Ref Range Status   Specimen Description   Final    BLOOD LEFT ANTECUBITAL Performed at [facility] [address] [place], [place] [address]    Special Requests   Final    BOTTLES DRAWN AEROBIC AND ANAEROBIC Blood Culture adequate volume Performed at [facility] [address] [place], [place] [address]    Culture  Setup Time   Final    GRAM POSITIVE  COCCI IN CHAINS IN BOTH AEROBIC AND ANAEROBIC BOTTLES CRITICAL RESULT CALLED TO, READ BACK BY AND VERIFIED WITH: PHARMD M RENZ 121020 AT 908 AM  BY CM Performed at [facility], [place] [address], [place], [place] [address]    Culture GRAM POSITIVE COCCI IN CHAINS  Final   Report Status PENDING  Incomplete  Blood Culture ID Panel (Reflexed)     Status: Abnormal   Collection Time: 10/11/19  7:15 AM  Result Value Ref Range Status   Enterococcus species NOT DETECTED NOT DETECTED Final   Listeria monocytogenes NOT DETECTED NOT DETECTED Final   Staphylococcus species NOT DETECTED NOT DETECTED Final   Staphylococcus aureus (BCID) NOT DETECTED NOT DETECTED Final   Streptococcus species DETECTED (A) NOT DETECTED Final    Comment: Not Enterococcus species, Streptococcus agalactiae, Streptococcus pyogenes, or Streptococcus pneumoniae. CRITICAL RESULT CALLED TO, READ BACK BY AND VERIFIED WITH: PHARMD M RENZ 121020 AT 89 BY CM    Streptococcus agalactiae NOT DETECTED NOT DETECTED Final   Streptococcus pneumoniae NOT DETECTED NOT DETECTED Final   Streptococcus pyogenes NOT DETECTED NOT DETECTED Final   Acinetobacter baumannii NOT DETECTED NOT DETECTED Final   Enterobacteriaceae species NOT DETECTED NOT DETECTED Final   Enterobacter cloacae complex NOT DETECTED NOT DETECTED Final   Escherichia coli NOT DETECTED NOT DETECTED Final   Klebsiella oxytoca NOT DETECTED NOT DETECTED Final   Klebsiella pneumoniae NOT DETECTED NOT DETECTED Final   Proteus species NOT DETECTED NOT DETECTED Final   Serratia marcescens NOT DETECTED NOT DETECTED Final   Haemophilus influenzae NOT DETECTED NOT DETECTED Final   Neisseria meningitidis NOT DETECTED NOT DETECTED Final   Pseudomonas aeruginosa NOT DETECTED NOT DETECTED Final   Candida albicans NOT DETECTED NOT DETECTED Final   Candida glabrata NOT DETECTED NOT DETECTED Final   Candida krusei NOT DETECTED NOT DETECTED Final   Candida parapsilosis NOT DETECTED  NOT DETECTED Final   Candida tropicalis NOT DETECTED NOT DETECTED Final    Comment: Performed at [facility], [place]. [address] [place], [place] [address]         Radiology Studies: CT Angio Chest PE W and/or Wo Contrast  Result Date: 10/11/2019 CLINICAL DATA:  37ml [id number] Pt had whipple procedure done in sept  2020. Pt was to start chemo today. Bilirubin level is going up. Denies any chest pain or chest pressure. Pt tates has a rapid heart rate. eval for pE EXAM: CT ANGIOGRAPHY CHEST WITH CONTRAST TECHNIQUE: Multidetector CT imaging of the chest was performed using the standard protocol during bolus administration of intravenous contrast. Multiplanar CT image reconstructions and MIPs were obtained to evaluate the vascular anatomy. CONTRAST:  82mL OMNIPAQUE IOHEXOL 350 MG/ML SOLN COMPARISON:  CT abdomen pelvis 10/10/2019 FINDINGS: Cardiovascular: No filling defects within the pulmonary arteries to suggest acute pulmonary embolism. No acute findings of the aorta or great vessels. No pericardial fluid. Mediastinum/Nodes: No axillary supraclavicular adenopathy. No mediastinal hilar adenopathy. No pericardial effusion. Port in the anterior chest wall with tip in distal SVC. Lungs/Pleura: No suspicious pulmonary nodules. No infiltrate Upper Abdomen: Innumerable hepatic metastasis again noted. Musculoskeletal: No aggressive osseous lesion Review of the MIP images confirms the above findings. IMPRESSION: 1. No evidence acute pulmonary embolism. 2. No acute pulmonary parenchymal findings. 3. Innumerable hepatic metastasis. Electronically Signed   By: Suzy Bouchard M.D.   On: 10/11/2019 09:50   CT Abdomen Pelvis W Contrast  Result Date: 10/10/2019 CLINICAL DATA:  58 year old male with history of pancreatic cancer diagnosed in February 2020. Elevated bilirubin. Evaluate for potential biliary obstruction. EXAM: CT ABDOMEN AND PELVIS WITH CONTRAST TECHNIQUE: Multidetector CT imaging of the abdomen and  pelvis was performed using the standard protocol following bolus administration of intravenous contrast. CONTRAST:  131mL OMNIPAQUE IOHEXOL 300 MG/ML  SOLN COMPARISON:  CT the abdomen and pelvis 08/24/2019. Abdominal MRI 09/05/2019. FINDINGS: Lower chest: Unremarkable. Hepatobiliary: Marked increase in number and size of what are now innumerable low-intermediate attenuation lesions scattered throughout the hepatic parenchyma, largest of which measure up to 3.6 x 2.4 cm in segment 4B of the liver (axial image 37 of series 3). Some of these lesions appear elongated within the hepatic parenchyma best appreciated on sagittal image 53 of series 7, where that length of the lesion previously described measures up to 11.6 cm. These lesions are exerting local mass effect upon adjacent structures causing compression of central bile ducts resulting in mild to moderate intrahepatic biliary ductal dilatation throughout the right lobe of the liver. Status post cholecystectomy is part of Whipple procedure. In the gallbladder fossa there is a large amount of intermediate attenuation fluid and/or soft tissue which has a thick rim of peripheral enhancement estimated to measure approximately 6.1 x 6.6 x 3.5 cm (axial image 43 of series 3 and coronal image 33 of series 6). This soft tissue appears intimately associated both with the undersurface of the liver, as well as several adjacent small bowel loops, in close proximity to the presumed location of the hepaticojejunostomy. Previously noted fluid collection beneath the right lobe of the liver (which previously had a percutaneous drainage catheter on prior CT 08/24/2019) is again noted on today's examination, currently measuring 5.7 x 5.3 x 6.9 cm (axial image 51 of series 3 and coronal image 34 of series 6) with thick peripheral rim of soft tissue and enhancement which appears to partially extend into the right oblique musculature best appreciated on axial image 47 of series 3 and  coronal image 33 of series 6). Pancreas: Postoperative changes of Whipple procedure. Spleen: Unremarkable. Adrenals/Urinary Tract: 1.5 cm low-attenuation lesion in the upper pole of the right kidney, compatible with a simple cyst. Left kidney and bilateral adrenal glands are normal in appearance. No hydroureteronephrosis. Urinary bladder is normal in appearance. Stomach/Bowel: Postoperative changes of  Whipple procedure. Loops of small bowel and colon traversing beneath the right lobe of the liver appear thickened as they come in close association with the previously noted fluid and/or soft tissue collection in this region. No pathologic dilatation of small bowel or colon. Normal appendix. Vascular/Lymphatic: Aortic atherosclerosis, without evidence of aneurysm or dissection in the abdominal or pelvic vasculature. Small of filling defect in the proximal portal vein (axial image 36 of series 3), concerning for nonobstructive thrombus. Enlarged lymph node immediately anterior to the superior mesenteric vein (axial image 43 of series 3) measuring 1.5 cm in short axis. Reproductive: Prostate gland and seminal vesicles are unremarkable in appearance. Other: Trace volume of perihepatic ascites.  No pneumoperitoneum. Musculoskeletal: There are no aggressive appearing lytic or blastic lesions noted in the visualized portions of the skeleton. IMPRESSION: 1. Marked increased number and size of innumerable hepatic lesions, as well as persistence of right infra hepatic postoperative fluid collection and development of an additional fluid and/or soft tissue collection beneath the gallbladder fossa. Overall, the findings are favored to reflect progressive metastatic disease to the liver, however, there is overlap with potential infectious etiologies (multiple intrahepatic abscesses and subhepatic abscesses). Guided sampling of one or more of these lesions is suggested to establish a definitive diagnosis. 2. 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 3. Interval development of mildly enlarged mesenteric lymph node measuring 1.5 cm in short axis immediately anterior to the portal vein, suspicious for a metastatic lymph node, although reactive lymphadenopathy in the setting of extensive infection is not excluded. 4. Additional incidental findings, as above. Electronically Signed   By: Vinnie Langton M.D.   On: 10/10/2019 17:34   DG Chest Port 1 View  Result Date: 10/11/2019 CLINICAL DATA:  [place]. Additional history provided: Syncopal episode, history of liver CA. EXAM: PORTABLE CHEST 1 VIEW COMPARISON:  CT angiogram chest 08/07/2019, chest radiograph 08/07/2011 FINDINGS: Left chest infusion port catheter with tip projecting in the region of the superior cavoatrial junction. Heart size within normal limits. No airspace consolidation within the lungs. No evidence of pleural effusion or pneumothorax. No acute bony abnormality. IMPRESSION: No evidence of acute cardiopulmonary abnormality. Electronically Signed   By: Kellie Simmering DO   On: 10/11/2019 07:57   CT IMAGE GUIDED DRAINAGE BY PERCUTANEOUS CATHETER  Result Date: 10/12/2019 INDICATION: 58 year old male with a history of right upper quadrant abscess EXAM: CT GUIDED DRAINAGE OF  ABSCESS MEDICATIONS: The patient is currently admitted to the hospital and receiving intravenous antibiotics. The antibiotics were administered within an appropriate time frame prior to the initiation of the procedure. ANESTHESIA/SEDATION: 2.0 mg IV Versed 100 mcg IV Fentanyl Moderate Sedation Time:  12 minutes The patient was continuously monitored during the procedure by the interventional radiology nurse under my direct supervision. COMPLICATIONS: None TECHNIQUE: Informed written consent was obtained from the patient after a thorough discussion of the procedural risks, benefits and alternatives. All questions were addressed. Maximal Sterile Barrier Technique was utilized including caps,  mask, sterile gowns, sterile gloves, sterile drape, hand hygiene and skin antiseptic. A timeout was performed prior to the initiation of the procedure. PROCEDURE: The operative field was prepped with Chlorhexidine in a sterile fashion, and a sterile drape was applied covering the operative field. A sterile gown and sterile gloves were used for the procedure. Local anesthesia was provided with 1% Lidocaine. Scout CT was acquired. The patient was prepped and draped in usual sterile fashion. 1% lidocaine was used for local anesthesia. Using CT guidance, guide  needle was advanced into the abscess of the right upper quadrant. Modified Seldinger technique was used to place a 10 [place] drain. 30 cc of purulent material aspirated for a culture. Drain was attached to bulb suction and sutured in position. Patient tolerated the procedure well and remained hemodynamically stable throughout. No complications were encountered and no significant blood loss. FINDINGS: Scout CT demonstrates abscess of the right upper quadrant, sub a pathic. Final CT demonstrates drainage catheter within the abscess. Culture of 30 cc purulent material sent to the lab. IMPRESSION: Status post CT-guided drainage of right upper quadrant abscess. Signed, Dulcy Fanny. Dellia Nims, RPVI [facility] Radiology Electronically Signed   By: Corrie Mckusick D.O.   On: 10/12/2019 09:37        Scheduled Meds: . B-complex with vitamin C  1 tablet Oral Daily  . ferrous sulfate  325 mg Oral BID WC  . insulin aspart  0-9 Units Subcutaneous Q4H  . lidocaine  1 patch Transdermal QHS  . lipase/protease/amylase  36,000 Units Oral TID AC  . omega-3 acid ethyl esters  1 g Oral Daily   Continuous Infusions: . sodium chloride 125 mL/hr at 10/12/19 0027  . ceFEPime (MAXIPIME) IV Stopped (10/12/19 1159)  . metronidazole 500 mg (10/12/19 1312)  . vancomycin 1,000 mg (10/12/19 1158)     LOS: 1 day    Time spent:  35 mins.More than 50% of that time was spent in counseling and/or coordination of care.      Shelly Coss, MD [facility] Pager [phone]  If 7PM-7AM, please contact night-coverage [email or web address] Password [email or web address] 10/12/2019, 1:34 PM

## 2019-10-12 NOTE — Progress Notes (Signed)
Temp 102.1. Paged on-call provider. Will continue to monitor.

## 2019-10-12 NOTE — Progress Notes (Addendum)
HEMATOLOGY-ONCOLOGY PROGRESS NOTE  SUBJECTIVE: The patient was seen in the emergency room this morning.  He is awaiting bed placement.  Still having fevers.  Denies chills.  He just returned from interventional radiology after having a CT-guided right upper quadrant drain placement.  Fluid has been sent for culture.  He is somewhat sedated when seen this morning.  He denies pain.  Blood cultures from Port-A-Cath and peripheral site positive for gram-positive cocci in chains in the anaerobic bottle only.  Organism ID and sensitivity in process.  He remains on IV cefepime, metronidazole, and vancomycin.  Oncology History  Cancer of head of pancreas ([place])  01/26/2019 Initial Diagnosis   Cancer of head of pancreas ([place])   01/26/2019 Cancer Staging   Staging form: Exocrine Pancreas, AJCC 8th Edition - Clinical: Stage Unknown (cTX, cN0, cM0) - Signed by Ladell Pier, MD on 01/26/2019   02/06/2019 - 06/19/2019 Chemotherapy   The patient had palonosetron (ALOXI) injection 0.25 mg, 0.25 mg, Intravenous,  Once, 8 of 8 cycles Administration: 0.25 mg (02/06/2019), 0.25 mg (02/20/2019), 0.25 mg (03/09/2019), 0.25 mg (03/21/2019), 0.25 mg (04/05/2019), 0.25 mg (04/24/2019), 0.25 mg (05/22/2019), 0.25 mg (06/06/2019) pegfilgrastim-cbqv (UDENYCA) injection 6 mg, 6 mg, Subcutaneous, Once, 2 of 2 cycles Administration: 6 mg (02/22/2019) irinotecan (CAMPTOSAR) 340 mg in dextrose 5 % 500 mL chemo infusion, 150 mg/m2 = 340 mg (100 % of original dose 150 mg/m2), Intravenous,  Once, 8 of 8 cycles Dose modification: 150 mg/m2 (original dose 150 mg/m2, Cycle 1, Reason: Provider Judgment), 120 mg/m2 (original dose 150 mg/m2, Cycle 3, Reason: Provider Judgment) Administration: 340 mg (02/06/2019), 340 mg (02/20/2019), 260 mg (03/09/2019), 260 mg (03/21/2019), 260 mg (04/05/2019), 260 mg (04/24/2019), 260 mg (05/22/2019), 260 mg (06/06/2019) leucovorin 892 mg in dextrose 5 % 250 mL infusion, 400 mg/m2 = 892 mg, Intravenous,  Once, 8 of 8  cycles Dose modification: 300 mg/m2 (original dose 400 mg/m2, Cycle 3, Reason: Provider Judgment) Administration: 892 mg (02/06/2019), 892 mg (02/20/2019), 670 mg (03/21/2019), 670 mg (03/09/2019), 670 mg (04/05/2019), 642 mg (04/24/2019), 642 mg (05/22/2019), 642 mg (06/06/2019) oxaliplatin (ELOXATIN) 200 mg in dextrose 5 % 500 mL chemo infusion, 190 mg, Intravenous,  Once, 8 of 8 cycles Dose modification: 65 mg/m2 (original dose 85 mg/m2, Cycle 6, Reason: Provider Judgment) Administration: 200 mg (02/06/2019), 200 mg (02/20/2019), 200 mg (03/09/2019), 200 mg (03/21/2019), 200 mg (04/05/2019), 140 mg (04/24/2019), 140 mg (05/22/2019), 140 mg (06/06/2019) fosaprepitant (EMEND) 150 mg, dexamethasone (DECADRON) 10 mg in sodium chloride 0.9 % 145 mL IVPB, , Intravenous,  Once, 8 of 8 cycles Administration:  (02/06/2019),  (02/20/2019),  (03/09/2019),  (03/21/2019),  (04/05/2019),  (04/24/2019),  (05/22/2019),  (06/06/2019) fluorouracil (ADRUCIL) 5,350 mg in sodium chloride 0.9 % 143 mL chemo infusion, 2,400 mg/m2 = 5,350 mg, Intravenous, 1 Day/Dose, 8 of 8 cycles Dose modification: 1,800 mg/m2 (original dose 2,400 mg/m2, Cycle 3, Reason: Provider Judgment) Administration: 5,350 mg (02/06/2019), 5,350 mg (02/20/2019), 4,000 mg (03/09/2019), 4,000 mg (03/21/2019), 4,000 mg (04/05/2019), 3,850 mg (04/24/2019), 3,850 mg (05/22/2019), 3,850 mg (06/06/2019)  for chemotherapy treatment.    05/09/2019 Genetic Testing   Negative genetic testing on the CustomNext-Cancer+RNAinsight.  The CustomNext-Expanded gene panel offered by [facility] and includes sequencing and rearrangement analysis for the following 81 genes: AIP, ALK, APC*, ATM*, AXIN2, BAP1, BARD1, BLM, BMPR1A, BRCA1*, BRCA2*, BRIP1*, CDC73, CDH1*, CDK4, CDKN1B, CDKN2A, CHEK2*, CTNNA1, DICER1, FANCC, FH, FLCN, GALNT12, HOXB13, KIT, MAX, MEN1, MET, MLH1*, MRE11A, MSH2*, MSH6*, MUTYH*, NBN, NF1*, NF2, NTHL1, PALB2*, PDGFRA,  PHOX2B, PMS2*, POLD1, POLE, POT1, PRKAR1A, PTCH1, PTEN*, RAD50, RAD51C*,  RAD51D*, RB1, RET, SDHA, SDHAF2, SDHB, SDHC, SDHD, SMAD4, SMARCA4, SMARCB1, SMARCE1, STK11, SUFU, TMEM127, TP53*, TSC1, TSC2, VHL and XRCC2 (sequencing and deletion/duplication); CASR, CFTR, CPA1, CTRC, EGFR, MITF, PRSS1 and SPINK1 (sequencing only); EPCAM and GREM1 (deletion/duplication only). DNA and RNA analyses performed for * genes. The report date is May 09, 2019.   10/09/2019 -  Chemotherapy   The patient had PACLitaxel-protein bound (ABRAXANE) chemo infusion 200 mg, 100 mg/m2 = 200 mg (100 % of original dose 100 mg/m2), Intravenous,  Once, 0 of 4 cycles Dose modification: 100 mg/m2 (original dose 100 mg/m2, Cycle 1, Reason: Provider Judgment) gemcitabine (GEMZAR) 1,672 mg in sodium chloride 0.9 % 250 mL chemo infusion, 800 mg/m2 = 1,672 mg (100 % of original dose 800 mg/m2), Intravenous,  Once, 0 of 4 cycles Dose modification: 800 mg/m2 (original dose 800 mg/m2, Cycle 1, Reason: Provider Judgment)  for chemotherapy treatment.    Pancreatic cancer ([place])  07/20/2019 Initial Diagnosis   Pancreatic cancer ([place])   10/09/2019 -  Chemotherapy   The patient had PACLitaxel-protein bound (ABRAXANE) chemo infusion 200 mg, 100 mg/m2 = 200 mg (100 % of original dose 100 mg/m2), Intravenous,  Once, 0 of 4 cycles Dose modification: 100 mg/m2 (original dose 100 mg/m2, Cycle 1, Reason: Provider Judgment) gemcitabine (GEMZAR) 1,672 mg in sodium chloride 0.9 % 250 mL chemo infusion, 800 mg/m2 = 1,672 mg (100 % of original dose 800 mg/m2), Intravenous,  Once, 0 of 4 cycles Dose modification: 800 mg/m2 (original dose 800 mg/m2, Cycle 1, Reason: Provider Judgment)  for chemotherapy treatment.       PHYSICAL EXAMINATION:  Vitals:   10/12/19 0500 10/12/19 0652  BP: 119/65 (!) 125/59  Pulse: (!) 110 (!) 121  Resp: (!) 32 (!) 30  Temp:  (!) 102.1 F (38.9 C)  SpO2: 98% 96%   There were no vitals filed for this visit.  Intake/Output from previous day: 12/09 0701 - 12/10 0700 In: 5100 [IV  Piggyback:5100] Out: 950 [Urine:950]  GENERAL: Somewhat sedated after procedure this morning but able to converse, appears comfortable SKIN: Jaundice noted LUNGS: clear to auscultation and percussion with normal breathing effort HEART: regular rate & rhythm and no murmurs and no lower extremity edema ABDOMEN: Right-sided drain in place.,  Fullness throughout the right upper abdomen, midline incision is healed with no surrounding erythema Musculoskeletal:no cyanosis of digits and no clubbing  NEURO: alert & oriented x 3 with fluent speech, no focal motor/sensory deficits  Port-A-Cath without erythema  LABORATORY DATA:  I have reviewed the data as listed CMP Latest Ref Rng & Units 10/12/2019 10/11/2019 10/09/2019  Glucose 70 - 99 mg/dL 106(H) 170(H) 245(H)  BUN 6 - 20 mg/dL 33(H) 32(H) 21(H)  Creatinine 0.61 - 1.24 mg/dL 1.15 1.30(H) 1.09  Sodium 135 - 145 mmol/L 133(L) 130(L) 131(L)  Potassium 3.5 - 5.1 mmol/L 4.1 4.1 5.1  Chloride 98 - 111 mmol/L 105 96(L) 96(L)  CO2 22 - 32 mmol/L 17(L) 21(L) 25  Calcium 8.9 - 10.3 mg/dL 7.5(L) 8.3(L) 8.7(L)  Total Protein 6.5 - 8.1 g/dL 6.0(L) 6.7 7.3  Total Bilirubin 0.3 - 1.2 mg/dL 6.2(H) 6.8(H) 5.0(HH)  Alkaline Phos 38 - 126 U/L 747(H) 894(H) 1,292(H)  AST 15 - 41 U/L 551(H) 98(H) 120(H)  ALT 0 - 44 U/L 184(H) 72(H) 81(H)    Lab Results  Component Value Date   WBC 10.1 10/12/2019   HGB 7.2 (L) 10/12/2019   HCT  22.3 (L) 10/12/2019   MCV 89.2 10/12/2019   PLT 181 10/12/2019   NEUTROABS 2.8 10/11/2019    CT Angio Chest PE W and/or Wo Contrast  Result Date: 10/11/2019 CLINICAL DATA:  40m omni350 Pt had whipple procedure done in sept 2020. Pt was to start chemo today. Bilirubin level is going up. Denies any chest pain or chest pressure. Pt tates has a rapid heart rate. eval for pE EXAM: CT ANGIOGRAPHY CHEST WITH CONTRAST TECHNIQUE: Multidetector CT imaging of the chest was performed using the standard protocol during bolus administration of  intravenous contrast. Multiplanar CT image reconstructions and MIPs were obtained to evaluate the vascular anatomy. CONTRAST:  760mOMNIPAQUE IOHEXOL 350 MG/ML SOLN COMPARISON:  CT abdomen pelvis 10/10/2019 FINDINGS: Cardiovascular: No filling defects within the pulmonary arteries to suggest acute pulmonary embolism. No acute findings of the aorta or great vessels. No pericardial fluid. Mediastinum/Nodes: No axillary supraclavicular adenopathy. No mediastinal hilar adenopathy. No pericardial effusion. Port in the anterior chest wall with tip in distal SVC. Lungs/Pleura: No suspicious pulmonary nodules. No infiltrate Upper Abdomen: Innumerable hepatic metastasis again noted. Musculoskeletal: No aggressive osseous lesion Review of the MIP images confirms the above findings. IMPRESSION: 1. No evidence acute pulmonary embolism. 2. No acute pulmonary parenchymal findings. 3. Innumerable hepatic metastasis. Electronically Signed   By: StSuzy Bouchard.D.   On: 10/11/2019 09:50   CT Abdomen Pelvis W Contrast  Result Date: 10/10/2019 CLINICAL DATA:  [age]ear old male with history of pancreatic cancer diagnosed in February 2020. Elevated bilirubin. Evaluate for potential biliary obstruction. EXAM: CT ABDOMEN AND PELVIS WITH CONTRAST TECHNIQUE: Multidetector CT imaging of the abdomen and pelvis was performed using the standard protocol following bolus administration of intravenous contrast. CONTRAST:  10062mMNIPAQUE IOHEXOL 300 MG/ML  SOLN COMPARISON:  CT the abdomen and pelvis 08/24/2019. Abdominal MRI 09/05/2019. FINDINGS: Lower chest: Unremarkable. Hepatobiliary: Marked increase in number and size of what are now innumerable low-intermediate attenuation lesions scattered throughout the hepatic parenchyma, largest of which measure up to 3.6 x 2.4 cm in segment 4B of the liver (axial image 37 of series 3). Some of these lesions appear elongated within the hepatic parenchyma best appreciated on sagittal image 53 of  series 7, where that length of the lesion previously described measures up to 11.6 cm. These lesions are exerting local mass effect upon adjacent structures causing compression of central bile ducts resulting in mild to moderate intrahepatic biliary ductal dilatation throughout the right lobe of the liver. Status post cholecystectomy is part of Whipple procedure. In the gallbladder fossa there is a large amount of intermediate attenuation fluid and/or soft tissue which has a thick rim of peripheral enhancement estimated to measure approximately 6.1 x 6.6 x 3.5 cm (axial image 43 of series 3 and coronal image 33 of series 6). This soft tissue appears intimately associated both with the undersurface of the liver, as well as several adjacent small bowel loops, in close proximity to the presumed location of the hepaticojejunostomy. Previously noted fluid collection beneath the right lobe of the liver (which previously had a percutaneous drainage catheter on prior CT 08/24/2019) is again noted on today's examination, currently measuring 5.7 x 5.3 x 6.9 cm (axial image 51 of series 3 and coronal image 34 of series 6) with thick peripheral rim of soft tissue and enhancement which appears to partially extend into the right oblique musculature best appreciated on axial image 47 of series 3 and coronal image 33 of series 6). Pancreas: Postoperative changes of  Whipple procedure. Spleen: Unremarkable. Adrenals/Urinary Tract: 1.5 cm low-attenuation lesion in the upper pole of the right kidney, compatible with a simple cyst. Left kidney and bilateral adrenal glands are normal in appearance. No hydroureteronephrosis. Urinary bladder is normal in appearance. Stomach/Bowel: Postoperative changes of Whipple procedure. Loops of small bowel and colon traversing beneath the right lobe of the liver appear thickened as they come in close association with the previously noted fluid and/or soft tissue collection in this region. No  pathologic dilatation of small bowel or colon. Normal appendix. Vascular/Lymphatic: Aortic atherosclerosis, without evidence of aneurysm or dissection in the abdominal or pelvic vasculature. Small of filling defect in the proximal portal vein (axial image 36 of series 3), concerning for nonobstructive thrombus. Enlarged lymph node immediately anterior to the superior mesenteric vein (axial image 43 of series 3) measuring 1.5 cm in short axis. Reproductive: Prostate gland and seminal vesicles are unremarkable in appearance. Other: Trace volume of perihepatic ascites.  No pneumoperitoneum. Musculoskeletal: There are no aggressive appearing lytic or blastic lesions noted in the visualized portions of the skeleton. IMPRESSION: 1. Marked increased number and size of innumerable hepatic lesions, as well as persistence of right infra hepatic postoperative fluid collection and development of an additional fluid and/or soft tissue collection beneath the gallbladder fossa. Overall, the findings are favored to reflect progressive metastatic disease to the liver, however, there is overlap with potential infectious etiologies (multiple intrahepatic abscesses and subhepatic abscesses). Guided sampling of one or more of these lesions is suggested to establish a definitive diagnosis. 2. 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 3. Interval development of mildly enlarged mesenteric lymph node measuring 1.5 cm in short axis immediately anterior to the portal vein, suspicious for a metastatic lymph node, although reactive lymphadenopathy in the setting of extensive infection is not excluded. 4. Additional incidental findings, as above. Electronically Signed   By: Vinnie Langton M.D.   On: 10/10/2019 17:34   DG Chest Port 1 View  Result Date: 10/11/2019 CLINICAL DATA:  Chills. Additional history provided: Syncopal episode, history of liver CA. EXAM: PORTABLE CHEST 1 VIEW COMPARISON:  CT angiogram chest  08/07/2019, chest radiograph 08/07/2011 FINDINGS: Left chest infusion port catheter with tip projecting in the region of the superior cavoatrial junction. Heart size within normal limits. No airspace consolidation within the lungs. No evidence of pleural effusion or pneumothorax. No acute bony abnormality. IMPRESSION: No evidence of acute cardiopulmonary abnormality. Electronically Signed   By: Kellie Simmering DO   On: 10/11/2019 07:57   [place] CORE BIOPSY (LIVER)  Result Date: 09/26/2019 INDICATION: History of pancreatic carcinoma with development multiple small liver lesions. EXAM: ULTRASOUND GUIDED CORE BIOPSY OF LIVER MEDICATIONS: None. ANESTHESIA/SEDATION: Fentanyl 100 mcg IV; Versed 4.0 mg IV Moderate Sedation Time:  20 minutes. The patient was continuously monitored during the procedure by the interventional radiology nurse under my direct supervision. PROCEDURE: The procedure, risks, benefits, and alternatives were explained to the patient. Questions regarding the procedure were encouraged and answered. The patient understands and consents to the procedure. A time-out was performed prior to initiating the procedure. Ultrasound was performed of the liver. The abdominal wall was prepped with chlorhexidine in a sterile fashion, and a sterile drape was applied covering the operative field. A sterile gown and sterile gloves were used for the procedure. Local anesthesia was provided with 1% Lidocaine. After choosing lesions for sampling, a 17 gauge trocar needle was advanced into the liver. After confirming needle tip position, coaxial 18 gauge core biopsy  samples were obtained. Three intact core biopsy samples were submitted in formalin. Gel-Foam pledgets were advanced through the outer needle as it was retracted. Additional ultrasound was performed. COMPLICATIONS: None immediate. FINDINGS: Ultrasound demonstrates numerous solid hypoechoic rounded lesions throughout the liver parenchyma. The largest measures  approximately 1.9 cm within the inferior right lobe near the juncture with the left lobe. Lesions have likely increased in size and number since the MRI study on 09/05/2019 and are suspicious for metastatic disease by imaging. Solid tissue was obtained with biopsy. IMPRESSION: Ultrasound-guided core biopsy performed adjacent hypoechoic solid lesions within the inferior right lobe of the liver. The largest measures approximately 1.9 cm and lesions have likely progressed since the MRI study on 09/05/2019. Electronically Signed   By: Aletta Edouard M.D.   On: 09/26/2019 14:49    ASSESSMENT AND PLAN: 1. Pancreas head mass  Presented with jaundice, bilirubin elevated at 21.4  CTs abdomen/pelvis 12/20/2018-severe intrahepatic and extrahepatic biliary dilatation due to a possible 3.5 x 2.5 cm solid mass in the pancreatic head. Pancreatic ductal dilatation noted as well with multiple cystic lesions in the pancreatic tail and body.   CT chest 12/20/2018-occasional nonspecific small pulmonary nodules felt to likely be related to prior infection or inflammation.   ERCP by Dr. Watt Climes 12/21/2018. The major papilla was on the rim of a diverticulum. The minor papilla appeared to be bulging. A biopsy of the duodenum adjacent to the minor papilla was performed. Plastic stent was placed into the ventral pancreatic duct. Biliary sphincterotomy performed. Covered metal stent placed into the common bile duct. Bile duct brushings showed benign reactive/reparative changes. Biopsy of polypoid duodenal mucosa suggestive of nodular peptic duodenitis, negative for dysplasia or malignancy.   Upper EUS by Dr. Paulita Fujita on 01/03/2019 showed a few cystic lesions in the pancreatic body and pancreatic tail. A stent was visualized in the common bile duct. Mass identified in the pancreatic head with fine-needle aspiration performed. Lesion appeared to abut or potentially superficially invade the superior mesenteric vein. No  lymphadenopathy. No involvement of the superior mesenteric artery or celiac artery. Pathology returned suspicious for malignancy. Dr. Paulita Fujita notes if pathology shows adenocarcinoma the lesion would be staged T3 N0 MX.  Repeat ERCP and placement of a metal bile duct stent 01/24/2019  EUS 01/24/2019, T3N0 pancreas head mass with abutment of the SMV, FNA of the pancreas mass revealed adenocarcinoma, cytology from the bile duct stent revealed adenocarcinoma  Cycle 1 FOLFIRINOX4/04/2019  Cycle 2 FOLFIRINOX4/20/2020, Udenyca added  Cycle 3 FOLFIRINOX 03/09/2019, Irinotecan and 5-fluorouracil dose reduced, Udenyca held  Cycle 4 FOLFIRINOX 03/21/2019-Udenyca held  Cycle 5 FOLFIRINOX6/01/2019  Cycle 6 FOLFIRINOX 04/24/2019 (oxaliplatin dose reduced due to poor tolerance of chemotherapy)  CT 05/11/2019-decreased size of pancreas head mass, unchanged peripancreatic lymph node, no evidence of metastatic disease, improved/resolved fluid collections at the pancreas tail  Cycle 7 FOLFIRINOX 05/22/2019  Cycle 8 FOLFIRINOX 06/06/2019  07/20/2019 pancreaticoduodenectomy 07/20/2019-pT2, pN0 (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  Treatment effect absent, extensive residual cancer with no evident tumor regression)  MRI abdomen 09/05/2019-reaccumulation of fluid along the perihepatic margin following presumed removal of the pigtail drainage catheter that was present in this area on the study of 08/24/2019; some fluid in the region of the porta hepatis and pancreaticobiliary limb; small collection of fluid just inferior to the interlobar fissure in  the liver along the course of the previous drain; tract extending from this area to the midline abdominal wound; innumerable areas of  abnormality seen on T2 and diffusion throughout the liver, largest in the range of 1 cm.  All areas of the liver affected, both right and left hepatic lobes.  Along the gallbladder fossa is a peripherally enhancing 1.8 x 1.3 cm focus.  Ultrasound-guided biopsy of a liver lesion 09/26/2019-adenocarcinoma compatible with pancreatic adenocarcinoma 2. Biliary obstruction status post stent placement 3. History of left lower extremity DVT 2017, 2018-on Eliquis. 4. Type 2 diabetes 5. Hypertension 6. Chronic kidney disease 7. Tobacco use 8. Anemia, macrocytic. Transfused 2 units of blood 12/20/2018.On oral iron. Improved 02/01/2019 and 02/06/2019. 9. Port-A-Cath placement 02/02/2019, Dr. Barry Dienes 10. 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 Hospitalized 08/07/2019 through 08/11/2019 with postsurgical intra-abdominal fluid collection.  CT 08/07/2019 with 2 rim-enhancing fluid collections in the right upper quadrant concerning for abscesses, moderate wall thickening and pericolonic inflammatory changes involving the distal ascending, transverse and descending colon consistent with colitis.  Blood culture 08/07/2019 + for Prevotella buccae beta-lactamase positive; status post CT-guided aspiration of intra-abdominal fluid collection and drain placement on 08/09/2019, culture with multiple organisms present, none predominant; follow-up CT 08/24/2019 with resolution of dominant fluid collection within the right upper abdominal quadrant, tiny serpiginous fluid collection within the ventral aspect of the right upper abdomen, interval development of multiple ill-defined hypoattenuating liver lesions; MRI abdomen 09/05/2019 reaccumulation of fluid along the perihepatic margin following presumed removal of the pigtail drainage catheter that was present in this area on the study of 08/24/2019; some fluid in the region of the porta hepatis and  pancreaticobiliary limb; small collection of fluid just inferior to the interlobar fissure in the liver along the course of the previous drain; tract extending from this area to the midline abdominal wound; innumerable areas of abnormality seen on T2 and diffusion throughout the liver, largest in the range of 1 cm.  All areas of the liver affected, both right and left hepatic lobes.  Along the gallbladder fossa is a peripherally enhancing 1.8 x 1.3 cm focus. [facility] admission 10/11/2019-sepsis, blood cultures positive for a Streptococcus species  Brian Torres is admitted with fever and sepsis.  Infection likely due to right upper abdominal quadrant abscess.  He has positive blood cultures.  He is on IV cefepime, vancomycin, and metronidazole. Status post placement of right upper quadrant drain by interventional radiology earlier this morning.  His total bili remains elevated due to sepsis, liver metastases, and possible bile duct obstruction.  The patient has anemia likely due to his underlying malignancy and sepsis.  Recommendations: 1.  Continue IV antibiotics.  Await organism ID and sensitivities. 2.  We will follow-up on the cultures from his right upper quadrant abscess. 3.  Monitor CBC and liver function closely.  Transfuse PRBCs for hemoglobin less than 7. 4.  Plans for salvage chemotherapy will depend his recovery from the sepsis and improvement in the liver enzymes.   LOS: 1 day   Mikey Bussing, DNP, AGPCNP-BC, AOCNP 10/12/19 Brian Torres was interviewed and examined.  He appears unchanged.  He remains on broad-spectrum intravenous antibiotic support.  Blood cultures have returned positive for a Streptococcus species.  The likely source for infection is the abdominal abscess.  The abscess was aspirated earlier today.  We will consider salvage chemotherapy for the pancreas cancer if the liver enzymes improve treatment of the  infection.

## 2019-10-12 NOTE — ED Notes (Signed)
Oncology at bedside

## 2019-10-13 ENCOUNTER — Inpatient Hospital Stay (HOSPITAL_COMMUNITY): Payer: Managed Care, Other (non HMO)

## 2019-10-13 ENCOUNTER — Encounter (HOSPITAL_COMMUNITY): Payer: Self-pay | Admitting: Internal Medicine

## 2019-10-13 DIAGNOSIS — N179 Acute kidney failure, unspecified: Secondary | ICD-10-CM

## 2019-10-13 HISTORY — PX: IR INT EXT BILIARY DRAIN WITH CHOLANGIOGRAM: IMG6044

## 2019-10-13 LAB — COMPREHENSIVE METABOLIC PANEL
ALT: 146 U/L — ABNORMAL HIGH (ref 0–44)
AST: 225 U/L — ABNORMAL HIGH (ref 15–41)
Albumin: 1.5 g/dL — ABNORMAL LOW (ref 3.5–5.0)
Alkaline Phosphatase: 660 U/L — ABNORMAL HIGH (ref 38–126)
Anion gap: 9 (ref 5–15)
BUN: 19 mg/dL (ref 6–20)
CO2: 19 mmol/L — ABNORMAL LOW (ref 22–32)
Calcium: 7.6 mg/dL — ABNORMAL LOW (ref 8.9–10.3)
Chloride: 105 mmol/L (ref 98–111)
Creatinine, Ser: 0.94 mg/dL (ref 0.61–1.24)
GFR calc Af Amer: >60 mL/min (ref >60)
GFR calc non Af Amer: >60 mL/min (ref >60)
Glucose, Bld: 173 mg/dL — ABNORMAL HIGH (ref 70–99)
Potassium: 3.9 mmol/L (ref 3.5–5.1)
Sodium: 133 mmol/L — ABNORMAL LOW (ref 135–145)
Total Bilirubin: 6.4 mg/dL — ABNORMAL HIGH (ref 0.3–1.2)
Total Protein: 5.5 g/dL — ABNORMAL LOW (ref 6.5–8.1)

## 2019-10-13 LAB — GLUCOSE, CAPILLARY
Glucose-Capillary: 165 mg/dL — ABNORMAL HIGH (ref 70–99)
Glucose-Capillary: 146 mg/dL — ABNORMAL HIGH (ref 70–99)
Glucose-Capillary: 133 mg/dL — ABNORMAL HIGH (ref 70–99)
Glucose-Capillary: 116 mg/dL — ABNORMAL HIGH (ref 70–99)
Glucose-Capillary: 165 mg/dL — ABNORMAL HIGH (ref 70–99)

## 2019-10-13 LAB — CBC WITH DIFFERENTIAL/PLATELET
Abs Immature Granulocytes: 0.11 10*3/uL — ABNORMAL HIGH (ref 0.00–0.07)
Basophils Absolute: 0 10*3/uL (ref 0.0–0.1)
Basophils Relative: 0 %
Eosinophils Absolute: 0.2 10*3/uL (ref 0.0–0.5)
Eosinophils Relative: 2 %
HCT: 20.4 % — ABNORMAL LOW (ref 39.0–52.0)
Hemoglobin: 6.6 g/dL — CL (ref 13.0–17.0)
Immature Granulocytes: 1 %
Lymphocytes Relative: 10 %
Lymphs Abs: 1 10*3/uL (ref 0.7–4.0)
MCH: 28.7 pg (ref 26.0–34.0)
MCHC: 32.4 g/dL (ref 30.0–36.0)
MCV: 88.7 fL (ref 80.0–100.0)
Monocytes Absolute: 0.9 10*3/uL (ref 0.1–1.0)
Monocytes Relative: 8 %
Neutro Abs: 8.6 10*3/uL — ABNORMAL HIGH (ref 1.7–7.7)
Neutrophils Relative %: 79 %
Platelets: 196 10*3/uL (ref 150–400)
RBC: 2.3 MIL/uL — ABNORMAL LOW (ref 4.22–5.81)
RDW: 18.9 % — ABNORMAL HIGH (ref 11.5–15.5)
WBC: 10.8 10*3/uL — ABNORMAL HIGH (ref 4.0–10.5)
nRBC: 0 % (ref 0.0–0.2)

## 2019-10-13 LAB — PROTIME-INR
INR: 2.3 — ABNORMAL HIGH (ref 0.8–1.2)
Prothrombin Time: 25.3 seconds — ABNORMAL HIGH (ref 11.4–15.2)

## 2019-10-13 LAB — HEMOGLOBIN AND HEMATOCRIT, BLOOD
HCT: 22.9 % — ABNORMAL LOW (ref 39.0–52.0)
Hemoglobin: 7.5 g/dL — ABNORMAL LOW (ref 13.0–17.0)

## 2019-10-13 LAB — PREPARE RBC (CROSSMATCH)

## 2019-10-13 MED ORDER — MIDAZOLAM HCL 2 MG/2ML IJ SOLN
INTRAMUSCULAR | Status: AC
  Filled 2019-10-13: qty 2

## 2019-10-13 MED ORDER — SODIUM CHLORIDE 0.9% IV SOLUTION: Freq: Once | INTRAVENOUS | Status: DC

## 2019-10-13 MED ORDER — MIDAZOLAM HCL 2 MG/2ML IJ SOLN
INTRAMUSCULAR | Status: AC | PRN
  Administered 2019-10-13 (×2): 1 mg via INTRAVENOUS

## 2019-10-13 MED ORDER — IOHEXOL 300 MG/ML  SOLN
50.0000 mL | Freq: Once | INTRAMUSCULAR | Status: AC | PRN
  Administered 2019-10-13: 20 mL

## 2019-10-13 MED ORDER — SODIUM CHLORIDE 0.9% FLUSH
10.0000 mL | Freq: Two times a day (BID) | INTRAVENOUS | Status: DC
  Administered 2019-10-13 – 2019-10-16 (×3): 10 mL

## 2019-10-13 MED ORDER — FENTANYL CITRATE (PF) 100 MCG/2ML IJ SOLN
INTRAMUSCULAR | Status: AC
  Filled 2019-10-13: qty 2

## 2019-10-13 MED ORDER — SODIUM CHLORIDE (PF) 0.9 % IJ SOLN
INTRAMUSCULAR | Status: AC
  Filled 2019-10-13: qty 50

## 2019-10-13 MED ORDER — INSULIN ASPART 100 UNIT/ML ~~LOC~~ SOLN
0.0000 [IU] | Freq: Three times a day (TID) | SUBCUTANEOUS | Status: DC
  Administered 2019-10-13: 1 [IU] via SUBCUTANEOUS
  Administered 2019-10-14 (×2): 7 [IU] via SUBCUTANEOUS
  Administered 2019-10-14 – 2019-10-15 (×2): 2 [IU] via SUBCUTANEOUS

## 2019-10-13 MED ORDER — FENTANYL CITRATE (PF) 100 MCG/2ML IJ SOLN
INTRAMUSCULAR | Status: AC | PRN
  Administered 2019-10-13 (×2): 50 ug via INTRAVENOUS

## 2019-10-13 MED ORDER — LIDOCAINE HCL 1 % IJ SOLN
INTRAMUSCULAR | Status: AC
  Filled 2019-10-13: qty 20

## 2019-10-13 MED ORDER — IOHEXOL 300 MG/ML  SOLN
100.0000 mL | Freq: Once | INTRAMUSCULAR | Status: AC | PRN
  Administered 2019-10-13: 100 mL via INTRAVENOUS

## 2019-10-13 MED ORDER — INSULIN ASPART 100 UNIT/ML ~~LOC~~ SOLN
0.0000 [IU] | Freq: Every day | SUBCUTANEOUS | Status: DC
  Administered 2019-10-14: 4 [IU] via SUBCUTANEOUS

## 2019-10-13 NOTE — Progress Notes (Signed)
Taking over care of patient agree with previous RN assessment. Denies any needs at this time. Will continue to monitor.  

## 2019-10-13 NOTE — Progress Notes (Signed)
PT Cancellation Note  Patient Details Name: Brian Torres MRN: [id number] DOB: [birth year]   Cancelled Treatment:    Reason Eval/Treat Not Completed: Medical issues which prohibited therapy spoke with OT who reports, "Pt getting blood transfusion and going to CT afterwards per RN. RN requested therapy hold for today"     Quyen Cutsforth,KATHrine E 10/13/2019, 11:32 AM Arlyce Dice, DPT Acute [facility] Services Office: [phone]

## 2019-10-13 NOTE — Progress Notes (Signed)
Patient is currently off the floor for his procedure.  Blood cultures and liver abscess with Strep Group F.   On ceftriaxone. Can narrow to penicillin if sensitive once those come out.  I would keep him on IV therapy while inpatient CT of abdomen today shows resolution of the fluid collection/abscess.  Transition to oral Augmentin at discharge for another 7 days to assure complete resolution with bacteremia.   I will otherwise sign off, thanks for the consultation. Thayer Headings, MD

## 2019-10-13 NOTE — Procedures (Signed)
  Procedure: Cholangiogram, Int/external biliary drain placement 24F EBL:   minimal Complications:  none immediate  See full dictation in [facility].  Dillard Cannon MD Main # [phone] Pager  [phone]

## 2019-10-13 NOTE — Progress Notes (Signed)
OT Cancellation Note  Patient Details Name: Brian Torres MRN: [id number] DOB: [birth year]   Cancelled Treatment:    Reason Eval/Treat Not Completed: Medical issues which prohibited therapy;Other (comment). Pt getting blood transfusion and going to CT afterwards per RN. RN requested therapy hold for today  [name] 10/13/2019, 10:45 AM

## 2019-10-13 NOTE — Progress Notes (Addendum)
PROGRESS NOTE    EUSTACIO LUTH  [id number] DOB: November 26, 1960 DOA: 10/11/2019 PCP: Cari Caraway, MD    Brief Narrative:  Patient is a 58-year male with history of metastatic pancreatic cancer, diabetes type 2, hyperlipidemia, hypertension, depression, history of DVT on Eliquis who presented from home with complaints of fever, chills.  He has history of metastatic pancreatic cancer with liver mets and underwent Whipple's procedure recently. He was also admitted on October and was found to have intraabdominal  abscesses . On this admission, it was reported that he was gradually declining over the past 2 weeks.  Developed fever, chills, diarrhea, abdominal discomfort.  He was hypotensive on arrival.  CT abdomen/pelvis done in the emergency department showed numerous liver metastasis, possible abscesses.  Started on broad-spectrum antibiotics.  IR consulted and he underwent IR guided drainage placement. Now blood cultures growing streptococcal species.ID consulted, suggest He will need a prolonged course of antibiotics but oral therapy with drainage will likely be adequate, depending on the organism sensitivities.     Assessment & Plan:   Active Problems:   Sepsis ([place])   AKI (acute kidney injury) ([place])  # Sepsis/gram-positive bacteremia: Presented with fever, tachycardia, tachypnea, elevated lactic acid level. Currently hemodynamically stable.  Sepsis secondary to liver abscess.  Blood cultures showing Streptococcus species.  Antibiotics narrowed to Zosyn.  Will follow final culture and sensitivity.  ID consulted  Suggest  He will need a prolonged course of antibiotics but oral therapy with drainage will likely be adequate, depending on the organism sensitivities.    Liver abscess: Underwent CT-guided right upper quadrant drain placement for abscess on 10/12/19.  CT abdomen showed numerous liver mets and abscesses.  Continue current antibiotics.  He was hospitalized from 08/07/2019 through  08/11/2019 with postsurgical intra-abdominal fluid collection.  CT at that time showed 2 rim-enhancing fluid collection in the right upper quadrant concerning for abscess, colitis.  Blood cultures had shown Prevotella.  Underwent CT-guided aspiration of intra-abdominal fluid collection and placement of drain  on 08/09/2019.  He was discharged on Augmentin at that time.  IR will remove the drain.  Metastatic pancreatic adenocarcinoma: Follows with Dr. Learta Codding.  I stage IV cancer.Marland Kitchen  Has worsening LFTs.  Has metastatic liver disease.  As per oncology, there was plan to begin salvage chemotherapy with gemcitabine/Abraxane on 10/09/2019.  Chemotherapy held due to new hyperbilirubinemia. History of Whipple's procedure on 07/10/19.  Has abdominal wound that is healing. Monitor CMP  AKI: Improved with IV fluids.  Anemia could be chronic sec.to pancreatic cancer: will transfuse 2 PRBC, f/u post transfusion H/H. No obvious source/cause without evidence, hemodynamic instability.  Diabetes mellitus 2: Continue current insulin regimen.  On antihyperglycemics at home.  Hypertension: Hypotensive on arrival.  Antihypertensives held  History of DVT: On Eliquis.  Keep on hold given Anemia   DVT prophylaxis:  SCDS, Eliquis on hold.  Code Status: Full Family Communication:  D/W patient Disposition Plan: Home after full workup Consultants:    ID  Oncology   Procedures:  Right upper quadrant drain.  Antimicrobials:  Anti-infectives (From admission, onward)   Start     Dose/Rate Route Frequency Ordered Stop   10/12/19 2000  cefTRIAXone (ROCEPHIN) 2 g in sodium chloride 0.9 % 100 mL IVPB     2 g 200 mL/hr over 30 Minutes Intravenous Every 24 hours 10/12/19 1554     10/12/19 1800  piperacillin-tazobactam (ZOSYN) IVPB 3.375 g  Status:  Discontinued     3.375 g 12.5 mL/hr over  240 Minutes Intravenous Every 8 hours 10/12/19 1348 10/12/19 1554   10/11/19 2000  vancomycin (VANCOCIN) IVPB 1000 mg/200 mL  premix  Status:  Discontinued     1,000 mg 200 mL/hr over 60 Minutes Intravenous Every 12 hours 10/11/19 1242 10/12/19 1348   10/11/19 1600  ceFEPIme (MAXIPIME) 2 g in sodium chloride 0.9 % 100 mL IVPB  Status:  Discontinued     2 g 200 mL/hr over 30 Minutes Intravenous Every 8 hours 10/11/19 1242 10/12/19 1348   10/11/19 1400  metroNIDAZOLE (FLAGYL) IVPB 500 mg  Status:  Discontinued     500 mg 100 mL/hr over 60 Minutes Intravenous Every 8 hours 10/11/19 1206 10/12/19 1348   10/11/19 0730  vancomycin (VANCOCIN) 1,500 mg in sodium chloride 0.9 % 500 mL IVPB     1,500 mg 250 mL/hr over 120 Minutes Intravenous  Once 10/11/19 0716 10/11/19 1032   10/11/19 0715  ceFEPIme (MAXIPIME) 2 g in sodium chloride 0.9 % 100 mL IVPB     2 g 200 mL/hr over 30 Minutes Intravenous  Once 10/11/19 0714 10/11/19 0758   10/11/19 0715  metroNIDAZOLE (FLAGYL) IVPB 500 mg     500 mg 100 mL/hr over 60 Minutes Intravenous  Once 10/11/19 0714 10/11/19 0912   10/11/19 0715  vancomycin (VANCOCIN) IVPB 1000 mg/200 mL premix  Status:  Discontinued     1,000 mg 200 mL/hr over 60 Minutes Intravenous  Once 10/11/19 [id number] 10/11/19 0716      Subjective: Patient was seen and examined at bedside, He is hemodynamically stable,  denies any abdominal pain.  He has a drain on his right quadrant which is draining pinkish colored fluid without pus.  Objective: Vitals:   10/13/19 0842 10/13/19 0910 10/13/19 1148 10/13/19 1232  BP: 127/71 122/75 120/67 121/75  Pulse: 99 93 89 92  Resp: 14 14  16   Temp: 97.6 F (36.4 C) 99 F (37.2 C) 98.6 F (37 C) 98.4 F (36.9 C)  TempSrc: Oral Oral Oral Oral  SpO2: 99% 99% 99% 100%  Weight:      Height:        Intake/Output Summary (Last 24 hours) at 10/13/2019 1332 Last data filed at 10/13/2019 1148 Gross per 24 hour  Intake 1025 ml  Output 625 ml  Net 400 ml   Filed Weights   10/12/19 1506  Weight: 87.6 kg    Examination:  General exam: Appears calm and comfortable,  NAD Respiratory system: Clear to auscultation. Respiratory effort normal. Cardiovascular system: S1 & S2 heard, RRR. No JVD, murmurs, rubs, gallops or clicks. No pedal edema. Gastrointestinal system: Abdomen is nondistended, soft and nontender. No organomegaly or masses felt. Normal bowel sounds heard. There is drain with pinkish drainage. Central nervous system: Alert and oriented. No focal neurological deficits. Extremities:  No pedal edema, no swelling. Skin: No rashes, lesions or ulcers Psychiatry: Judgement and insight appear normal. Mood & affect appropriate.     Data Reviewed: I have personally reviewed following labs and imaging studies  CBC: Recent Labs  Lab 10/09/19 1024 10/11/19 0710 10/12/19 0445 10/13/19 0400  WBC 13.0* 3.1* 10.1 10.8*  NEUTROABS 10.5* 2.8  --  8.6*  HGB 8.8* 12.9* 7.2* 6.6*  HCT 26.6* 39.8 22.3* 20.4*  MCV 87.2 87.3 89.2 88.7  PLT 268 169 181 [id number]   Basic Metabolic Panel: Recent Labs  Lab 10/09/19 1024 10/11/19 0710 10/12/19 0445 10/13/19 0400  NA 131* 130* 133* 133*  K 5.1 4.1 4.1 3.9  CL  96* 96* 105 105  CO2 25 21* 17* 19*  GLUCOSE 245* 170* 106* 173*  BUN 21* 32* 33* 19  CREATININE 1.09 1.30* 1.15 0.94  CALCIUM 8.7* 8.3* 7.5* 7.6*   GFR: Estimated Creatinine Clearance: 103.9 mL/min (by C-G formula based on SCr of 0.94 mg/dL). Liver Function Tests: Recent Labs  Lab 10/09/19 1024 10/11/19 0710 10/12/19 0445 10/13/19 0400  AST 120* 98* 551* 225*  ALT 81* 72* 184* 146*  ALKPHOS 1,292* 894* 747* 660*  BILITOT 5.0* 6.8* 6.2* 6.4*  PROT 7.3 6.7 6.0* 5.5*  ALBUMIN 2.2* 2.1* 1.7* 1.5*   Recent Labs  Lab 10/11/19 0710  LIPASE 14   Recent Labs  Lab 10/11/19 0721  AMMONIA 40*   Coagulation Profile: Recent Labs  Lab 10/11/19 0710 10/12/19 0445 10/13/19 0831  INR 1.9* 2.1* 2.3*   Cardiac Enzymes: No results for input(s): CKTOTAL, CKMB, CKMBINDEX, TROPONINI in the last 168 hours. BNP (last 3 results) No results for  input(s): PROBNP in the last 8760 hours. HbA1C: Recent Labs    10/11/19 1700  HGBA1C 8.4*   CBG: Recent Labs  Lab 10/12/19 1909 10/12/19 2334 10/13/19 0416 10/13/19 0740 10/13/19 1147  GLUCAP 278* 236* 165* 146* 133*   Lipid Profile: No results for input(s): CHOL, HDL, LDLCALC, TRIG, CHOLHDL, LDLDIRECT in the last 72 hours. Thyroid Function Tests: No results for input(s): TSH, T4TOTAL, FREET4, T3FREE, THYROIDAB in the last 72 hours. Anemia Panel: No results for input(s): VITAMINB12, FOLATE, FERRITIN, TIBC, IRON, RETICCTPCT in the last 72 hours. Sepsis Labs: Recent Labs  Lab 10/11/19 0710 10/11/19 0910  LATICACIDVEN 5.2* 3.1*    Recent Results (from the past 240 hour(s))  Blood Culture (routine x 2)     Status: Abnormal (Preliminary result)   Collection Time: 10/11/19  7:10 AM   Specimen: BLOOD  Result Value Ref Range Status   Specimen Description   Final    BLOOD PORTA CATH Performed at [facility] [address], [place], [place] [address]    Special Requests   Final    BOTTLES DRAWN AEROBIC AND ANAEROBIC Blood Culture adequate volume Performed at [facility] [address], [place], [place] [address]    Culture  Setup Time   Final    GRAM POSITIVE COCCI IN CHAINS CRITICAL RESULT CALLED TO, READ BACK BY AND VERIFIED WITH: B GREEN PHARMD 10/12/19 0629 JDW IN BOTH AEROBIC AND ANAEROBIC BOTTLES    Culture (A)  Final    STREPTOCOCCUS GROUP F SUSCEPTIBILITIES TO FOLLOW Performed at [facility], [place] [address] [place], [place] [address]    Report Status PENDING  Incomplete  Respiratory Panel by RT PCR (Flu A&B, Covid) - Nasopharyngeal Swab     Status: None   Collection Time: 10/11/19  7:11 AM   Specimen: Nasopharyngeal Swab  Result Value Ref Range Status   SARS Coronavirus 2 by RT PCR NEGATIVE NEGATIVE Final    Comment: (NOTE) SARS-CoV-2 target nucleic acids are NOT DETECTED. The SARS-CoV-2 RNA is generally  detectable in upper respiratoy specimens during the acute phase of infection. The lowest concentration of SARS-CoV-2 viral copies this assay can detect is 131 copies/mL. A negative result does not preclude SARS-Cov-2 infection and should not be used as the sole basis for treatment or other patient management decisions. A negative result may occur with  improper specimen collection/handling, submission of specimen other than nasopharyngeal swab, presence of viral mutation(s) within the areas targeted by this assay, and inadequate  number of viral copies (<131 copies/mL). A negative result must be combined with clinical observations, patient history, and epidemiological information. The expected result is Negative. Fact Sheet for Patients:  PinkCheek.be Fact Sheet for Healthcare Providers:  GravelBags.it This test is not yet ap proved or cleared by the [place] FDA and  has been authorized for detection and/or diagnosis of SARS-CoV-2 by FDA under an Emergency Use Authorization (EUA). This EUA will remain  in effect (meaning this test can be used) for the duration of the COVID-19 declaration under Section 564(b)(1) of the Act, 21 U.S.C. section 360bbb-3(b)(1), unless the authorization is terminated or revoked sooner.    Influenza A by PCR NEGATIVE NEGATIVE Final   Influenza B by PCR NEGATIVE NEGATIVE Final    Comment: (NOTE) The Xpert Xpress SARS-CoV-2/FLU/RSV assay is intended as an aid in  the diagnosis of influenza from Nasopharyngeal swab specimens and  should not be used as a sole basis for treatment. Nasal washings and  aspirates are unacceptable for Xpert Xpress SARS-CoV-2/FLU/RSV  testing. Fact Sheet for Patients: PinkCheek.be Fact Sheet for Healthcare Providers: GravelBags.it This test is not yet approved or cleared by the [place] FDA and  has been  authorized for detection and/or diagnosis of SARS-CoV-2 by  FDA under an Emergency Use Authorization (EUA). This EUA will remain  in effect (meaning this test can be used) for the duration of the  Covid-19 declaration under Section 564(b)(1) of the Act, 21  U.S.C. section 360bbb-3(b)(1), unless the authorization is  terminated or revoked. Performed at [facility], [place] [address] [place], [place] [address]   Blood Culture (routine x 2)     Status: Abnormal (Preliminary result)   Collection Time: 10/11/19  7:15 AM   Specimen: BLOOD  Result Value Ref Range Status   Specimen Description   Final    BLOOD LEFT ANTECUBITAL Performed at [facility] [address], [place], [place] [address]    Special Requests   Final    BOTTLES DRAWN AEROBIC AND ANAEROBIC Blood Culture adequate volume Performed at [facility] [address], [place], [place] [address]    Culture  Setup Time   Final    GRAM POSITIVE COCCI IN CHAINS IN BOTH AEROBIC AND ANAEROBIC BOTTLES CRITICAL RESULT CALLED TO, READ BACK BY AND VERIFIED WITH: PHARMD M RENZ 121020 AT 53 AM  BY CM    Culture (A)  Final    STREPTOCOCCUS GROUP F SUSCEPTIBILITIES TO FOLLOW Performed at [facility], [place] [address], [place], [place] [address]    Report Status PENDING  Incomplete  Blood Culture ID Panel (Reflexed)     Status: Abnormal   Collection Time: 10/11/19  7:15 AM  Result Value Ref Range Status   Enterococcus species NOT DETECTED NOT DETECTED Final   Listeria monocytogenes NOT DETECTED NOT DETECTED Final   Staphylococcus species NOT DETECTED NOT DETECTED Final   Staphylococcus aureus (BCID) NOT DETECTED NOT DETECTED Final   Streptococcus species DETECTED (A) NOT DETECTED Final    Comment: Not Enterococcus species, Streptococcus agalactiae, Streptococcus pyogenes, or Streptococcus pneumoniae. CRITICAL RESULT CALLED TO, READ BACK BY AND VERIFIED WITH: PHARMD M RENZ  121020 AT 908 BY CM    Streptococcus agalactiae NOT DETECTED NOT DETECTED Final   Streptococcus pneumoniae NOT DETECTED NOT DETECTED Final   Streptococcus pyogenes NOT DETECTED NOT DETECTED Final   Acinetobacter baumannii NOT DETECTED NOT DETECTED Final   Enterobacteriaceae species NOT DETECTED NOT DETECTED Final  Enterobacter cloacae complex NOT DETECTED NOT DETECTED Final   Escherichia coli NOT DETECTED NOT DETECTED Final   Klebsiella oxytoca NOT DETECTED NOT DETECTED Final   Klebsiella pneumoniae NOT DETECTED NOT DETECTED Final   Proteus species NOT DETECTED NOT DETECTED Final   Serratia marcescens NOT DETECTED NOT DETECTED Final   Haemophilus influenzae NOT DETECTED NOT DETECTED Final   Neisseria meningitidis NOT DETECTED NOT DETECTED Final   Pseudomonas aeruginosa NOT DETECTED NOT DETECTED Final   Candida albicans NOT DETECTED NOT DETECTED Final   Candida glabrata NOT DETECTED NOT DETECTED Final   Candida krusei NOT DETECTED NOT DETECTED Final   Candida parapsilosis NOT DETECTED NOT DETECTED Final   Candida tropicalis NOT DETECTED NOT DETECTED Final    Comment: Performed at [facility], [place] [address] [place], [place] [address]  Urine culture     Status: None   Collection Time: 10/11/19  6:41 PM   Specimen: In/Out Cath Urine  Result Value Ref Range Status   Specimen Description   Final    IN/OUT CATH URINE Performed at [facility], [place] [address], [place], [place] [address]    Special Requests   Final    NONE Performed at [facility], [place] [address] [place], [place] [address]    Culture   Final    NO GROWTH Performed at [facility], [place] [address], [place], [place] [address]    Report Status 10/12/2019 FINAL  Final  Aerobic/Anaerobic Culture (surgical/deep wound)     Status: None (Preliminary result)   Collection Time: 10/12/19  8:58 AM   Specimen: Abscess  Result Value Ref Range Status   Specimen Description    Final    ABSCESS ABDOMEN Performed at [facility] [address] [place], [place] [address]    Special Requests   Final    NONE Performed at [facility], [place] [address], [place], [place] [address]    Gram Stain   Final    ABUNDANT WBC PRESENT, PREDOMINANTLY PMN MODERATE GRAM NEGATIVE COCCOBACILLI    Culture   Final    ABUNDANT STREPTOCOCCUS GROUP F SUSCEPTIBILITIES TO FOLLOW Performed at [facility], [place] [address], [place], [place] [address]    Report Status PENDING  Incomplete  Culture, blood (routine x 2)     Status: None (Preliminary result)   Collection Time: 10/13/19  5:18 AM   Specimen: BLOOD  Result Value Ref Range Status   Specimen Description   Final    BLOOD RIGHT WRIST Performed at [facility] [address], [place], [place] [address]    Special Requests   Final    BOTTLES DRAWN AEROBIC AND ANAEROBIC Blood Culture adequate volume Performed at [facility] [address] [place], [place] [address]    Culture   Final    NO GROWTH < 12 HOURS Performed at [facility] [address], [place], [place] [address]    Report Status PENDING  Incomplete  Culture, blood (routine x 2)     Status: None (Preliminary result)   Collection Time: 10/13/19  5:18 AM   Specimen: BLOOD  Result Value Ref Range Status   Specimen Description   Final    BLOOD RIGHT ARM Performed at [facility] [address], [place],  [address]    Special Requests   Final    BOTTLES DRAWN AEROBIC AND ANAEROBIC Blood Culture adequate  volume Performed at [facility], [place] [address], [place], [place] [address]    Culture   Final    NO GROWTH < 12 HOURS Performed at [facility] [address], [place], [place] [address]    Report Status PENDING  Incomplete         Radiology Studies: CT ABDOMEN PELVIS W CONTRAST  Result Date:  10/13/2019 CLINICAL DATA:  58 year old with history of metastatic pancreatic cancer and status post Whipple. History of intra-abdominal abscess and recent percutaneous drain placement in a perihepatic abscess on 10/12/2019. EXAM: CT ABDOMEN AND PELVIS WITH CONTRAST TECHNIQUE: Multidetector CT imaging of the abdomen and pelvis was performed using the standard protocol following bolus administration of intravenous contrast. CONTRAST:  129mL OMNIPAQUE IOHEXOL 300 MG/ML  SOLN COMPARISON:  CT 10/10/2019 FINDINGS: Lower chest: Again noted is a poorly defined pleural-based density in the right lower lobe on sequence 6, image 14. Dependent densities are most compatible with atelectasis. Interval development of trace bilateral pleural effusions. Hepatobiliary: Again noted are innumerable hypodensities throughout the liver compatible with metastatic disease. Index lesion near the central aspect of the liver measures up to 2.9 cm. Small amount of perihepatic ascites has increased from the recent examination. Postsurgical changes compatible with Whipple procedure and hepatojejunostomy. Poorly defined structures in the central aspect of the liver related to the surgical anastomosis and liver lesions in this area. Evidence for areas of segmental intrahepatic biliary dilatation. Persistent intrahepatic biliary dilatation in the right hepatic lobe on sequence 2, image 29. Limited evaluation of the central biliary structures due to the prior surgery. Pancreas: Post Whipple.  Remaining pancreas is not well visualized. Spleen: Stable appearance of the spleen. Increased perisplenic ascites. Adrenals/Urinary Tract: Normal appearance of both adrenal glands. Negative for hydronephrosis. Evidence for a cortical cyst in the right kidney upper pole. No suspicious renal lesions. Mild distention of the urinary bladder. Stomach/Bowel: Colonic diverticula. Previous examination demonstrated abnormal tissue or soft tissue thickening near the  hepatic flexure of the colon but this area is poorly characterized without oral contrast. No evidence for a bowel obstruction. Evidence for a gastrojejunostomy related to the Whipple procedure. Vascular/Lymphatic: Atherosclerotic calcifications in the abdominal aorta without aneurysm. There are probably abnormal lymph nodes in the central upper abdomen and porta hepatis region but poorly characterized on this examination. There may be narrowing in the main portal vein on sequence 2, image 38 which could be secondary to lymphadenopathy. Main portal vein is poorly characterized on this examination but portal vein is patent. Reproductive: Stable appearance of the prostate. Other: Small amount ascites in the pelvis is new. Slightly increased ascites in the abdomen with some mesenteric edema. The perihepatic fluid collection from the previous examination has resolved with the percutaneous drain. Percutaneous drain is positioned the perihepatic space and similar to the placement images. Prior examination raised concern for tumor or abscess collection near the hepatic flexure of the colon. There is no longer a definite fluid collection adjacent to the hepatic flexure. There may be a small connection between the percutaneous drain and where the previous hepatic flexure collection was located, suggesting this hepatic flexure collection has been decompressed with the percutaneous drain. No new or additional intra-abdominal abscess collections. Musculoskeletal: No acute bone abnormality. IMPRESSION: 1. Perihepatic abscess collection has resolved with the percutaneous drain. In addition, the collection adjacent to the hepatic flexure of the colon has either resolved or decompressed in the interim and probably related to the percutaneous drain. However, the hepatic  flexure area is poorly characterized on this examination. 2. Innumerable liver lesions compatible with metastatic disease. In addition, there are areas of  intrahepatic biliary dilatation likely related to obstruction from liver lesions. Biliary dilatation is likely segmental and scattered throughout the liver. 3. Increased ascites in the abdomen and pelvis. Overall ascites volume is small. Trace pleural fluid has developed. Electronically Signed   By: Markus Daft M.D.   On: 10/13/2019 12:37   CT IMAGE GUIDED DRAINAGE BY PERCUTANEOUS CATHETER  Result Date: 10/12/2019 INDICATION: 58 year old male with a history of right upper quadrant abscess EXAM: CT GUIDED DRAINAGE OF  ABSCESS MEDICATIONS: The patient is currently admitted to the hospital and receiving intravenous antibiotics. The antibiotics were administered within an appropriate time frame prior to the initiation of the procedure. ANESTHESIA/SEDATION: 2.0 mg IV Versed 100 mcg IV Fentanyl Moderate Sedation Time:  12 minutes The patient was continuously monitored during the procedure by the interventional radiology nurse under my direct supervision. COMPLICATIONS: None TECHNIQUE: Informed written consent was obtained from the patient after a thorough discussion of the procedural risks, benefits and alternatives. All questions were addressed. Maximal Sterile Barrier Technique was utilized including caps, mask, sterile gowns, sterile gloves, sterile drape, hand hygiene and skin antiseptic. A timeout was performed prior to the initiation of the procedure. PROCEDURE: The operative field was prepped with Chlorhexidine in a sterile fashion, and a sterile drape was applied covering the operative field. A sterile gown and sterile gloves were used for the procedure. Local anesthesia was provided with 1% Lidocaine. Scout CT was acquired. The patient was prepped and draped in usual sterile fashion. 1% lidocaine was used for local anesthesia. Using CT guidance, guide needle was advanced into the abscess of the right upper quadrant. Modified Seldinger technique was used to place a 10 [place] drain. 30 cc of purulent material  aspirated for a culture. Drain was attached to bulb suction and sutured in position. Patient tolerated the procedure well and remained hemodynamically stable throughout. No complications were encountered and no significant blood loss. FINDINGS: Scout CT demonstrates abscess of the right upper quadrant, sub a pathic. Final CT demonstrates drainage catheter within the abscess. Culture of 30 cc purulent material sent to the lab. IMPRESSION: Status post CT-guided drainage of right upper quadrant abscess. Signed, Dulcy Fanny. Dellia Nims, RPVI [facility] Radiology Electronically Signed   By: Corrie Mckusick D.O.   On: 10/12/2019 09:37        Scheduled Meds: . sodium chloride   Intravenous Once  . B-complex with vitamin C  1 tablet Oral Daily  . Chlorhexidine Gluconate Cloth  6 each Topical Daily  . ferrous sulfate  325 mg Oral BID WC  . insulin aspart  0-5 Units Subcutaneous QHS  . insulin aspart  0-9 Units Subcutaneous TID WC  . lidocaine  1 patch Transdermal QHS  . lipase/protease/amylase  36,000 Units Oral TID AC  . sodium chloride (PF)      . sodium chloride flush  10-40 mL Intracatheter Q12H   Continuous Infusions: . sodium chloride 125 mL/hr at 10/13/19 1140  . cefTRIAXone (ROCEPHIN)  IV 2 g (10/12/19 2007)     LOS: 2 days    Time spent:     Shawna Clamp, MD [facility]   If 7PM-7AM, please contact night-coverage

## 2019-10-13 NOTE — TOC Initial Note (Signed)
Transition of Care [facility]) - Initial/Assessment Note    Patient Details  Name: Brian Torres MRN: [id number] Date of Birth: Mar 12, 1961  [facility] CM/SW Contact:    Dessa Phi, RN Phone Number: 10/13/2019, 3:16 PM  Clinical Narrative:   PT/OT cons-await recc.                Expected Discharge Plan: Home/Self Care Barriers to Discharge: Continued Medical Work up   Patient Goals and CMS Choice Patient states their goals for this hospitalization and ongoing recovery are:: go home      Expected Discharge Plan and Services Expected Discharge Plan: Home/Self Care   Discharge Planning Services: CM Consult   Living arrangements for the past 2 months: Single Family Home                                      Prior Living Arrangements/Services Living arrangements for the past 2 months: Single Family Home Lives with:: Spouse Patient language and need for interpreter reviewed:: Yes Do you feel safe going back to the place where you live?: Yes      Need for Family Participation in Patient Care: No (Comment) Care giver support system in place?: Yes (comment)   Criminal Activity/Legal Involvement Pertinent to Current Situation/Hospitalization: No - Comment as needed  Activities of Daily Living Home Assistive Devices/Equipment: CBG Meter, Walker (specify type)(front wheeled walker) ADL Screening (condition at time of admission) Patient's cognitive ability adequate to safely complete daily activities?: Yes Is the patient deaf or have difficulty hearing?: No Does the patient have difficulty seeing, even when wearing glasses/contacts?: No Does the patient have difficulty concentrating, remembering, or making decisions?: No Patient able to express need for assistance with ADLs?: Yes Does the patient have difficulty dressing or bathing?: No Independently performs ADLs?: Yes (appropriate for developmental age)(patient having weakness) Does the patient have  difficulty walking or climbing stairs?: Yes(secondary to weakness) Weakness of Legs: Both Weakness of Arms/Hands: None  Permission Sought/Granted Permission sought to share information with : Case Manager Permission granted to share information with : Yes, Verbal Permission Granted              Emotional Assessment Appearance:: Appears stated age Attitude/Demeanor/Rapport: Gracious Affect (typically observed): Accepting Orientation: : Oriented to Self, Oriented to Place, Oriented to  Time, Oriented to Situation Alcohol / Substance Use: Not Applicable Psych Involvement: No (comment)  Admission diagnosis:  H/O abdominal abscess [Z87.898] Sepsis ([place]) [A41.9] Sepsis, due to unspecified organism, unspecified whether acute organ dysfunction present [facility]) [A41.9] Patient Active Problem List   Diagnosis Date Noted  . AKI (acute kidney injury) ([place]) 10/11/2019  . Goals of care, counseling/discussion 10/02/2019  . Postprocedural intraabdominal abscess 08/07/2019  . Sepsis ([place]) 08/07/2019  . Hypokalemia 08/07/2019  . History of deep vein thrombosis (DVT) of lower extremity 08/07/2019  . Pancreatic cancer ([place]) 07/20/2019  . Adenocarcinoma of head of pancreas ([place]) 07/20/2019  . Genetic testing 05/16/2019  . Family history of melanoma   . Family history of breast cancer   . Port-A-Cath in place 02/20/2019  . Cancer of head of pancreas ([place]) 01/26/2019  . Pancreatic mass 12/19/2018  . Tobacco abuse 12/19/2018  . Elevated LFTs 12/19/2018  . Hyponatremia 12/19/2018  . Anemia 10/19/2017  . Thrombocytopenia ([place]) 10/19/2017  . Leg DVT (deep venous thromboembolism), acute, left ([place]) 10/15/2017  . DM2 (diabetes mellitus, type 2) ([place]) 10/15/2017  .  HTN (hypertension) 10/15/2017  . CKD stage 3 due to type 2 diabetes mellitus ([place]) 10/15/2017   PCP:  Cari Caraway, MD Pharmacy:   [facility] [address], [place]  [place] [address] Phone: [phone] Fax: [phone]     Social Determinants of Health (SDOH) Interventions    Readmission Risk Interventions Readmission Risk Prevention Plan 10/13/2019 08/08/2019  Transportation Screening Complete Complete  Medication Review (RN Care Manager) Complete Complete  PCP or Specialist appointment within 3-5 days of discharge Not Complete Not Complete  PCP/Specialist Appt Not Complete comments - Not ready for dc  HRI or Home Care Consult Complete Not Complete  HRI or Home Care Consult Pt Refusal Comments - NA  SW Recovery Care/Counseling Consult Complete Not Complete  SW Consult Not Complete Comments - NA  Palliative Care Screening Not Applicable Not Applicable  Skilled Nursing Facility - Not Applicable  Some recent data might be hidden

## 2019-10-13 NOTE — Progress Notes (Signed)
Critical hemoglobin 6.6. Notified [place], NP.

## 2019-10-13 NOTE — Progress Notes (Addendum)
    BRIEF OVERNIGHT PROGRESS REPORT   SUBJECTIVE: Lab reporting critical hemoglobin of 6.6  OBJECTIVE:Patient was not evaluated at the bedside due to remote coverage. Per nursing no obvious source of bleeding noted. Patient denies abdominal pain. BP stable.  ASSESSMENT: Brian Torres is a 58 y.o. male with medical history significant of metastatic pancreatic cancer, T2DM, HLD, HTN, and Depression who presents with fevers/chills.  PLAN: 1. Severe Anemia - Hgb drop from 12.6 to 6.6 with no obvious source/cause without evidence hemodynamic instability. Will confirm with repeat H&H incase of possible hemodilution? - CT abdomen/pelvis shows abscess - Will obtain repeat CT abdomen/Pelvis eval for possible hemorrhage given hepatic mets - Check PT/INR - IVF resuscitation to maintain MAP>65 - H&H monitoring q6h - Blood Consent.  Transfuse 2 units PRBCs - Hold NSAIDs, steroids, ASA - Hold Eliquis pending further evaluation  2. Sepsis - suspecting due to RUQ abscess s/p RUQ drain by IR - Monitor fever curve -Trend WBCs - blood cultures positive for a Streptococcus species - Continue empiric abx with ceftriaxone - ID input appreciated  3. Metastatic pancreatic adenocarcinoma - follows with Dr. Learta Torres, care for cancer is not curative. LFTs improved today c/w with hepatic mets.   - Patient was to begin Gemcitabine/Abraxane but has been on hold. Patient's surveillance imaging shows worsening hepatic mets in addition to abscess - appreciate oncology input  4. Acute Kidney Injury - pre-renal in setting of sepsis, resolved with fluid resuscitation - monitor I/O and renal function - avoid nephrotoxic agents  5. T2DM - held oral agents and long acting insulin in setting of sepsis and hepatic mets - will use sliding scale for now  6. HTN - Holding BP meds in the setting of hypotension/sepsi  7. Hx of DVT - held apixaban given severe anemia concern for possible bleeding?    Brian Falco, DNP, CCRN, FNP-C [facility] Nurse Practitioner Between 7pm to 7am - Pager [phone]  After 7am go to [email or web address] - [email or web address]  [facility] [employer]  [phone]

## 2019-10-13 NOTE — Progress Notes (Signed)
Referring Physician(s): Sherrill,B  Supervising Physician: Arne Cleveland  Patient Status:  [facility] - In-pt  Chief Complaint: Metastatic pancreatic cancer, right upper abdominal/subhepatic abscess, jaundice   Subjective: Patient c/o HA/neck pain; also upset that he can't eat because of planned procedures; denies worsening resp issues, abd pain,N/V or visible bleeding; drop in hgb today to 6.6, getting transfused; CT A/P ordered/results pend   Allergies: Patient has no known allergies.  Medications: Prior to Admission medications   Medication Sig Start Date End Date Taking? Authorizing Provider  acetaminophen (TYLENOL) 500 MG tablet Take 1,000 mg by mouth every 6 (six) hours as needed for moderate pain or headache.   Yes [provider]  apixaban (ELIQUIS) 5 MG TABS tablet Take 1 tablet (5 mg total) by mouth 2 (two) times daily. 01/04/19  Yes Arta Silence, MD  b complex vitamins tablet Take 1 tablet by mouth daily.   Yes [provider]  CINNAMON PO Take 1,000 mg by mouth 2 (two) times daily.   Yes [provider]  Coenzyme Q10 (COQ-10 PO) Take 1 tablet by mouth daily.   Yes [provider]  CREON 36000 units CPEP capsule Take by mouth. Takes #2 with meals and #1 with snack 09/30/19  Yes [provider]  famotidine (PEPCID AC) 10 MG tablet Take 10 mg by mouth 2 (two) times daily as needed for heartburn or indigestion.   Yes [provider]  ferrous sulfate 325 (65 FE) MG EC tablet Take 325 mg by mouth 2 (two) times a day.    Yes [provider]  glimepiride (AMARYL) 4 MG tablet Take 4 mg by mouth daily with breakfast.  10/13/17  Yes [provider]  HUMALOG KWIKPEN 100 UNIT/ML KwikPen Inject 3-13 Units into the skin 2 (two) times daily as needed (if bs is over 150).  06/29/19  Yes [provider]  Insulin Glargine (BASAGLAR KWIKPEN) 100 UNIT/ML SOPN Inject 19 Units into the skin at bedtime.  10/19/18   Yes [provider]  Lidocaine (HM LIDOCAINE PATCH) 4 % PTCH Apply 1 patch topically at bedtime.   Yes [provider]  lidocaine-prilocaine (EMLA) cream Apply 1 application topically as needed (port access). 10/09/19  Yes Ladell Pier, MD  lisinopril (PRINIVIL,ZESTRIL) 10 MG tablet Take 10 mg by mouth daily. 10/13/17  Yes [provider]  LORazepam (ATIVAN) 0.5 MG tablet Take 1 tablet (0.5 mg total) by mouth every 8 (eight) hours as needed (for nausea). 05/01/19  Yes Ladell Pier, MD  Melatonin 10 MG TABS Take 20 mg by mouth at bedtime.   Yes [provider]  metFORMIN (GLUCOPHAGE) 1000 MG tablet Take 1,000 mg by mouth 2 (two) times daily. 10/13/17  Yes [provider]  methocarbamol (ROBAXIN) 500 MG tablet Take 1 tablet (500 mg total) by mouth every 6 (six) hours as needed for muscle spasms. 07/28/19  Yes [name], MD  Omega-3 Fatty Acids (FISH OIL) 1000 MG CAPS Take 1,000 mg by mouth 2 (two) times daily.    Yes [provider]  ondansetron (ZOFRAN-ODT) 4 MG disintegrating tablet Take 1 tablet (4 mg total) by mouth every 6 (six) hours as needed for nausea. 10/02/19  Yes Ladell Pier, MD  oxyCODONE (OXY IR/ROXICODONE) 5 MG immediate release tablet Take 1 tablet (5 mg total) by mouth every 6 (six) hours as needed for moderate pain, severe pain or breakthrough pain. 10/02/19  Yes Ladell Pier, MD  pioglitazone (ACTOS) 45 MG  tablet Take 45 mg by mouth daily. 10/13/17  Yes [provider]  prochlorperazine (COMPAZINE) 10 MG tablet Take 1 tablet (10 mg total) by mouth every 6 (six) hours as needed for nausea or vomiting. 04/14/19  Yes Ladell Pier, MD     Vital Signs: BP 122/75   Pulse 93   Temp 99 F (37.2 C) (Oral)   Resp 14   Ht '6\' 4"'$  (1.93 m)   Wt 193 lb 2 oz (87.6 kg)   SpO2 99%   BMI 23.51 kg/m   Physical Exam awake/alert; chest- CTA bilat; left chest Port-A-Cath in place ;heart- RRR; abd- soft, sl dist,  intact RUQ drain with turbid, light red fluid in JP, output 145 cc yesterday, site not sig tender, noted clean midline vertical wound; no LE edema  Imaging: CT Angio Chest PE W and/or Wo Contrast  Result Date: 10/11/2019 CLINICAL DATA:  49m [id number] Pt had whipple procedure done in sept 2020. Pt was to start chemo today. Bilirubin level is going up. Denies any chest pain or chest pressure. Pt tates has a rapid heart rate. eval for pE EXAM: CT ANGIOGRAPHY CHEST WITH CONTRAST TECHNIQUE: Multidetector CT imaging of the chest was performed using the standard protocol during bolus administration of intravenous contrast. Multiplanar CT image reconstructions and MIPs were obtained to evaluate the vascular anatomy. CONTRAST:  772mOMNIPAQUE IOHEXOL 350 MG/ML SOLN COMPARISON:  CT abdomen pelvis 10/10/2019 FINDINGS: Cardiovascular: No filling defects within the pulmonary arteries to suggest acute pulmonary embolism. No acute findings of the aorta or great vessels. No pericardial fluid. Mediastinum/Nodes: No axillary supraclavicular adenopathy. No mediastinal hilar adenopathy. No pericardial effusion. Port in the anterior chest wall with tip in distal SVC. Lungs/Pleura: No suspicious pulmonary nodules. No infiltrate Upper Abdomen: Innumerable hepatic metastasis again noted. Musculoskeletal: No aggressive osseous lesion Review of the MIP images confirms the above findings. IMPRESSION: 1. No evidence acute pulmonary embolism. 2. No acute pulmonary parenchymal findings. 3. Innumerable hepatic metastasis. Electronically Signed   By: StSuzy Bouchard.D.   On: 10/11/2019 09:50   CT Abdomen Pelvis W Contrast  Result Date: 10/10/2019 CLINICAL DATA:  [age]ear old male with history of pancreatic cancer diagnosed in February 2020. Elevated bilirubin. Evaluate for potential biliary obstruction. EXAM: CT ABDOMEN AND PELVIS WITH CONTRAST TECHNIQUE: Multidetector CT imaging of the abdomen and pelvis was performed using the standard  protocol following bolus administration of intravenous contrast. CONTRAST:  10065mMNIPAQUE IOHEXOL 300 MG/ML  SOLN COMPARISON:  CT the abdomen and pelvis 08/24/2019. Abdominal MRI 09/05/2019. FINDINGS: Lower chest: Unremarkable. Hepatobiliary: Marked increase in number and size of what are now innumerable low-intermediate attenuation lesions scattered throughout the hepatic parenchyma, largest of which measure up to 3.6 x 2.4 cm in segment 4B of the liver (axial image 37 of series 3). Some of these lesions appear elongated within the hepatic parenchyma best appreciated on sagittal image 53 of series 7, where that length of the lesion previously described measures up to 11.6 cm. These lesions are exerting local mass effect upon adjacent structures causing compression of central bile ducts resulting in mild to moderate intrahepatic biliary ductal dilatation throughout the right lobe of the liver. Status post cholecystectomy is part of Whipple procedure. In the gallbladder fossa there is a large amount of intermediate attenuation fluid and/or soft tissue which has a thick rim of peripheral enhancement estimated to measure approximately 6.1 x 6.6 x 3.5 cm (axial image 43 of series 3 and coronal image 33 of series 6).  This soft tissue appears intimately associated both with the undersurface of the liver, as well as several adjacent small bowel loops, in close proximity to the presumed location of the hepaticojejunostomy. Previously noted fluid collection beneath the right lobe of the liver (which previously had a percutaneous drainage catheter on prior CT 08/24/2019) is again noted on today's examination, currently measuring 5.7 x 5.3 x 6.9 cm (axial image 51 of series 3 and coronal image 34 of series 6) with thick peripheral rim of soft tissue and enhancement which appears to partially extend into the right oblique musculature best appreciated on axial image 47 of series 3 and coronal image 33 of series 6). Pancreas:  Postoperative changes of Whipple procedure. Spleen: Unremarkable. Adrenals/Urinary Tract: 1.5 cm low-attenuation lesion in the upper pole of the right kidney, compatible with a simple cyst. Left kidney and bilateral adrenal glands are normal in appearance. No hydroureteronephrosis. Urinary bladder is normal in appearance. Stomach/Bowel: Postoperative changes of Whipple procedure. Loops of small bowel and colon traversing beneath the right lobe of the liver appear thickened as they come in close association with the previously noted fluid and/or soft tissue collection in this region. No pathologic dilatation of small bowel or colon. Normal appendix. Vascular/Lymphatic: Aortic atherosclerosis, without evidence of aneurysm or dissection in the abdominal or pelvic vasculature. Small of filling defect in the proximal portal vein (axial image 36 of series 3), concerning for nonobstructive thrombus. Enlarged lymph node immediately anterior to the superior mesenteric vein (axial image 43 of series 3) measuring 1.5 cm in short axis. Reproductive: Prostate gland and seminal vesicles are unremarkable in appearance. Other: Trace volume of perihepatic ascites.  No pneumoperitoneum. Musculoskeletal: There are no aggressive appearing lytic or blastic lesions noted in the visualized portions of the skeleton. IMPRESSION: 1. Marked increased number and size of innumerable hepatic lesions, as well as persistence of right infra hepatic postoperative fluid collection and development of an additional fluid and/or soft tissue collection beneath the gallbladder fossa. Overall, the findings are favored to reflect progressive metastatic disease to the liver, however, there is overlap with potential infectious etiologies (multiple intrahepatic abscesses and subhepatic abscesses). Guided sampling of one or more of these lesions is suggested to establish a definitive diagnosis. 2. 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 3.  Interval development of mildly enlarged mesenteric lymph node measuring 1.5 cm in short axis immediately anterior to the portal vein, suspicious for a metastatic lymph node, although reactive lymphadenopathy in the setting of extensive infection is not excluded. 4. Additional incidental findings, as above. Electronically Signed   By: Vinnie Langton M.D.   On: 10/10/2019 17:34   DG Chest Port 1 View  Result Date: 10/11/2019 CLINICAL DATA:  Chills. Additional history provided: Syncopal episode, history of liver CA. EXAM: PORTABLE CHEST 1 VIEW COMPARISON:  CT angiogram chest 08/07/2019, chest radiograph 08/07/2011 FINDINGS: Left chest infusion port catheter with tip projecting in the region of the superior cavoatrial junction. Heart size within normal limits. No airspace consolidation within the lungs. No evidence of pleural effusion or pneumothorax. No acute bony abnormality. IMPRESSION: No evidence of acute cardiopulmonary abnormality. Electronically Signed   By: Kellie Simmering DO   On: 10/11/2019 07:57   CT IMAGE GUIDED DRAINAGE BY PERCUTANEOUS CATHETER  Result Date: 10/12/2019 INDICATION: 58 year old male with a history of right upper quadrant abscess EXAM: CT GUIDED DRAINAGE OF  ABSCESS MEDICATIONS: The patient is currently admitted to the hospital and receiving intravenous antibiotics. The antibiotics were administered within an  appropriate time frame prior to the initiation of the procedure. ANESTHESIA/SEDATION: 2.0 mg IV Versed 100 mcg IV Fentanyl Moderate Sedation Time:  12 minutes The patient was continuously monitored during the procedure by the interventional radiology nurse under my direct supervision. COMPLICATIONS: None TECHNIQUE: Informed written consent was obtained from the patient after a thorough discussion of the procedural risks, benefits and alternatives. All questions were addressed. Maximal Sterile Barrier Technique was utilized including caps, mask, sterile gowns, sterile gloves,  sterile drape, hand hygiene and skin antiseptic. A timeout was performed prior to the initiation of the procedure. PROCEDURE: The operative field was prepped with Chlorhexidine in a sterile fashion, and a sterile drape was applied covering the operative field. A sterile gown and sterile gloves were used for the procedure. Local anesthesia was provided with 1% Lidocaine. Scout CT was acquired. The patient was prepped and draped in usual sterile fashion. 1% lidocaine was used for local anesthesia. Using CT guidance, guide needle was advanced into the abscess of the right upper quadrant. Modified Seldinger technique was used to place a 10 [place] drain. 30 cc of purulent material aspirated for a culture. Drain was attached to bulb suction and sutured in position. Patient tolerated the procedure well and remained hemodynamically stable throughout. No complications were encountered and no significant blood loss. FINDINGS: Scout CT demonstrates abscess of the right upper quadrant, sub a pathic. Final CT demonstrates drainage catheter within the abscess. Culture of 30 cc purulent material sent to the lab. IMPRESSION: Status post CT-guided drainage of right upper quadrant abscess. Signed, Dulcy Fanny. Dellia Nims, RPVI [facility] Electronically Signed   By: Corrie Mckusick D.O.   On: 10/12/2019 09:37    Labs:  CBC: Recent Labs    10/09/19 1024 10/11/19 0710 10/12/19 0445 10/13/19 0400  WBC 13.0* 3.1* 10.1 10.8*  HGB 8.8* 12.9* 7.2* 6.6*  HCT 26.6* 39.8 22.3* 20.4*  PLT 268 169 181 196    COAGS: Recent Labs    12/22/18 0159 12/23/18 0014 09/26/19 1205 10/11/19 0710 10/12/19 0445 10/13/19 0831  INR  --   --  1.2 1.9* 2.1* 2.3*  APTT 80* 65* 53* 36  --   --     BMP: Recent Labs    10/09/19 1024 10/11/19 0710 10/12/19 0445 10/13/19 0400  NA 131* 130* 133* 133*  K 5.1 4.1 4.1 3.9  CL 96* 96* 105 105  CO2 25 21* 17* 19*  GLUCOSE 245*  170* 106* 173*  BUN 21* 32* 33* 19  CALCIUM 8.7* 8.3* 7.5* 7.6*  CREATININE 1.09 1.30* 1.15 0.94  GFRNONAA >60 60* >60 >60  GFRAA >60 >60 >60 >60    LIVER FUNCTION TESTS: Recent Labs    10/09/19 1024 10/11/19 0710 10/12/19 0445 10/13/19 0400  BILITOT 5.0* 6.8* 6.2* 6.4*  AST 120* 98* 551* 225*  ALT 81* 72* 184* 146*  ALKPHOS 1,292* 894* 747* 660*  PROT 7.3 6.7 6.0* 5.5*  ALBUMIN 2.2* 2.1* 1.7* 1.5*    Assessment and Plan: Pt with history of metastatic pancreatic carcinoma, status post Whipple on 07/20/2019. Status post right upper quadrant fluid collection drain on 08/09/2019 with removal on 08/24/2019.  Drain fluid cultures with multiple organisms at the time.  He now presents to [facility] with recent fever/chills/presyncopal episode, jaundice.  He is COVID-19 negative. CT A/P 12/8 revealed:  1. Marked increased number and size of innumerable hepatic lesions, as well as persistence of right infra hepatic postoperative fluid collection  and development of an additional fluid and/or soft tissue collection beneath the gallbladder fossa. Overall, the findings are favored to reflect progressive metastatic disease to the liver, however, there is overlap with potential infectious etiologies (multiple intrahepatic abscesses and subhepatic abscesses). Guided sampling of one or more of these lesions is suggested to establish a definitive diagnosis. 2. 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 3. Interval development of mildly enlarged mesenteric lymph node measuring 1.5 cm in short axis immediately anterior to the portal vein, suspicious for a metastatic lymph node, although reactive lymphadenopathy in the setting of extensive infection is not Excluded.  CT angio chest done 12/9 revealed no acute PE or acute pulmonary parenchymal findings.  Innumerable hepatic metastases noted.  He is on Eliquis for prior left lower extremity DVT with last dose 12/10; he is  status post drainage of recurrent right upper quadrant abscess on 10/12/2019 ; temp 99; WBC 10.8, hemoglobin 6.6 -getting transfusion, platelets 196k, PT 25.3, INR 2.3, total bilirubin 6.4, creatinine normal, drain fluid cultures pending, blood cultures with Streptococcus; results of follow-up CT abdomen pelvis done today revealed: 1. Perihepatic abscess collection has resolved with the percutaneous drain. In addition, the collection adjacent to the hepatic flexure of the colon has either resolved or decompressed in the interim and probably related to the percutaneous drain. However, the hepatic flexure area is poorly characterized on this examination. 2. Innumerable liver lesions compatible with metastatic disease. In addition, there are areas of intrahepatic biliary dilatation likely related to obstruction from liver lesions. Biliary dilatation is likely segmental and scattered throughout the liver. 3. Increased ascites in the abdomen and pelvis. Overall ascites volume is small. Trace pleural fluid has developed.   Request now received from oncology for [facility] with biliary drain placement today due to persistent hyperbilirubinemia and mild to moderate intrahepatic biliary ductal dilatation secondary to mass effect from hepatic lesions.  Latest imaging was reviewed by Dr. Vernard Gambles.  Details/risks of biliary drain placement, including but not limited to, internal bleeding, infection, injury to adjacent structures discussed with patient and spouse with their understanding and consent.  Procedure scheduled for later today.  Electronically Signed: D. Rowe Robert, PA-C 10/13/2019, 9:29 AM   I spent a total of 25 minutes at the the patient's bedside AND on the patient's hospital floor or unit, greater than 50% of which was counseling/coordinating care for     Patient ID: Brian Torres, male   DOB: [birth year], 58 y.o.   MRN: [id number]

## 2019-10-13 NOTE — Progress Notes (Addendum)
HEMATOLOGY-ONCOLOGY PROGRESS NOTE  SUBJECTIVE: TMax 99.9 in the past 24 hours. BC on admission positive for Strep. Sensitivity pending. Drainage of abdominal abscess shows abundant WBCs and moderate gram negative coccobacilli.  Denies chills.  Denies chest discomfort, shortness of breath, nausea and vomiting.  Has some mild discomfort at the abdominal drainage catheter site.  This seems to be positional.  Denies bleeding.  Oncology History  Cancer of head of pancreas ([place])  01/26/2019 Initial Diagnosis   Cancer of head of pancreas ([place])   01/26/2019 Cancer Staging   Staging form: Exocrine Pancreas, AJCC 8th Edition - Clinical: Stage Unknown (cTX, cN0, cM0) - Signed by Ladell Pier, MD on 01/26/2019   02/06/2019 - 06/19/2019 Chemotherapy   The patient had palonosetron (ALOXI) injection 0.25 mg, 0.25 mg, Intravenous,  Once, 8 of 8 cycles Administration: 0.25 mg (02/06/2019), 0.25 mg (02/20/2019), 0.25 mg (03/09/2019), 0.25 mg (03/21/2019), 0.25 mg (04/05/2019), 0.25 mg (04/24/2019), 0.25 mg (05/22/2019), 0.25 mg (06/06/2019) pegfilgrastim-cbqv (UDENYCA) injection 6 mg, 6 mg, Subcutaneous, Once, 2 of 2 cycles Administration: 6 mg (02/22/2019) irinotecan (CAMPTOSAR) 340 mg in dextrose 5 % 500 mL chemo infusion, 150 mg/m2 = 340 mg (100 % of original dose 150 mg/m2), Intravenous,  Once, 8 of 8 cycles Dose modification: 150 mg/m2 (original dose 150 mg/m2, Cycle 1, Reason: Provider Judgment), 120 mg/m2 (original dose 150 mg/m2, Cycle 3, Reason: Provider Judgment) Administration: 340 mg (02/06/2019), 340 mg (02/20/2019), 260 mg (03/09/2019), 260 mg (03/21/2019), 260 mg (04/05/2019), 260 mg (04/24/2019), 260 mg (05/22/2019), 260 mg (06/06/2019) leucovorin 892 mg in dextrose 5 % 250 mL infusion, 400 mg/m2 = 892 mg, Intravenous,  Once, 8 of 8 cycles Dose modification: 300 mg/m2 (original dose 400 mg/m2, Cycle 3, Reason: Provider Judgment) Administration: 892 mg (02/06/2019), 892 mg (02/20/2019), 670 mg (03/21/2019), 670 mg (03/09/2019),  670 mg (04/05/2019), 642 mg (04/24/2019), 642 mg (05/22/2019), 642 mg (06/06/2019) oxaliplatin (ELOXATIN) 200 mg in dextrose 5 % 500 mL chemo infusion, 190 mg, Intravenous,  Once, 8 of 8 cycles Dose modification: 65 mg/m2 (original dose 85 mg/m2, Cycle 6, Reason: Provider Judgment) Administration: 200 mg (02/06/2019), 200 mg (02/20/2019), 200 mg (03/09/2019), 200 mg (03/21/2019), 200 mg (04/05/2019), 140 mg (04/24/2019), 140 mg (05/22/2019), 140 mg (06/06/2019) fosaprepitant (EMEND) 150 mg, dexamethasone (DECADRON) 10 mg in sodium chloride 0.9 % 145 mL IVPB, , Intravenous,  Once, 8 of 8 cycles Administration:  (02/06/2019),  (02/20/2019),  (03/09/2019),  (03/21/2019),  (04/05/2019),  (04/24/2019),  (05/22/2019),  (06/06/2019) fluorouracil (ADRUCIL) 5,350 mg in sodium chloride 0.9 % 143 mL chemo infusion, 2,400 mg/m2 = 5,350 mg, Intravenous, 1 Day/Dose, 8 of 8 cycles Dose modification: 1,800 mg/m2 (original dose 2,400 mg/m2, Cycle 3, Reason: Provider Judgment) Administration: 5,350 mg (02/06/2019), 5,350 mg (02/20/2019), 4,000 mg (03/09/2019), 4,000 mg (03/21/2019), 4,000 mg (04/05/2019), 3,850 mg (04/24/2019), 3,850 mg (05/22/2019), 3,850 mg (06/06/2019)  for chemotherapy treatment.    05/09/2019 Genetic Testing   Negative genetic testing on the CustomNext-Cancer+RNAinsight.  The CustomNext-Expanded gene panel offered by [facility] and includes sequencing and rearrangement analysis for the following 81 genes: AIP, ALK, APC*, ATM*, AXIN2, BAP1, BARD1, BLM, BMPR1A, BRCA1*, BRCA2*, BRIP1*, CDC73, CDH1*, CDK4, CDKN1B, CDKN2A, CHEK2*, CTNNA1, DICER1, FANCC, FH, FLCN, GALNT12, HOXB13, KIT, MAX, MEN1, MET, MLH1*, MRE11A, MSH2*, MSH6*, MUTYH*, NBN, NF1*, NF2, NTHL1, PALB2*, PDGFRA, PHOX2B, PMS2*, POLD1, POLE, POT1, PRKAR1A, PTCH1, PTEN*, RAD50, RAD51C*, RAD51D*, RB1, RET, SDHA, SDHAF2, SDHB, SDHC, SDHD, SMAD4, SMARCA4, SMARCB1, SMARCE1, STK11, SUFU, TMEM127, TP53*, TSC1, TSC2, VHL and XRCC2 (sequencing and deletion/duplication);  CASR, CFTR, CPA1,  CTRC, EGFR, MITF, PRSS1 and SPINK1 (sequencing only); EPCAM and GREM1 (deletion/duplication only). DNA and RNA analyses performed for * genes. The report date is May 09, 2019.   10/09/2019 -  Chemotherapy   The patient had PACLitaxel-protein bound (ABRAXANE) chemo infusion 200 mg, 100 mg/m2 = 200 mg (100 % of original dose 100 mg/m2), Intravenous,  Once, 0 of 4 cycles Dose modification: 100 mg/m2 (original dose 100 mg/m2, Cycle 1, Reason: Provider Judgment) gemcitabine (GEMZAR) 1,672 mg in sodium chloride 0.9 % 250 mL chemo infusion, 800 mg/m2 = 1,672 mg (100 % of original dose 800 mg/m2), Intravenous,  Once, 0 of 4 cycles Dose modification: 800 mg/m2 (original dose 800 mg/m2, Cycle 1, Reason: Provider Judgment)  for chemotherapy treatment.    Pancreatic cancer ([place])  07/20/2019 Initial Diagnosis   Pancreatic cancer ([place])   10/09/2019 -  Chemotherapy   The patient had PACLitaxel-protein bound (ABRAXANE) chemo infusion 200 mg, 100 mg/m2 = 200 mg (100 % of original dose 100 mg/m2), Intravenous,  Once, 0 of 4 cycles Dose modification: 100 mg/m2 (original dose 100 mg/m2, Cycle 1, Reason: Provider Judgment) gemcitabine (GEMZAR) 1,672 mg in sodium chloride 0.9 % 250 mL chemo infusion, 800 mg/m2 = 1,672 mg (100 % of original dose 800 mg/m2), Intravenous,  Once, 0 of 4 cycles Dose modification: 800 mg/m2 (original dose 800 mg/m2, Cycle 1, Reason: Provider Judgment)  for chemotherapy treatment.       PHYSICAL EXAMINATION:  Vitals:   10/13/19 0419 10/13/19 0842  BP: (!) 120/57 127/71  Pulse: 93 99  Resp: 18 14  Temp: 98.5 F (36.9 C) 97.6 F (36.4 C)  SpO2: 99% 99%   Filed Weights   10/12/19 1506  Weight: 193 lb 2 oz (87.6 kg)    Intake/Output from previous day: 12/10 0701 - 12/11 0700 In: 600 [I.V.:500; IV Piggyback:100] Out: 145 [Drains:145]  GENERAL: Awake and alert, no distress SKIN: Jaundice noted LUNGS: clear to auscultation and percussion with normal breathing effort HEART:  regular rate & rhythm and no murmurs and no lower extremity edema ABDOMEN: Right-sided drain in place.,  Fullness throughout the right upper abdomen, midline incision is healed with no surrounding erythema Musculoskeletal:no cyanosis of digits and no clubbing  NEURO: alert & oriented x 3 with fluent speech, no focal motor/sensory deficits  Port-A-Cath without erythema  LABORATORY DATA:  I have reviewed the data as listed CMP Latest Ref Rng & Units 10/13/2019 10/12/2019 10/11/2019  Glucose 70 - 99 mg/dL 173(H) 106(H) 170(H)  BUN 6 - 20 mg/dL 19 33(H) 32(H)  Creatinine 0.61 - 1.24 mg/dL 0.94 1.15 1.30(H)  Sodium 135 - 145 mmol/L 133(L) 133(L) 130(L)  Potassium 3.5 - 5.1 mmol/L 3.9 4.1 4.1  Chloride 98 - 111 mmol/L 105 105 96(L)  CO2 22 - 32 mmol/L 19(L) 17(L) 21(L)  Calcium 8.9 - 10.3 mg/dL 7.6(L) 7.5(L) 8.3(L)  Total Protein 6.5 - 8.1 g/dL 5.5(L) 6.0(L) 6.7  Total Bilirubin 0.3 - 1.2 mg/dL 6.4(H) 6.2(H) 6.8(H)  Alkaline Phos 38 - 126 U/L 660(H) 747(H) 894(H)  AST 15 - 41 U/L 225(H) 551(H) 98(H)  ALT 0 - 44 U/L 146(H) 184(H) 72(H)    Lab Results  Component Value Date   WBC 10.8 (H) 10/13/2019   HGB 6.6 (LL) 10/13/2019   HCT 20.4 (L) 10/13/2019   MCV 88.7 10/13/2019   PLT 196 10/13/2019   NEUTROABS 8.6 (H) 10/13/2019    CT Angio Chest PE W and/or Wo Contrast  Result Date:  10/11/2019 CLINICAL DATA:  79m omni350 Pt had whipple procedure done in sept 2020. Pt was to start chemo today. Bilirubin level is going up. Denies any chest pain or chest pressure. Pt tates has a rapid heart rate. eval for pE EXAM: CT ANGIOGRAPHY CHEST WITH CONTRAST TECHNIQUE: Multidetector CT imaging of the chest was performed using the standard protocol during bolus administration of intravenous contrast. Multiplanar CT image reconstructions and MIPs were obtained to evaluate the vascular anatomy. CONTRAST:  783mOMNIPAQUE IOHEXOL 350 MG/ML SOLN COMPARISON:  CT abdomen pelvis 10/10/2019 FINDINGS: Cardiovascular:  No filling defects within the pulmonary arteries to suggest acute pulmonary embolism. No acute findings of the aorta or great vessels. No pericardial fluid. Mediastinum/Nodes: No axillary supraclavicular adenopathy. No mediastinal hilar adenopathy. No pericardial effusion. Port in the anterior chest wall with tip in distal SVC. Lungs/Pleura: No suspicious pulmonary nodules. No infiltrate Upper Abdomen: Innumerable hepatic metastasis again noted. Musculoskeletal: No aggressive osseous lesion Review of the MIP images confirms the above findings. IMPRESSION: 1. No evidence acute pulmonary embolism. 2. No acute pulmonary parenchymal findings. 3. Innumerable hepatic metastasis. Electronically Signed   By: StSuzy Bouchard.D.   On: 10/11/2019 09:50   CT Abdomen Pelvis W Contrast  Result Date: 10/10/2019 CLINICAL DATA:  [age]ear old male with history of pancreatic cancer diagnosed in February 2020. Elevated bilirubin. Evaluate for potential biliary obstruction. EXAM: CT ABDOMEN AND PELVIS WITH CONTRAST TECHNIQUE: Multidetector CT imaging of the abdomen and pelvis was performed using the standard protocol following bolus administration of intravenous contrast. CONTRAST:  10077mMNIPAQUE IOHEXOL 300 MG/ML  SOLN COMPARISON:  CT the abdomen and pelvis 08/24/2019. Abdominal MRI 09/05/2019. FINDINGS: Lower chest: Unremarkable. Hepatobiliary: Marked increase in number and size of what are now innumerable low-intermediate attenuation lesions scattered throughout the hepatic parenchyma, largest of which measure up to 3.6 x 2.4 cm in segment 4B of the liver (axial image 37 of series 3). Some of these lesions appear elongated within the hepatic parenchyma best appreciated on sagittal image 53 of series 7, where that length of the lesion previously described measures up to 11.6 cm. These lesions are exerting local mass effect upon adjacent structures causing compression of central bile ducts resulting in mild to moderate  intrahepatic biliary ductal dilatation throughout the right lobe of the liver. Status post cholecystectomy is part of Whipple procedure. In the gallbladder fossa there is a large amount of intermediate attenuation fluid and/or soft tissue which has a thick rim of peripheral enhancement estimated to measure approximately 6.1 x 6.6 x 3.5 cm (axial image 43 of series 3 and coronal image 33 of series 6). This soft tissue appears intimately associated both with the undersurface of the liver, as well as several adjacent small bowel loops, in close proximity to the presumed location of the hepaticojejunostomy. Previously noted fluid collection beneath the right lobe of the liver (which previously had a percutaneous drainage catheter on prior CT 08/24/2019) is again noted on today's examination, currently measuring 5.7 x 5.3 x 6.9 cm (axial image 51 of series 3 and coronal image 34 of series 6) with thick peripheral rim of soft tissue and enhancement which appears to partially extend into the right oblique musculature best appreciated on axial image 47 of series 3 and coronal image 33 of series 6). Pancreas: Postoperative changes of Whipple procedure. Spleen: Unremarkable. Adrenals/Urinary Tract: 1.5 cm low-attenuation lesion in the upper pole of the right kidney, compatible with a simple cyst. Left kidney and bilateral adrenal glands are normal in  appearance. No hydroureteronephrosis. Urinary bladder is normal in appearance. Stomach/Bowel: Postoperative changes of Whipple procedure. Loops of small bowel and colon traversing beneath the right lobe of the liver appear thickened as they come in close association with the previously noted fluid and/or soft tissue collection in this region. No pathologic dilatation of small bowel or colon. Normal appendix. Vascular/Lymphatic: Aortic atherosclerosis, without evidence of aneurysm or dissection in the abdominal or pelvic vasculature. Small of filling defect in the proximal portal  vein (axial image 36 of series 3), concerning for nonobstructive thrombus. Enlarged lymph node immediately anterior to the superior mesenteric vein (axial image 43 of series 3) measuring 1.5 cm in short axis. Reproductive: Prostate gland and seminal vesicles are unremarkable in appearance. Other: Trace volume of perihepatic ascites.  No pneumoperitoneum. Musculoskeletal: There are no aggressive appearing lytic or blastic lesions noted in the visualized portions of the skeleton. IMPRESSION: 1. Marked increased number and size of innumerable hepatic lesions, as well as persistence of right infra hepatic postoperative fluid collection and development of an additional fluid and/or soft tissue collection beneath the gallbladder fossa. Overall, the findings are favored to reflect progressive metastatic disease to the liver, however, there is overlap with potential infectious etiologies (multiple intrahepatic abscesses and subhepatic abscesses). Guided sampling of one or more of these lesions is suggested to establish a definitive diagnosis. 2. 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 3. Interval development of mildly enlarged mesenteric lymph node measuring 1.5 cm in short axis immediately anterior to the portal vein, suspicious for a metastatic lymph node, although reactive lymphadenopathy in the setting of extensive infection is not excluded. 4. Additional incidental findings, as above. Electronically Signed   By: Vinnie Langton M.D.   On: 10/10/2019 17:34   DG Chest Port 1 View  Result Date: 10/11/2019 CLINICAL DATA:  Chills. Additional history provided: Syncopal episode, history of liver CA. EXAM: PORTABLE CHEST 1 VIEW COMPARISON:  CT angiogram chest 08/07/2019, chest radiograph 08/07/2011 FINDINGS: Left chest infusion port catheter with tip projecting in the region of the superior cavoatrial junction. Heart size within normal limits. No airspace consolidation within the lungs. No evidence of  pleural effusion or pneumothorax. No acute bony abnormality. IMPRESSION: No evidence of acute cardiopulmonary abnormality. Electronically Signed   By: Kellie Simmering DO   On: 10/11/2019 07:57   [place] CORE BIOPSY (LIVER)  Result Date: 09/26/2019 INDICATION: History of pancreatic carcinoma with development multiple small liver lesions. EXAM: ULTRASOUND GUIDED CORE BIOPSY OF LIVER MEDICATIONS: None. ANESTHESIA/SEDATION: Fentanyl 100 mcg IV; Versed 4.0 mg IV Moderate Sedation Time:  20 minutes. The patient was continuously monitored during the procedure by the interventional radiology nurse under my direct supervision. PROCEDURE: The procedure, risks, benefits, and alternatives were explained to the patient. Questions regarding the procedure were encouraged and answered. The patient understands and consents to the procedure. A time-out was performed prior to initiating the procedure. Ultrasound was performed of the liver. The abdominal wall was prepped with chlorhexidine in a sterile fashion, and a sterile drape was applied covering the operative field. A sterile gown and sterile gloves were used for the procedure. Local anesthesia was provided with 1% Lidocaine. After choosing lesions for sampling, a 17 gauge trocar needle was advanced into the liver. After confirming needle tip position, coaxial 18 gauge core biopsy samples were obtained. Three intact core biopsy samples were submitted in formalin. Gel-Foam pledgets were advanced through the outer needle as it was retracted. Additional ultrasound was performed. COMPLICATIONS: None immediate. FINDINGS:  Ultrasound demonstrates numerous solid hypoechoic rounded lesions throughout the liver parenchyma. The largest measures approximately 1.9 cm within the inferior right lobe near the juncture with the left lobe. Lesions have likely increased in size and number since the MRI study on 09/05/2019 and are suspicious for metastatic disease by imaging. Solid tissue was obtained  with biopsy. IMPRESSION: Ultrasound-guided core biopsy performed adjacent hypoechoic solid lesions within the inferior right lobe of the liver. The largest measures approximately 1.9 cm and lesions have likely progressed since the MRI study on 09/05/2019. Electronically Signed   By: Aletta Edouard M.D.   On: 09/26/2019 14:49   CT IMAGE GUIDED DRAINAGE BY PERCUTANEOUS CATHETER  Result Date: 10/12/2019 INDICATION: 58 year old male with a history of right upper quadrant abscess EXAM: CT GUIDED DRAINAGE OF  ABSCESS MEDICATIONS: The patient is currently admitted to the hospital and receiving intravenous antibiotics. The antibiotics were administered within an appropriate time frame prior to the initiation of the procedure. ANESTHESIA/SEDATION: 2.0 mg IV Versed 100 mcg IV Fentanyl Moderate Sedation Time:  12 minutes The patient was continuously monitored during the procedure by the interventional radiology nurse under my direct supervision. COMPLICATIONS: None TECHNIQUE: Informed written consent was obtained from the patient after a thorough discussion of the procedural risks, benefits and alternatives. All questions were addressed. Maximal Sterile Barrier Technique was utilized including caps, mask, sterile gowns, sterile gloves, sterile drape, hand hygiene and skin antiseptic. A timeout was performed prior to the initiation of the procedure. PROCEDURE: The operative field was prepped with Chlorhexidine in a sterile fashion, and a sterile drape was applied covering the operative field. A sterile gown and sterile gloves were used for the procedure. Local anesthesia was provided with 1% Lidocaine. Scout CT was acquired. The patient was prepped and draped in usual sterile fashion. 1% lidocaine was used for local anesthesia. Using CT guidance, guide needle was advanced into the abscess of the right upper quadrant. Modified Seldinger technique was used to place a 10 [place] drain. 30 cc of purulent material aspirated for  a culture. Drain was attached to bulb suction and sutured in position. Patient tolerated the procedure well and remained hemodynamically stable throughout. No complications were encountered and no significant blood loss. FINDINGS: Scout CT demonstrates abscess of the right upper quadrant, sub a pathic. Final CT demonstrates drainage catheter within the abscess. Culture of 30 cc purulent material sent to the lab. IMPRESSION: Status post CT-guided drainage of right upper quadrant abscess. Signed, Dulcy Fanny. Dellia Nims, RPVI [facility] Radiology Electronically Signed   By: Corrie Mckusick D.O.   On: 10/12/2019 09:37    ASSESSMENT AND PLAN: 1. Pancreas head mass  Presented with jaundice, bilirubin elevated at 21.4  CTs abdomen/pelvis 12/20/2018-severe intrahepatic and extrahepatic biliary dilatation due to a possible 3.5 x 2.5 cm solid mass in the pancreatic head. Pancreatic ductal dilatation noted as well with multiple cystic lesions in the pancreatic tail and body.   CT chest 12/20/2018-occasional nonspecific small pulmonary nodules felt to likely be related to prior infection or inflammation.   ERCP by Dr. Watt Climes 12/21/2018. The major papilla was on the rim of a diverticulum. The minor papilla appeared to be bulging. A biopsy of the duodenum adjacent to the minor papilla was performed. Plastic stent was placed into the ventral pancreatic duct. Biliary sphincterotomy performed. Covered metal stent placed into the common bile duct. Bile duct brushings showed benign reactive/reparative changes. Biopsy of polypoid duodenal mucosa suggestive of nodular peptic duodenitis, negative  for dysplasia or malignancy.   Upper EUS by Dr. Paulita Fujita on 01/03/2019 showed a few cystic lesions in the pancreatic body and pancreatic tail. A stent was visualized in the common bile duct. Mass identified in the pancreatic head with fine-needle aspiration performed. Lesion  appeared to abut or potentially superficially invade the superior mesenteric vein. No lymphadenopathy. No involvement of the superior mesenteric artery or celiac artery. Pathology returned suspicious for malignancy. Dr. Paulita Fujita notes if pathology shows adenocarcinoma the lesion would be staged T3 N0 MX.  Repeat ERCP and placement of a metal bile duct stent 01/24/2019  EUS 01/24/2019, T3N0 pancreas head mass with abutment of the SMV, FNA of the pancreas mass revealed adenocarcinoma, cytology from the bile duct stent revealed adenocarcinoma  Cycle 1 FOLFIRINOX4/04/2019  Cycle 2 FOLFIRINOX4/20/2020, Udenyca added  Cycle 3 FOLFIRINOX 03/09/2019, Irinotecan and 5-fluorouracil dose reduced, Udenyca held  Cycle 4 FOLFIRINOX 03/21/2019-Udenyca held  Cycle 5 FOLFIRINOX6/01/2019  Cycle 6 FOLFIRINOX 04/24/2019 (oxaliplatin dose reduced due to poor tolerance of chemotherapy)  CT 05/11/2019-decreased size of pancreas head mass, unchanged peripancreatic lymph node, no evidence of metastatic disease, improved/resolved fluid collections at the pancreas tail  Cycle 7 FOLFIRINOX 05/22/2019  Cycle 8 FOLFIRINOX 06/06/2019  07/20/2019 pancreaticoduodenectomy 07/20/2019-pT2, pN0 (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  Treatment effect absent, extensive residual cancer with no evident tumor regression)  MRI abdomen 09/05/2019-reaccumulation of fluid along the perihepatic margin following presumed removal of the pigtail drainage catheter that was present in this area on the study of 08/24/2019; some fluid in the region of the porta hepatis and pancreaticobiliary limb; small collection of fluid just inferior to the interlobar fissure in the liver along the course of the previous drain;  tract extending from this area to the midline abdominal wound; innumerable areas of abnormality seen on T2 and diffusion throughout the liver, largest in the range of 1 cm.  All areas of the liver affected, both right and left hepatic lobes.  Along the gallbladder fossa is a peripherally enhancing 1.8 x 1.3 cm focus.  Ultrasound-guided biopsy of a liver lesion 09/26/2019-adenocarcinoma compatible with pancreatic adenocarcinoma 2. Biliary obstruction status post stent placement 3. History of left lower extremity DVT 2017, 2018-on Eliquis. 4. Type 2 diabetes 5. Hypertension 6. Chronic kidney disease 7. Tobacco use 8. Anemia, macrocytic. Transfused 2 units of blood 12/20/2018.On oral iron. Improved 02/01/2019 and 02/06/2019. 9. Port-A-Cath placement 02/02/2019, Dr. Barry Dienes 10. 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 Hospitalized 08/07/2019 through 08/11/2019 with postsurgical intra-abdominal fluid collection.  CT 08/07/2019 with 2 rim-enhancing fluid collections in the right upper quadrant concerning for abscesses, moderate wall thickening and pericolonic inflammatory changes involving the distal ascending, transverse and descending colon consistent with colitis.  Blood culture 08/07/2019 + for Prevotella buccae beta-lactamase positive; status post CT-guided aspiration of intra-abdominal fluid collection and drain placement on 08/09/2019, culture with multiple organisms present, none predominant; follow-up CT 08/24/2019 with resolution of dominant fluid collection within the right upper abdominal quadrant, tiny serpiginous fluid collection within the ventral aspect of the right upper abdomen, interval development of multiple ill-defined hypoattenuating liver lesions; MRI abdomen 09/05/2019 reaccumulation of fluid along the perihepatic margin following presumed removal of the pigtail drainage catheter that was present in this area on  the study of 08/24/2019; some fluid in  the region of the porta hepatis and pancreaticobiliary limb; small collection of fluid just inferior to the interlobar fissure in the liver along the course of the previous drain; tract extending from this area to the midline abdominal wound; innumerable areas of abnormality seen on T2 and diffusion throughout the liver, largest in the range of 1 cm.  All areas of the liver affected, both right and left hepatic lobes.  Along the gallbladder fossa is a peripherally enhancing 1.8 x 1.3 cm focus. [facility] admission 10/11/2019-sepsis, blood cultures positive for a Streptococcus species, likely source is right upper quadrant abscess  Mr. Blakely appears stable.  Blood cultures positive for Streptococcus.  Sensitivities pending.  He is currently on ceftriaxone with improvement of his fevers.  Repeat blood cultures were drawn earlier today and are pending.  Total bilirubin continues to be elevated due to sepsis, liver metastases, and possible bile duct obstruction.  The patient is more anemic this morning.  Recommendations: 1.  Continue IV antibiotics.  Appreciate input from infectious disease.  Repeat blood cultures drawn earlier today and are pending. 2.  We will ask interventional radiology to place biliary drain for possible bile duct obstruction. 3.  He is currently off Eliquis and we will continue to hold this. 4.  The patient is receiving 2 units PRBCs today.  Monitor CBC and liver function closely.  Transfuse PRBCs for hemoglobin less than 7. 5.  Plans for salvage chemotherapy will depend his recovery from the sepsis and improvement in the liver enzymes.   LOS: 2 days   Mikey Bussing, DNP, AGPCNP-BC, AOCNP 10/13/19 Mr. Leverich was interviewed and examined.  He appears stable.  The fever has improved with drainage of the abscess and antibiotics.  Repeat blood cultures are pending. The severe anemia is secondary to malnutrition, chronic disease, and multiple  blood draws.  I agree with the red cell transfusion.  I reviewed CT images with interventional radiology again today.  They will attempt a percutaneous cholangiogram to look for a drainable obstruction.  Mr. Vantine understands he will not be a candidate for salvage gemcitabine/Abraxane unless the hyperbilirubinemia improves.  Hold anticoagulation therapy until safe to resume following the interventional radiology procedures.  Please call oncology as needed over the weekend.  I will check on him 10/16/2019.

## 2019-10-14 LAB — CULTURE, BLOOD (ROUTINE X 2)
Special Requests: ADEQUATE
Special Requests: ADEQUATE

## 2019-10-14 LAB — COMPREHENSIVE METABOLIC PANEL
ALT: 100 U/L — ABNORMAL HIGH (ref 0–44)
AST: 81 U/L — ABNORMAL HIGH (ref 15–41)
Albumin: 1.5 g/dL — ABNORMAL LOW (ref 3.5–5.0)
Alkaline Phosphatase: 615 U/L — ABNORMAL HIGH (ref 38–126)
Anion gap: 8 (ref 5–15)
BUN: 11 mg/dL (ref 6–20)
CO2: 21 mmol/L — ABNORMAL LOW (ref 22–32)
Calcium: 7.7 mg/dL — ABNORMAL LOW (ref 8.9–10.3)
Chloride: 103 mmol/L (ref 98–111)
Creatinine, Ser: 0.72 mg/dL (ref 0.61–1.24)
GFR calc Af Amer: >60 mL/min (ref >60)
GFR calc non Af Amer: >60 mL/min (ref >60)
Glucose, Bld: 221 mg/dL — ABNORMAL HIGH (ref 70–99)
Potassium: 4 mmol/L (ref 3.5–5.1)
Sodium: 132 mmol/L — ABNORMAL LOW (ref 135–145)
Total Bilirubin: 8.2 mg/dL — ABNORMAL HIGH (ref 0.3–1.2)
Total Protein: 5.4 g/dL — ABNORMAL LOW (ref 6.5–8.1)

## 2019-10-14 LAB — PHOSPHORUS: Phosphorus: 2.4 mg/dL — ABNORMAL LOW (ref 2.5–4.6)

## 2019-10-14 LAB — GLUCOSE, CAPILLARY
Glucose-Capillary: 189 mg/dL — ABNORMAL HIGH (ref 70–99)
Glucose-Capillary: 307 mg/dL — ABNORMAL HIGH (ref 70–99)
Glucose-Capillary: 314 mg/dL — ABNORMAL HIGH (ref 70–99)
Glucose-Capillary: 339 mg/dL — ABNORMAL HIGH (ref 70–99)

## 2019-10-14 LAB — CBC
HCT: 25.2 % — ABNORMAL LOW (ref 39.0–52.0)
Hemoglobin: 8.2 g/dL — ABNORMAL LOW (ref 13.0–17.0)
MCH: 27.9 pg (ref 26.0–34.0)
MCHC: 32.5 g/dL (ref 30.0–36.0)
MCV: 85.7 fL (ref 80.0–100.0)
Platelets: 192 10*3/uL (ref 150–400)
RBC: 2.94 MIL/uL — ABNORMAL LOW (ref 4.22–5.81)
RDW: 19.9 % — ABNORMAL HIGH (ref 11.5–15.5)
WBC: 11.4 10*3/uL — ABNORMAL HIGH (ref 4.0–10.5)
nRBC: 0 % (ref 0.0–0.2)

## 2019-10-14 LAB — MAGNESIUM: Magnesium: 1.5 mg/dL — ABNORMAL LOW (ref 1.7–2.4)

## 2019-10-14 MED ORDER — MAGNESIUM SULFATE IN D5W 1-5 GM/100ML-% IV SOLN
1.0000 g | Freq: Once | INTRAVENOUS | Status: AC
  Administered 2019-10-14: 1 g via INTRAVENOUS
  Filled 2019-10-14: qty 100

## 2019-10-14 MED ORDER — LORAZEPAM 0.5 MG PO TABS
0.5000 mg | ORAL_TABLET | Freq: Once | ORAL | Status: AC
  Administered 2019-10-14: 0.5 mg via ORAL
  Filled 2019-10-14: qty 1

## 2019-10-14 MED ORDER — SODIUM CHLORIDE 0.9% FLUSH
5.0000 mL | Freq: Three times a day (TID) | INTRAVENOUS | Status: DC
  Administered 2019-10-14 – 2019-10-15 (×5): 5 mL

## 2019-10-14 MED ORDER — K PHOS MONO-SOD PHOS DI & MONO 155-852-130 MG PO TABS
250.0000 mg | ORAL_TABLET | Freq: Three times a day (TID) | ORAL | Status: AC
  Administered 2019-10-14 – 2019-10-15 (×4): 250 mg via ORAL
  Filled 2019-10-14 (×4): qty 1

## 2019-10-14 NOTE — Progress Notes (Signed)
PT Cancellation Note  Patient Details Name: Brian Torres MRN: [id number] DOB: 1961/07/14   Cancelled Treatment:    Reason Eval/Treat Not Completed: PT screened, no needs identified, will sign off Pt politely declined.  He has had therapy services before and declines any PT or OT needs at this time.  PT to sign off.   Calan Doren,KATHrine E 10/14/2019, 9:55 AM Jannette Spanner PT, DPT [facility] Office: [phone]

## 2019-10-14 NOTE — Progress Notes (Signed)
Referring Physician(s): DR Darrold Junker  Supervising Physician: Corrie Mckusick  Patient Status:  [facility] - In-pt  Chief Complaint:  Metastatic pancreatic cancer, right upper abdominal/subhepatic abscess, jaundice  Subjective:  RUQ drain placed in IR 12/10 PTC/Biliary drain placed in IR 12/11  Pt lying on left side in bed He feels better this way No real complaints-- just tired /weak No real pain  OP good for both drains PTC bilious appearing RUQ drain more brown   Allergies: Patient has no known allergies.  Medications: Prior to Admission medications   Medication Sig Start Date End Date Taking? Authorizing Provider  acetaminophen (TYLENOL) 500 MG tablet Take 1,000 mg by mouth every 6 (six) hours as needed for moderate pain or headache.   Yes [provider]  apixaban (ELIQUIS) 5 MG TABS tablet Take 1 tablet (5 mg total) by mouth 2 (two) times daily. 01/04/19  Yes Arta Silence, MD  b complex vitamins tablet Take 1 tablet by mouth daily.   Yes [provider]  CINNAMON PO Take 1,000 mg by mouth 2 (two) times daily.   Yes [provider]  Coenzyme Q10 (COQ-10 PO) Take 1 tablet by mouth daily.   Yes [provider]  CREON 36000 units CPEP capsule Take by mouth. Takes #2 with meals and #1 with snack 09/30/19  Yes [provider]  famotidine (PEPCID AC) 10 MG tablet Take 10 mg by mouth 2 (two) times daily as needed for heartburn or indigestion.   Yes [provider]  ferrous sulfate 325 (65 FE) MG EC tablet Take 325 mg by mouth 2 (two) times a day.    Yes [provider]  glimepiride (AMARYL) 4 MG tablet Take 4 mg by mouth daily with breakfast.  10/13/17  Yes [provider]  HUMALOG KWIKPEN 100 UNIT/ML KwikPen Inject 3-13 Units into the skin 2 (two) times daily as needed (if bs is over 150).  06/29/19  Yes [provider]  Insulin Glargine (BASAGLAR KWIKPEN) 100 UNIT/ML SOPN Inject 19 Units into the  skin at bedtime.  10/19/18  Yes [provider]  Lidocaine (HM LIDOCAINE PATCH) 4 % PTCH Apply 1 patch topically at bedtime.   Yes [provider]  lidocaine-prilocaine (EMLA) cream Apply 1 application topically as needed (port access). 10/09/19  Yes Ladell Pier, MD  lisinopril (PRINIVIL,ZESTRIL) 10 MG tablet Take 10 mg by mouth daily. 10/13/17  Yes [provider]  LORazepam (ATIVAN) 0.5 MG tablet Take 1 tablet (0.5 mg total) by mouth every 8 (eight) hours as needed (for nausea). 05/01/19  Yes Ladell Pier, MD  Melatonin 10 MG TABS Take 20 mg by mouth at bedtime.   Yes [provider]  metFORMIN (GLUCOPHAGE) 1000 MG tablet Take 1,000 mg by mouth 2 (two) times daily. 10/13/17  Yes [provider]  methocarbamol (ROBAXIN) 500 MG tablet Take 1 tablet (500 mg total) by mouth every 6 (six) hours as needed for muscle spasms. 07/28/19  Yes [name], MD  Omega-3 Fatty Acids (FISH OIL) 1000 MG CAPS Take 1,000 mg by mouth 2 (two) times daily.    Yes [provider]  ondansetron (ZOFRAN-ODT) 4 MG disintegrating tablet Take 1 tablet (4 mg total) by mouth every 6 (six) hours as needed for nausea. 10/02/19  Yes Ladell Pier, MD  oxyCODONE (OXY IR/ROXICODONE) 5 MG immediate release tablet Take 1 tablet (5 mg total) by mouth every 6 (six) hours as needed for moderate pain, severe pain  or breakthrough pain. 10/02/19  Yes Ladell Pier, MD  pioglitazone (ACTOS) 45 MG tablet Take 45 mg by mouth daily. 10/13/17  Yes [provider]  prochlorperazine (COMPAZINE) 10 MG tablet Take 1 tablet (10 mg total) by mouth every 6 (six) hours as needed for nausea or vomiting. 04/14/19  Yes Ladell Pier, MD     Vital Signs: BP 130/76 (BP Location: Left Arm)   Pulse 95   Temp 99.5 F (37.5 C)   Resp 18   Ht _0  (1.93 m)   Wt 193 lb 2 oz (87.6 kg)   SpO2 99%   BMI 23.51 kg/m   Physical Exam Skin:    General: Skin is warm and dry.      Comments: Sites of drains are clean and dry Flushing well per RN No pain No bleeding  OP bilious for PTC drain 120 cc yesterday   More brown color for RUQ drain 10 cc yesterday Culture ABUNDANT STREPTOCOCCUS GROUP F  FEW GRAM NEGATIVE RODS  IDENTIFICATION AND SUSCEPTIBILITIES TO FOLLOW  Performed at [facility] [address] [place], [place] [address]  Report Status PENDING  Organism ID, Bacteria STREPTOCOCCUS GROUP F      Neurological:     Mental Status: He is alert.     Imaging: CT Angio Chest PE W and/or Wo Contrast  Result Date: 10/11/2019 CLINICAL DATA:  77m [id number] Pt had whipple procedure done in sept 2020. Pt was to start chemo today. Bilirubin level is going up. Denies any chest pain or chest pressure. Pt tates has a rapid heart rate. eval for pE EXAM: CT ANGIOGRAPHY CHEST WITH CONTRAST TECHNIQUE: Multidetector CT imaging of the chest was performed using the standard protocol during bolus administration of intravenous contrast. Multiplanar CT image reconstructions and MIPs were obtained to evaluate the vascular anatomy. CONTRAST:  742mOMNIPAQUE IOHEXOL 350 MG/ML SOLN COMPARISON:  CT abdomen pelvis 10/10/2019 FINDINGS: Cardiovascular: No filling defects within the pulmonary arteries to suggest acute pulmonary embolism. No acute findings of the aorta or great vessels. No pericardial fluid. Mediastinum/Nodes: No axillary supraclavicular adenopathy. No mediastinal hilar adenopathy. No pericardial effusion. Port in the anterior chest wall with tip in distal SVC. Lungs/Pleura: No suspicious pulmonary nodules. No infiltrate Upper Abdomen: Innumerable hepatic metastasis again noted. Musculoskeletal: No aggressive osseous lesion Review of the MIP images confirms the above findings. IMPRESSION: 1. No evidence acute pulmonary embolism. 2. No acute pulmonary parenchymal findings. 3. Innumerable hepatic metastasis. Electronically Signed   By: StSuzy Bouchard.D.   On: 10/11/2019  09:50   CT ABDOMEN PELVIS W CONTRAST  Result Date: 10/13/2019 CLINICAL DATA:  [age]ear old with history of metastatic pancreatic cancer and status post Whipple. History of intra-abdominal abscess and recent percutaneous drain placement in a perihepatic abscess on 10/12/2019. EXAM: CT ABDOMEN AND PELVIS WITH CONTRAST TECHNIQUE: Multidetector CT imaging of the abdomen and pelvis was performed using the standard protocol following bolus administration of intravenous contrast. CONTRAST:  1007mMNIPAQUE IOHEXOL 300 MG/ML  SOLN COMPARISON:  CT 10/10/2019 FINDINGS: Lower chest: Again noted is a poorly defined pleural-based density in the right lower lobe on sequence 6, image 14. Dependent densities are most compatible with atelectasis. Interval development of trace bilateral pleural effusions. Hepatobiliary: Again noted are innumerable hypodensities throughout the liver compatible with metastatic disease. Index lesion near the central aspect of the liver measures up to 2.9 cm. Small amount of perihepatic ascites has increased from the recent examination. Postsurgical changes compatible with Whipple  procedure and hepatojejunostomy. Poorly defined structures in the central aspect of the liver related to the surgical anastomosis and liver lesions in this area. Evidence for areas of segmental intrahepatic biliary dilatation. Persistent intrahepatic biliary dilatation in the right hepatic lobe on sequence 2, image 29. Limited evaluation of the central biliary structures due to the prior surgery. Pancreas: Post Whipple.  Remaining pancreas is not well visualized. Spleen: Stable appearance of the spleen. Increased perisplenic ascites. Adrenals/Urinary Tract: Normal appearance of both adrenal glands. Negative for hydronephrosis. Evidence for a cortical cyst in the right kidney upper pole. No suspicious renal lesions. Mild distention of the urinary bladder. Stomach/Bowel: Colonic diverticula. Previous examination demonstrated  abnormal tissue or soft tissue thickening near the hepatic flexure of the colon but this area is poorly characterized without oral contrast. No evidence for a bowel obstruction. Evidence for a gastrojejunostomy related to the Whipple procedure. Vascular/Lymphatic: Atherosclerotic calcifications in the abdominal aorta without aneurysm. There are probably abnormal lymph nodes in the central upper abdomen and porta hepatis region but poorly characterized on this examination. There may be narrowing in the main portal vein on sequence 2, image 38 which could be secondary to lymphadenopathy. Main portal vein is poorly characterized on this examination but portal vein is patent. Reproductive: Stable appearance of the prostate. Other: Small amount ascites in the pelvis is new. Slightly increased ascites in the abdomen with some mesenteric edema. The perihepatic fluid collection from the previous examination has resolved with the percutaneous drain. Percutaneous drain is positioned the perihepatic space and similar to the placement images. Prior examination raised concern for tumor or abscess collection near the hepatic flexure of the colon. There is no longer a definite fluid collection adjacent to the hepatic flexure. There may be a small connection between the percutaneous drain and where the previous hepatic flexure collection was located, suggesting this hepatic flexure collection has been decompressed with the percutaneous drain. No new or additional intra-abdominal abscess collections. Musculoskeletal: No acute bone abnormality. IMPRESSION: 1. Perihepatic abscess collection has resolved with the percutaneous drain. In addition, the collection adjacent to the hepatic flexure of the colon has either resolved or decompressed in the interim and probably related to the percutaneous drain. However, the hepatic flexure area is poorly characterized on this examination. 2. Innumerable liver lesions compatible with metastatic  disease. In addition, there are areas of intrahepatic biliary dilatation likely related to obstruction from liver lesions. Biliary dilatation is likely segmental and scattered throughout the liver. 3. Increased ascites in the abdomen and pelvis. Overall ascites volume is small. Trace pleural fluid has developed. Electronically Signed   By: Markus Daft M.D.   On: 10/13/2019 12:37   CT Abdomen Pelvis W Contrast  Result Date: 10/10/2019 CLINICAL DATA:  58 year old male with history of pancreatic cancer diagnosed in February 2020. Elevated bilirubin. Evaluate for potential biliary obstruction. EXAM: CT ABDOMEN AND PELVIS WITH CONTRAST TECHNIQUE: Multidetector CT imaging of the abdomen and pelvis was performed using the standard protocol following bolus administration of intravenous contrast. CONTRAST:  117m OMNIPAQUE IOHEXOL 300 MG/ML  SOLN COMPARISON:  CT the abdomen and pelvis 08/24/2019. Abdominal MRI 09/05/2019. FINDINGS: Lower chest: Unremarkable. Hepatobiliary: Marked increase in number and size of what are now innumerable low-intermediate attenuation lesions scattered throughout the hepatic parenchyma, largest of which measure up to 3.6 x 2.4 cm in segment 4B of the liver (axial image 37 of series 3). Some of these lesions appear elongated within the hepatic parenchyma best appreciated on sagittal image 53 of  series 7, where that length of the lesion previously described measures up to 11.6 cm. These lesions are exerting local mass effect upon adjacent structures causing compression of central bile ducts resulting in mild to moderate intrahepatic biliary ductal dilatation throughout the right lobe of the liver. Status post cholecystectomy is part of Whipple procedure. In the gallbladder fossa there is a large amount of intermediate attenuation fluid and/or soft tissue which has a thick rim of peripheral enhancement estimated to measure approximately 6.1 x 6.6 x 3.5 cm (axial image 43 of series 3 and coronal  image 33 of series 6). This soft tissue appears intimately associated both with the undersurface of the liver, as well as several adjacent small bowel loops, in close proximity to the presumed location of the hepaticojejunostomy. Previously noted fluid collection beneath the right lobe of the liver (which previously had a percutaneous drainage catheter on prior CT 08/24/2019) is again noted on today's examination, currently measuring 5.7 x 5.3 x 6.9 cm (axial image 51 of series 3 and coronal image 34 of series 6) with thick peripheral rim of soft tissue and enhancement which appears to partially extend into the right oblique musculature best appreciated on axial image 47 of series 3 and coronal image 33 of series 6). Pancreas: Postoperative changes of Whipple procedure. Spleen: Unremarkable. Adrenals/Urinary Tract: 1.5 cm low-attenuation lesion in the upper pole of the right kidney, compatible with a simple cyst. Left kidney and bilateral adrenal glands are normal in appearance. No hydroureteronephrosis. Urinary bladder is normal in appearance. Stomach/Bowel: Postoperative changes of Whipple procedure. Loops of small bowel and colon traversing beneath the right lobe of the liver appear thickened as they come in close association with the previously noted fluid and/or soft tissue collection in this region. No pathologic dilatation of small bowel or colon. Normal appendix. Vascular/Lymphatic: Aortic atherosclerosis, without evidence of aneurysm or dissection in the abdominal or pelvic vasculature. Small of filling defect in the proximal portal vein (axial image 36 of series 3), concerning for nonobstructive thrombus. Enlarged lymph node immediately anterior to the superior mesenteric vein (axial image 43 of series 3) measuring 1.5 cm in short axis. Reproductive: Prostate gland and seminal vesicles are unremarkable in appearance. Other: Trace volume of perihepatic ascites.  No pneumoperitoneum. Musculoskeletal: There  are no aggressive appearing lytic or blastic lesions noted in the visualized portions of the skeleton. IMPRESSION: 1. Marked increased number and size of innumerable hepatic lesions, as well as persistence of right infra hepatic postoperative fluid collection and development of an additional fluid and/or soft tissue collection beneath the gallbladder fossa. Overall, the findings are favored to reflect progressive metastatic disease to the liver, however, there is overlap with potential infectious etiologies (multiple intrahepatic abscesses and subhepatic abscesses). Guided sampling of one or more of these lesions is suggested to establish a definitive diagnosis. 2. 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 3. Interval development of mildly enlarged mesenteric lymph node measuring 1.5 cm in short axis immediately anterior to the portal vein, suspicious for a metastatic lymph node, although reactive lymphadenopathy in the setting of extensive infection is not excluded. 4. Additional incidental findings, as above. Electronically Signed   By: Vinnie Langton M.D.   On: 10/10/2019 17:34   DG Chest Port 1 View  Result Date: 10/11/2019 CLINICAL DATA:  [place]. Additional history provided: Syncopal episode, history of liver CA. EXAM: PORTABLE CHEST 1 VIEW COMPARISON:  CT angiogram chest 08/07/2019, chest radiograph 08/07/2011 FINDINGS: Left chest infusion port catheter with tip  projecting in the region of the superior cavoatrial junction. Heart size within normal limits. No airspace consolidation within the lungs. No evidence of pleural effusion or pneumothorax. No acute bony abnormality. IMPRESSION: No evidence of acute cardiopulmonary abnormality. Electronically Signed   By: Kellie Simmering DO   On: 10/11/2019 07:57   IR INT EXT BILIARY DRAIN WITH CHOLANGIOGRAM  Result Date: 10/13/2019 INDICATION: Metastatic pancreatic carcinoma status post Whipple. Worsening bilirubin with evidence of intrahepatic  biliary ductal dilatation on recent cross-sectional imaging. EXAM: PERCUTANEOUS TRANSHEPATIC CHOLANGIOGRAM PERCUTANEOUS INTERNAL/EXTERNAL BILIARY DRAIN CATHETER PLACEMENT MEDICATIONS: Patient was already receiving adequate prophylactic antibiotic coverage as an inpatient. ANESTHESIA/SEDATION: Intravenous Fentanyl 144mg and Versed 215mwere administered as conscious sedation during continuous monitoring of the patient's level of consciousness and physiological / cardiorespiratory status by the radiology RN, with a total moderate sedation time of 23 minutes. PROCEDURE: Informed written consent was obtained from the patient after a thorough discussion of the procedural risks, benefits and alternatives. All questions were addressed. Maximal Sterile Barrier Technique was utilized including caps, mask, sterile gowns, sterile gloves, sterile drape, hand hygiene and skin antiseptic. A timeout was performed prior to the initiation of the procedure. Dilated intrahepatic biliary tree was identified on ultrasound and a peripheral right lower lobe duct was selected for approach. Skin and subcutaneous tissues down to the liver capsule infiltrated with 1% lidocaine. Under real-time ultrasound guidance, a 21 gauge trocar needle was advanced into the duct. 018 guidewire advanced centrally easily. Micro dilator was placed over the wire. Small contrast injection under fluoroscopy confirmed appropriate positioning. Micro dilator was exchanged over the 018 guidewire for a 5 Fr[place]ropuncture dilator set. Contrast injection for percutaneous transhepatic cholangiogram was performed. Catheter exchanged over an angled Glidewire for a 5 French Kumpe catheter, advanced across the choledochojejunostomy. The Kumpe was exchanged over an Amplatz wire for vascular dilator which facilitated placement of a 10 French internal/external biliary drain catheter, placed with sideholes spanning the central obstruction, across the CBD and into the  choledochojejunostomy. Confirmatory contrast injection under fluoroscopy performed. The catheter was then flushed with saline and secured externally with 0 Prolene suture and StatLock, placed to external drain bag to maximize biliary decompression. The patient tolerated the procedure well. FLUOROSCOPY TIME:  2 minutes 30 seconds; 11951Gy COMPLICATIONS: None immediate. FINDINGS: The percutaneous transhepatic cholangiogram confirms dilated intrahepatic right biliary tree . Multiple segments of tapered narrowing near the biliary confluence with the CBD. The CBD is decompressed. The choledochojejunostomy is widely patent. 10 French internal/external biliary drainage catheter placed as above. IMPRESSION: 1. Multiple central regions of tapered intrahepatic biliary ductal stenosis likely secondary to the patient's known extensive hepatic metastatic disease. 2. Technically successful internal/external biliary drain catheter placement. Electronically Signed   By: D Lucrezia Europe.D.   On: 10/13/2019 16:47   CT IMAGE GUIDED DRAINAGE BY PERCUTANEOUS CATHETER  Result Date: 10/12/2019 INDICATION: [age]ear old male with a history of right upper quadrant abscess EXAM: CT GUIDED DRAINAGE OF  ABSCESS MEDICATIONS: The patient is currently admitted to the hospital and receiving intravenous antibiotics. The antibiotics were administered within an appropriate time frame prior to the initiation of the procedure. ANESTHESIA/SEDATION: 2.0 mg IV Versed 100 mcg IV Fentanyl Moderate Sedation Time:  12 minutes The patient was continuously monitored during the procedure by the interventional radiology nurse under my direct supervision. COMPLICATIONS: None TECHNIQUE: Informed written consent was obtained from the patient after a thorough discussion of the procedural risks, benefits and alternatives. All questions were addressed. Maximal Sterile Barrier Technique  was utilized including caps, mask, sterile gowns, sterile gloves, sterile drape,  hand hygiene and skin antiseptic. A timeout was performed prior to the initiation of the procedure. PROCEDURE: The operative field was prepped with Chlorhexidine in a sterile fashion, and a sterile drape was applied covering the operative field. A sterile gown and sterile gloves were used for the procedure. Local anesthesia was provided with 1% Lidocaine. Scout CT was acquired. The patient was prepped and draped in usual sterile fashion. 1% lidocaine was used for local anesthesia. Using CT guidance, guide needle was advanced into the abscess of the right upper quadrant. Modified Seldinger technique was used to place a 10 [place] drain. 30 cc of purulent material aspirated for a culture. Drain was attached to bulb suction and sutured in position. Patient tolerated the procedure well and remained hemodynamically stable throughout. No complications were encountered and no significant blood loss. FINDINGS: Scout CT demonstrates abscess of the right upper quadrant, sub a pathic. Final CT demonstrates drainage catheter within the abscess. Culture of 30 cc purulent material sent to the lab. IMPRESSION: Status post CT-guided drainage of right upper quadrant abscess. Signed, Dulcy Fanny. Dellia Nims, RPVI [facility] Radiology Electronically Signed   By: Corrie Mckusick D.O.   On: 10/12/2019 09:37    Labs:  CBC: Recent Labs    10/11/19 0710 10/12/19 0445 10/13/19 0400 10/13/19 1758 10/14/19 0358  WBC 3.1* 10.1 10.8*  --  11.4*  HGB 12.9* 7.2* 6.6* 7.5* 8.2*  HCT 39.8 22.3* 20.4* 22.9* 25.2*  PLT 169 181 196  --  192    COAGS: Recent Labs    12/22/18 0159 12/23/18 0014 09/26/19 1205 10/11/19 0710 10/12/19 0445 10/13/19 0831  INR  --   --  1.2 1.9* 2.1* 2.3*  APTT 80* 65* 53* 36  --   --     BMP: Recent Labs    10/11/19 0710 10/12/19 0445 10/13/19 0400 10/14/19 0358  NA 130* 133* 133* 132*  K 4.1 4.1 3.9 4.0  CL 96* 105 105 103  CO2 21* 17* 19*  21*  GLUCOSE 170* 106* 173* 221*  BUN 32* 33* 19 11  CALCIUM 8.3* 7.5* 7.6* 7.7*  CREATININE 1.30* 1.15 0.94 0.72  GFRNONAA 60* >60 >60 >60  GFRAA >60 >60 >60 >60    LIVER FUNCTION TESTS: Recent Labs    10/11/19 0710 10/12/19 0445 10/13/19 0400 10/14/19 0358  BILITOT 6.8* 6.2* 6.4* 8.2*  AST 98* 551* 225* 81*  ALT 72* 184* 146* 100*  ALKPHOS 894* 747* 660* 615*  PROT 6.7 6.0* 5.5* 5.4*  ALBUMIN 2.1* 1.7* 1.5* 1.5*    Assessment and Plan:  RUQ abscess drain PTC/Biliary drain Will follow Draining well  Electronically Signed: Lavonia Drafts, PA-C 10/14/2019, 11:37 AM   I spent a total of 15 Minutes at the the patient's bedside AND on the patient's hospital floor or unit, greater than 50% of which was counseling/coordinating care for RUQ absc drain and Biliary drain

## 2019-10-14 NOTE — Progress Notes (Signed)
PROGRESS NOTE    Brian Torres  JTT:[id number] DOB: 18-Dec-1960 DOA: 10/11/2019 PCP: Cari Caraway, MD    Brief Narrative:  Patient is a 58-year male with history of metastatic pancreatic cancer, diabetes type 2, hyperlipidemia, hypertension, depression, history of DVT on Eliquis who presented from home with complaints of fever, chills.  He has history of metastatic pancreatic cancer with liver mets and underwent Whipple's procedure recently. He was also admitted on October and was found to have intraabdominal  abscesses . On this admission, it was reported that he was gradually declining over the past 2 weeks.  Developed fever, chills, diarrhea, abdominal discomfort.  He was hypotensive on arrival.  CT abdomen/pelvis done in the emergency department showed numerous liver metastasis, possible abscesses.  Started on broad-spectrum antibiotics.  IR consulted and he underwent IR guided drainage placement. Now blood cultures growing streptococcal species.ID consulted, suggest He will need a prolonged course of antibiotics but oral therapy with drainage will likely be adequate, depending on the organism sensitivities.     Assessment & Plan:   Active Problems:   Sepsis ([place])   AKI (acute kidney injury) ([place])  # Sepsis/gram-positive bacteremia: Presented with fever, tachycardia, tachypnea, elevated lactic acid level. Currently hemodynamically stable.  Sepsis secondary to liver abscess.  Blood cultures showing Streptococcus species. Antibiotics narrowed to ceftriaxone.  Will follow final culture and sensitivity.  ID consulted  Suggest  keep him on IV therapy while inpatient. CT of abdomen today shows resolution of the fluid collection/abscess.  Transition to oral Augmentin at discharge for another 7 days to assure complete resolution with bacteremia.      Liver abscess: Underwent CT-guided right upper quadrant drain placement for abscess on 10/12/19.  CT abdomen showed numerous liver mets and  abscesses.  Continue current antibiotics.  LFTs improving.  S/p PTC biliary drain placed yesterday.  He was hospitalized from 08/07/2019 through 08/11/2019 with postsurgical intra-abdominal fluid collection.  CT at that time showed 2 rim-enhancing fluid collection in the right upper quadrant concerning for abscess, colitis.  Blood cultures had shown Prevotella.  Underwent CT-guided aspiration of intra-abdominal fluid collection and placement of drain  on 08/09/2019.  He was discharged on Augmentin at that time.  IR will remove the drain.  Metastatic pancreatic adenocarcinoma: Follows with Dr. Learta Codding.  I stage IV cancer.Marland Kitchen  Has worsening LFTs.  Has metastatic liver disease.  As per oncology, there was plan to begin salvage chemotherapy with gemcitabine/Abraxane on 10/09/2019.  Chemotherapy held due to new hyperbilirubinemia. History of Whipple's procedure on 07/10/19.  Has abdominal wound that is healing. Monitor CMP  AKI: Improved with IV fluids.  Anemia could be chronic sec.to pancreatic cancer: s/p 2 PRBC,  post transfusion H/H stable. No obvious source/cause without evidence, hemodynamic instability.  Diabetes mellitus 2: Continue current insulin regimen.  On antihyperglycemics at home.  Hypertension: Hypotensive on arrival.  Antihypertensives held.  History of DVT: On Eliquis.  Keep on hold given Anemia   DVT prophylaxis:  SCDS, Eliquis on hold.  Code Status: Full Family Communication:  D/W patient Disposition Plan: Home after full workup Consultants:    ID  Oncology   Procedures:  Right upper quadrant drain.  Antimicrobials:  Anti-infectives (From admission, onward)   Start     Dose/Rate Route Frequency Ordered Stop   10/12/19 2000  cefTRIAXone (ROCEPHIN) 2 g in sodium chloride 0.9 % 100 mL IVPB     2 g 200 mL/hr over 30 Minutes Intravenous Every 24 hours 10/12/19 1554  10/12/19 1800  piperacillin-tazobactam (ZOSYN) IVPB 3.375 g  Status:  Discontinued     3.375  g 12.5 mL/hr over 240 Minutes Intravenous Every 8 hours 10/12/19 1348 10/12/19 1554   10/11/19 2000  vancomycin (VANCOCIN) IVPB 1000 mg/200 mL premix  Status:  Discontinued     1,000 mg 200 mL/hr over 60 Minutes Intravenous Every 12 hours 10/11/19 1242 10/12/19 1348   10/11/19 1600  ceFEPIme (MAXIPIME) 2 g in sodium chloride 0.9 % 100 mL IVPB  Status:  Discontinued     2 g 200 mL/hr over 30 Minutes Intravenous Every 8 hours 10/11/19 1242 10/12/19 1348   10/11/19 1400  metroNIDAZOLE (FLAGYL) IVPB 500 mg  Status:  Discontinued     500 mg 100 mL/hr over 60 Minutes Intravenous Every 8 hours 10/11/19 1206 10/12/19 1348   10/11/19 0730  vancomycin (VANCOCIN) 1,500 mg in sodium chloride 0.9 % 500 mL IVPB     1,500 mg 250 mL/hr over 120 Minutes Intravenous  Once 10/11/19 0716 10/11/19 1032   10/11/19 0715  ceFEPIme (MAXIPIME) 2 g in sodium chloride 0.9 % 100 mL IVPB     2 g 200 mL/hr over 30 Minutes Intravenous  Once 10/11/19 0714 10/11/19 0758   10/11/19 0715  metroNIDAZOLE (FLAGYL) IVPB 500 mg     500 mg 100 mL/hr over 60 Minutes Intravenous  Once 10/11/19 0714 10/11/19 0912   10/11/19 0715  vancomycin (VANCOCIN) IVPB 1000 mg/200 mL premix  Status:  Discontinued     1,000 mg 200 mL/hr over 60 Minutes Intravenous  Once 10/11/19 8270 10/11/19 0716      Subjective: Patient was seen and examined at bedside, He is hemodynamically stable, complains of abdominal pain when he lies on the right side.  He is lying mainly on left side.  Denies nausea vomiting diarrhea.   Objective: Vitals:   10/13/19 1638 10/13/19 2049 10/14/19 0433 10/14/19 1306  BP: 122/77 132/74 130/76 128/74  Pulse: 76 95 95 98  Resp: _0 Temp:  99.5 F (37.5 C) 99.5 F (37.5 C) 98.2 F (36.8 C)  TempSrc:    Oral  SpO2: 97% 100% 99% 100%  Weight:      Height:        Intake/Output Summary (Last 24 hours) at 10/14/2019 1348 Last data filed at 10/14/2019 1336 Gross per 24 hour  Intake 2858.44 ml  Output  2255 ml  Net 603.44 ml   Filed Weights   10/12/19 1506  Weight: 87.6 kg    Examination:  General exam: Appears calm and comfortable, NAD Respiratory system: Clear to auscultation. Respiratory effort normal. Cardiovascular system: S1 & S2 heard, RRR. No JVD, murmurs, rubs, gallops or clicks. No pedal edema. Gastrointestinal system: Abdomen is nondistended, soft and nontender. No organomegaly or masses felt. Normal bowel sounds heard. There is drain with pinkish drainage. PTC drainage noted with orange colored fluid. Central nervous system: Alert and oriented. No focal neurological deficits. Extremities:  No pedal edema, no swelling. Skin: No rashes, lesions or ulcers Psychiatry: Judgement and insight appear normal. Mood & affect appropriate.     Data Reviewed: I have personally reviewed following labs and imaging studies  CBC: Recent Labs  Lab 10/09/19 1024 10/11/19 0710 10/12/19 0445 10/13/19 0400 10/13/19 1758 10/14/19 0358  WBC 13.0* 3.1* 10.1 10.8*  --  11.4*  NEUTROABS 10.5* 2.8  --  8.6*  --   --   HGB 8.8* 12.9* 7.2* 6.6* 7.5* 8.2*  HCT 26.6* 39.8 22.3*  20.4* 22.9* 25.2*  MCV 87.2 87.3 89.2 88.7  --  85.7  PLT 268 169 181 196  --  025   Basic Metabolic Panel: Recent Labs  Lab 10/09/19 1024 10/11/19 0710 10/12/19 0445 10/13/19 0400 10/14/19 0358  NA 131* 130* 133* 133* 132*  K 5.1 4.1 4.1 3.9 4.0  CL 96* 96* 105 105 103  CO2 25 21* 17* 19* 21*  GLUCOSE 245* 170* 106* 173* 221*  BUN 21* 32* 33* 19 11  CREATININE 1.09 1.30* 1.15 0.94 0.72  CALCIUM 8.7* 8.3* 7.5* 7.6* 7.7*  MG  --   --   --   --  1.5*  PHOS  --   --   --   --  2.4*   GFR: Estimated Creatinine Clearance: 122.1 mL/min (by C-G formula based on SCr of 0.72 mg/dL). Liver Function Tests: Recent Labs  Lab 10/09/19 1024 10/11/19 0710 10/12/19 0445 10/13/19 0400 10/14/19 0358  AST 120* 98* 551* 225* 81*  ALT 81* 72* 184* 146* 100*  ALKPHOS 1,292* 894* 747* 660* 615*  BILITOT 5.0* 6.8*  6.2* 6.4* 8.2*  PROT 7.3 6.7 6.0* 5.5* 5.4*  ALBUMIN 2.2* 2.1* 1.7* 1.5* 1.5*   Recent Labs  Lab 10/11/19 0710  LIPASE 14   Recent Labs  Lab 10/11/19 0721  AMMONIA 40*   Coagulation Profile: Recent Labs  Lab 10/11/19 0710 10/12/19 0445 10/13/19 0831  INR 1.9* 2.1* 2.3*   Cardiac Enzymes: No results for input(s): CKTOTAL, CKMB, CKMBINDEX, TROPONINI in the last 168 hours. BNP (last 3 results) No results for input(s): PROBNP in the last 8760 hours. HbA1C: Recent Labs    10/11/19 1700  HGBA1C 8.4*   CBG: Recent Labs  Lab 10/13/19 1147 10/13/19 1647 10/13/19 2048 10/14/19 0750 10/14/19 1140  GLUCAP 133* 116* 165* 189* 307*   Lipid Profile: No results for input(s): CHOL, HDL, LDLCALC, TRIG, CHOLHDL, LDLDIRECT in the last 72 hours. Thyroid Function Tests: No results for input(s): TSH, T4TOTAL, FREET4, T3FREE, THYROIDAB in the last 72 hours. Anemia Panel: No results for input(s): VITAMINB12, FOLATE, FERRITIN, TIBC, IRON, RETICCTPCT in the last 72 hours. Sepsis Labs: Recent Labs  Lab 10/11/19 0710 10/11/19 0910  LATICACIDVEN 5.2* 3.1*    Recent Results (from the past 240 hour(s))  Blood Culture (routine x 2)     Status: Abnormal   Collection Time: 10/11/19  7:10 AM   Specimen: BLOOD  Result Value Ref Range Status   Specimen Description   Final    BLOOD PORTA CATH Performed at [facility] [address] [place], [place] [address]    Special Requests   Final    BOTTLES DRAWN AEROBIC AND ANAEROBIC Blood Culture adequate volume Performed at [facility] [address] [place], [place] [address]    Culture  Setup Time   Final    GRAM POSITIVE COCCI IN CHAINS CRITICAL RESULT CALLED TO, READ BACK BY AND VERIFIED WITH: B GREEN PHARMD 10/12/19 0629 JDW IN BOTH AEROBIC AND ANAEROBIC BOTTLES    Culture (A)  Final    STREPTOCOCCUS GROUP F SUSCEPTIBILITIES PERFORMED ON PREVIOUS CULTURE WITHIN THE LAST 5 DAYS. Performed  at [facility], [place] [address], [place], [place] [address]    Report Status 10/14/2019 FINAL  Final  Respiratory Panel by RT PCR (Flu A&B, Covid) - Nasopharyngeal Swab     Status: None   Collection Time: 10/11/19  7:11 AM   Specimen: Nasopharyngeal Swab  Result Value Ref Range  Status   SARS Coronavirus 2 by RT PCR NEGATIVE NEGATIVE Final    Comment: (NOTE) SARS-CoV-2 target nucleic acids are NOT DETECTED. The SARS-CoV-2 RNA is generally detectable in upper respiratoy specimens during the acute phase of infection. The lowest concentration of SARS-CoV-2 viral copies this assay can detect is 131 copies/mL. A negative result does not preclude SARS-Cov-2 infection and should not be used as the sole basis for treatment or other patient management decisions. A negative result may occur with  improper specimen collection/handling, submission of specimen other than nasopharyngeal swab, presence of viral mutation(s) within the areas targeted by this assay, and inadequate number of viral copies (<131 copies/mL). A negative result must be combined with clinical observations, patient history, and epidemiological information. The expected result is Negative. Fact Sheet for Patients:  PinkCheek.be Fact Sheet for Healthcare Providers:  GravelBags.it This test is not yet ap proved or cleared by the [place] FDA and  has been authorized for detection and/or diagnosis of SARS-CoV-2 by FDA under an Emergency Use Authorization (EUA). This EUA will remain  in effect (meaning this test can be used) for the duration of the COVID-19 declaration under Section 564(b)(1) of the Act, 21 U.S.C. section 360bbb-3(b)(1), unless the authorization is terminated or revoked sooner.    Influenza A by PCR NEGATIVE NEGATIVE Final   Influenza B by PCR NEGATIVE NEGATIVE Final    Comment: (NOTE) The Xpert Xpress SARS-CoV-2/FLU/RSV assay is intended as an  aid in  the diagnosis of influenza from Nasopharyngeal swab specimens and  should not be used as a sole basis for treatment. Nasal washings and  aspirates are unacceptable for Xpert Xpress SARS-CoV-2/FLU/RSV  testing. Fact Sheet for Patients: PinkCheek.be Fact Sheet for Healthcare Providers: GravelBags.it This test is not yet approved or cleared by the [place] FDA and  has been authorized for detection and/or diagnosis of SARS-CoV-2 by  FDA under an Emergency Use Authorization (EUA). This EUA will remain  in effect (meaning this test can be used) for the duration of the  Covid-19 declaration under Section 564(b)(1) of the Act, 21  U.S.C. section 360bbb-3(b)(1), unless the authorization is  terminated or revoked. Performed at [facility], [place] [address], [place], [place] [address]   Blood Culture (routine x 2)     Status: Abnormal   Collection Time: 10/11/19  7:15 AM   Specimen: BLOOD  Result Value Ref Range Status   Specimen Description   Final    BLOOD LEFT ANTECUBITAL Performed at [facility] [address], [place], [place] [address]    Special Requests   Final    BOTTLES DRAWN AEROBIC AND ANAEROBIC Blood Culture adequate volume Performed at [facility] [address], [place], [place] [address]    Culture  Setup Time   Final    GRAM POSITIVE COCCI IN CHAINS IN BOTH AEROBIC AND ANAEROBIC BOTTLES CRITICAL RESULT CALLED TO, READ BACK BY AND VERIFIED WITH: PHARMD M RENZ [id number] AT 908 AM  BY CM Performed at [facility], [place] [address], [place], [place] [address]    Culture STREPTOCOCCUS GROUP F (A)  Final   Report Status 10/14/2019 FINAL  Final   Organism ID, Bacteria STREPTOCOCCUS GROUP F  Final      Susceptibility   Streptococcus group f - MIC*    PENICILLIN 0.25 INTERMEDIATE Intermediate     CEFTRIAXONE 1 SENSITIVE Sensitive     ERYTHROMYCIN  <=0.12 SENSITIVE Sensitive  LEVOFLOXACIN 0.5 SENSITIVE Sensitive     VANCOMYCIN 0.5 SENSITIVE Sensitive     * STREPTOCOCCUS GROUP F  Blood Culture ID Panel (Reflexed)     Status: Abnormal   Collection Time: 10/11/19  7:15 AM  Result Value Ref Range Status   Enterococcus species NOT DETECTED NOT DETECTED Final   Listeria monocytogenes NOT DETECTED NOT DETECTED Final   Staphylococcus species NOT DETECTED NOT DETECTED Final   Staphylococcus aureus (BCID) NOT DETECTED NOT DETECTED Final   Streptococcus species DETECTED (A) NOT DETECTED Final    Comment: Not Enterococcus species, Streptococcus agalactiae, Streptococcus pyogenes, or Streptococcus pneumoniae. CRITICAL RESULT CALLED TO, READ BACK BY AND VERIFIED WITH: PHARMD M RENZ 121020 AT 26 BY CM    Streptococcus agalactiae NOT DETECTED NOT DETECTED Final   Streptococcus pneumoniae NOT DETECTED NOT DETECTED Final   Streptococcus pyogenes NOT DETECTED NOT DETECTED Final   Acinetobacter baumannii NOT DETECTED NOT DETECTED Final   Enterobacteriaceae species NOT DETECTED NOT DETECTED Final   Enterobacter cloacae complex NOT DETECTED NOT DETECTED Final   Escherichia coli NOT DETECTED NOT DETECTED Final   Klebsiella oxytoca NOT DETECTED NOT DETECTED Final   Klebsiella pneumoniae NOT DETECTED NOT DETECTED Final   Proteus species NOT DETECTED NOT DETECTED Final   Serratia marcescens NOT DETECTED NOT DETECTED Final   Haemophilus influenzae NOT DETECTED NOT DETECTED Final   Neisseria meningitidis NOT DETECTED NOT DETECTED Final   Pseudomonas aeruginosa NOT DETECTED NOT DETECTED Final   Candida albicans NOT DETECTED NOT DETECTED Final   Candida glabrata NOT DETECTED NOT DETECTED Final   Candida krusei NOT DETECTED NOT DETECTED Final   Candida parapsilosis NOT DETECTED NOT DETECTED Final   Candida tropicalis NOT DETECTED NOT DETECTED Final    Comment: Performed at [facility], [place]. [address], [place], [place] [address]  Urine culture      Status: None   Collection Time: 10/11/19  6:41 PM   Specimen: In/Out Cath Urine  Result Value Ref Range Status   Specimen Description   Final    IN/OUT CATH URINE Performed at [facility], [place] [address] [place], [place] [address]    Special Requests   Final    NONE Performed at [facility], [place] [address] [place], [place] [address]    Culture   Final    NO GROWTH Performed at [facility], [place] [address] [place], [place] [address]    Report Status 10/12/2019 FINAL  Final  Aerobic/Anaerobic Culture (surgical/deep wound)     Status: None (Preliminary result)   Collection Time: 10/12/19  8:58 AM   Specimen: Abscess  Result Value Ref Range Status   Specimen Description   Final    ABSCESS ABDOMEN Performed at [facility] [address] [place], [place] [address]    Special Requests   Final    NONE Performed at [facility], [place] [address], [place], [place] [address]    Gram Stain   Final    ABUNDANT WBC PRESENT, PREDOMINANTLY PMN MODERATE GRAM NEGATIVE COCCOBACILLI Performed at [facility], [place] [address] [place], [place] [address]    Culture   Final    ABUNDANT STREPTOCOCCUS GROUP F FEW GRAM NEGATIVE RODS IDENTIFICATION AND SUSCEPTIBILITIES TO FOLLOW NO ANAEROBES ISOLATED; CULTURE IN PROGRESS FOR 5 DAYS    Report Status PENDING  Incomplete   Organism ID, Bacteria STREPTOCOCCUS GROUP F  Final      Susceptibility  Streptococcus group f - MIC*    PENICILLIN 0.25 INTERMEDIATE Intermediate     CEFTRIAXONE 1 SENSITIVE Sensitive     ERYTHROMYCIN <=0.12 SENSITIVE Sensitive     LEVOFLOXACIN 0.5 SENSITIVE Sensitive     VANCOMYCIN 0.5 SENSITIVE Sensitive     * ABUNDANT STREPTOCOCCUS GROUP F  Culture, blood (routine x 2)     Status: None (Preliminary result)   Collection Time: 10/13/19  5:18 AM   Specimen: BLOOD  Result Value Ref Range Status   Specimen Description   Final     BLOOD RIGHT WRIST Performed at [facility] [address], [place], [place] [address]    Special Requests   Final    BOTTLES DRAWN AEROBIC AND ANAEROBIC Blood Culture adequate volume Performed at [facility] [address] [place], [place] [address]    Culture   Final    NO GROWTH < 24 HOURS Performed at [facility] [address], [place], [place] [address]    Report Status PENDING  Incomplete  Culture, blood (routine x 2)     Status: None (Preliminary result)   Collection Time: 10/13/19  5:18 AM   Specimen: BLOOD  Result Value Ref Range Status   Specimen Description   Final    BLOOD RIGHT ARM Performed at [facility] [address], [place], [place] [address]    Special Requests   Final    BOTTLES DRAWN AEROBIC AND ANAEROBIC Blood Culture adequate volume Performed at [facility] [address], [place], [place] [address]    Culture   Final    NO GROWTH < 24 HOURS Performed at [facility] [address] [place], [place] [address]    Report Status PENDING  Incomplete         Radiology Studies: CT ABDOMEN PELVIS W CONTRAST  Result Date: 10/13/2019 CLINICAL DATA:  58 year old with history of metastatic pancreatic cancer and status post Whipple. History of intra-abdominal abscess and recent percutaneous drain placement in a perihepatic abscess on 10/12/2019. EXAM: CT ABDOMEN AND PELVIS WITH CONTRAST TECHNIQUE: Multidetector CT imaging of the abdomen and pelvis was performed using the standard protocol following bolus administration of intravenous contrast. CONTRAST:  172m OMNIPAQUE IOHEXOL 300 MG/ML  SOLN COMPARISON:  CT 10/10/2019 FINDINGS: Lower chest: Again noted is a poorly defined pleural-based density in the right lower lobe on sequence 6, image 14. Dependent densities are most compatible with atelectasis. Interval development of trace bilateral pleural effusions.  Hepatobiliary: Again noted are innumerable hypodensities throughout the liver compatible with metastatic disease. Index lesion near the central aspect of the liver measures up to 2.9 cm. Small amount of perihepatic ascites has increased from the recent examination. Postsurgical changes compatible with Whipple procedure and hepatojejunostomy. Poorly defined structures in the central aspect of the liver related to the surgical anastomosis and liver lesions in this area. Evidence for areas of segmental intrahepatic biliary dilatation. Persistent intrahepatic biliary dilatation in the right hepatic lobe on sequence 2, image 29. Limited evaluation of the central biliary structures due to the prior surgery. Pancreas: Post Whipple.  Remaining pancreas is not well visualized. Spleen: Stable appearance of the spleen. Increased perisplenic ascites. Adrenals/Urinary Tract: Normal appearance of both adrenal glands. Negative for hydronephrosis. Evidence for a cortical cyst in the right kidney upper pole. No suspicious renal lesions. Mild distention of the urinary bladder. Stomach/Bowel: Colonic diverticula. Previous examination demonstrated abnormal tissue or soft tissue thickening near the hepatic  flexure of the colon but this area is poorly characterized without oral contrast. No evidence for a bowel obstruction. Evidence for a gastrojejunostomy related to the Whipple procedure. Vascular/Lymphatic: Atherosclerotic calcifications in the abdominal aorta without aneurysm. There are probably abnormal lymph nodes in the central upper abdomen and porta hepatis region but poorly characterized on this examination. There may be narrowing in the main portal vein on sequence 2, image 38 which could be secondary to lymphadenopathy. Main portal vein is poorly characterized on this examination but portal vein is patent. Reproductive: Stable appearance of the prostate. Other: Small amount ascites in the pelvis is new. Slightly increased  ascites in the abdomen with some mesenteric edema. The perihepatic fluid collection from the previous examination has resolved with the percutaneous drain. Percutaneous drain is positioned the perihepatic space and similar to the placement images. Prior examination raised concern for tumor or abscess collection near the hepatic flexure of the colon. There is no longer a definite fluid collection adjacent to the hepatic flexure. There may be a small connection between the percutaneous drain and where the previous hepatic flexure collection was located, suggesting this hepatic flexure collection has been decompressed with the percutaneous drain. No new or additional intra-abdominal abscess collections. Musculoskeletal: No acute bone abnormality. IMPRESSION: 1. Perihepatic abscess collection has resolved with the percutaneous drain. In addition, the collection adjacent to the hepatic flexure of the colon has either resolved or decompressed in the interim and probably related to the percutaneous drain. However, the hepatic flexure area is poorly characterized on this examination. 2. Innumerable liver lesions compatible with metastatic disease. In addition, there are areas of intrahepatic biliary dilatation likely related to obstruction from liver lesions. Biliary dilatation is likely segmental and scattered throughout the liver. 3. Increased ascites in the abdomen and pelvis. Overall ascites volume is small. Trace pleural fluid has developed. Electronically Signed   By: Markus Daft M.D.   On: 10/13/2019 12:37   IR INT EXT BILIARY DRAIN WITH CHOLANGIOGRAM  Result Date: 10/13/2019 INDICATION: Metastatic pancreatic carcinoma status post Whipple. Worsening bilirubin with evidence of intrahepatic biliary ductal dilatation on recent cross-sectional imaging. EXAM: PERCUTANEOUS TRANSHEPATIC CHOLANGIOGRAM PERCUTANEOUS INTERNAL/EXTERNAL BILIARY DRAIN CATHETER PLACEMENT MEDICATIONS: Patient was already receiving adequate  prophylactic antibiotic coverage as an inpatient. ANESTHESIA/SEDATION: Intravenous Fentanyl 150mg and Versed '2mg'$  were administered as conscious sedation during continuous monitoring of the patient's level of consciousness and physiological / cardiorespiratory status by the radiology RN, with a total moderate sedation time of 23 minutes. PROCEDURE: Informed written consent was obtained from the patient after a thorough discussion of the procedural risks, benefits and alternatives. All questions were addressed. Maximal Sterile Barrier Technique was utilized including caps, mask, sterile gowns, sterile gloves, sterile drape, hand hygiene and skin antiseptic. A timeout was performed prior to the initiation of the procedure. Dilated intrahepatic biliary tree was identified on ultrasound and a peripheral right lower lobe duct was selected for approach. Skin and subcutaneous tissues down to the liver capsule infiltrated with 1% lidocaine. Under real-time ultrasound guidance, a 21 gauge trocar needle was advanced into the duct. 018 guidewire advanced centrally easily. Micro dilator was placed over the wire. Small contrast injection under fluoroscopy confirmed appropriate positioning. Micro dilator was exchanged over the 018 guidewire for a 5 [place] dilator set. Contrast injection for percutaneous transhepatic cholangiogram was performed. Catheter exchanged over an angled Glidewire for a 5 French Kumpe catheter, advanced across the choledochojejunostomy. The Kumpe was exchanged over an Amplatz wire for vascular dilator which facilitated  placement of a 10 French internal/external biliary drain catheter, placed with sideholes spanning the central obstruction, across the CBD and into the choledochojejunostomy. Confirmatory contrast injection under fluoroscopy performed. The catheter was then flushed with saline and secured externally with 0 Prolene suture and StatLock, placed to external drain bag to maximize  biliary decompression. The patient tolerated the procedure well. FLUOROSCOPY TIME:  2 minutes 30 seconds; 147 mGy COMPLICATIONS: None immediate. FINDINGS: The percutaneous transhepatic cholangiogram confirms dilated intrahepatic right biliary tree . Multiple segments of tapered narrowing near the biliary confluence with the CBD. The CBD is decompressed. The choledochojejunostomy is widely patent. 10 French internal/external biliary drainage catheter placed as above. IMPRESSION: 1. Multiple central regions of tapered intrahepatic biliary ductal stenosis likely secondary to the patient's known extensive hepatic metastatic disease. 2. Technically successful internal/external biliary drain catheter placement. Electronically Signed   By: Lucrezia Europe M.D.   On: 10/13/2019 16:47        Scheduled Meds:  sodium chloride   Intravenous Once   B-complex with vitamin C  1 tablet Oral Daily   Chlorhexidine Gluconate Cloth  6 each Topical Daily   ferrous sulfate  325 mg Oral BID WC   insulin aspart  0-5 Units Subcutaneous QHS   insulin aspart  0-9 Units Subcutaneous TID WC   lidocaine  1 patch Transdermal QHS   lipase/protease/amylase  36,000 Units Oral TID AC   phosphorus  250 mg Oral TID   sodium chloride flush  10-40 mL Intracatheter Q12H   sodium chloride flush  5 mL Intracatheter Q8H   Continuous Infusions:  sodium chloride 125 mL/hr at 10/14/19 0707   cefTRIAXone (ROCEPHIN)  IV 2 g (10/13/19 2035)     LOS: 3 days    Time spent:     Shawna Clamp, MD [facility]   If 7PM-7AM, please contact night-coverage

## 2019-10-14 NOTE — Progress Notes (Signed)
OT Cancellation Note  Patient Details Name: Brian Torres MRN: [id number] DOB: 1960/12/25   Cancelled Treatment:    Reason Eval/Treat Not Completed: OT screened, no needs identified, will sign off   Kari Baars, [place] [phone] Office- [phone], Edwena Felty D 10/14/2019, 1:34 PM

## 2019-10-15 LAB — COMPREHENSIVE METABOLIC PANEL
ALT: 69 U/L — ABNORMAL HIGH (ref 0–44)
AST: 39 U/L (ref 15–41)
Albumin: 1.5 g/dL — ABNORMAL LOW (ref 3.5–5.0)
Alkaline Phosphatase: 516 U/L — ABNORMAL HIGH (ref 38–126)
Anion gap: 7 (ref 5–15)
BUN: 8 mg/dL (ref 6–20)
CO2: 21 mmol/L — ABNORMAL LOW (ref 22–32)
Calcium: 7.5 mg/dL — ABNORMAL LOW (ref 8.9–10.3)
Chloride: 103 mmol/L (ref 98–111)
Creatinine, Ser: 0.72 mg/dL (ref 0.61–1.24)
GFR calc Af Amer: >60 mL/min (ref >60)
GFR calc non Af Amer: >60 mL/min (ref >60)
Glucose, Bld: 211 mg/dL — ABNORMAL HIGH (ref 70–99)
Potassium: 3.7 mmol/L (ref 3.5–5.1)
Sodium: 131 mmol/L — ABNORMAL LOW (ref 135–145)
Total Bilirubin: 9.6 mg/dL — ABNORMAL HIGH (ref 0.3–1.2)
Total Protein: 5.4 g/dL — ABNORMAL LOW (ref 6.5–8.1)

## 2019-10-15 LAB — CBC
HCT: 23.7 % — ABNORMAL LOW (ref 39.0–52.0)
Hemoglobin: 7.7 g/dL — ABNORMAL LOW (ref 13.0–17.0)
MCH: 27.6 pg (ref 26.0–34.0)
MCHC: 32.5 g/dL (ref 30.0–36.0)
MCV: 84.9 fL (ref 80.0–100.0)
Platelets: 200 10*3/uL (ref 150–400)
RBC: 2.79 MIL/uL — ABNORMAL LOW (ref 4.22–5.81)
RDW: 19.5 % — ABNORMAL HIGH (ref 11.5–15.5)
WBC: 10.2 10*3/uL (ref 4.0–10.5)
nRBC: 0 % (ref 0.0–0.2)

## 2019-10-15 LAB — PHOSPHORUS: Phosphorus: 2.3 mg/dL — ABNORMAL LOW (ref 2.5–4.6)

## 2019-10-15 LAB — PREPARE RBC (CROSSMATCH)

## 2019-10-15 LAB — OCCULT BLOOD X 1 CARD TO LAB, STOOL: Fecal Occult Bld: POSITIVE — AB

## 2019-10-15 LAB — GLUCOSE, CAPILLARY
Glucose-Capillary: 195 mg/dL — ABNORMAL HIGH (ref 70–99)
Glucose-Capillary: 437 mg/dL — ABNORMAL HIGH (ref 70–99)
Glucose-Capillary: 519 mg/dL (ref 70–99)
Glucose-Capillary: 337 mg/dL — ABNORMAL HIGH (ref 70–99)

## 2019-10-15 LAB — MAGNESIUM: Magnesium: 1.4 mg/dL — ABNORMAL LOW (ref 1.7–2.4)

## 2019-10-15 MED ORDER — DIPHENHYDRAMINE HCL 25 MG PO CAPS
25.0000 mg | ORAL_CAPSULE | Freq: Once | ORAL | Status: AC
  Administered 2019-10-15: 25 mg via ORAL
  Filled 2019-10-15: qty 1

## 2019-10-15 MED ORDER — PANTOPRAZOLE SODIUM 40 MG IV SOLR
40.0000 mg | Freq: Two times a day (BID) | INTRAVENOUS | Status: DC
  Administered 2019-10-15: 40 mg via INTRAVENOUS
  Filled 2019-10-15: qty 40

## 2019-10-15 MED ORDER — INSULIN ASPART 100 UNIT/ML ~~LOC~~ SOLN
0.0000 [IU] | Freq: Three times a day (TID) | SUBCUTANEOUS | Status: DC
  Administered 2019-10-15: 15 [IU] via SUBCUTANEOUS
  Administered 2019-10-16: 5 [IU] via SUBCUTANEOUS
  Administered 2019-10-16: 11 [IU] via SUBCUTANEOUS

## 2019-10-15 MED ORDER — K PHOS MONO-SOD PHOS DI & MONO 155-852-130 MG PO TABS
250.0000 mg | ORAL_TABLET | Freq: Three times a day (TID) | ORAL | Status: DC
  Administered 2019-10-15 – 2019-10-16 (×3): 250 mg via ORAL
  Filled 2019-10-15 (×5): qty 1

## 2019-10-15 MED ORDER — MAGNESIUM SULFATE 2 GM/50ML IV SOLN
2.0000 g | Freq: Once | INTRAVENOUS | Status: AC
  Administered 2019-10-15: 2 g via INTRAVENOUS
  Filled 2019-10-15: qty 50

## 2019-10-15 MED ORDER — INSULIN ASPART 100 UNIT/ML ~~LOC~~ SOLN
0.0000 [IU] | Freq: Every day | SUBCUTANEOUS | Status: DC
  Administered 2019-10-15: 4 [IU] via SUBCUTANEOUS

## 2019-10-15 MED ORDER — SODIUM CHLORIDE 0.9% IV SOLUTION
Freq: Once | INTRAVENOUS | Status: AC
  Administered 2019-10-15: 18:00:00 via INTRAVENOUS

## 2019-10-15 NOTE — Progress Notes (Signed)
CRITICAL VALUE ALERT  Critical Value:  CBG 519  Date & Time Notied:  10/15/19 1730  Provider Notified: Dwyane Dee  Orders Received/Actions taken: see orders

## 2019-10-15 NOTE — Consult Note (Addendum)
Referring Provider: Dr. Dwyane Dee Primary Care Physician:  Cari Caraway, MD Primary Gastroenterologist:  Dr. Paulita Fujita  Reason for Consultation:  Heme positive stool; Anemia  HPI: Brian Torres is a 58 y.o. male with metastatic pancreatic cancer to the liver who is s/p Whipple's and J-tube placement 07/2019 admitted for fevers/chills and has a history of intra-abdominal abscesses that were drained in October and positive blood cultures for Streptococcus species. Seen for a consult due to worsening anemia and heme positive stool. Hgb drop to 6.6 on 10/13/19 and transfused 2 U PRBCs to 8.2 yesterday and now 7.7. No rectal bleeding, melena, or hematemesis. Chronic nausea and GERD. Internal/external biliary drain placed 10/13/19. On Pepcid BID at home. On Eliquis as outpt. Wife reports that [facility] as an outpt has helped his eating. Tolerating regular diet on SSI. Wife at bedside.  Past Medical History:  Diagnosis Date  . Cancer ([place]) 12/2018   pancreatic cancer  . Depression   . Diabetes mellitus without complication ([place])    type 2  . Dyslipidemia   . Erectile dysfunction   . Family history of breast cancer   . Family history of melanoma   . Hypertension    white coat syndrome  . Nicotine addiction     Past Surgical History:  Procedure Laterality Date  . BILIARY STENT PLACEMENT N/A 12/21/2018   Procedure: BILIARY STENT PLACEMENT;  Surgeon: Clarene Essex, MD;  Location: [facility];  Service: Endoscopy;  Laterality: N/A;  . BILIARY STENT PLACEMENT N/A 01/24/2019   Procedure: BILIARY STENT PLACEMENT;  Surgeon: Arta Silence, MD;  Location: [facility];  Service: Endoscopy;  Laterality: N/A;  . BIOPSY  12/21/2018   Procedure: BIOPSY;  Surgeon: Clarene Essex, MD;  [facility];  Service: Endoscopy;;  . ENDOSCOPIC RETROGRADE CHOLANGIOPANCREATOGRAPHY (ERCP) WITH PROPOFOL N/A 12/21/2018   Procedure: ENDOSCOPIC RETROGRADE CHOLANGIOPANCREATOGRAPHY (ERCP) WITH PROPOFOL;  Surgeon: Clarene Essex, MD;   [facility];  Service: Endoscopy;  Laterality: N/A;  . ENDOSCOPIC RETROGRADE CHOLANGIOPANCREATOGRAPHY (ERCP) WITH PROPOFOL N/A 01/24/2019   Procedure: ENDOSCOPIC RETROGRADE CHOLANGIOPANCREATOGRAPHY (ERCP) WITH PROPOFOL;  Surgeon: Arta Silence, MD;  [facility];  Service: Endoscopy;  Laterality: N/A;  Balloon Sweep of Duct  . ESOPHAGOGASTRODUODENOSCOPY N/A 01/03/2019   Procedure: ESOPHAGOGASTRODUODENOSCOPY (EGD);  Surgeon: Arta Silence, MD;  Location: [name];  Service: Endoscopy;  Laterality: N/A;  . ESOPHAGOGASTRODUODENOSCOPY (EGD) WITH PROPOFOL N/A 01/24/2019   Procedure: ESOPHAGOGASTRODUODENOSCOPY (EGD) WITH PROPOFOL;  Surgeon: Arta Silence, MD;  [facility];  Service: Endoscopy;  Laterality: N/A;  stent removal  . EUS N/A 01/03/2019   Procedure: FULL UPPER ENDOSCOPIC ULTRASOUND (EUS) Linear not radial;  Surgeon: Arta Silence, MD;  [facility];  Service: Endoscopy;  Laterality: N/A;  . EUS N/A 01/24/2019   Procedure: FULL UPPER ENDOSCOPIC ULTRASOUND (EUS) RADIAL;  Surgeon: Arta Silence, MD;  [facility];  Service: Endoscopy;  Laterality: N/A;  . FINE NEEDLE ASPIRATION N/A 01/03/2019   Procedure: FINE NEEDLE ASPIRATION (FNA) LINEAR;  Surgeon: Arta Silence, MD;  [facility];  Service: Endoscopy;  Laterality: N/A;  . FINE NEEDLE ASPIRATION N/A 01/24/2019   Procedure: FINE NEEDLE ASPIRATION (FNA) LINEAR;  Surgeon: Arta Silence, MD;  [facility];  Service: Endoscopy;  Laterality: N/A;  . IR INT EXT BILIARY DRAIN WITH CHOLANGIOGRAM  10/13/2019  . IR RADIOLOGIST EVAL & MGMT  08/24/2019  . LAPAROSCOPY N/A 07/20/2019   Procedure: LAPAROSCOPY DIAGNOSTIC;  Surgeon: [name], MD;  Location: [facility];  Service: General;  Laterality: N/A;  .  LOWER EXTREMITY ANGIOGRAPHY N/A 10/18/2017   Procedure: LOWER EXTREMITY ANGIOGRAPHY - IVUS - LYSIS CATH;  Surgeon: Waynetta Sandy, MD;  Location: [place] [facility];  Service: Cardiovascular;  Laterality: N/A;  . LOWER EXTREMITY ANGIOGRAPHY N/A 10/19/2017   Procedure: LOWER EXTREMITY ANGIOGRAPHY - LYSIS RECHECK;  Surgeon: Waynetta Sandy, MD;  Location: [place] [facility];  Service: Cardiovascular;  Laterality: N/A;  . PANCREATIC STENT PLACEMENT  12/21/2018   Procedure: PANCREATIC STENT PLACEMENT;  Surgeon: Clarene Essex, MD;  [facility];  Service: Endoscopy;;  . PEG PLACEMENT N/A 07/20/2019   Procedure: FEEDING TUBE;  Surgeon: [name], MD;  Location: [place];  Service: General;  Laterality: N/A;  . PERIPHERAL VASCULAR BALLOON ANGIOPLASTY Left 10/19/2017   Procedure: PERIPHERAL VASCULAR BALLOON ANGIOPLASTY;  Surgeon: Waynetta Sandy, MD;  Location: [place] [facility];  Service: Cardiovascular;  Laterality: Left;  Multiple Lower extremity venous sites  . PERIPHERAL VASCULAR INTERVENTION Left 10/18/2017   Procedure: PERIPHERAL VASCULAR INTERVENTION;  Surgeon: Waynetta Sandy, MD;  Location: [place] [facility];  Service: Cardiovascular;  Laterality: Left;  . PORTACATH PLACEMENT Left 02/02/2019   Procedure: INSERTION PORT-A-CATH WITH POSSIBLE ULTRASOUND;  Surgeon: [name], MD;  Location: [place];  Service: General;  Laterality: Left;  . REMOVAL OF STONES  12/21/2018   Procedure: REMOVAL OF STONES;  Surgeon: Clarene Essex, MD;  [facility];  Service: Endoscopy;;  . Joan Mayans  12/21/2018   Procedure: Joan Mayans;  Surgeon: Clarene Essex, MD;  [facility];  Service: Endoscopy;;  . STENT REMOVAL  01/24/2019   Procedure: STENT REMOVAL;  Surgeon: Arta Silence, MD;  [facility];  Service: Endoscopy;;  . WHIPPLE PROCEDURE N/A 07/20/2019   Procedure: WHIPPLE PROCEDURE;  Surgeon: [name], MD;  Location: [place];  Service: General;  Laterality: N/A;    Prior to Admission medications   Medication Sig Start Date End Date Taking? Authorizing Provider  acetaminophen  (TYLENOL) 500 MG tablet Take 1,000 mg by mouth every 6 (six) hours as needed for moderate pain or headache.   Yes [provider]  apixaban (ELIQUIS) 5 MG TABS tablet Take 1 tablet (5 mg total) by mouth 2 (two) times daily. 01/04/19  Yes Arta Silence, MD  b complex vitamins tablet Take 1 tablet by mouth daily.   Yes [provider]  CINNAMON PO Take 1,000 mg by mouth 2 (two) times daily.   Yes [provider]  Coenzyme Q10 (COQ-10 PO) Take 1 tablet by mouth daily.   Yes [provider]  CREON 36000 units CPEP capsule Take by mouth. Takes #2 with meals and #1 with snack 09/30/19  Yes [provider]  famotidine (PEPCID AC) 10 MG tablet Take 10 mg by mouth 2 (two) times daily as needed for heartburn or indigestion.   Yes [provider]  ferrous sulfate 325 (65 FE) MG EC tablet Take 325 mg by mouth 2 (two) times a day.    Yes [provider]  glimepiride (AMARYL) 4 MG tablet Take 4 mg by mouth daily with breakfast.  10/13/17  Yes [provider]  HUMALOG KWIKPEN 100 UNIT/ML KwikPen Inject 3-13 Units into the skin 2 (two) times daily as needed (if bs is over 150).  06/29/19  Yes [provider]  Insulin Glargine (BASAGLAR KWIKPEN) 100 UNIT/ML SOPN Inject 19 Units into the skin at bedtime.  10/19/18  Yes [provider]  Lidocaine (HM LIDOCAINE PATCH) 4 % PTCH Apply  1 patch topically at bedtime.   Yes [provider]  lidocaine-prilocaine (EMLA) cream Apply 1 application topically as needed (port access). 10/09/19  Yes Ladell Pier, MD  lisinopril (PRINIVIL,ZESTRIL) 10 MG tablet Take 10 mg by mouth daily. 10/13/17  Yes [provider]  LORazepam (ATIVAN) 0.5 MG tablet Take 1 tablet (0.5 mg total) by mouth every 8 (eight) hours as needed (for nausea). 05/01/19  Yes Ladell Pier, MD  Melatonin 10 MG TABS Take 20 mg by mouth at bedtime.   Yes [provider]  metFORMIN (GLUCOPHAGE) 1000  MG tablet Take 1,000 mg by mouth 2 (two) times daily. 10/13/17  Yes [provider]  methocarbamol (ROBAXIN) 500 MG tablet Take 1 tablet (500 mg total) by mouth every 6 (six) hours as needed for muscle spasms. 07/28/19  Yes [name], MD  Omega-3 Fatty Acids (FISH OIL) 1000 MG CAPS Take 1,000 mg by mouth 2 (two) times daily.    Yes [provider]  ondansetron (ZOFRAN-ODT) 4 MG disintegrating tablet Take 1 tablet (4 mg total) by mouth every 6 (six) hours as needed for nausea. 10/02/19  Yes Ladell Pier, MD  oxyCODONE (OXY IR/ROXICODONE) 5 MG immediate release tablet Take 1 tablet (5 mg total) by mouth every 6 (six) hours as needed for moderate pain, severe pain or breakthrough pain. 10/02/19  Yes Ladell Pier, MD  pioglitazone (ACTOS) 45 MG tablet Take 45 mg by mouth daily. 10/13/17  Yes [provider]  prochlorperazine (COMPAZINE) 10 MG tablet Take 1 tablet (10 mg total) by mouth every 6 (six) hours as needed for nausea or vomiting. 04/14/19  Yes Ladell Pier, MD    Scheduled Meds: . sodium chloride   Intravenous Once  . sodium chloride   Intravenous Once  . B-complex with vitamin C  1 tablet Oral Daily  . Chlorhexidine Gluconate Cloth  6 each Topical Daily  . ferrous sulfate  325 mg Oral BID WC  . insulin aspart  0-5 Units Subcutaneous QHS  . insulin aspart  0-9 Units Subcutaneous TID WC  . lidocaine  1 patch Transdermal QHS  . lipase/protease/amylase  36,000 Units Oral TID AC  . phosphorus  250 mg Oral TID  . sodium chloride flush  10-40 mL Intracatheter Q12H  . sodium chloride flush  5 mL Intracatheter Q8H   Continuous Infusions: . sodium chloride 125 mL/hr at 10/15/19 1007  . cefTRIAXone (ROCEPHIN)  IV 2 g (10/14/19 2044)   PRN Meds:.acetaminophen, famotidine, Melatonin, ondansetron, oxyCODONE, prochlorperazine  Allergies as of 10/11/2019  . (No Known Allergies)    Family History  Problem Relation Age of Onset  . Diabetes Mother   .  Diabetes Father   . CAD Father   . Melanoma Father   . Breast cancer Paternal Aunt 7  . COPD Maternal Aunt   . Melanoma Paternal Uncle   . Stroke Maternal Grandmother   . Tuberculosis Paternal Grandmother   . Cancer Neg Hx     Social History   Socioeconomic History  . Marital status: Married    Spouse name: Not on file  . Number of children: Not on file  . Years of education: Not on file  . Highest education level: Not on file  Occupational History  . Not on file  Tobacco Use  . Smoking status: Current Every Day Smoker    Packs/day: 1.00    Types: Cigarettes  . Smokeless tobacco: Never Used  Substance and Sexual Activity  .  Alcohol use: No  . Drug use: No  . Sexual activity: Not on file  Other Topics Concern  . Not on file  Social History Narrative  . Not on file   Social Determinants of Health   Financial Resource Strain:   . Difficulty of Paying Living Expenses: Not on file  Food Insecurity:   . Worried About Charity fundraiser in the Last Year: Not on file  . Ran Out of Food in the Last Year: Not on file  Transportation Needs:   . Lack of Transportation (Medical): Not on file  . Lack of Transportation (Non-Medical): Not on file  Physical Activity:   . Days of Exercise per Week: Not on file  . Minutes of Exercise per Session: Not on file  Stress:   . Feeling of Stress : Not on file  Social Connections:   . Frequency of Communication with Friends and Family: Not on file  . Frequency of Social Gatherings with Friends and Family: Not on file  . Attends Religious Services: Not on file  . Active Member of Clubs or Organizations: Not on file  . Attends Archivist Meetings: Not on file  . Marital Status: Not on file  Intimate Partner Violence:   . Fear of Current or Ex-Partner: Not on file  . Emotionally Abused: Not on file  . Physically Abused: Not on file  . Sexually Abused: Not on file    Review of Systems: All negative except as stated above  in HPI.  Physical Exam: Vital signs: Vitals:   10/15/19 0516 10/15/19 1438  BP: (!) 141/82 134/80  Pulse: 92 85  Resp: 18 18  Temp: 99.4 F (37.4 C) 98.6 F (37 C)  SpO2: 96% 100%   Last BM Date: 10/14/19 General:   Lethargic, thin, pleasant, no acute distress Head: normocephalic, atraumatic Eyes: +icteric sclera ENT: oropharynx clear Neck: supple, nontender Lungs:  Clear throughout to auscultation.   No wheezes, crackles, or rhonchi. No acute distress. Heart:  Regular rate and rhythm; no murmurs, clicks, rubs,  or gallops. Abdomen: distended, diffuse tenderness with minimal guarding, +BS, JP drain and external biliary catheter noted (dressings not removed)  Rectal:  Deferred Ext: no edema  GI:  Lab Results: Recent Labs    10/13/19 0400 10/13/19 1758 10/14/19 0358 10/15/19 0500  WBC 10.8*  --  11.4* 10.2  HGB 6.6* 7.5* 8.2* 7.7*  HCT 20.4* 22.9* 25.2* 23.7*  PLT 196  --  192 200   BMET Recent Labs    10/13/19 0400 10/14/19 0358 10/15/19 0500  NA 133* 132* 131*  K 3.9 4.0 3.7  CL 105 103 103  CO2 19* 21* 21*  GLUCOSE 173* 221* 211*  BUN 19 11 8   CREATININE 0.94 0.72 0.72  CALCIUM 7.6* 7.7* 7.5*   LFT Recent Labs    10/15/19 0500  PROT 5.4*  ALBUMIN 1.5*  AST 39  ALT 69*  ALKPHOS 516*  BILITOT 9.6*   PT/INR Recent Labs    10/13/19 0831  LABPROT 25.3*  INR 2.3*     Studies/Results: No results found.  Impression/Plan: Metastatic pancreatic cancer with liver abscesses on IV Abx seen for a consult due to worsening anemia and heme positive stool. I think his anemia is multi-factorial and likely mainly from his liver abscesses and metastatic pancreatic cancer. I do NOT think he is having an active GI bleed. Heme positive stool could be due to a hemorrhoidal source. I do not think he needs  an EGD at this time. Protonix 40 mg IV Q 12 hours started. Elevated INR likely due to liver mets and may need to correct his coagulopathy if overt GI bleeding  develops. Supportive care. [facility] will f/u tomorrow.    LOS: 4 days   Lear Ng  10/15/2019, 5:28 PM  Questions please call [phone]

## 2019-10-15 NOTE — Progress Notes (Signed)
Paged MD to make aware of pt's CBG 437, patient had refused lunch CBG due to having a late breakfast and then requested to have it taken prior to eating late lunch. MD states to check CBG at 1600 per order and give SSI per order

## 2019-10-15 NOTE — Progress Notes (Signed)
PROGRESS NOTE    Brian Torres  FVC:[id number] DOB: Nov 08, 1960 DOA: 10/11/2019 PCP: Cari Caraway, MD    Brief Narrative:  Patient is a 50-year male with history of metastatic pancreatic cancer, diabetes type 2, hyperlipidemia, hypertension, depression, history of DVT on Eliquis who presented from home with complaints of fever, chills.  He has history of metastatic pancreatic cancer with liver mets and underwent Whipple's procedure recently. He was also admitted on October and was found to have intraabdominal  abscesses . On this admission, it was reported that he was gradually declining over the past 2 weeks.  Developed fever, chills, diarrhea, abdominal discomfort.  He was hypotensive on arrival.  CT abdomen/pelvis done in the emergency department showed numerous liver metastasis, possible abscesses.  Started on broad-spectrum antibiotics.  IR consulted and he underwent IR guided drainage placement. Now blood cultures growing streptococcal species.ID consulted, suggest He will need a prolonged course of antibiotics but oral therapy with drainage will likely be adequate, depending on the organism sensitivities.    Assessment & Plan:   Active Problems:   Sepsis ([place])   AKI (acute kidney injury) ([place])  # Sepsis/gram-positive bacteremia: Presented with fever, tachycardia, tachypnea, elevated lactic acid level. Currently hemodynamically stable.  Sepsis secondary to liver abscess.  Blood cultures showing Streptococcus species. Antibiotics narrowed to ceftriaxone.  Will follow final culture and sensitivity.  ID consulted  Suggest  keep him on IV therapy while inpatient. CT of abdomen today shows resolution of the fluid collection/abscess.  Transition to oral Augmentin at discharge for another 7 days to assure complete resolution with bacteremia.      Liver abscess: Underwent CT-guided right upper quadrant drain placement for abscess on 10/12/19.  CT abdomen showed numerous liver mets and  abscesses.  Continue current antibiotics.  LFTs improving.  S/p PTC biliary drain placed 10/13/19  He was hospitalized from 08/07/2019 through 08/11/2019 with postsurgical intra-abdominal fluid collection.  CT at that time showed 2 rim-enhancing fluid collection in the right upper quadrant concerning for abscess, colitis.  Blood cultures had shown Prevotella.  Underwent CT-guided aspiration of intra-abdominal fluid collection and placement of drain  on 08/09/2019.  He was discharged on Augmentin at that time.   Metastatic pancreatic adenocarcinoma: Follows with Dr. Learta Codding.  I stage IV cancer.Marland Kitchen  Has worsening LFTs.  Has metastatic liver disease.  As per oncology, there was plan to begin salvage chemotherapy with gemcitabine/Abraxane on 10/09/2019.  Chemotherapy held due to new hyperbilirubinemia. History of Whipple's procedure on 07/10/19.  Has abdominal wound that is healing. Monitor CMP  AKI: Improved with IV fluids.  Anemia could be chronic sec.to pancreatic cancer: s/p 2 PRBC 10/13/19 , Hb dropped from 8.2 - 7.7, Stool occult blood +, GI consulted, will follow up recommendation. Transfuse 1 PRBC. Hemodynamically stable.  Diabetes mellitus 2: Continue current insulin regimen.  On antihyperglycemics at home.  Hypertension: Hypotensive on arrival.  Antihypertensives held.  History of DVT: On Eliquis.  Keep on hold given Anemia   DVT prophylaxis:  SCDS, Eliquis on hold.  Code Status: Full Family Communication:  D/W patient Disposition Plan: Home after full workup Consultants:    ID  Oncology  GI   Procedures:  Right upper quadrant drain.  Antimicrobials:  Anti-infectives (From admission, onward)   Start     Dose/Rate Route Frequency Ordered Stop   10/12/19 2000  cefTRIAXone (ROCEPHIN) 2 g in sodium chloride 0.9 % 100 mL IVPB     2 g 200 mL/hr over 30 Minutes Intravenous  Every 24 hours 10/12/19 1554     10/12/19 1800  piperacillin-tazobactam (ZOSYN) IVPB 3.375 g  Status:   Discontinued     3.375 g 12.5 mL/hr over 240 Minutes Intravenous Every 8 hours 10/12/19 1348 10/12/19 1554   10/11/19 2000  vancomycin (VANCOCIN) IVPB 1000 mg/200 mL premix  Status:  Discontinued     1,000 mg 200 mL/hr over 60 Minutes Intravenous Every 12 hours 10/11/19 1242 10/12/19 1348   10/11/19 1600  ceFEPIme (MAXIPIME) 2 g in sodium chloride 0.9 % 100 mL IVPB  Status:  Discontinued     2 g 200 mL/hr over 30 Minutes Intravenous Every 8 hours 10/11/19 1242 10/12/19 1348   10/11/19 1400  metroNIDAZOLE (FLAGYL) IVPB 500 mg  Status:  Discontinued     500 mg 100 mL/hr over 60 Minutes Intravenous Every 8 hours 10/11/19 1206 10/12/19 1348   10/11/19 0730  vancomycin (VANCOCIN) 1,500 mg in sodium chloride 0.9 % 500 mL IVPB     1,500 mg 250 mL/hr over 120 Minutes Intravenous  Once 10/11/19 0716 10/11/19 1032   10/11/19 0715  ceFEPIme (MAXIPIME) 2 g in sodium chloride 0.9 % 100 mL IVPB     2 g 200 mL/hr over 30 Minutes Intravenous  Once 10/11/19 0714 10/11/19 0758   10/11/19 0715  metroNIDAZOLE (FLAGYL) IVPB 500 mg     500 mg 100 mL/hr over 60 Minutes Intravenous  Once 10/11/19 0714 10/11/19 0912   10/11/19 0715  vancomycin (VANCOCIN) IVPB 1000 mg/200 mL premix  Status:  Discontinued     1,000 mg 200 mL/hr over 60 Minutes Intravenous  Once 10/11/19 7681 10/11/19 0716      Subjective: Patient was seen and examined at bedside, He is hemodynamically stable, complains of abdominal pain when he lies on the right side.  He is lying mainly on left side.  Denies nausea vomiting diarrhea.   Objective: Vitals:   10/14/19 0433 10/14/19 1306 10/14/19 2029 10/15/19 0516  BP: 130/76 128/74 131/68 (!) 141/82  Pulse: 95 98 87 92  Resp: '18 20 18 18  '$ Temp: 99.5 F (37.5 C) 98.2 F (36.8 C) 98.7 F (37.1 C) 99.4 F (37.4 C)  TempSrc:  Oral Oral Oral  SpO2: 99% 100% 100% 96%  Weight:      Height:        Intake/Output Summary (Last 24 hours) at 10/15/2019 1417 Last data filed at 10/15/2019  1228 Gross per 24 hour  Intake 2497.78 ml  Output 1700 ml  Net 797.78 ml   Filed Weights   10/12/19 1506  Weight: 87.6 kg    Examination:  General exam: Appears calm and comfortable, NAD Respiratory system: Clear to auscultation. Respiratory effort normal. Cardiovascular system: S1 & S2 heard, RRR. No JVD, murmurs, rubs, gallops or clicks. No pedal edema. Gastrointestinal system: Abdomen is nondistended, soft and nontender. No organomegaly or masses felt. Normal bowel sounds heard. There is drain with pinkish drainage. PTC drainage noted with orange colored fluid. Central nervous system: Alert and oriented. No focal neurological deficits. Extremities:  No pedal edema, no swelling. Skin: No rashes, lesions or ulcers Psychiatry: Judgement and insight appear normal. Mood & affect appropriate.     Data Reviewed: I have personally reviewed following labs and imaging studies  CBC: Recent Labs  Lab 10/09/19 1024 10/09/19 1024 10/11/19 0710 10/12/19 0445 10/13/19 0400 10/13/19 1758 10/14/19 0358 10/15/19 0500  WBC 13.0*  --  3.1* 10.1 10.8*  --  11.4* 10.2  NEUTROABS 10.5*  --  2.8  --  8.6*  --   --   --   HGB 8.8*   < > 12.9* 7.2* 6.6* 7.5* 8.2* 7.7*  HCT 26.6*  --  39.8 22.3* 20.4* 22.9* 25.2* 23.7*  MCV 87.2  --  87.3 89.2 88.7  --  85.7 84.9  PLT 268  --  169 181 196  --  192 200   < > = values in this interval not displayed.   Basic Metabolic Panel: Recent Labs  Lab 10/11/19 0710 10/12/19 0445 10/13/19 0400 10/14/19 0358 10/15/19 0500  NA 130* 133* 133* 132* 131*  K 4.1 4.1 3.9 4.0 3.7  CL 96* 105 105 103 103  CO2 21* 17* 19* 21* 21*  GLUCOSE 170* 106* 173* 221* 211*  BUN 32* 33* '19 11 8  '$ CREATININE 1.30* 1.15 0.94 0.72 0.72  CALCIUM 8.3* 7.5* 7.6* 7.7* 7.5*  MG  --   --   --  1.5* 1.4*  PHOS  --   --   --  2.4* 2.3*   GFR: Estimated Creatinine Clearance: 122.1 mL/min (by C-G formula based on SCr of 0.72 mg/dL). Liver Function Tests: Recent Labs  Lab  10/11/19 0710 10/12/19 0445 10/13/19 0400 10/14/19 0358 10/15/19 0500  AST 98* 551* 225* 81* 39  ALT 72* 184* 146* 100* 69*  ALKPHOS 894* 747* 660* 615* 516*  BILITOT 6.8* 6.2* 6.4* 8.2* 9.6*  PROT 6.7 6.0* 5.5* 5.4* 5.4*  ALBUMIN 2.1* 1.7* 1.5* 1.5* 1.5*   Recent Labs  Lab 10/11/19 0710  LIPASE 14   Recent Labs  Lab 10/11/19 0721  AMMONIA 40*   Coagulation Profile: Recent Labs  Lab 10/11/19 0710 10/12/19 0445 10/13/19 0831  INR 1.9* 2.1* 2.3*   Cardiac Enzymes: No results for input(s): CKTOTAL, CKMB, CKMBINDEX, TROPONINI in the last 168 hours. BNP (last 3 results) No results for input(s): PROBNP in the last 8760 hours. HbA1C: No results for input(s): HGBA1C in the last 72 hours. CBG: Recent Labs  Lab 10/14/19 0750 10/14/19 1140 10/14/19 1651 10/14/19 2032 10/15/19 0741  GLUCAP 189* 307* 314* 339* 195*   Lipid Profile: No results for input(s): CHOL, HDL, LDLCALC, TRIG, CHOLHDL, LDLDIRECT in the last 72 hours. Thyroid Function Tests: No results for input(s): TSH, T4TOTAL, FREET4, T3FREE, THYROIDAB in the last 72 hours. Anemia Panel: No results for input(s): VITAMINB12, FOLATE, FERRITIN, TIBC, IRON, RETICCTPCT in the last 72 hours. Sepsis Labs: Recent Labs  Lab 10/11/19 0710 10/11/19 0910  LATICACIDVEN 5.2* 3.1*    Recent Results (from the past 240 hour(s))  Blood Culture (routine x 2)     Status: Abnormal   Collection Time: 10/11/19  7:10 AM   Specimen: BLOOD  Result Value Ref Range Status   Specimen Description   Final    BLOOD PORTA CATH Performed at [facility] [address], [place], [place] [address]    Special Requests   Final    BOTTLES DRAWN AEROBIC AND ANAEROBIC Blood Culture adequate volume Performed at [facility] [address], [place], [place] [address]    Culture  Setup Time   Final    GRAM POSITIVE COCCI IN CHAINS CRITICAL RESULT CALLED TO, READ BACK BY AND VERIFIED WITH: B GREEN  PHARMD 10/12/19 0629 JDW IN BOTH AEROBIC AND ANAEROBIC BOTTLES    Culture (A)  Final    STREPTOCOCCUS GROUP F SUSCEPTIBILITIES PERFORMED ON PREVIOUS CULTURE WITHIN THE LAST 5 DAYS. Performed at [facility], [place]  [address] [place], [place] [address]    Report Status 10/14/2019 FINAL  Final  Respiratory Panel by RT PCR (Flu A&B, Covid) - Nasopharyngeal Swab     Status: None   Collection Time: 10/11/19  7:11 AM   Specimen: Nasopharyngeal Swab  Result Value Ref Range Status   SARS Coronavirus 2 by RT PCR NEGATIVE NEGATIVE Final    Comment: (NOTE) SARS-CoV-2 target nucleic acids are NOT DETECTED. The SARS-CoV-2 RNA is generally detectable in upper respiratoy specimens during the acute phase of infection. The lowest concentration of SARS-CoV-2 viral copies this assay can detect is 131 copies/mL. A negative result does not preclude SARS-Cov-2 infection and should not be used as the sole basis for treatment or other patient management decisions. A negative result may occur with  improper specimen collection/handling, submission of specimen other than nasopharyngeal swab, presence of viral mutation(s) within the areas targeted by this assay, and inadequate number of viral copies (<131 copies/mL). A negative result must be combined with clinical observations, patient history, and epidemiological information. The expected result is Negative. Fact Sheet for Patients:  PinkCheek.be Fact Sheet for Healthcare Providers:  GravelBags.it This test is not yet ap proved or cleared by the [place] FDA and  has been authorized for detection and/or diagnosis of SARS-CoV-2 by FDA under an Emergency Use Authorization (EUA). This EUA will remain  in effect (meaning this test can be used) for the duration of the COVID-19 declaration under Section 564(b)(1) of the Act, 21 U.S.C. section 360bbb-3(b)(1), unless the authorization is  terminated or revoked sooner.    Influenza A by PCR NEGATIVE NEGATIVE Final   Influenza B by PCR NEGATIVE NEGATIVE Final    Comment: (NOTE) The Xpert Xpress SARS-CoV-2/FLU/RSV assay is intended as an aid in  the diagnosis of influenza from Nasopharyngeal swab specimens and  should not be used as a sole basis for treatment. Nasal washings and  aspirates are unacceptable for Xpert Xpress SARS-CoV-2/FLU/RSV  testing. Fact Sheet for Patients: PinkCheek.be Fact Sheet for Healthcare Providers: GravelBags.it This test is not yet approved or cleared by the [place] FDA and  has been authorized for detection and/or diagnosis of SARS-CoV-2 by  FDA under an Emergency Use Authorization (EUA). This EUA will remain  in effect (meaning this test can be used) for the duration of the  Covid-19 declaration under Section 564(b)(1) of the Act, 21  U.S.C. section 360bbb-3(b)(1), unless the authorization is  terminated or revoked. Performed at [facility], [place] [address] [place], [place] [address]   Blood Culture (routine x 2)     Status: Abnormal   Collection Time: 10/11/19  7:15 AM   Specimen: BLOOD  Result Value Ref Range Status   Specimen Description   Final    BLOOD LEFT ANTECUBITAL Performed at [facility] [address] [place], [place] [address]    Special Requests   Final    BOTTLES DRAWN AEROBIC AND ANAEROBIC Blood Culture adequate volume Performed at [facility] [address] [place], [place] [address]    Culture  Setup Time   Final    GRAM POSITIVE COCCI IN CHAINS IN BOTH AEROBIC AND ANAEROBIC BOTTLES CRITICAL RESULT CALLED TO, READ BACK BY AND VERIFIED WITH: PHARMD M RENZ [id number] AT 908 AM  BY CM Performed at [facility], [place] [address] [place], [place] [address]    Culture STREPTOCOCCUS GROUP F (A)  Final   Report Status 10/14/2019 FINAL  Final  Organism ID, Bacteria STREPTOCOCCUS GROUP F  Final      Susceptibility   Streptococcus group f - MIC*    PENICILLIN 0.25 INTERMEDIATE Intermediate     CEFTRIAXONE 1 SENSITIVE Sensitive     ERYTHROMYCIN <=0.12 SENSITIVE Sensitive     LEVOFLOXACIN 0.5 SENSITIVE Sensitive     VANCOMYCIN 0.5 SENSITIVE Sensitive     * STREPTOCOCCUS GROUP F  Blood Culture ID Panel (Reflexed)     Status: Abnormal   Collection Time: 10/11/19  7:15 AM  Result Value Ref Range Status   Enterococcus species NOT DETECTED NOT DETECTED Final   Listeria monocytogenes NOT DETECTED NOT DETECTED Final   Staphylococcus species NOT DETECTED NOT DETECTED Final   Staphylococcus aureus (BCID) NOT DETECTED NOT DETECTED Final   Streptococcus species DETECTED (A) NOT DETECTED Final    Comment: Not Enterococcus species, Streptococcus agalactiae, Streptococcus pyogenes, or Streptococcus pneumoniae. CRITICAL RESULT CALLED TO, READ BACK BY AND VERIFIED WITH: PHARMD M RENZ [id number] AT 75 BY CM    Streptococcus agalactiae NOT DETECTED NOT DETECTED Final   Streptococcus pneumoniae NOT DETECTED NOT DETECTED Final   Streptococcus pyogenes NOT DETECTED NOT DETECTED Final   Acinetobacter baumannii NOT DETECTED NOT DETECTED Final   Enterobacteriaceae species NOT DETECTED NOT DETECTED Final   Enterobacter cloacae complex NOT DETECTED NOT DETECTED Final   Escherichia coli NOT DETECTED NOT DETECTED Final   Klebsiella oxytoca NOT DETECTED NOT DETECTED Final   Klebsiella pneumoniae NOT DETECTED NOT DETECTED Final   Proteus species NOT DETECTED NOT DETECTED Final   Serratia marcescens NOT DETECTED NOT DETECTED Final   Haemophilus influenzae NOT DETECTED NOT DETECTED Final   Neisseria meningitidis NOT DETECTED NOT DETECTED Final   Pseudomonas aeruginosa NOT DETECTED NOT DETECTED Final   Candida albicans NOT DETECTED NOT DETECTED Final   Candida glabrata NOT DETECTED NOT DETECTED Final   Candida krusei NOT DETECTED NOT DETECTED Final    Candida parapsilosis NOT DETECTED NOT DETECTED Final   Candida tropicalis NOT DETECTED NOT DETECTED Final    Comment: Performed at [facility], [place]. [address], [place], [place] [address]  Urine culture     Status: None   Collection Time: 10/11/19  6:41 PM   Specimen: In/Out Cath Urine  Result Value Ref Range Status   Specimen Description   Final    IN/OUT CATH URINE Performed at [facility], [place] [address] [place], [place] [address]    Special Requests   Final    NONE Performed at [facility], [place] [address] [place], [place] [address]    Culture   Final    NO GROWTH Performed at [facility], [place] [address], [place], [place] [address]    Report Status 10/12/2019 FINAL  Final  Aerobic/Anaerobic Culture (surgical/deep wound)     Status: None (Preliminary result)   Collection Time: 10/12/19  8:58 AM   Specimen: Abscess  Result Value Ref Range Status   Specimen Description   Final    ABSCESS ABDOMEN Performed at [facility] [address] [place], [place] [address]    Special Requests   Final    NONE Performed at [facility], [place] [address], [place], [place] [address]    Gram Stain   Final    ABUNDANT WBC PRESENT, PREDOMINANTLY PMN MODERATE GRAM NEGATIVE COCCOBACILLI Performed at [facility], [place] [place] [address], [place],  [address]    Culture   Final  ABUNDANT VIRIDANS STREPTOCOCCUS FEW KLEBSIELLA PNEUMONIAE NO ANAEROBES ISOLATED; CULTURE IN PROGRESS FOR 5 DAYS    Report Status PENDING  Incomplete   Organism ID, Bacteria VIRIDANS STREPTOCOCCUS  Final   Organism ID, Bacteria KLEBSIELLA PNEUMONIAE  Final      Susceptibility   Klebsiella pneumoniae - MIC*    AMPICILLIN RESISTANT Resistant     CEFAZOLIN <=4 SENSITIVE Sensitive     CEFEPIME <=1 SENSITIVE Sensitive     CEFTAZIDIME <=1 SENSITIVE Sensitive     CEFTRIAXONE <=1 SENSITIVE Sensitive     CIPROFLOXACIN <=0.25  SENSITIVE Sensitive     GENTAMICIN <=1 SENSITIVE Sensitive     IMIPENEM 0.5 SENSITIVE Sensitive     TRIMETH/SULFA <=20 SENSITIVE Sensitive     AMPICILLIN/SULBACTAM 4 SENSITIVE Sensitive     PIP/TAZO <=4 SENSITIVE Sensitive     * FEW KLEBSIELLA PNEUMONIAE   Viridans streptococcus - MIC*    PENICILLIN 0.25 INTERMEDIATE Intermediate     CEFTRIAXONE 1 SENSITIVE Sensitive     ERYTHROMYCIN <=0.12 SENSITIVE Sensitive     LEVOFLOXACIN 0.5 SENSITIVE Sensitive     VANCOMYCIN 0.5 SENSITIVE Sensitive     * ABUNDANT VIRIDANS STREPTOCOCCUS  Culture, blood (routine x 2)     Status: None (Preliminary result)   Collection Time: 10/13/19  5:18 AM   Specimen: BLOOD  Result Value Ref Range Status   Specimen Description   Final    BLOOD RIGHT WRIST Performed at [facility], [place] [address] [place], [place] [address]    Special Requests   Final    BOTTLES DRAWN AEROBIC AND ANAEROBIC Blood Culture adequate volume Performed at [facility] [address], [place], [place] [address]    Culture   Final    NO GROWTH 2 DAYS Performed at [facility] [address], [place], [place] [address]    Report Status PENDING  Incomplete  Culture, blood (routine x 2)     Status: None (Preliminary result)   Collection Time: 10/13/19  5:18 AM   Specimen: BLOOD  Result Value Ref Range Status   Specimen Description   Final    BLOOD RIGHT ARM Performed at [facility] [address] [place], [place] [address]    Special Requests   Final    BOTTLES DRAWN AEROBIC AND ANAEROBIC Blood Culture adequate volume Performed at [facility] [address] [place], [place] [address]    Culture   Final    NO GROWTH 2 DAYS Performed at [facility] [address] [place], [place] [address]    Report Status PENDING  Incomplete         Radiology Studies: IR INT EXT BILIARY DRAIN WITH CHOLANGIOGRAM  Result Date:  10/13/2019 INDICATION: Metastatic pancreatic carcinoma status post Whipple. Worsening bilirubin with evidence of intrahepatic biliary ductal dilatation on recent cross-sectional imaging. EXAM: PERCUTANEOUS TRANSHEPATIC CHOLANGIOGRAM PERCUTANEOUS INTERNAL/EXTERNAL BILIARY DRAIN CATHETER PLACEMENT MEDICATIONS: Patient was already receiving adequate prophylactic antibiotic coverage as an inpatient. ANESTHESIA/SEDATION: Intravenous Fentanyl 147mg and Versed '2mg'$  were administered as conscious sedation during continuous monitoring of the patient's level of consciousness and physiological / cardiorespiratory status by the radiology RN, with a total moderate sedation time of 23 minutes. PROCEDURE: Informed written consent was obtained from the patient after a thorough discussion of the procedural risks, benefits and alternatives. All questions were addressed. Maximal Sterile Barrier Technique was utilized including caps, mask, sterile gowns, sterile gloves, sterile drape, hand hygiene and skin antiseptic.  A timeout was performed prior to the initiation of the procedure. Dilated intrahepatic biliary tree was identified on ultrasound and a peripheral right lower lobe duct was selected for approach. Skin and subcutaneous tissues down to the liver capsule infiltrated with 1% lidocaine. Under real-time ultrasound guidance, a 21 gauge trocar needle was advanced into the duct. 018 guidewire advanced centrally easily. Micro dilator was placed over the wire. Small contrast injection under fluoroscopy confirmed appropriate positioning. Micro dilator was exchanged over the 018 guidewire for a 5 [place] micropuncture dilator set. Contrast injection for percutaneous transhepatic cholangiogram was performed. Catheter exchanged over an angled Glidewire for a 5 French Kumpe catheter, advanced across the choledochojejunostomy. The Kumpe was exchanged over an Amplatz wire for vascular dilator which facilitated placement of a 10 French  internal/external biliary drain catheter, placed with sideholes spanning the central obstruction, across the CBD and into the choledochojejunostomy. Confirmatory contrast injection under fluoroscopy performed. The catheter was then flushed with saline and secured externally with 0 Prolene suture and StatLock, placed to external drain bag to maximize biliary decompression. The patient tolerated the procedure well. FLUOROSCOPY TIME:  2 minutes 30 seconds; 451 mGy COMPLICATIONS: None immediate. FINDINGS: The percutaneous transhepatic cholangiogram confirms dilated intrahepatic right biliary tree . Multiple segments of tapered narrowing near the biliary confluence with the CBD. The CBD is decompressed. The choledochojejunostomy is widely patent. 10 French internal/external biliary drainage catheter placed as above. IMPRESSION: 1. Multiple central regions of tapered intrahepatic biliary ductal stenosis likely secondary to the patient's known extensive hepatic metastatic disease. 2. Technically successful internal/external biliary drain catheter placement. Electronically Signed   By: Lucrezia Europe M.D.   On: 10/13/2019 16:47        Scheduled Meds: . sodium chloride   Intravenous Once  . sodium chloride   Intravenous Once  . B-complex with vitamin C  1 tablet Oral Daily  . Chlorhexidine Gluconate Cloth  6 each Topical Daily  . ferrous sulfate  325 mg Oral BID WC  . insulin aspart  0-5 Units Subcutaneous QHS  . insulin aspart  0-9 Units Subcutaneous TID WC  . lidocaine  1 patch Transdermal QHS  . lipase/protease/amylase  36,000 Units Oral TID AC  . phosphorus  250 mg Oral TID  . sodium chloride flush  10-40 mL Intracatheter Q12H  . sodium chloride flush  5 mL Intracatheter Q8H   Continuous Infusions: . sodium chloride 125 mL/hr at 10/15/19 1007  . cefTRIAXone (ROCEPHIN)  IV 2 g (10/14/19 2044)     LOS: 4 days    Time spent:     Shawna Clamp, MD [facility]   If 7PM-7AM, please  contact night-coverage

## 2019-10-15 NOTE — Progress Notes (Signed)
Per NT, patient refused CBG at lunchtime, states he just ate a late breakfast and knows result will not be accurate

## 2019-10-16 ENCOUNTER — Encounter (HOSPITAL_COMMUNITY): Payer: Self-pay | Admitting: Oncology

## 2019-10-16 ENCOUNTER — Other Ambulatory Visit: Payer: Self-pay | Admitting: Radiology

## 2019-10-16 DIAGNOSIS — C259 Malignant neoplasm of pancreas, unspecified: Secondary | ICD-10-CM

## 2019-10-16 LAB — COMPREHENSIVE METABOLIC PANEL
ALT: 52 U/L — ABNORMAL HIGH (ref 0–44)
AST: 35 U/L (ref 15–41)
Albumin: 1.4 g/dL — ABNORMAL LOW (ref 3.5–5.0)
Alkaline Phosphatase: 401 U/L — ABNORMAL HIGH (ref 38–126)
Anion gap: 7 (ref 5–15)
BUN: 8 mg/dL (ref 6–20)
CO2: 21 mmol/L — ABNORMAL LOW (ref 22–32)
Calcium: 7.5 mg/dL — ABNORMAL LOW (ref 8.9–10.3)
Chloride: 104 mmol/L (ref 98–111)
Creatinine, Ser: 0.69 mg/dL (ref 0.61–1.24)
GFR calc Af Amer: >60 mL/min (ref >60)
GFR calc non Af Amer: >60 mL/min (ref >60)
Glucose, Bld: 264 mg/dL — ABNORMAL HIGH (ref 70–99)
Potassium: 3.9 mmol/L (ref 3.5–5.1)
Sodium: 132 mmol/L — ABNORMAL LOW (ref 135–145)
Total Bilirubin: 10.2 mg/dL — ABNORMAL HIGH (ref 0.3–1.2)
Total Protein: 5.4 g/dL — ABNORMAL LOW (ref 6.5–8.1)

## 2019-10-16 LAB — CBC
HCT: 24.7 % — ABNORMAL LOW (ref 39.0–52.0)
Hemoglobin: 7.9 g/dL — ABNORMAL LOW (ref 13.0–17.0)
MCH: 27.1 pg (ref 26.0–34.0)
MCHC: 32 g/dL (ref 30.0–36.0)
MCV: 84.9 fL (ref 80.0–100.0)
Platelets: 176 10*3/uL (ref 150–400)
RBC: 2.91 MIL/uL — ABNORMAL LOW (ref 4.22–5.81)
RDW: 19 % — ABNORMAL HIGH (ref 11.5–15.5)
WBC: 9.3 10*3/uL (ref 4.0–10.5)
nRBC: 0 % (ref 0.0–0.2)

## 2019-10-16 LAB — HEMOGLOBIN AND HEMATOCRIT, BLOOD
HCT: 25.5 % — ABNORMAL LOW (ref 39.0–52.0)
Hemoglobin: 8.3 g/dL — ABNORMAL LOW (ref 13.0–17.0)

## 2019-10-16 LAB — OCCULT BLOOD X 1 CARD TO LAB, STOOL
Fecal Occult Bld: NEGATIVE
Fecal Occult Bld: POSITIVE — AB

## 2019-10-16 LAB — GLUCOSE, CAPILLARY
Glucose-Capillary: 247 mg/dL — ABNORMAL HIGH (ref 70–99)
Glucose-Capillary: 330 mg/dL — ABNORMAL HIGH (ref 70–99)

## 2019-10-16 LAB — MAGNESIUM: Magnesium: 1.4 mg/dL — ABNORMAL LOW (ref 1.7–2.4)

## 2019-10-16 LAB — PHOSPHORUS: Phosphorus: 2.5 mg/dL (ref 2.5–4.6)

## 2019-10-16 MED ORDER — PANTOPRAZOLE SODIUM 40 MG PO TBEC
40.0000 mg | DELAYED_RELEASE_TABLET | Freq: Two times a day (BID) | ORAL | Status: DC
  Administered 2019-10-16: 40 mg via ORAL
  Filled 2019-10-16: qty 1

## 2019-10-16 MED ORDER — PANTOPRAZOLE SODIUM 40 MG PO TBEC: 40.0000 mg | DELAYED_RELEASE_TABLET | Freq: Two times a day (BID) | ORAL | 0 refills | Status: DC

## 2019-10-16 MED ORDER — HEPARIN SOD (PORK) LOCK FLUSH 100 UNIT/ML IV SOLN
500.0000 [IU] | INTRAVENOUS | Status: AC | PRN
  Administered 2019-10-16: 500 [IU]
  Filled 2019-10-16: qty 5

## 2019-10-16 MED ORDER — CHLORHEXIDINE GLUCONATE CLOTH 2 % EX PADS: 6.0000 | MEDICATED_PAD | Freq: Every day | CUTANEOUS | 0 refills | Status: AC

## 2019-10-16 MED ORDER — AMOXICILLIN-POT CLAVULANATE 875-125 MG PO TABS: 1.0000 | ORAL_TABLET | Freq: Two times a day (BID) | ORAL | 0 refills | Status: AC

## 2019-10-16 MED ORDER — ACETAMINOPHEN 325 MG PO TABS: 650.0000 mg | ORAL_TABLET | Freq: Three times a day (TID) | ORAL | Status: AC | PRN

## 2019-10-16 NOTE — Progress Notes (Addendum)
HEMATOLOGY-ONCOLOGY PROGRESS NOTE  SUBJECTIVE: No recurrent fevers or chills.  Repeat blood cultures are negative to date.  The patient states that he has to lay on his back because he not comfortable laying on the side secondary to his drains.  Other than laying on his sides, he has no discomfort at the drainage insertion site.  The patient states that he wants to go home and that he is tired of being in the hospital.  He has no other complaints this morning.  Oncology History  Cancer of head of pancreas ([place])  01/26/2019 Initial Diagnosis   Cancer of head of pancreas ([place])   01/26/2019 Cancer Staging   Staging form: Exocrine Pancreas, AJCC 8th Edition - Clinical: Stage Unknown (cTX, cN0, cM0) - Signed by Ladell Pier, MD on 01/26/2019   02/06/2019 - 06/19/2019 Chemotherapy   The patient had palonosetron (ALOXI) injection 0.25 mg, 0.25 mg, Intravenous,  Once, 8 of 8 cycles Administration: 0.25 mg (02/06/2019), 0.25 mg (02/20/2019), 0.25 mg (03/09/2019), 0.25 mg (03/21/2019), 0.25 mg (04/05/2019), 0.25 mg (04/24/2019), 0.25 mg (05/22/2019), 0.25 mg (06/06/2019) pegfilgrastim-cbqv (UDENYCA) injection 6 mg, 6 mg, Subcutaneous, Once, 2 of 2 cycles Administration: 6 mg (02/22/2019) irinotecan (CAMPTOSAR) 340 mg in dextrose 5 % 500 mL chemo infusion, 150 mg/m2 = 340 mg (100 % of original dose 150 mg/m2), Intravenous,  Once, 8 of 8 cycles Dose modification: 150 mg/m2 (original dose 150 mg/m2, Cycle 1, Reason: Provider Judgment), 120 mg/m2 (original dose 150 mg/m2, Cycle 3, Reason: Provider Judgment) Administration: 340 mg (02/06/2019), 340 mg (02/20/2019), 260 mg (03/09/2019), 260 mg (03/21/2019), 260 mg (04/05/2019), 260 mg (04/24/2019), 260 mg (05/22/2019), 260 mg (06/06/2019) leucovorin 892 mg in dextrose 5 % 250 mL infusion, 400 mg/m2 = 892 mg, Intravenous,  Once, 8 of 8 cycles Dose modification: 300 mg/m2 (original dose 400 mg/m2, Cycle 3, Reason: Provider Judgment) Administration: 892 mg (02/06/2019), 892 mg (02/20/2019),  670 mg (03/21/2019), 670 mg (03/09/2019), 670 mg (04/05/2019), 642 mg (04/24/2019), 642 mg (05/22/2019), 642 mg (06/06/2019) oxaliplatin (ELOXATIN) 200 mg in dextrose 5 % 500 mL chemo infusion, 190 mg, Intravenous,  Once, 8 of 8 cycles Dose modification: 65 mg/m2 (original dose 85 mg/m2, Cycle 6, Reason: Provider Judgment) Administration: 200 mg (02/06/2019), 200 mg (02/20/2019), 200 mg (03/09/2019), 200 mg (03/21/2019), 200 mg (04/05/2019), 140 mg (04/24/2019), 140 mg (05/22/2019), 140 mg (06/06/2019) fosaprepitant (EMEND) 150 mg, dexamethasone (DECADRON) 10 mg in sodium chloride 0.9 % 145 mL IVPB, , Intravenous,  Once, 8 of 8 cycles Administration:  (02/06/2019),  (02/20/2019),  (03/09/2019),  (03/21/2019),  (04/05/2019),  (04/24/2019),  (05/22/2019),  (06/06/2019) fluorouracil (ADRUCIL) 5,350 mg in sodium chloride 0.9 % 143 mL chemo infusion, 2,400 mg/m2 = 5,350 mg, Intravenous, 1 Day/Dose, 8 of 8 cycles Dose modification: 1,800 mg/m2 (original dose 2,400 mg/m2, Cycle 3, Reason: Provider Judgment) Administration: 5,350 mg (02/06/2019), 5,350 mg (02/20/2019), 4,000 mg (03/09/2019), 4,000 mg (03/21/2019), 4,000 mg (04/05/2019), 3,850 mg (04/24/2019), 3,850 mg (05/22/2019), 3,850 mg (06/06/2019)  for chemotherapy treatment.    05/09/2019 Genetic Testing   Negative genetic testing on the CustomNext-Cancer+RNAinsight.  The CustomNext-Expanded gene panel offered by [facility] and includes sequencing and rearrangement analysis for the following 81 genes: AIP, ALK, APC*, ATM*, AXIN2, BAP1, BARD1, BLM, BMPR1A, BRCA1*, BRCA2*, BRIP1*, CDC73, CDH1*, CDK4, CDKN1B, CDKN2A, CHEK2*, CTNNA1, DICER1, FANCC, FH, FLCN, GALNT12, HOXB13, KIT, MAX, MEN1, MET, MLH1*, MRE11A, MSH2*, MSH6*, MUTYH*, NBN, NF1*, NF2, NTHL1, PALB2*, PDGFRA, PHOX2B, PMS2*, POLD1, POLE, POT1, PRKAR1A, PTCH1, PTEN*, RAD50, RAD51C*, RAD51D*, RB1, RET,  SDHA, SDHAF2, SDHB, SDHC, SDHD, SMAD4, SMARCA4, SMARCB1, SMARCE1, STK11, SUFU, TMEM127, TP53*, TSC1, TSC2, VHL and XRCC2 (sequencing and  deletion/duplication); CASR, CFTR, CPA1, CTRC, EGFR, MITF, PRSS1 and SPINK1 (sequencing only); EPCAM and GREM1 (deletion/duplication only). DNA and RNA analyses performed for * genes. The report date is May 09, 2019.   10/09/2019 -  Chemotherapy   The patient had PACLitaxel-protein bound (ABRAXANE) chemo infusion 200 mg, 100 mg/m2 = 200 mg (100 % of original dose 100 mg/m2), Intravenous,  Once, 0 of 4 cycles Dose modification: 100 mg/m2 (original dose 100 mg/m2, Cycle 1, Reason: Provider Judgment) gemcitabine (GEMZAR) 1,672 mg in sodium chloride 0.9 % 250 mL chemo infusion, 800 mg/m2 = 1,672 mg (100 % of original dose 800 mg/m2), Intravenous,  Once, 0 of 4 cycles Dose modification: 800 mg/m2 (original dose 800 mg/m2, Cycle 1, Reason: Provider Judgment)  for chemotherapy treatment.    Pancreatic cancer ([place])  07/20/2019 Initial Diagnosis   Pancreatic cancer ([place])   10/09/2019 -  Chemotherapy   The patient had PACLitaxel-protein bound (ABRAXANE) chemo infusion 200 mg, 100 mg/m2 = 200 mg (100 % of original dose 100 mg/m2), Intravenous,  Once, 0 of 4 cycles Dose modification: 100 mg/m2 (original dose 100 mg/m2, Cycle 1, Reason: Provider Judgment) gemcitabine (GEMZAR) 1,672 mg in sodium chloride 0.9 % 250 mL chemo infusion, 800 mg/m2 = 1,672 mg (100 % of original dose 800 mg/m2), Intravenous,  Once, 0 of 4 cycles Dose modification: 800 mg/m2 (original dose 800 mg/m2, Cycle 1, Reason: Provider Judgment)  for chemotherapy treatment.       PHYSICAL EXAMINATION:  Vitals:   10/15/19 2105 10/16/19 0632  BP: 124/81 131/74  Pulse: 97 82  Resp: 16 20  Temp: 98.4 F (36.9 C) 98.2 F (36.8 C)  SpO2: 99% 97%   Filed Weights   10/12/19 1506  Weight: 193 lb 2 oz (87.6 kg)    Intake/Output from previous day: 12/13 0701 - 12/14 0700 In: 3321.6 [P.O.:240; I.V.:2620.6; Blood:346; IV Piggyback:100] Out: 71 [Urine:400; Drains:275]  GENERAL: Awake and alert, no distress SKIN: Jaundice noted LUNGS:  clear to auscultation and percussion with normal breathing effort HEART: regular rate & rhythm and no murmurs and pitting bilateral lower extremity edema ABDOMEN: Biliary drain in place.,  Fullness throughout the right upper abdomen, midline incision is healed with no surrounding erythema Musculoskeletal:no cyanosis of digits and no clubbing  NEURO: alert & oriented x 3 with fluent speech, no focal motor/sensory deficits Vascular: Edema at the lower leg bilaterally  Port-A-Cath without erythema  LABORATORY DATA:  I have reviewed the data as listed CMP Latest Ref Rng & Units 10/16/2019 10/15/2019 10/14/2019  Glucose 70 - 99 mg/dL 264(H) 211(H) 221(H)  BUN 6 - 20 mg/dL _0 Creatinine 0.61 - 1.24 mg/dL 0.69 0.72 0.72  Sodium 135 - 145 mmol/L 132(L) 131(L) 132(L)  Potassium 3.5 - 5.1 mmol/L 3.9 3.7 4.0  Chloride 98 - 111 mmol/L 104 103 103  CO2 22 - 32 mmol/L 21(L) 21(L) 21(L)  Calcium 8.9 - 10.3 mg/dL 7.5(L) 7.5(L) 7.7(L)  Total Protein 6.5 - 8.1 g/dL 5.4(L) 5.4(L) 5.4(L)  Total Bilirubin 0.3 - 1.2 mg/dL 10.2(H) 9.6(H) 8.2(H)  Alkaline Phos 38 - 126 U/L 401(H) 516(H) 615(H)  AST 15 - 41 U/L 35 39 81(H)  ALT 0 - 44 U/L 52(H) 69(H) 100(H)    Lab Results  Component Value Date   WBC 9.3 10/16/2019   HGB 7.9 (L) 10/16/2019   HCT 24.7 (L) 10/16/2019  MCV 84.9 10/16/2019   PLT 176 10/16/2019   NEUTROABS 8.6 (H) 10/13/2019    CT Angio Chest PE W and/or Wo Contrast  Result Date: 10/11/2019 CLINICAL DATA:  66m omni350 Pt had whipple procedure done in sept 2020. Pt was to start chemo today. Bilirubin level is going up. Denies any chest pain or chest pressure. Pt tates has a rapid heart rate. eval for pE EXAM: CT ANGIOGRAPHY CHEST WITH CONTRAST TECHNIQUE: Multidetector CT imaging of the chest was performed using the standard protocol during bolus administration of intravenous contrast. Multiplanar CT image reconstructions and MIPs were obtained to evaluate the vascular anatomy.  CONTRAST:  730mOMNIPAQUE IOHEXOL 350 MG/ML SOLN COMPARISON:  CT abdomen pelvis 10/10/2019 FINDINGS: Cardiovascular: No filling defects within the pulmonary arteries to suggest acute pulmonary embolism. No acute findings of the aorta or great vessels. No pericardial fluid. Mediastinum/Nodes: No axillary supraclavicular adenopathy. No mediastinal hilar adenopathy. No pericardial effusion. Port in the anterior chest wall with tip in distal SVC. Lungs/Pleura: No suspicious pulmonary nodules. No infiltrate Upper Abdomen: Innumerable hepatic metastasis again noted. Musculoskeletal: No aggressive osseous lesion Review of the MIP images confirms the above findings. IMPRESSION: 1. No evidence acute pulmonary embolism. 2. No acute pulmonary parenchymal findings. 3. Innumerable hepatic metastasis. Electronically Signed   By: StSuzy Bouchard.D.   On: 10/11/2019 09:50   CT ABDOMEN PELVIS W CONTRAST  Result Date: 10/13/2019 CLINICAL DATA:  [age]ear old with history of metastatic pancreatic cancer and status post Whipple. History of intra-abdominal abscess and recent percutaneous drain placement in a perihepatic abscess on 10/12/2019. EXAM: CT ABDOMEN AND PELVIS WITH CONTRAST TECHNIQUE: Multidetector CT imaging of the abdomen and pelvis was performed using the standard protocol following bolus administration of intravenous contrast. CONTRAST:  10048mMNIPAQUE IOHEXOL 300 MG/ML  SOLN COMPARISON:  CT 10/10/2019 FINDINGS: Lower chest: Again noted is a poorly defined pleural-based density in the right lower lobe on sequence 6, image 14. Dependent densities are most compatible with atelectasis. Interval development of trace bilateral pleural effusions. Hepatobiliary: Again noted are innumerable hypodensities throughout the liver compatible with metastatic disease. Index lesion near the central aspect of the liver measures up to 2.9 cm. Small amount of perihepatic ascites has increased from the recent examination. Postsurgical  changes compatible with Whipple procedure and hepatojejunostomy. Poorly defined structures in the central aspect of the liver related to the surgical anastomosis and liver lesions in this area. Evidence for areas of segmental intrahepatic biliary dilatation. Persistent intrahepatic biliary dilatation in the right hepatic lobe on sequence 2, image 29. Limited evaluation of the central biliary structures due to the prior surgery. Pancreas: Post Whipple.  Remaining pancreas is not well visualized. Spleen: Stable appearance of the spleen. Increased perisplenic ascites. Adrenals/Urinary Tract: Normal appearance of both adrenal glands. Negative for hydronephrosis. Evidence for a cortical cyst in the right kidney upper pole. No suspicious renal lesions. Mild distention of the urinary bladder. Stomach/Bowel: Colonic diverticula. Previous examination demonstrated abnormal tissue or soft tissue thickening near the hepatic flexure of the colon but this area is poorly characterized without oral contrast. No evidence for a bowel obstruction. Evidence for a gastrojejunostomy related to the Whipple procedure. Vascular/Lymphatic: Atherosclerotic calcifications in the abdominal aorta without aneurysm. There are probably abnormal lymph nodes in the central upper abdomen and porta hepatis region but poorly characterized on this examination. There may be narrowing in the main portal vein on sequence 2, image 38 which could be secondary to lymphadenopathy. Main portal vein is poorly characterized on  this examination but portal vein is patent. Reproductive: Stable appearance of the prostate. Other: Small amount ascites in the pelvis is new. Slightly increased ascites in the abdomen with some mesenteric edema. The perihepatic fluid collection from the previous examination has resolved with the percutaneous drain. Percutaneous drain is positioned the perihepatic space and similar to the placement images. Prior examination raised concern  for tumor or abscess collection near the hepatic flexure of the colon. There is no longer a definite fluid collection adjacent to the hepatic flexure. There may be a small connection between the percutaneous drain and where the previous hepatic flexure collection was located, suggesting this hepatic flexure collection has been decompressed with the percutaneous drain. No new or additional intra-abdominal abscess collections. Musculoskeletal: No acute bone abnormality. IMPRESSION: 1. Perihepatic abscess collection has resolved with the percutaneous drain. In addition, the collection adjacent to the hepatic flexure of the colon has either resolved or decompressed in the interim and probably related to the percutaneous drain. However, the hepatic flexure area is poorly characterized on this examination. 2. Innumerable liver lesions compatible with metastatic disease. In addition, there are areas of intrahepatic biliary dilatation likely related to obstruction from liver lesions. Biliary dilatation is likely segmental and scattered throughout the liver. 3. Increased ascites in the abdomen and pelvis. Overall ascites volume is small. Trace pleural fluid has developed. Electronically Signed   By: Markus Daft M.D.   On: 10/13/2019 12:37   CT Abdomen Pelvis W Contrast  Result Date: 10/10/2019 CLINICAL DATA:  58 year old male with history of pancreatic cancer diagnosed in February 2020. Elevated bilirubin. Evaluate for potential biliary obstruction. EXAM: CT ABDOMEN AND PELVIS WITH CONTRAST TECHNIQUE: Multidetector CT imaging of the abdomen and pelvis was performed using the standard protocol following bolus administration of intravenous contrast. CONTRAST:  148m OMNIPAQUE IOHEXOL 300 MG/ML  SOLN COMPARISON:  CT the abdomen and pelvis 08/24/2019. Abdominal MRI 09/05/2019. FINDINGS: Lower chest: Unremarkable. Hepatobiliary: Marked increase in number and size of what are now innumerable low-intermediate attenuation lesions  scattered throughout the hepatic parenchyma, largest of which measure up to 3.6 x 2.4 cm in segment 4B of the liver (axial image 37 of series 3). Some of these lesions appear elongated within the hepatic parenchyma best appreciated on sagittal image 53 of series 7, where that length of the lesion previously described measures up to 11.6 cm. These lesions are exerting local mass effect upon adjacent structures causing compression of central bile ducts resulting in mild to moderate intrahepatic biliary ductal dilatation throughout the right lobe of the liver. Status post cholecystectomy is part of Whipple procedure. In the gallbladder fossa there is a large amount of intermediate attenuation fluid and/or soft tissue which has a thick rim of peripheral enhancement estimated to measure approximately 6.1 x 6.6 x 3.5 cm (axial image 43 of series 3 and coronal image 33 of series 6). This soft tissue appears intimately associated both with the undersurface of the liver, as well as several adjacent small bowel loops, in close proximity to the presumed location of the hepaticojejunostomy. Previously noted fluid collection beneath the right lobe of the liver (which previously had a percutaneous drainage catheter on prior CT 08/24/2019) is again noted on today's examination, currently measuring 5.7 x 5.3 x 6.9 cm (axial image 51 of series 3 and coronal image 34 of series 6) with thick peripheral rim of soft tissue and enhancement which appears to partially extend into the right oblique musculature best appreciated on axial image 47 of series  3 and coronal image 33 of series 6). Pancreas: Postoperative changes of Whipple procedure. Spleen: Unremarkable. Adrenals/Urinary Tract: 1.5 cm low-attenuation lesion in the upper pole of the right kidney, compatible with a simple cyst. Left kidney and bilateral adrenal glands are normal in appearance. No hydroureteronephrosis. Urinary bladder is normal in appearance. Stomach/Bowel:  Postoperative changes of Whipple procedure. Loops of small bowel and colon traversing beneath the right lobe of the liver appear thickened as they come in close association with the previously noted fluid and/or soft tissue collection in this region. No pathologic dilatation of small bowel or colon. Normal appendix. Vascular/Lymphatic: Aortic atherosclerosis, without evidence of aneurysm or dissection in the abdominal or pelvic vasculature. Small of filling defect in the proximal portal vein (axial image 36 of series 3), concerning for nonobstructive thrombus. Enlarged lymph node immediately anterior to the superior mesenteric vein (axial image 43 of series 3) measuring 1.5 cm in short axis. Reproductive: Prostate gland and seminal vesicles are unremarkable in appearance. Other: Trace volume of perihepatic ascites.  No pneumoperitoneum. Musculoskeletal: There are no aggressive appearing lytic or blastic lesions noted in the visualized portions of the skeleton. IMPRESSION: 1. Marked increased number and size of innumerable hepatic lesions, as well as persistence of right infra hepatic postoperative fluid collection and development of an additional fluid and/or soft tissue collection beneath the gallbladder fossa. Overall, the findings are favored to reflect progressive metastatic disease to the liver, however, there is overlap with potential infectious etiologies (multiple intrahepatic abscesses and subhepatic abscesses). Guided sampling of one or more of these lesions is suggested to establish a definitive diagnosis. 2. 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 3. Interval development of mildly enlarged mesenteric lymph node measuring 1.5 cm in short axis immediately anterior to the portal vein, suspicious for a metastatic lymph node, although reactive lymphadenopathy in the setting of extensive infection is not excluded. 4. Additional incidental findings, as above. Electronically Signed   By:  Vinnie Langton M.D.   On: 10/10/2019 17:34   DG Chest Port 1 View  Result Date: 10/11/2019 CLINICAL DATA:  [place]. Additional history provided: Syncopal episode, history of liver CA. EXAM: PORTABLE CHEST 1 VIEW COMPARISON:  CT angiogram chest 08/07/2019, chest radiograph 08/07/2011 FINDINGS: Left chest infusion port catheter with tip projecting in the region of the superior cavoatrial junction. Heart size within normal limits. No airspace consolidation within the lungs. No evidence of pleural effusion or pneumothorax. No acute bony abnormality. IMPRESSION: No evidence of acute cardiopulmonary abnormality. Electronically Signed   By: Kellie Simmering DO   On: 10/11/2019 07:57   [place] CORE BIOPSY (LIVER)  Result Date: 09/26/2019 INDICATION: History of pancreatic carcinoma with development multiple small liver lesions. EXAM: ULTRASOUND GUIDED CORE BIOPSY OF LIVER MEDICATIONS: None. ANESTHESIA/SEDATION: Fentanyl 100 mcg IV; Versed 4.0 mg IV Moderate Sedation Time:  20 minutes. The patient was continuously monitored during the procedure by the interventional radiology nurse under my direct supervision. PROCEDURE: The procedure, risks, benefits, and alternatives were explained to the patient. Questions regarding the procedure were encouraged and answered. The patient understands and consents to the procedure. A time-out was performed prior to initiating the procedure. Ultrasound was performed of the liver. The abdominal wall was prepped with chlorhexidine in a sterile fashion, and a sterile drape was applied covering the operative field. A sterile gown and sterile gloves were used for the procedure. Local anesthesia was provided with 1% Lidocaine. After choosing lesions for sampling, a 17 gauge trocar needle was advanced into  the liver. After confirming needle tip position, coaxial 18 gauge core biopsy samples were obtained. Three intact core biopsy samples were submitted in formalin. Gel-Foam pledgets were advanced  through the outer needle as it was retracted. Additional ultrasound was performed. COMPLICATIONS: None immediate. FINDINGS: Ultrasound demonstrates numerous solid hypoechoic rounded lesions throughout the liver parenchyma. The largest measures approximately 1.9 cm within the inferior right lobe near the juncture with the left lobe. Lesions have likely increased in size and number since the MRI study on 09/05/2019 and are suspicious for metastatic disease by imaging. Solid tissue was obtained with biopsy. IMPRESSION: Ultrasound-guided core biopsy performed adjacent hypoechoic solid lesions within the inferior right lobe of the liver. The largest measures approximately 1.9 cm and lesions have likely progressed since the MRI study on 09/05/2019. Electronically Signed   By: Aletta Edouard M.D.   On: 09/26/2019 14:49   IR INT EXT BILIARY DRAIN WITH CHOLANGIOGRAM  Result Date: 10/13/2019 INDICATION: Metastatic pancreatic carcinoma status post Whipple. Worsening bilirubin with evidence of intrahepatic biliary ductal dilatation on recent cross-sectional imaging. EXAM: PERCUTANEOUS TRANSHEPATIC CHOLANGIOGRAM PERCUTANEOUS INTERNAL/EXTERNAL BILIARY DRAIN CATHETER PLACEMENT MEDICATIONS: Patient was already receiving adequate prophylactic antibiotic coverage as an inpatient. ANESTHESIA/SEDATION: Intravenous Fentanyl 176mg and Versed 279mwere administered as conscious sedation during continuous monitoring of the patient's level of consciousness and physiological / cardiorespiratory status by the radiology RN, with a total moderate sedation time of 23 minutes. PROCEDURE: Informed written consent was obtained from the patient after a thorough discussion of the procedural risks, benefits and alternatives. All questions were addressed. Maximal Sterile Barrier Technique was utilized including caps, mask, sterile gowns, sterile gloves, sterile drape, hand hygiene and skin antiseptic. A timeout was performed prior to the  initiation of the procedure. Dilated intrahepatic biliary tree was identified on ultrasound and a peripheral right lower lobe duct was selected for approach. Skin and subcutaneous tissues down to the liver capsule infiltrated with 1% lidocaine. Under real-time ultrasound guidance, a 21 gauge trocar needle was advanced into the duct. 018 guidewire advanced centrally easily. Micro dilator was placed over the wire. Small contrast injection under fluoroscopy confirmed appropriate positioning. Micro dilator was exchanged over the 018 guidewire for a 5 Fr[place]ropuncture dilator set. Contrast injection for percutaneous transhepatic cholangiogram was performed. Catheter exchanged over an angled Glidewire for a 5 French Kumpe catheter, advanced across the choledochojejunostomy. The Kumpe was exchanged over an Amplatz wire for vascular dilator which facilitated placement of a 10 French internal/external biliary drain catheter, placed with sideholes spanning the central obstruction, across the CBD and into the choledochojejunostomy. Confirmatory contrast injection under fluoroscopy performed. The catheter was then flushed with saline and secured externally with 0 Prolene suture and StatLock, placed to external drain bag to maximize biliary decompression. The patient tolerated the procedure well. FLUOROSCOPY TIME:  2 minutes 30 seconds; 11419Gy COMPLICATIONS: None immediate. FINDINGS: The percutaneous transhepatic cholangiogram confirms dilated intrahepatic right biliary tree . Multiple segments of tapered narrowing near the biliary confluence with the CBD. The CBD is decompressed. The choledochojejunostomy is widely patent. 10 French internal/external biliary drainage catheter placed as above. IMPRESSION: 1. Multiple central regions of tapered intrahepatic biliary ductal stenosis likely secondary to the patient's known extensive hepatic metastatic disease. 2. Technically successful internal/external biliary drain catheter  placement. Electronically Signed   By: D Lucrezia Europe.D.   On: 10/13/2019 16:47   CT IMAGE GUIDED DRAINAGE BY PERCUTANEOUS CATHETER  Result Date: 10/12/2019 INDICATION: [id number] old male with a history of right upper quadrant abscess  EXAM: CT GUIDED DRAINAGE OF  ABSCESS MEDICATIONS: The patient is currently admitted to the hospital and receiving intravenous antibiotics. The antibiotics were administered within an appropriate time frame prior to the initiation of the procedure. ANESTHESIA/SEDATION: 2.0 mg IV Versed 100 mcg IV Fentanyl Moderate Sedation Time:  12 minutes The patient was continuously monitored during the procedure by the interventional radiology nurse under my direct supervision. COMPLICATIONS: None TECHNIQUE: Informed written consent was obtained from the patient after a thorough discussion of the procedural risks, benefits and alternatives. All questions were addressed. Maximal Sterile Barrier Technique was utilized including caps, mask, sterile gowns, sterile gloves, sterile drape, hand hygiene and skin antiseptic. A timeout was performed prior to the initiation of the procedure. PROCEDURE: The operative field was prepped with Chlorhexidine in a sterile fashion, and a sterile drape was applied covering the operative field. A sterile gown and sterile gloves were used for the procedure. Local anesthesia was provided with 1% Lidocaine. Scout CT was acquired. The patient was prepped and draped in usual sterile fashion. 1% lidocaine was used for local anesthesia. Using CT guidance, guide needle was advanced into the abscess of the right upper quadrant. Modified Seldinger technique was used to place a 10 [place] drain. 30 cc of purulent material aspirated for a culture. Drain was attached to bulb suction and sutured in position. Patient tolerated the procedure well and remained hemodynamically stable throughout. No complications were encountered and no significant blood loss. FINDINGS: Scout CT  demonstrates abscess of the right upper quadrant, sub a pathic. Final CT demonstrates drainage catheter within the abscess. Culture of 30 cc purulent material sent to the lab. IMPRESSION: Status post CT-guided drainage of right upper quadrant abscess. Signed, Dulcy Fanny. Dellia Nims, RPVI [facility] Radiology Electronically Signed   By: Corrie Mckusick D.O.   On: 10/12/2019 09:37    ASSESSMENT AND PLAN: 1. Pancreas head mass  Presented with jaundice, bilirubin elevated at 21.4  CTs abdomen/pelvis 12/20/2018-severe intrahepatic and extrahepatic biliary dilatation due to a possible 3.5 x 2.5 cm solid mass in the pancreatic head. Pancreatic ductal dilatation noted as well with multiple cystic lesions in the pancreatic tail and body.   CT chest 12/20/2018-occasional nonspecific small pulmonary nodules felt to likely be related to prior infection or inflammation.   ERCP by Dr. Watt Climes 12/21/2018. The major papilla was on the rim of a diverticulum. The minor papilla appeared to be bulging. A biopsy of the duodenum adjacent to the minor papilla was performed. Plastic stent was placed into the ventral pancreatic duct. Biliary sphincterotomy performed. Covered metal stent placed into the common bile duct. Bile duct brushings showed benign reactive/reparative changes. Biopsy of polypoid duodenal mucosa suggestive of nodular peptic duodenitis, negative for dysplasia or malignancy.   Upper EUS by Dr. Paulita Fujita on 01/03/2019 showed a few cystic lesions in the pancreatic body and pancreatic tail. A stent was visualized in the common bile duct. Mass identified in the pancreatic head with fine-needle aspiration performed. Lesion appeared to abut or potentially superficially invade the superior mesenteric vein. No lymphadenopathy. No involvement of the superior mesenteric artery or celiac artery. Pathology returned suspicious for malignancy. Dr. Paulita Fujita notes if  pathology shows adenocarcinoma the lesion would be staged T3 N0 MX.  Repeat ERCP and placement of a metal bile duct stent 01/24/2019  EUS 01/24/2019, T3N0 pancreas head mass with abutment of the SMV, FNA of the pancreas mass revealed adenocarcinoma, cytology from the bile duct stent revealed adenocarcinoma  Cycle  1 FOLFIRINOX4/04/2019  Cycle 2 FOLFIRINOX4/20/2020, Udenyca added  Cycle 3 FOLFIRINOX 03/09/2019, Irinotecan and 5-fluorouracil dose reduced, Udenyca held  Cycle 4 FOLFIRINOX 03/21/2019-Udenyca held  Cycle 5 FOLFIRINOX6/01/2019  Cycle 6 FOLFIRINOX 04/24/2019 (oxaliplatin dose reduced due to poor tolerance of chemotherapy)  CT 05/11/2019-decreased size of pancreas head mass, unchanged peripancreatic lymph node, no evidence of metastatic disease, improved/resolved fluid collections at the pancreas tail  Cycle 7 FOLFIRINOX 05/22/2019  Cycle 8 FOLFIRINOX 06/06/2019  07/20/2019 pancreaticoduodenectomy 07/20/2019-pT2, pN0 (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  Treatment effect absent, extensive residual cancer with no evident tumor regression)  MRI abdomen 09/05/2019-reaccumulation of fluid along the perihepatic margin following presumed removal of the pigtail drainage catheter that was present in this area on the study of 08/24/2019; some fluid in the region of the porta hepatis and pancreaticobiliary limb; small collection of fluid just inferior to the interlobar fissure in the liver along the course of the previous drain; tract extending from this area to the midline abdominal wound; innumerable areas of abnormality seen on T2 and diffusion throughout the liver, largest in the range of 1 cm.  All areas of the liver affected, both right and left hepatic  lobes.  Along the gallbladder fossa is a peripherally enhancing 1.8 x 1.3 cm focus.  Ultrasound-guided biopsy of a liver lesion 09/26/2019-adenocarcinoma compatible with pancreatic adenocarcinoma 2. Biliary obstruction status post stent placement 3. History of left lower extremity DVT 2017, 2018-on Eliquis. 4. Type 2 diabetes 5. Hypertension 6. Chronic kidney disease 7. Tobacco use 8. Anemia, macrocytic. Transfused 2 units of blood 12/20/2018.On oral iron. Improved 02/01/2019 and 02/06/2019. 9. Port-A-Cath placement 02/02/2019, Dr. Barry Dienes 10. 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 Hospitalized 08/07/2019 through 08/11/2019 with postsurgical intra-abdominal fluid collection.  CT 08/07/2019 with 2 rim-enhancing fluid collections in the right upper quadrant concerning for abscesses, moderate wall thickening and pericolonic inflammatory changes involving the distal ascending, transverse and descending colon consistent with colitis.  Blood culture 08/07/2019 + for Prevotella buccae beta-lactamase positive; status post CT-guided aspiration of intra-abdominal fluid collection and drain placement on 08/09/2019, culture with multiple organisms present, none predominant; follow-up CT 08/24/2019 with resolution of dominant fluid collection within the right upper abdominal quadrant, tiny serpiginous fluid collection within the ventral aspect of the right upper abdomen, interval development of multiple ill-defined hypoattenuating liver lesions; MRI abdomen 09/05/2019 reaccumulation of fluid along the perihepatic margin following presumed removal of the pigtail drainage catheter that was present in this area on the study of 08/24/2019; some fluid in the region of the porta hepatis and pancreaticobiliary limb; small collection of fluid just inferior to the interlobar fissure in the liver along the course of the previous drain; tract extending  from this area to the midline abdominal wound; innumerable areas of abnormality seen on T2 and diffusion throughout the liver, largest in the range of 1 cm.  All areas of the liver affected, both right and left hepatic lobes.  Along the gallbladder fossa is a peripherally enhancing 1.8 x 1.3 cm focus. [facility] admission 10/11/2019-sepsis, blood cultures positive for a Streptococcus species, likely source is right upper quadrant abscess 14. Hyperbilirubinemia-status post placement of a right biliary system drain 10/13/2019  Brian Torres appears stable.  Initial blood cultures positive for Streptococcus.  Repeat blood cultures  are negative to date.  He has no recurrent fevers or chills.  LFTs are improving, but total bilirubin continues to rise due to liver metastases and bile duct obstruction.  Hopefully we will see this improve following placement of biliary drain.  The patient's anemia is stable  Recommendations: 1.  From our standpoint, the patient may discharge home when medically stable.  Recommend speaking with infectious disease to transition the patient over to oral antibiotic for home use. 2.  We will plan to continue to hold Eliquis due to liver dysfunction.  Repeat PT/INR  3.  We will plan to discuss salvage chemotherapy after hospital discharge.  His total bilirubin continues to be elevated and we will likely not be able to give Abraxane unless this starts to improve.  Patient may be considered for single agent Gemzar. 4.  Decrease intravenous hydration, he has progressive leg edema secondary to hydration and hypoalbuminemia   LOS: 5 days   Mikey Bussing, DNP, AGPCNP-BC, AOCNP 10/16/19 Mr. Piper was interviewed and examined.  He is being treated for bacteremia and a right upper quadrant abscess.  He can be discharged home from an oncology standpoint if he can be transitioned to outpatient antibiotics.  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  He remains at high risk for thrombosis in the setting of metastatic pancreas cancer.  There has been no report of gross bleeding.  The PT/INR has been elevated secondary to malnutrition, sepsis, and antibiotics.  I agree with resuming apixaban anticoagulation if the INR improves.  The INR may now be lower since the apixaban has been discontinued for the past 4 days.  He understands we cannot initiate gemcitabine/Abraxane due to the persistent hyperbilirubinemia.  Liver enzymes are significantly improved and I am hopeful the bilirubin will improve over the next week.  I will continue to follow him daily while in the hospital and outpatient follow-up will be scheduled at the Cancer center.

## 2019-10-16 NOTE — Discharge Summary (Signed)
Physician Discharge Summary  Brian Torres:[id number] DOB: April 20, 1961 DOA: 10/11/2019  PCP: Cari Caraway, MD  Admit date: 10/11/2019 Discharge date: 10/16/2019  Admitted From:   Disposition:    Recommendations for Outpatient Follow-up:  1. Follow up with PCP in 1-2 weeks 2. Please obtain BMP/CBC in one week 3. Please follow up on the following pending results:  Home Health:  Equipment/Devices:  Discharge Condition: stable   CODE STATUS: full   Diet recommendation: Heart Healthy     Brief/Interim Summary: Brian Torres is a 58 y.o. male with medical history significant of metastatic pancreatic cancer, T2DM, HLD, HTN, and Depression who presents with fevers/chills.  Patient brought in with his wife.  He has been gradually declining over the past 2 weeks.  Per the wife, he has been having dwindling energy at most 2 hours/day.  However in the last couple of days they thought he was rallying.  He underwent CT of his abdomen and pelvis yesterday.  He had some diarrhea which they attributed to the contrast.  He had dinner around 830 and then went to bed.  This morning around 5 AM he woke up to use the restroom.  He began to have fevers and chills.  He got back in bed and when he tried to return to the restroom an hour later he was just too tired.  He was unable to make it to the bathroom but he did not pass out on the way.  He was brought in by EMS.  In the emergency room he has had fevers but he reports he does not feel them.  He has not noticed any significant abdominal pain.  He has a slight cough.  No other rashes.  There are home health nurse comes out every day to look at the abdominal dressing.  It has been healing well.  They report no fevers have been documented at home for the past couple of weeks and he has not felt febrile.  Otherwise he is at his baseline which has been gradually worsening.  No chest pain, palpitations, neurologic symptoms.  He continues to smoke 5  packs/week, no alcohol use or drug use  He was hypotensive on arrival. CT abdomen/pelvis done in the emergency department showed numerous liver metastasis, possible abscesses. Started on broad-spectrum antibiotics. IR consulted and he underwent IR guided drainage placement. Now blood cultures growing streptococcal species.ID consulted, suggest He will need a prolonged course of antibiotics but oral therapy with augmentin for 7 more days  with drainage will likely be adequate, depending on the organism sensitivities.  Patient developed acute anemia where his hemoglobin dropped down to 7.7.  He received 1 unit of PRBC.  He was evaluated by gastroenterology, he strongly denied any endoscopic work-up to find source of bleeding.  Oncology recommended holding Eliquis and following up as outpatient.  Interventional radiology evaluated the drain and mentioned that hepatic abscess drain will remain for 8 weeks and they will follow up with rest of the drains as outpatient.  On December 14, patient was doing fine.  He denied any new complaints and was felt to be at baseline.  So he was discharged home to follow-up with PCP, ID, gastroenterology, oncology, interventional radiology in 1 to 2 weeks.  Of note, his Eliquis and blood pressure medicines have been discontinued for now. More than 40 minutes spent in discharge planning and discussion with the patient  During his stay,  # Sepsis/gram-positive bacteremia: Presented with fever, tachycardia, tachypnea, elevated lactic  acid level. Currently hemodynamically stable. Sepsis secondary to liver abscess.Blood cultures showing Streptococcus species. Antibiotics narrowed to ceftriaxone. Will follow final culture and sensitivity. ID consulted  Suggest keep him on IV therapy while inpatient. CT of abdomen today shows resolution of the fluid collection/abscess.  Transition to oral Augmentin at discharge for another 7 days to assure complete resolution with  bacteremia.    Liver abscess: Underwent CT-guided right upper quadrant drain placement for abscesson 10/12/19. CT abdomen showed numerous liver mets and abscesses.Continue current antibiotics.  LFTs improving.  S/p PTC biliary drain placed 10/13/19  He was hospitalized from 08/07/2019 through 08/11/2019 with postsurgical intra-abdominal fluid collection. CT at that time showed 2 rim-enhancing fluid collection in the right upper quadrant concerning for abscess, colitis. Blood cultures had shown Prevotella. Underwent CT-guided aspiration of intra-abdominal fluid collection and placementof drainon 08/09/2019.He was discharged on Augmentin at that time.   Metastatic pancreatic adenocarcinoma: Follows with Dr. Learta Codding. I stage IV cancer.Marland KitchenHas worsening LFTs. Has metastatic liver disease. As per oncology, there was plan to begin salvage chemotherapy with gemcitabine/Abraxane on 10/09/2019. Chemotherapy held due to new hyperbilirubinemia. History of Whipple's procedure on 07/10/19. Has abdominal wound that is healing. Monitor CMP  AKI: Improved with IV fluids.  Anemia could be chronic sec.to pancreatic cancer: s/p 2 PRBC 10/13/19 , Hb dropped from 8.2 - 7.7, Stool occult blood +, GI consulted, will follow up recommendation. Transfuse 1 PRBC. Hemodynamically stable.  Diabetes mellitus2:Continue current insulin regimen. On antihyperglycemics at home.  Hypertension: Hypotensive on arrival. Antihypertensives held.  History of DVT: On Eliquis. Keep on hold given Anemia   Discharge Diagnoses:  Active Problems:   Sepsis ([place])   AKI (acute kidney injury) ([place])  Acute on chronic anemia-unclear etiology, possible due to GI bleed that has resolved Metastatic pancreatic adenocarcinoma with hepatic mets Type 2 diabetes mellitus-uncontrolled hyperglycemia History of hypertension-now normotensive History of DVT x2  Discharge Instructions  Discharge Instructions    Call MD  for:  temperature >100.4   Complete by: As directed    Diet - low sodium heart healthy   Complete by: As directed    Increase activity slowly   Complete by: As directed      Allergies as of 10/16/2019   No Known Allergies     Medication List    STOP taking these medications   apixaban 5 MG Tabs tablet Commonly known as: ELIQUIS   lisinopril 10 MG tablet Commonly known as: ZESTRIL   Pepcid AC 10 MG tablet Generic drug: famotidine     TAKE these medications   acetaminophen 325 MG tablet Commonly known as: TYLENOL Take 2 tablets (650 mg total) by mouth every 8 (eight) hours as needed for moderate pain or headache. What changed:   medication strength  how much to take  when to take this   amoxicillin-clavulanate 875-125 MG tablet Commonly known as: Augmentin Take 1 tablet by mouth 2 (two) times daily for 7 days.   b complex vitamins tablet Take 1 tablet by mouth daily.   Basaglar KwikPen 100 UNIT/ML Sopn Inject 19 Units into the skin at bedtime.   Chlorhexidine Gluconate Cloth 2 % Pads Apply 6 each topically daily for 10 days.   CINNAMON PO Take 1,000 mg by mouth 2 (two) times daily.   COQ-10 PO Take 1 tablet by mouth daily.   Creon 36000 UNITS Cpep capsule Generic drug: lipase/protease/amylase Take by mouth. Takes #2 with meals and #1 with snack   ferrous sulfate 325 (65 FE) MG  EC tablet Take 325 mg by mouth 2 (two) times a day.   Fish Oil 1000 MG Caps Take 1,000 mg by mouth 2 (two) times daily.   glimepiride 4 MG tablet Commonly known as: AMARYL Take 4 mg by mouth daily with breakfast.   HM Lidocaine Patch 4 % Ptch Generic drug: Lidocaine Apply 1 patch topically at bedtime.   HumaLOG KwikPen 100 UNIT/ML KwikPen Generic drug: insulin lispro Inject 3-13 Units into the skin 2 (two) times daily as needed (if bs is over 150).   lidocaine-prilocaine cream Commonly known as: EMLA Apply 1 application topically as needed (port access).    LORazepam 0.5 MG tablet Commonly known as: ATIVAN Take 1 tablet (0.5 mg total) by mouth every 8 (eight) hours as needed (for nausea).   Melatonin 10 MG Tabs Take 20 mg by mouth at bedtime.   metFORMIN 1000 MG tablet Commonly known as: GLUCOPHAGE Take 1,000 mg by mouth 2 (two) times daily.   methocarbamol 500 MG tablet Commonly known as: ROBAXIN Take 1 tablet (500 mg total) by mouth every 6 (six) hours as needed for muscle spasms.   ondansetron 4 MG disintegrating tablet Commonly known as: ZOFRAN-ODT Take 1 tablet (4 mg total) by mouth every 6 (six) hours as needed for nausea.   oxyCODONE 5 MG immediate release tablet Commonly known as: Oxy IR/ROXICODONE Take 1 tablet (5 mg total) by mouth every 6 (six) hours as needed for moderate pain, severe pain or breakthrough pain.   pantoprazole 40 MG tablet Commonly known as: PROTONIX Take 1 tablet (40 mg total) by mouth 2 (two) times daily before a meal.   pioglitazone 45 MG tablet Commonly known as: ACTOS Take 45 mg by mouth daily.   prochlorperazine 10 MG tablet Commonly known as: COMPAZINE Take 1 tablet (10 mg total) by mouth every 6 (six) hours as needed for nausea or vomiting.      Follow-up Information    Arne Cleveland, MD Follow up.   Specialties: Interventional Radiology, Radiology Why: Clinic will call to set up outpt drain follow up Contact information: [address] [place] [address] [phone]          No Known Allergies  Consultations:     Procedures/Studies: CT Angio Chest PE W and/or Wo Contrast  Result Date: 10/11/2019 CLINICAL DATA:  7m omni350 Pt had whipple procedure done in sept 2020. Pt was to start chemo today. Bilirubin level is going up. Denies any chest pain or chest pressure. Pt tates has a rapid heart rate. eval for pE EXAM: CT ANGIOGRAPHY CHEST WITH CONTRAST TECHNIQUE: Multidetector CT imaging of the chest was performed using the standard protocol during bolus  administration of intravenous contrast. Multiplanar CT image reconstructions and MIPs were obtained to evaluate the vascular anatomy. CONTRAST:  732mOMNIPAQUE IOHEXOL 350 MG/ML SOLN COMPARISON:  CT abdomen pelvis 10/10/2019 FINDINGS: Cardiovascular: No filling defects within the pulmonary arteries to suggest acute pulmonary embolism. No acute findings of the aorta or great vessels. No pericardial fluid. Mediastinum/Nodes: No axillary supraclavicular adenopathy. No mediastinal hilar adenopathy. No pericardial effusion. Port in the anterior chest wall with tip in distal SVC. Lungs/Pleura: No suspicious pulmonary nodules. No infiltrate Upper Abdomen: Innumerable hepatic metastasis again noted. Musculoskeletal: No aggressive osseous lesion Review of the MIP images confirms the above findings. IMPRESSION: 1. No evidence acute pulmonary embolism. 2. No acute pulmonary parenchymal findings. 3. Innumerable hepatic metastasis. Electronically Signed   By: StSuzy Bouchard.D.   On: 10/11/2019  09:50   CT ABDOMEN PELVIS W CONTRAST  Result Date: 10/13/2019 CLINICAL DATA:  58 year old with history of metastatic pancreatic cancer and status post Whipple. History of intra-abdominal abscess and recent percutaneous drain placement in a perihepatic abscess on 10/12/2019. EXAM: CT ABDOMEN AND PELVIS WITH CONTRAST TECHNIQUE: Multidetector CT imaging of the abdomen and pelvis was performed using the standard protocol following bolus administration of intravenous contrast. CONTRAST:  124m OMNIPAQUE IOHEXOL 300 MG/ML  SOLN COMPARISON:  CT 10/10/2019 FINDINGS: Lower chest: Again noted is a poorly defined pleural-based density in the right lower lobe on sequence 6, image 14. Dependent densities are most compatible with atelectasis. Interval development of trace bilateral pleural effusions. Hepatobiliary: Again noted are innumerable hypodensities throughout the liver compatible with metastatic disease. Index lesion near the central  aspect of the liver measures up to 2.9 cm. Small amount of perihepatic ascites has increased from the recent examination. Postsurgical changes compatible with Whipple procedure and hepatojejunostomy. Poorly defined structures in the central aspect of the liver related to the surgical anastomosis and liver lesions in this area. Evidence for areas of segmental intrahepatic biliary dilatation. Persistent intrahepatic biliary dilatation in the right hepatic lobe on sequence 2, image 29. Limited evaluation of the central biliary structures due to the prior surgery. Pancreas: Post Whipple.  Remaining pancreas is not well visualized. Spleen: Stable appearance of the spleen. Increased perisplenic ascites. Adrenals/Urinary Tract: Normal appearance of both adrenal glands. Negative for hydronephrosis. Evidence for a cortical cyst in the right kidney upper pole. No suspicious renal lesions. Mild distention of the urinary bladder. Stomach/Bowel: Colonic diverticula. Previous examination demonstrated abnormal tissue or soft tissue thickening near the hepatic flexure of the colon but this area is poorly characterized without oral contrast. No evidence for a bowel obstruction. Evidence for a gastrojejunostomy related to the Whipple procedure. Vascular/Lymphatic: Atherosclerotic calcifications in the abdominal aorta without aneurysm. There are probably abnormal lymph nodes in the central upper abdomen and porta hepatis region but poorly characterized on this examination. There may be narrowing in the main portal vein on sequence 2, image 38 which could be secondary to lymphadenopathy. Main portal vein is poorly characterized on this examination but portal vein is patent. Reproductive: Stable appearance of the prostate. Other: Small amount ascites in the pelvis is new. Slightly increased ascites in the abdomen with some mesenteric edema. The perihepatic fluid collection from the previous examination has resolved with the percutaneous  drain. Percutaneous drain is positioned the perihepatic space and similar to the placement images. Prior examination raised concern for tumor or abscess collection near the hepatic flexure of the colon. There is no longer a definite fluid collection adjacent to the hepatic flexure. There may be a small connection between the percutaneous drain and where the previous hepatic flexure collection was located, suggesting this hepatic flexure collection has been decompressed with the percutaneous drain. No new or additional intra-abdominal abscess collections. Musculoskeletal: No acute bone abnormality. IMPRESSION: 1. Perihepatic abscess collection has resolved with the percutaneous drain. In addition, the collection adjacent to the hepatic flexure of the colon has either resolved or decompressed in the interim and probably related to the percutaneous drain. However, the hepatic flexure area is poorly characterized on this examination. 2. Innumerable liver lesions compatible with metastatic disease. In addition, there are areas of intrahepatic biliary dilatation likely related to obstruction from liver lesions. Biliary dilatation is likely segmental and scattered throughout the liver. 3. Increased ascites in the abdomen and pelvis. Overall ascites volume is small. Trace pleural  fluid has developed. Electronically Signed   By: Markus Daft M.D.   On: 10/13/2019 12:37   CT Abdomen Pelvis W Contrast  Result Date: 10/10/2019 CLINICAL DATA:  58 year old male with history of pancreatic cancer diagnosed in February 2020. Elevated bilirubin. Evaluate for potential biliary obstruction. EXAM: CT ABDOMEN AND PELVIS WITH CONTRAST TECHNIQUE: Multidetector CT imaging of the abdomen and pelvis was performed using the standard protocol following bolus administration of intravenous contrast. CONTRAST:  150m OMNIPAQUE IOHEXOL 300 MG/ML  SOLN COMPARISON:  CT the abdomen and pelvis 08/24/2019. Abdominal MRI 09/05/2019. FINDINGS: Lower  chest: Unremarkable. Hepatobiliary: Marked increase in number and size of what are now innumerable low-intermediate attenuation lesions scattered throughout the hepatic parenchyma, largest of which measure up to 3.6 x 2.4 cm in segment 4B of the liver (axial image 37 of series 3). Some of these lesions appear elongated within the hepatic parenchyma best appreciated on sagittal image 53 of series 7, where that length of the lesion previously described measures up to 11.6 cm. These lesions are exerting local mass effect upon adjacent structures causing compression of central bile ducts resulting in mild to moderate intrahepatic biliary ductal dilatation throughout the right lobe of the liver. Status post cholecystectomy is part of Whipple procedure. In the gallbladder fossa there is a large amount of intermediate attenuation fluid and/or soft tissue which has a thick rim of peripheral enhancement estimated to measure approximately 6.1 x 6.6 x 3.5 cm (axial image 43 of series 3 and coronal image 33 of series 6). This soft tissue appears intimately associated both with the undersurface of the liver, as well as several adjacent small bowel loops, in close proximity to the presumed location of the hepaticojejunostomy. Previously noted fluid collection beneath the right lobe of the liver (which previously had a percutaneous drainage catheter on prior CT 08/24/2019) is again noted on today's examination, currently measuring 5.7 x 5.3 x 6.9 cm (axial image 51 of series 3 and coronal image 34 of series 6) with thick peripheral rim of soft tissue and enhancement which appears to partially extend into the right oblique musculature best appreciated on axial image 47 of series 3 and coronal image 33 of series 6). Pancreas: Postoperative changes of Whipple procedure. Spleen: Unremarkable. Adrenals/Urinary Tract: 1.5 cm low-attenuation lesion in the upper pole of the right kidney, compatible with a simple cyst. Left kidney and  bilateral adrenal glands are normal in appearance. No hydroureteronephrosis. Urinary bladder is normal in appearance. Stomach/Bowel: Postoperative changes of Whipple procedure. Loops of small bowel and colon traversing beneath the right lobe of the liver appear thickened as they come in close association with the previously noted fluid and/or soft tissue collection in this region. No pathologic dilatation of small bowel or colon. Normal appendix. Vascular/Lymphatic: Aortic atherosclerosis, without evidence of aneurysm or dissection in the abdominal or pelvic vasculature. Small of filling defect in the proximal portal vein (axial image 36 of series 3), concerning for nonobstructive thrombus. Enlarged lymph node immediately anterior to the superior mesenteric vein (axial image 43 of series 3) measuring 1.5 cm in short axis. Reproductive: Prostate gland and seminal vesicles are unremarkable in appearance. Other: Trace volume of perihepatic ascites.  No pneumoperitoneum. Musculoskeletal: There are no aggressive appearing lytic or blastic lesions noted in the visualized portions of the skeleton. IMPRESSION: 1. Marked increased number and size of innumerable hepatic lesions, as well as persistence of right infra hepatic postoperative fluid collection and development of an additional fluid and/or soft tissue collection beneath  the gallbladder fossa. Overall, the findings are favored to reflect progressive metastatic disease to the liver, however, there is overlap with potential infectious etiologies (multiple intrahepatic abscesses and subhepatic abscesses). Guided sampling of one or more of these lesions is suggested to establish a definitive diagnosis. 2. 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 3. Interval development of mildly enlarged mesenteric lymph node measuring 1.5 cm in short axis immediately anterior to the portal vein, suspicious for a metastatic lymph node, although reactive lymphadenopathy  in the setting of extensive infection is not excluded. 4. Additional incidental findings, as above. Electronically Signed   By: Vinnie Langton M.D.   On: 10/10/2019 17:34   DG Chest Port 1 View  Result Date: 10/11/2019 CLINICAL DATA:  [place]. Additional history provided: Syncopal episode, history of liver CA. EXAM: PORTABLE CHEST 1 VIEW COMPARISON:  CT angiogram chest 08/07/2019, chest radiograph 08/07/2011 FINDINGS: Left chest infusion port catheter with tip projecting in the region of the superior cavoatrial junction. Heart size within normal limits. No airspace consolidation within the lungs. No evidence of pleural effusion or pneumothorax. No acute bony abnormality. IMPRESSION: No evidence of acute cardiopulmonary abnormality. Electronically Signed   By: Kellie Simmering DO   On: 10/11/2019 07:57   [place] CORE BIOPSY (LIVER)  Result Date: 09/26/2019 INDICATION: History of pancreatic carcinoma with development multiple small liver lesions. EXAM: ULTRASOUND GUIDED CORE BIOPSY OF LIVER MEDICATIONS: None. ANESTHESIA/SEDATION: Fentanyl 100 mcg IV; Versed 4.0 mg IV Moderate Sedation Time:  20 minutes. The patient was continuously monitored during the procedure by the interventional radiology nurse under my direct supervision. PROCEDURE: The procedure, risks, benefits, and alternatives were explained to the patient. Questions regarding the procedure were encouraged and answered. The patient understands and consents to the procedure. A time-out was performed prior to initiating the procedure. Ultrasound was performed of the liver. The abdominal wall was prepped with chlorhexidine in a sterile fashion, and a sterile drape was applied covering the operative field. A sterile gown and sterile gloves were used for the procedure. Local anesthesia was provided with 1% Lidocaine. After choosing lesions for sampling, a 17 gauge trocar needle was advanced into the liver. After confirming needle tip position, coaxial 18 gauge  core biopsy samples were obtained. Three intact core biopsy samples were submitted in formalin. Gel-Foam pledgets were advanced through the outer needle as it was retracted. Additional ultrasound was performed. COMPLICATIONS: None immediate. FINDINGS: Ultrasound demonstrates numerous solid hypoechoic rounded lesions throughout the liver parenchyma. The largest measures approximately 1.9 cm within the inferior right lobe near the juncture with the left lobe. Lesions have likely increased in size and number since the MRI study on 09/05/2019 and are suspicious for metastatic disease by imaging. Solid tissue was obtained with biopsy. IMPRESSION: Ultrasound-guided core biopsy performed adjacent hypoechoic solid lesions within the inferior right lobe of the liver. The largest measures approximately 1.9 cm and lesions have likely progressed since the MRI study on 09/05/2019. Electronically Signed   By: Aletta Edouard M.D.   On: 09/26/2019 14:49   IR INT EXT BILIARY DRAIN WITH CHOLANGIOGRAM  Result Date: 10/13/2019 INDICATION: Metastatic pancreatic carcinoma status post Whipple. Worsening bilirubin with evidence of intrahepatic biliary ductal dilatation on recent cross-sectional imaging. EXAM: PERCUTANEOUS TRANSHEPATIC CHOLANGIOGRAM PERCUTANEOUS INTERNAL/EXTERNAL BILIARY DRAIN CATHETER PLACEMENT MEDICATIONS: Patient was already receiving adequate prophylactic antibiotic coverage as an inpatient. ANESTHESIA/SEDATION: Intravenous Fentanyl 154mg and Versed 244mwere administered as conscious sedation during continuous monitoring of the patient's level of consciousness and physiological / cardiorespiratory  status by the radiology RN, with a total moderate sedation time of 23 minutes. PROCEDURE: Informed written consent was obtained from the patient after a thorough discussion of the procedural risks, benefits and alternatives. All questions were addressed. Maximal Sterile Barrier Technique was utilized including caps,  mask, sterile gowns, sterile gloves, sterile drape, hand hygiene and skin antiseptic. A timeout was performed prior to the initiation of the procedure. Dilated intrahepatic biliary tree was identified on ultrasound and a peripheral right lower lobe duct was selected for approach. Skin and subcutaneous tissues down to the liver capsule infiltrated with 1% lidocaine. Under real-time ultrasound guidance, a 21 gauge trocar needle was advanced into the duct. 018 guidewire advanced centrally easily. Micro dilator was placed over the wire. Small contrast injection under fluoroscopy confirmed appropriate positioning. Micro dilator was exchanged over the 018 guidewire for a 5 [place] micropuncture dilator set. Contrast injection for percutaneous transhepatic cholangiogram was performed. Catheter exchanged over an angled Glidewire for a 5 French Kumpe catheter, advanced across the choledochojejunostomy. The Kumpe was exchanged over an Amplatz wire for vascular dilator which facilitated placement of a 10 French internal/external biliary drain catheter, placed with sideholes spanning the central obstruction, across the CBD and into the choledochojejunostomy. Confirmatory contrast injection under fluoroscopy performed. The catheter was then flushed with saline and secured externally with 0 Prolene suture and StatLock, placed to external drain bag to maximize biliary decompression. The patient tolerated the procedure well. FLUOROSCOPY TIME:  2 minutes 30 seconds; 466 mGy COMPLICATIONS: None immediate. FINDINGS: The percutaneous transhepatic cholangiogram confirms dilated intrahepatic right biliary tree . Multiple segments of tapered narrowing near the biliary confluence with the CBD. The CBD is decompressed. The choledochojejunostomy is widely patent. 10 French internal/external biliary drainage catheter placed as above. IMPRESSION: 1. 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 2. Technically successful internal/external biliary drain catheter placement. Electronically Signed   By: Lucrezia Europe M.D.   On: 10/13/2019 16:47   CT IMAGE GUIDED DRAINAGE BY PERCUTANEOUS CATHETER  Result Date: 10/12/2019 INDICATION: 58 year old male with a history of right upper quadrant abscess EXAM: CT GUIDED DRAINAGE OF  ABSCESS MEDICATIONS: The patient is currently admitted to the hospital and receiving intravenous antibiotics. The antibiotics were administered within an appropriate time frame prior to the initiation of the procedure. ANESTHESIA/SEDATION: 2.0 mg IV Versed 100 mcg IV Fentanyl Moderate Sedation Time:  12 minutes The patient was continuously monitored during the procedure by the interventional radiology nurse under my direct supervision. COMPLICATIONS: None TECHNIQUE: Informed written consent was obtained from the patient after a thorough discussion of the procedural risks, benefits and alternatives. All questions were addressed. Maximal Sterile Barrier Technique was utilized including caps, mask, sterile gowns, sterile gloves, sterile drape, hand hygiene and skin antiseptic. A timeout was performed prior to the initiation of the procedure. PROCEDURE: The operative field was prepped with Chlorhexidine in a sterile fashion, and a sterile drape was applied covering the operative field. A sterile gown and sterile gloves were used for the procedure. Local anesthesia was provided with 1% Lidocaine. Scout CT was acquired. The patient was prepped and draped in usual sterile fashion. 1% lidocaine was used for local anesthesia. Using CT guidance, guide needle was advanced into the abscess of the right upper quadrant. Modified Seldinger technique was used to place a 10 [place] drain. 30 cc of purulent material aspirated for a culture. Drain was attached to bulb suction and sutured in  position. Patient tolerated the procedure well and remained  hemodynamically stable throughout. No complications were encountered and no significant blood loss. FINDINGS: Scout CT demonstrates abscess of the right upper quadrant, sub a pathic. Final CT demonstrates drainage catheter within the abscess. Culture of 30 cc purulent material sent to the lab. IMPRESSION: Status post CT-guided drainage of right upper quadrant abscess. Signed, Dulcy Fanny. Dellia Nims, RPVI [facility] Radiology Electronically Signed   By: Corrie Mckusick D.O.   On: 10/12/2019 09:37       Subjective:   Discharge Exam: Vitals:   10/15/19 2105 10/16/19 0632  BP: 124/81 131/74  Pulse: 97 82  Resp: 16 20  Temp: 98.4 F (36.9 C) 98.2 F (36.8 C)  SpO2: 99% 97%   Vitals:   10/15/19 1800 10/15/19 1825 10/15/19 2105 10/16/19 0632  BP: 134/82 127/75 124/81 131/74  Pulse: 79 80 97 82  Resp: _0 Temp: 98.6 F (37 C) 98.5 F (36.9 C) 98.4 F (36.9 C) 98.2 F (36.8 C)  TempSrc: Oral  Oral Oral  SpO2: 100%  99% 97%  Weight:      Height:        General: Pt is alert, awake, not in acute distress Cardiovascular: RRR, S1/S2 +, no rubs, no gallops Respiratory: CTA bilaterally, no wheezing, no rhonchi Abdominal: Soft, NT, ND, bowel sounds + Extremities: no edema, no cyanosis    The results of significant diagnostics from this hospitalization (including imaging, microbiology, ancillary and laboratory) are listed below for reference.     Microbiology: Recent Results (from the past 240 hour(s))  Blood Culture (routine x 2)     Status: Abnormal   Collection Time: 10/11/19  7:10 AM   Specimen: BLOOD  Result Value Ref Range Status   Specimen Description   Final    BLOOD PORTA CATH Performed at [facility] [address], [place], [place] [address]    Special Requests   Final    BOTTLES DRAWN AEROBIC AND ANAEROBIC Blood Culture adequate volume Performed at [facility]  [address] [place], [place] [address]    Culture  Setup Time   Final    GRAM POSITIVE COCCI IN CHAINS CRITICAL RESULT CALLED TO, READ BACK BY AND VERIFIED WITH: B GREEN PHARMD 10/12/19 0629 JDW IN BOTH AEROBIC AND ANAEROBIC BOTTLES    Culture (A)  Final    STREPTOCOCCUS GROUP F SUSCEPTIBILITIES PERFORMED ON PREVIOUS CULTURE WITHIN THE LAST 5 DAYS. Performed at [facility], [place] [address] [place], [place] [address]    Report Status 10/14/2019 FINAL  Final  Respiratory Panel by RT PCR (Flu A&B, Covid) - Nasopharyngeal Swab     Status: None   Collection Time: 10/11/19  7:11 AM   Specimen: Nasopharyngeal Swab  Result Value Ref Range Status   SARS Coronavirus 2 by RT PCR NEGATIVE NEGATIVE Final    Comment: (NOTE) SARS-CoV-2 target nucleic acids are NOT DETECTED. The SARS-CoV-2 RNA is generally detectable in upper respiratoy specimens during the acute phase of infection. The lowest concentration of SARS-CoV-2 viral copies this assay can detect is 131 copies/mL. A negative result does not preclude SARS-Cov-2 infection and should not be used as the sole basis for treatment or other patient management decisions. A negative result may occur with  improper specimen collection/handling, submission of specimen other than nasopharyngeal swab, presence of viral mutation(s) within the areas targeted by this assay, and inadequate number of  viral copies (<131 copies/mL). A negative result must be combined with clinical observations, patient history, and epidemiological information. The expected result is Negative. Fact Sheet for Patients:  PinkCheek.be Fact Sheet for Healthcare Providers:  GravelBags.it This test is not yet ap proved or cleared by the [place] FDA and  has been authorized for detection and/or diagnosis of SARS-CoV-2 by FDA under an Emergency Use Authorization (EUA). This EUA will remain  in effect (meaning this  test can be used) for the duration of the COVID-19 declaration under Section 564(b)(1) of the Act, 21 U.S.C. section 360bbb-3(b)(1), unless the authorization is terminated or revoked sooner.    Influenza A by PCR NEGATIVE NEGATIVE Final   Influenza B by PCR NEGATIVE NEGATIVE Final    Comment: (NOTE) The Xpert Xpress SARS-CoV-2/FLU/RSV assay is intended as an aid in  the diagnosis of influenza from Nasopharyngeal swab specimens and  should not be used as a sole basis for treatment. Nasal washings and  aspirates are unacceptable for Xpert Xpress SARS-CoV-2/FLU/RSV  testing. Fact Sheet for Patients: PinkCheek.be Fact Sheet for Healthcare Providers: GravelBags.it This test is not yet approved or cleared by the [place] FDA and  has been authorized for detection and/or diagnosis of SARS-CoV-2 by  FDA under an Emergency Use Authorization (EUA). This EUA will remain  in effect (meaning this test can be used) for the duration of the  Covid-19 declaration under Section 564(b)(1) of the Act, 21  U.S.C. section 360bbb-3(b)(1), unless the authorization is  terminated or revoked. Performed at [facility], [place] [address] [place], [place] [address]   Blood Culture (routine x 2)     Status: Abnormal   Collection Time: 10/11/19  7:15 AM   Specimen: BLOOD  Result Value Ref Range Status   Specimen Description   Final    BLOOD LEFT ANTECUBITAL Performed at [facility] [address], [place], [place] [address]    Special Requests   Final    BOTTLES DRAWN AEROBIC AND ANAEROBIC Blood Culture adequate volume Performed at [facility] [address] [place], [place] [address]    Culture  Setup Time   Final    GRAM POSITIVE COCCI IN CHAINS IN BOTH AEROBIC AND ANAEROBIC BOTTLES CRITICAL RESULT CALLED TO, READ BACK BY AND VERIFIED WITH: PHARMD M RENZ 121020 AT 908 AM  BY  CM Performed at [facility], [place] [address] [place], [place] [address]    Culture STREPTOCOCCUS GROUP F (A)  Final   Report Status 10/14/2019 FINAL  Final   Organism ID, Bacteria STREPTOCOCCUS GROUP F  Final      Susceptibility   Streptococcus group f - MIC*    PENICILLIN 0.25 INTERMEDIATE Intermediate     CEFTRIAXONE 1 SENSITIVE Sensitive     ERYTHROMYCIN <=0.12 SENSITIVE Sensitive     LEVOFLOXACIN 0.5 SENSITIVE Sensitive     VANCOMYCIN 0.5 SENSITIVE Sensitive     * STREPTOCOCCUS GROUP F  Blood Culture ID Panel (Reflexed)     Status: Abnormal   Collection Time: 10/11/19  7:15 AM  Result Value Ref Range Status   Enterococcus species NOT DETECTED NOT DETECTED Final   Listeria monocytogenes NOT DETECTED NOT DETECTED Final   Staphylococcus species NOT DETECTED NOT DETECTED Final   Staphylococcus aureus (BCID) NOT DETECTED NOT DETECTED Final   Streptococcus species DETECTED (A) NOT DETECTED Final    Comment: Not Enterococcus species, Streptococcus agalactiae, Streptococcus pyogenes, or Streptococcus pneumoniae. CRITICAL RESULT CALLED TO,  READ BACK BY AND VERIFIED WITH: PHARMD M RENZ 121020 AT 82 BY CM    Streptococcus agalactiae NOT DETECTED NOT DETECTED Final   Streptococcus pneumoniae NOT DETECTED NOT DETECTED Final   Streptococcus pyogenes NOT DETECTED NOT DETECTED Final   Acinetobacter baumannii NOT DETECTED NOT DETECTED Final   Enterobacteriaceae species NOT DETECTED NOT DETECTED Final   Enterobacter cloacae complex NOT DETECTED NOT DETECTED Final   Escherichia coli NOT DETECTED NOT DETECTED Final   Klebsiella oxytoca NOT DETECTED NOT DETECTED Final   Klebsiella pneumoniae NOT DETECTED NOT DETECTED Final   Proteus species NOT DETECTED NOT DETECTED Final   Serratia marcescens NOT DETECTED NOT DETECTED Final   Haemophilus influenzae NOT DETECTED NOT DETECTED Final   Neisseria meningitidis NOT DETECTED NOT DETECTED Final   Pseudomonas aeruginosa NOT DETECTED NOT DETECTED  Final   Candida albicans NOT DETECTED NOT DETECTED Final   Candida glabrata NOT DETECTED NOT DETECTED Final   Candida krusei NOT DETECTED NOT DETECTED Final   Candida parapsilosis NOT DETECTED NOT DETECTED Final   Candida tropicalis NOT DETECTED NOT DETECTED Final    Comment: Performed at [facility], [place]. [address] [place], [place] [address]  Urine culture     Status: None   Collection Time: 10/11/19  6:41 PM   Specimen: In/Out Cath Urine  Result Value Ref Range Status   Specimen Description   Final    IN/OUT CATH URINE Performed at [facility], [place] [address], [place], [place] [address]    Special Requests   Final    NONE Performed at [facility], [place] [address] [place], [place] [address]    Culture   Final    NO GROWTH Performed at [facility], [place] [address] [place], [place] [address]    Report Status 10/12/2019 FINAL  Final  Aerobic/Anaerobic Culture (surgical/deep wound)     Status: None (Preliminary result)   Collection Time: 10/12/19  8:58 AM   Specimen: Abscess  Result Value Ref Range Status   Specimen Description   Final    ABSCESS ABDOMEN Performed at [facility] [address], [place], [place] [address]    Special Requests   Final    NONE Performed at [facility], [place] [address] [place], [place] [address]    Gram Stain   Final    ABUNDANT WBC PRESENT, PREDOMINANTLY PMN MODERATE GRAM NEGATIVE COCCOBACILLI Performed at [facility], [place] [address] [place], [place] [address]    Culture   Final    ABUNDANT VIRIDANS STREPTOCOCCUS FEW KLEBSIELLA PNEUMONIAE NO ANAEROBES ISOLATED; CULTURE IN PROGRESS FOR 5 DAYS    Report Status PENDING  Incomplete   Organism ID, Bacteria VIRIDANS STREPTOCOCCUS  Final   Organism ID, Bacteria KLEBSIELLA PNEUMONIAE  Final      Susceptibility   Klebsiella pneumoniae - MIC*    AMPICILLIN RESISTANT Resistant     CEFAZOLIN <=4  SENSITIVE Sensitive     CEFEPIME <=1 SENSITIVE Sensitive     CEFTAZIDIME <=1 SENSITIVE Sensitive     CEFTRIAXONE <=1 SENSITIVE Sensitive     CIPROFLOXACIN <=0.25 SENSITIVE Sensitive     GENTAMICIN <=1 SENSITIVE Sensitive     IMIPENEM 0.5 SENSITIVE Sensitive     TRIMETH/SULFA <=20 SENSITIVE Sensitive     AMPICILLIN/SULBACTAM 4 SENSITIVE Sensitive     PIP/TAZO <=4 SENSITIVE Sensitive     * FEW KLEBSIELLA PNEUMONIAE   Viridans streptococcus - MIC*  PENICILLIN 0.25 INTERMEDIATE Intermediate     CEFTRIAXONE 1 SENSITIVE Sensitive     ERYTHROMYCIN <=0.12 SENSITIVE Sensitive     LEVOFLOXACIN 0.5 SENSITIVE Sensitive     VANCOMYCIN 0.5 SENSITIVE Sensitive     * ABUNDANT VIRIDANS STREPTOCOCCUS  Culture, blood (routine x 2)     Status: None (Preliminary result)   Collection Time: 10/13/19  5:18 AM   Specimen: BLOOD  Result Value Ref Range Status   Specimen Description   Final    BLOOD RIGHT WRIST Performed at [facility] [address] [place], [place] [address]    Special Requests   Final    BOTTLES DRAWN AEROBIC AND ANAEROBIC Blood Culture adequate volume Performed at [facility] [address] [place], [place] [address]    Culture   Final    NO GROWTH 3 DAYS Performed at [facility], [place] [address] [place], [place] [address]    Report Status PENDING  Incomplete  Culture, blood (routine x 2)     Status: None (Preliminary result)   Collection Time: 10/13/19  5:18 AM   Specimen: BLOOD  Result Value Ref Range Status   Specimen Description   Final    BLOOD RIGHT ARM Performed at [facility] [address], [place], [place] [address]    Special Requests   Final    BOTTLES DRAWN AEROBIC AND ANAEROBIC Blood Culture adequate volume Performed at [facility] [address], [place], [place] [address]    Culture   Final    NO GROWTH 3 DAYS Performed at [facility], [place] [address],  [place],  [address]    Report Status PENDING  Incomplete     Labs: BNP (last 3 results) No results for input(s): BNP in the last 8760 hours. Basic Metabolic Panel: Recent Labs  Lab 10/12/19 0445 10/13/19 0400 10/14/19 0358 10/15/19 0500 10/16/19 0500  NA 133* 133* 132* 131* 132*  K 4.1 3.9 4.0 3.7 3.9  CL 105 105 103 103 104  CO2 17* 19* 21* 21* 21*  GLUCOSE 106* 173* 221* 211* 264*  BUN 33* _0 CREATININE 1.15 0.94 0.72 0.72 0.69  CALCIUM 7.5* 7.6* 7.7* 7.5* 7.5*  MG  --   --  1.5* 1.4* 1.4*  PHOS  --   --  2.4* 2.3* 2.5   Liver Function Tests: Recent Labs  Lab 10/12/19 0445 10/13/19 0400 10/14/19 0358 10/15/19 0500 10/16/19 0500  AST 551* 225* 81* 39 35  ALT 184* 146* 100* 69* 52*  ALKPHOS 747* 660* 615* 516* 401*  BILITOT 6.2* 6.4* 8.2* 9.6* 10.2*  PROT 6.0* 5.5* 5.4* 5.4* 5.4*  ALBUMIN 1.7* 1.5* 1.5* 1.5* 1.4*   Recent Labs  Lab 10/11/19 0710  LIPASE 14   Recent Labs  Lab 10/11/19 0721  AMMONIA 40*   CBC: Recent Labs  Lab 10/11/19 0710 10/12/19 0445 10/13/19 0400 10/13/19 1758 10/14/19 0358 10/15/19 0500 10/16/19 0028 10/16/19 0500  WBC 3.1* 10.1 10.8*  --  11.4* 10.2  --  9.3  NEUTROABS 2.8  --  8.6*  --   --   --   --   --   HGB 12.9* 7.2* 6.6* 7.5* 8.2* 7.7* 8.3* 7.9*  HCT 39.8 22.3* 20.4* 22.9* 25.2* 23.7* 25.5* 24.7*  MCV 87.3 89.2 88.7  --  85.7 84.9  --  84.9  PLT 169 181 196  --  192 200  --  176   Cardiac Enzymes: No results for input(s): CKTOTAL, CKMB, CKMBINDEX, TROPONINI in the last 168 hours. BNP: Invalid input(s): POCBNP CBG: Recent Labs  Lab 10/15/19 1444 10/15/19 1715 10/15/19 2033 10/16/19 0751 10/16/19 1137  GLUCAP 437* 519* 337* 247* 330*   D-Dimer No results for input(s): DDIMER in the last 72 hours. Hgb A1c No results for input(s): HGBA1C in the last 72 hours. Lipid Profile No results for input(s): CHOL, HDL, LDLCALC, TRIG, CHOLHDL, LDLDIRECT in the last 72 hours. Thyroid function studies No  results for input(s): TSH, T4TOTAL, T3FREE, THYROIDAB in the last 72 hours.  Invalid input(s): FREET3 Anemia work up No results for input(s): VITAMINB12, FOLATE, FERRITIN, TIBC, IRON, RETICCTPCT in the last 72 hours. Urinalysis    Component Value Date/Time   COLORURINE AMBER (A) 10/11/2019 1841   APPEARANCEUR HAZY (A) 10/11/2019 1841   LABSPEC >1.046 (H) 10/11/2019 1841   PHURINE 5.0 10/11/2019 1841   GLUCOSEU 150 (A) 10/11/2019 1841   HGBUR SMALL (A) 10/11/2019 1841   BILIRUBINUR MODERATE (A) 10/11/2019 1841   KETONESUR NEGATIVE 10/11/2019 1841   PROTEINUR 30 (A) 10/11/2019 1841   NITRITE NEGATIVE 10/11/2019 1841   LEUKOCYTESUR NEGATIVE 10/11/2019 1841   Sepsis Labs Invalid input(s): PROCALCITONIN,  WBC,  LACTICIDVEN Microbiology Recent Results (from the past 240 hour(s))  Blood Culture (routine x 2)     Status: Abnormal   Collection Time: 10/11/19  7:10 AM   Specimen: BLOOD  Result Value Ref Range Status   Specimen Description   Final    BLOOD PORTA CATH Performed at [facility], [place] [address], [place], [place] [address]    Special Requests   Final    BOTTLES DRAWN AEROBIC AND ANAEROBIC Blood Culture adequate volume Performed at [facility] [address] [place], [place] [address]    Culture  Setup Time   Final    GRAM POSITIVE COCCI IN CHAINS CRITICAL RESULT CALLED TO, READ BACK BY AND VERIFIED WITH: B GREEN PHARMD 10/12/19 0629 JDW IN BOTH AEROBIC AND ANAEROBIC BOTTLES    Culture (A)  Final    STREPTOCOCCUS GROUP F SUSCEPTIBILITIES PERFORMED ON PREVIOUS CULTURE WITHIN THE LAST 5 DAYS. Performed at [facility], [place] [address] [place],  [address]    Report Status 10/14/2019 FINAL  Final  Respiratory Panel by RT PCR (Flu A&B, Covid) - Nasopharyngeal Swab     Status: None   Collection Time: 10/11/19  7:11 AM   Specimen: Nasopharyngeal Swab  Result Value Ref Range Status   SARS Coronavirus 2 by RT PCR  NEGATIVE NEGATIVE Final    Comment: (NOTE) SARS-CoV-2 target nucleic acids are NOT DETECTED. The SARS-CoV-2 RNA is generally detectable in upper respiratoy specimens during the acute phase of infection. The lowest concentration of SARS-CoV-2 viral copies this assay can detect is 131 copies/mL. A negative result does not preclude SARS-Cov-2 infection and should not be used as the sole basis for treatment or other patient management decisions. A negative result may occur with  improper specimen collection/handling, submission of specimen other than nasopharyngeal swab, presence of viral mutation(s) within the areas targeted by this assay, and inadequate number of viral copies (<131 copies/mL). A negative result must be combined with clinical observations, patient history, and epidemiological information. The expected result is Negative. Fact Sheet for Patients:  PinkCheek.be Fact Sheet for Healthcare Providers:  GravelBags.it This test is not yet ap proved or cleared by the [place] and  has been authorized  for detection and/or diagnosis of SARS-CoV-2 by FDA under an Emergency Use Authorization (EUA). This EUA will remain  in effect (meaning this test can be used) for the duration of the COVID-19 declaration under Section 564(b)(1) of the Act, 21 U.S.C. section 360bbb-3(b)(1), unless the authorization is terminated or revoked sooner.    Influenza A by PCR NEGATIVE NEGATIVE Final   Influenza B by PCR NEGATIVE NEGATIVE Final    Comment: (NOTE) The Xpert Xpress SARS-CoV-2/FLU/RSV assay is intended as an aid in  the diagnosis of influenza from Nasopharyngeal swab specimens and  should not be used as a sole basis for treatment. Nasal washings and  aspirates are unacceptable for Xpert Xpress SARS-CoV-2/FLU/RSV  testing. Fact Sheet for Patients: PinkCheek.be Fact Sheet for Healthcare  Providers: GravelBags.it This test is not yet approved or cleared by the [place] FDA and  has been authorized for detection and/or diagnosis of SARS-CoV-2 by  FDA under an Emergency Use Authorization (EUA). This EUA will remain  in effect (meaning this test can be used) for the duration of the  Covid-19 declaration under Section 564(b)(1) of the Act, 21  U.S.C. section 360bbb-3(b)(1), unless the authorization is  terminated or revoked. Performed at [facility], [place] [address], [place], [place] [address]   Blood Culture (routine x 2)     Status: Abnormal   Collection Time: 10/11/19  7:15 AM   Specimen: BLOOD  Result Value Ref Range Status   Specimen Description   Final    BLOOD LEFT ANTECUBITAL Performed at [facility] [address], [place], [place] [address]    Special Requests   Final    BOTTLES DRAWN AEROBIC AND ANAEROBIC Blood Culture adequate volume Performed at [facility] [address], [place], [place] [address]    Culture  Setup Time   Final    GRAM POSITIVE COCCI IN CHAINS IN BOTH AEROBIC AND ANAEROBIC BOTTLES CRITICAL RESULT CALLED TO, READ BACK BY AND VERIFIED WITH: PHARMD M RENZ 121020 AT 908 AM  BY CM Performed at [facility], [place] [address] [place], [place] [address]    Culture STREPTOCOCCUS GROUP F (A)  Final   Report Status 10/14/2019 FINAL  Final   Organism ID, Bacteria STREPTOCOCCUS GROUP F  Final      Susceptibility   Streptococcus group f - MIC*    PENICILLIN 0.25 INTERMEDIATE Intermediate     CEFTRIAXONE 1 SENSITIVE Sensitive     ERYTHROMYCIN <=0.12 SENSITIVE Sensitive     LEVOFLOXACIN 0.5 SENSITIVE Sensitive     VANCOMYCIN 0.5 SENSITIVE Sensitive     * STREPTOCOCCUS GROUP F  Blood Culture ID Panel (Reflexed)     Status: Abnormal   Collection Time: 10/11/19  7:15 AM  Result Value Ref Range Status   Enterococcus species NOT DETECTED NOT DETECTED  Final   Listeria monocytogenes NOT DETECTED NOT DETECTED Final   Staphylococcus species NOT DETECTED NOT DETECTED Final   Staphylococcus aureus (BCID) NOT DETECTED NOT DETECTED Final   Streptococcus species DETECTED (A) NOT DETECTED Final    Comment: Not Enterococcus species, Streptococcus agalactiae, Streptococcus pyogenes, or Streptococcus pneumoniae. CRITICAL RESULT CALLED TO, READ BACK BY AND VERIFIED WITH: PHARMD M RENZ 121020 AT 908 BY CM    Streptococcus agalactiae NOT DETECTED NOT DETECTED Final   Streptococcus pneumoniae NOT DETECTED NOT DETECTED Final   Streptococcus pyogenes NOT DETECTED NOT DETECTED Final   Acinetobacter baumannii NOT DETECTED NOT DETECTED Final   Enterobacteriaceae species  NOT DETECTED NOT DETECTED Final   Enterobacter cloacae complex NOT DETECTED NOT DETECTED Final   Escherichia coli NOT DETECTED NOT DETECTED Final   Klebsiella oxytoca NOT DETECTED NOT DETECTED Final   Klebsiella pneumoniae NOT DETECTED NOT DETECTED Final   Proteus species NOT DETECTED NOT DETECTED Final   Serratia marcescens NOT DETECTED NOT DETECTED Final   Haemophilus influenzae NOT DETECTED NOT DETECTED Final   Neisseria meningitidis NOT DETECTED NOT DETECTED Final   Pseudomonas aeruginosa NOT DETECTED NOT DETECTED Final   Candida albicans NOT DETECTED NOT DETECTED Final   Candida glabrata NOT DETECTED NOT DETECTED Final   Candida krusei NOT DETECTED NOT DETECTED Final   Candida parapsilosis NOT DETECTED NOT DETECTED Final   Candida tropicalis NOT DETECTED NOT DETECTED Final    Comment: Performed at [facility], [place] [address] [place], [place] [address]  Urine culture     Status: None   Collection Time: 10/11/19  6:41 PM   Specimen: In/Out Cath Urine  Result Value Ref Range Status   Specimen Description   Final    IN/OUT CATH URINE Performed at [facility], [place] [address] [place], [place] [address]    Special Requests   Final    NONE Performed at  [facility] [place] [address], [place], [place] [address]    Culture   Final    NO GROWTH Performed at [facility], [place] [address] [place], [place] [address]    Report Status 10/12/2019 FINAL  Final  Aerobic/Anaerobic Culture (surgical/deep wound)     Status: None (Preliminary result)   Collection Time: 10/12/19  8:58 AM   Specimen: Abscess  Result Value Ref Range Status   Specimen Description   Final    ABSCESS ABDOMEN Performed at [facility] [address] [place], [place] [address]    Special Requests   Final    NONE Performed at [facility], [place] [address], [place], [place] [address]    Gram Stain   Final    ABUNDANT WBC PRESENT, PREDOMINANTLY PMN MODERATE GRAM NEGATIVE COCCOBACILLI Performed at [facility], [place] [address] [place], [place] [address]    Culture   Final    ABUNDANT VIRIDANS STREPTOCOCCUS FEW KLEBSIELLA PNEUMONIAE NO ANAEROBES ISOLATED; CULTURE IN PROGRESS FOR 5 DAYS    Report Status PENDING  Incomplete   Organism ID, Bacteria VIRIDANS STREPTOCOCCUS  Final   Organism ID, Bacteria KLEBSIELLA PNEUMONIAE  Final      Susceptibility   Klebsiella pneumoniae - MIC*    AMPICILLIN RESISTANT Resistant     CEFAZOLIN <=4 SENSITIVE Sensitive     CEFEPIME <=1 SENSITIVE Sensitive     CEFTAZIDIME <=1 SENSITIVE Sensitive     CEFTRIAXONE <=1 SENSITIVE Sensitive     CIPROFLOXACIN <=0.25 SENSITIVE Sensitive     GENTAMICIN <=1 SENSITIVE Sensitive     IMIPENEM 0.5 SENSITIVE Sensitive     TRIMETH/SULFA <=20 SENSITIVE Sensitive     AMPICILLIN/SULBACTAM 4 SENSITIVE Sensitive     PIP/TAZO <=4 SENSITIVE Sensitive     * FEW KLEBSIELLA PNEUMONIAE   Viridans streptococcus - MIC*    PENICILLIN 0.25 INTERMEDIATE Intermediate     CEFTRIAXONE 1 SENSITIVE Sensitive     ERYTHROMYCIN <=0.12 SENSITIVE Sensitive     LEVOFLOXACIN 0.5 SENSITIVE Sensitive     VANCOMYCIN 0.5 SENSITIVE Sensitive     * ABUNDANT  VIRIDANS STREPTOCOCCUS  Culture, blood (routine x 2)     Status:  None (Preliminary result)   Collection Time: 10/13/19  5:18 AM   Specimen: BLOOD  Result Value Ref Range Status   Specimen Description   Final    BLOOD RIGHT WRIST Performed at [facility] [address], [place], [place] [address]    Special Requests   Final    BOTTLES DRAWN AEROBIC AND ANAEROBIC Blood Culture adequate volume Performed at [facility] [address] [place], [place] [address]    Culture   Final    NO GROWTH 3 DAYS Performed at [facility], [place] [address] [place], [place] [address]    Report Status PENDING  Incomplete  Culture, blood (routine x 2)     Status: None (Preliminary result)   Collection Time: 10/13/19  5:18 AM   Specimen: BLOOD  Result Value Ref Range Status   Specimen Description   Final    BLOOD RIGHT ARM Performed at [facility] [address], [place], [place] [address]    Special Requests   Final    BOTTLES DRAWN AEROBIC AND ANAEROBIC Blood Culture adequate volume Performed at [facility] [address] [place], [place] [address]    Culture   Final    NO GROWTH 3 DAYS Performed at [facility], [place] [address] [place], [place] [address]    Report Status PENDING  Incomplete     Time coordinating discharge: Over 30 minutes  SIGNED:   Vicenta Dunning, MD  [facility] 10/16/2019, 7:05 PM Pager   If 7PM-7AM, please contact night-coverage [email or web address] Password [email or web address]

## 2019-10-16 NOTE — Progress Notes (Signed)
Hemoglobin essentially unchanged from yesterday morning despite transfusion of 1 unit of packed cells yesterday evening.    However, stool was Hemoccult negative as of early this morning.    Over the past 96 hours, the patient's BUN level has dropped from 33 to 8.  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  He did not have any obvious change in recent GI symptoms prior to his severe weakness with anemia 5 days ago which prompted this hospitalization.  Impression: The overall picture is most consistent with a recent, but now resolved, subacute GI bleed from the upper tract.  At the moment, he is stable from the GI tract standpoint.  Recommendations:  1.  Will change from IV Protonix to oral Protonix  2.  The patient indicates he is unwilling to stay in the hospital any longer, because he has "reached his limit."  3.  One remaining question is whether the patient is felt to require ongoing anticoagulation (was on Eliquis prior to admission).  Since the patient has an elevated INR, presumably as a consequence of liver metastases, he may not need exogenous anticoagulation, and/or he may need correction of the elevated INR with vitamin K.  4.  I think the patient would benefit from endoscopic evaluation, particularly if a decision is made to resume therapeutic anticoagulation.  I reviewed in general the nature and purpose of such a procedure, with which he is somewhat familiar from his previous ERCPs.  However, since the patient has already eaten breakfast this morning, he is not a candidate for elective endoscopy today.  He is aware that, if he goes home from the hospital (which he is insistent on doing, albeit in a very pleasant manner), there could be significant delays in getting his procedure done because  of need for preprocedural Covid testing and the medical preference to have it done at a hospital facility rather than a freestanding endoscopy unit in view of this medical complexity.  We will be on standby to assist in his care as an outpatient upon your request, or if somehow a decision is made that he will remain in the hospital, we could probably get around to doing an endoscopy on him tomorrow, if you wish.    5. Please contact us if we can be of further assistance in the care of this pleasant but unfortunate patient.  Cleotis Nipper, M.D. Pager [phone] If no answer or after 5 PM call [phone]

## 2019-10-16 NOTE — Progress Notes (Signed)
Chief Complaint: Patient was seen today for biliary drain and RUQ abscess drain  Supervising Physician: Jacqulynn Cadet  Patient Status: [facility] - In-pt  Subjective: S/p perc RUQ abscess drain on 12/10, followed PTC with Int/Ext bili drain on 12/11 Pt feeling ok, anxious to go home. Some soreness on right side as expected.  Objective: Physical Exam: BP 131/74 (BP Location: Left Arm)   Pulse 82   Temp 98.2 F (36.8 C) (Oral)   Resp 20   Ht '6\' 4"'$  (1.93 m)   Wt 87.6 kg   SpO2 97%   BMI 23.51 kg/m  Bili drain intact, site clean. Bilious output with some debris in bag. Flushed easily RUQ abscess drain intact, site clean. Serosanguinous output.   Current Facility-Administered Medications:  .  0.9 %  sodium chloride infusion (Manually program via Guardrails IV Fluids), , Intravenous, Once, [place], Bing Neighbors, NP .  0.9 %  sodium chloride infusion, , Intravenous, Continuous, Truddie Hidden, MD, Last Rate: 125 mL/hr at 10/15/19 2241, New Bag at 10/15/19 2241 .  acetaminophen (TYLENOL) tablet 650 mg, 650 mg, Oral, Q8H PRN, Truddie Hidden, MD, 650 mg at 10/16/19 0344 .  B-complex with vitamin C tablet 1 tablet, 1 tablet, Oral, Daily, Truddie Hidden, MD, 1 tablet at 10/16/19 0946 .  cefTRIAXone (ROCEPHIN) 2 g in sodium chloride 0.9 % 100 mL IVPB, 2 g, Intravenous, Q24H, Comer, Okey Regal, MD, Last Rate: 200 mL/hr at 10/15/19 2243, 2 g at 10/15/19 2243 .  Chlorhexidine Gluconate Cloth 2 % PADS 6 each, 6 each, Topical, Daily, Truddie Hidden, MD, 6 each at 10/16/19 1042 .  famotidine (PEPCID) tablet 10 mg, 10 mg, Oral, BID PRN, Truddie Hidden, MD .  ferrous sulfate tablet 325 mg, 325 mg, Oral, BID WC, Truddie Hidden, MD, 325 mg at 10/16/19 0801 .  insulin aspart (novoLOG) injection 0-15 Units, 0-15 Units, Subcutaneous, TID WC, Shawna Clamp, MD, 11 Units at 10/16/19 1153 .  insulin aspart (novoLOG) injection 0-5 Units, 0-5 Units, Subcutaneous, QHS, Shawna Clamp, MD, 4  Units at 10/15/19 2056 .  lidocaine (LIDODERM) 5 % 1 patch, 1 patch, Transdermal, QHS, Truddie Hidden, MD, 1 patch at 10/15/19 2232 .  lipase/protease/amylase (CREON) capsule 36,000 Units, 36,000 Units, Oral, TID Lamar Laundry, MD, 36,000 Units at 10/16/19 1152 .  Melatonin TABS 10 mg, 10 mg, Oral, QHS PRN, Truddie Hidden, MD, 10 mg at 10/13/19 2305 .  ondansetron (ZOFRAN-ODT) disintegrating tablet 4 mg, 4 mg, Oral, Q6H PRN, Truddie Hidden, MD .  oxyCODONE (Oxy IR/ROXICODONE) immediate release tablet 5 mg, 5 mg, Oral, Q6H PRN, Truddie Hidden, MD, 5 mg at 10/15/19 2230 .  pantoprazole (PROTONIX) EC tablet 40 mg, 40 mg, Oral, BID AC, Buccini, Robert, MD, 40 mg at 10/16/19 0956 .  phosphorus (K PHOS NEUTRAL) tablet 250 mg, 250 mg, Oral, TID, Shawna Clamp, MD, 250 mg at 10/16/19 0947 .  prochlorperazine (COMPAZINE) tablet 10 mg, 10 mg, Oral, Q6H PRN, Truddie Hidden, MD .  sodium chloride flush (NS) 0.9 % injection 10-40 mL, 10-40 mL, Intracatheter, Q12H, Truddie Hidden, MD, 10 mL at 10/16/19 1232 .  sodium chloride flush (NS) 0.9 % injection 5 mL, 5 mL, Intracatheter, Q8H, Arne Cleveland, MD, 5 mL at 10/15/19 2200  Labs: CBC Recent Labs    10/15/19 0500 10/16/19 0028 10/16/19 0500  WBC 10.2  --  9.3  HGB 7.7* 8.3* 7.9*  HCT 23.7* 25.5* 24.7*  PLT 200  --  176   BMET Recent Labs  10/15/19 0500 10/16/19 0500  NA 131* 132*  K 3.7 3.9  CL 103 104  CO2 21* 21*  GLUCOSE 211* 264*  BUN 8 8  CREATININE 0.72 0.69  CALCIUM 7.5* 7.5*   LFT Recent Labs    10/16/19 0500  PROT 5.4*  ALBUMIN 1.4*  AST 35  ALT 52*  ALKPHOS 401*  BILITOT 10.2*   PT/INR No results for input(s): LABPROT, INR in the last 72 hours.   Studies/Results: No results found.  Assessment/Plan: Metastatic pancreatic carcinoma s/p Whipple. Rising bili with ductal dilatation. RUQ fluis collection. S/p perc RUQ abscess drain on 12/10, followed PTC with Int/Ext bili drain on 12/11 Bili  still trending up despite drain Hopefully bili starts to trend down though has extensive metastatic disease. Pt and wife familiar with drain care. Recommend once daily flush of each drain. Keep clean dressing and change daily/prn IR will set up pt for outpt follow up.    LOS: 5 days   I spent a total of 20 minutes in face to face in clinical consultation, greater than 50% of which was counseling/coordinating care for PTC bili and RUQ abscess drains  Ascencion Dike PA-C 10/16/2019 1:28 PM'

## 2019-10-16 NOTE — Progress Notes (Signed)
Inpatient Diabetes Program Recommendations  AACE/ADA: New Consensus Statement on Inpatient Glycemic Control (2015)  Target Ranges:  Prepandial:   less than 140 mg/dL      Peak postprandial:   less than 180 mg/dL (1-2 hours)      Critically ill patients:  140 - 180 mg/dL   Lab Results  Component Value Date   GLUCAP 247 (H) 10/16/2019   HGBA1C 8.4 (H) 10/11/2019    Review of Glycemic Control Results for AIKAM, ELBAZ" (MRN [id number]) as of 10/16/2019 09:12  Ref. Range 10/14/2019 07:50 10/14/2019 11:40 10/14/2019 16:51 10/14/2019 20:32  Glucose-Capillary Latest Ref Range: 70 - 99 mg/dL 189 (H) 307 (H) 314 (H) 339 (H)   Results for REIN, HEIS" (MRN [id number]) as of 10/16/2019 09:12  Ref. Range 10/15/2019 07:41 10/15/2019 14:44 10/15/2019 17:15 10/15/2019 20:33 10/16/2019 07:51  Glucose-Capillary Latest Ref Range: 70 - 99 mg/dL 195 (H) 437 (H) 519 (HH) 337 (H) 247 (H)   Diabetes history: DM 2 Outpatient Diabetes medications: Amaryl 4 mg Daily, Humalog 3-13 units bid as needed for glucose >150, Basaglar 19 units qhs, Metformin 1000 mg bid, Actos 45 mg Daily Current orders for Inpatient glycemic control:  Novolog 0-15 units tid + hs  Inpatient Diabetes Program Recommendations:    Fasting glucose elevated. Consider Lantus 8-10 units Q 24 hours (1/2 home dose).  Glucose increases significantly after meals into the 400-500 range. Consider Novolog 5 units tid meal coverage in addition to correction scale with parameters.  (pt eating at least 50% of meals and glucose is at least 80 mg/dl.)  Thanks,  Tama Headings RN, MSN, BC-ADM Inpatient Diabetes Coordinator Team Pager [phone] (8a-5p)

## 2019-10-17 ENCOUNTER — Telehealth: Payer: Self-pay | Admitting: Oncology

## 2019-10-17 ENCOUNTER — Telehealth: Payer: Self-pay | Admitting: *Deleted

## 2019-10-17 ENCOUNTER — Other Ambulatory Visit: Payer: Self-pay | Admitting: Nurse Practitioner

## 2019-10-17 DIAGNOSIS — C250 Malignant neoplasm of head of pancreas: Secondary | ICD-10-CM

## 2019-10-17 LAB — TYPE AND SCREEN
ABO/RH(D): O POS
Antibody Screen: NEGATIVE
Unit division: 0
Unit division: 0
Unit division: 0
Unit division: 0
Unit division: 0

## 2019-10-17 LAB — BPAM RBC
Blood Product Expiration Date: 202101092359
Blood Product Expiration Date: 202101112359
Blood Product Expiration Date: 202101122359
Blood Product Expiration Date: 202101122359
Blood Product Expiration Date: 202101122359
ISSUE DATE / TIME: 202012110848
ISSUE DATE / TIME: 202012111212
ISSUE DATE / TIME: 202012130819
ISSUE DATE / TIME: 202012131803
Unit Type and Rh: 5100
Unit Type and Rh: 5100
Unit Type and Rh: 5100
Unit Type and Rh: 5100
Unit Type and Rh: 5100

## 2019-10-17 NOTE — Telephone Encounter (Signed)
Scheduled appt per 12/15 sch message - pt is aware of appt date and time   

## 2019-10-17 NOTE — Telephone Encounter (Signed)
Asking why they have not received a call from [facility] regarding his referral? Per Dr. Benay Spice, they have tried to reach out to patient via his cell his cell with not success.  Called and left wife VM with [phone] opt 2 to call [facility] to make the appointment. Suggested she leave her cell # as a return call to ensure they get through. Dr. Benay Spice will email Dr. Reynaldo Minium today and let him know Mr. Belshaw is out of the hospital.

## 2019-10-18 LAB — CULTURE, BLOOD (ROUTINE X 2)
Culture: NO GROWTH
Culture: NO GROWTH
Special Requests: ADEQUATE
Special Requests: ADEQUATE

## 2019-10-19 LAB — AEROBIC/ANAEROBIC CULTURE W GRAM STAIN (SURGICAL/DEEP WOUND)

## 2019-10-20 ENCOUNTER — Inpatient Hospital Stay: Payer: Managed Care, Other (non HMO)

## 2019-10-20 ENCOUNTER — Other Ambulatory Visit: Payer: Self-pay

## 2019-10-20 ENCOUNTER — Inpatient Hospital Stay (HOSPITAL_BASED_OUTPATIENT_CLINIC_OR_DEPARTMENT_OTHER): Payer: Managed Care, Other (non HMO) | Admitting: Oncology

## 2019-10-20 VITALS — BP 123/78 | HR 112 | Temp 98.0°F | Resp 18 | Ht 76.0 in | Wt 207.6 lb

## 2019-10-20 DIAGNOSIS — Z7901 Long term (current) use of anticoagulants: Secondary | ICD-10-CM | POA: Diagnosis not present

## 2019-10-20 DIAGNOSIS — C250 Malignant neoplasm of head of pancreas: Secondary | ICD-10-CM | POA: Diagnosis present

## 2019-10-20 DIAGNOSIS — E1122 Type 2 diabetes mellitus with diabetic chronic kidney disease: Secondary | ICD-10-CM | POA: Diagnosis not present

## 2019-10-20 DIAGNOSIS — Z86718 Personal history of other venous thrombosis and embolism: Secondary | ICD-10-CM | POA: Diagnosis not present

## 2019-10-20 DIAGNOSIS — G620 Drug-induced polyneuropathy: Secondary | ICD-10-CM | POA: Diagnosis not present

## 2019-10-20 DIAGNOSIS — Z5111 Encounter for antineoplastic chemotherapy: Secondary | ICD-10-CM | POA: Diagnosis present

## 2019-10-20 DIAGNOSIS — I129 Hypertensive chronic kidney disease with stage 1 through stage 4 chronic kidney disease, or unspecified chronic kidney disease: Secondary | ICD-10-CM | POA: Diagnosis not present

## 2019-10-20 DIAGNOSIS — Z794 Long term (current) use of insulin: Secondary | ICD-10-CM | POA: Diagnosis not present

## 2019-10-20 DIAGNOSIS — D649 Anemia, unspecified: Secondary | ICD-10-CM | POA: Diagnosis not present

## 2019-10-20 DIAGNOSIS — Z95828 Presence of other vascular implants and grafts: Secondary | ICD-10-CM

## 2019-10-20 DIAGNOSIS — N189 Chronic kidney disease, unspecified: Secondary | ICD-10-CM | POA: Diagnosis not present

## 2019-10-20 DIAGNOSIS — C787 Secondary malignant neoplasm of liver and intrahepatic bile duct: Secondary | ICD-10-CM | POA: Diagnosis present

## 2019-10-20 DIAGNOSIS — F17210 Nicotine dependence, cigarettes, uncomplicated: Secondary | ICD-10-CM | POA: Diagnosis not present

## 2019-10-20 LAB — CMP (CANCER CENTER ONLY)
ALT: 45 U/L — ABNORMAL HIGH (ref 0–44)
AST: 81 U/L — ABNORMAL HIGH (ref 15–41)
Albumin: 1.5 g/dL — ABNORMAL LOW (ref 3.5–5.0)
Alkaline Phosphatase: 477 U/L — ABNORMAL HIGH (ref 38–126)
Anion gap: 12 (ref 5–15)
BUN: 13 mg/dL (ref 6–20)
CO2: 21 mmol/L — ABNORMAL LOW (ref 22–32)
Calcium: 7.9 mg/dL — ABNORMAL LOW (ref 8.9–10.3)
Chloride: 101 mmol/L (ref 98–111)
Creatinine: 0.8 mg/dL (ref 0.61–1.24)
GFR, Est AFR Am: >60 mL/min (ref >60)
GFR, Estimated: >60 mL/min (ref >60)
Glucose, Bld: 159 mg/dL — ABNORMAL HIGH (ref 70–99)
Potassium: 4.3 mmol/L (ref 3.5–5.1)
Sodium: 134 mmol/L — ABNORMAL LOW (ref 135–145)
Total Bilirubin: 12.1 mg/dL (ref 0.3–1.2)
Total Protein: 6.4 g/dL — ABNORMAL LOW (ref 6.5–8.1)

## 2019-10-20 LAB — CBC WITH DIFFERENTIAL (CANCER CENTER ONLY)
Abs Immature Granulocytes: 0.34 10*3/uL — ABNORMAL HIGH (ref 0.00–0.07)
Basophils Absolute: 0.1 10*3/uL (ref 0.0–0.1)
Basophils Relative: 1 %
Eosinophils Absolute: 0.1 10*3/uL (ref 0.0–0.5)
Eosinophils Relative: 1 %
HCT: 29 % — ABNORMAL LOW (ref 39.0–52.0)
Hemoglobin: 9.4 g/dL — ABNORMAL LOW (ref 13.0–17.0)
Immature Granulocytes: 2 %
Lymphocytes Relative: 4 %
Lymphs Abs: 0.5 10*3/uL — ABNORMAL LOW (ref 0.7–4.0)
MCH: 28.1 pg (ref 26.0–34.0)
MCHC: 32.4 g/dL (ref 30.0–36.0)
MCV: 86.8 fL (ref 80.0–100.0)
Monocytes Absolute: 0.8 10*3/uL (ref 0.1–1.0)
Monocytes Relative: 5 %
Neutro Abs: 13.2 10*3/uL — ABNORMAL HIGH (ref 1.7–7.7)
Neutrophils Relative %: 87 %
Platelet Count: 284 10*3/uL (ref 150–400)
RBC: 3.34 MIL/uL — ABNORMAL LOW (ref 4.22–5.81)
RDW: 21.6 % — ABNORMAL HIGH (ref 11.5–15.5)
WBC Count: 15 10*3/uL — ABNORMAL HIGH (ref 4.0–10.5)
nRBC: 0 % (ref 0.0–0.2)

## 2019-10-20 MED ORDER — SODIUM CHLORIDE 0.9% FLUSH
10.0000 mL | INTRAVENOUS | Status: DC | PRN
  Administered 2019-10-20: 10 mL
  Filled 2019-10-20: qty 10

## 2019-10-20 MED ORDER — PROCHLORPERAZINE MALEATE 10 MG PO TABS: 10.0000 mg | ORAL_TABLET | Freq: Four times a day (QID) | ORAL | 0 refills | Status: AC | PRN

## 2019-10-20 MED ORDER — HEPARIN SOD (PORK) LOCK FLUSH 100 UNIT/ML IV SOLN
500.0000 [IU] | Freq: Once | INTRAVENOUS | Status: AC | PRN
  Administered 2019-10-20: 11:00:00 500 [IU]
  Filled 2019-10-20: qty 5

## 2019-10-20 NOTE — Progress Notes (Signed)
MD is aware of total bili = 12.1 (seeing patient today)

## 2019-10-20 NOTE — Progress Notes (Addendum)
[facility] OFFICE PROGRESS NOTE   Diagnosis: Pancreas cancer  INTERVAL HISTORY:   Brian Torres was discharged from hospital on 10/16/2019 after an admission with bacteremia and an abdominal abscess.  He underwent placement of a biliary drain in the hospital.  He reports no further fever.  He is completing a course of Augmentin.  He continues to have intermittent back pain.  His chief complaint is leg edema.  This has partially improved since discharge from type.  His blood sugar has been in the 70s for the past few days.  He has increased the nighttime insulin dose.  Objective:  Vital signs in last 24 hours:  Blood pressure 123/78, pulse (!) 112, temperature 98 F (36.7 C), temperature source Temporal, resp. rate 18, height '6\' 4"'$  (1.93 m), weight 207 lb 9.6 oz (94.2 kg), SpO2 100 %.    HEENT: Scleral icterus GI: Right upper abdomen biliary abscess drains, hepatomegaly, distended with ascites Vascular: 1+ edema of the lower leg bilaterally  Skin: Jaundice  Portacath/PICC-without erythema  Lab Results:  Lab Results  Component Value Date   WBC 15.0 (H) 10/20/2019   HGB 9.4 (L) 10/20/2019   HCT 29.0 (L) 10/20/2019   MCV 86.8 10/20/2019   PLT 284 10/20/2019   NEUTROABS 13.2 (H) 10/20/2019    CMP  Lab Results  Component Value Date   NA 134 (L) 10/20/2019   K 4.3 10/20/2019   CL 101 10/20/2019   CO2 21 (L) 10/20/2019   GLUCOSE 159 (H) 10/20/2019   BUN 13 10/20/2019   CREATININE 0.80 10/20/2019   CALCIUM 7.9 (L) 10/20/2019   PROT 6.4 (L) 10/20/2019   ALBUMIN 1.5 (L) 10/20/2019   AST 81 (H) 10/20/2019   ALT 45 (H) 10/20/2019   ALKPHOS 477 (H) 10/20/2019   BILITOT 12.1 (HH) 10/20/2019   GFRNONAA >60 10/20/2019   GFRAA >60 10/20/2019     Medications: I have reviewed the patient's current medications.   Assessment/Plan: 1. Pancreas head mass  Presented with jaundice, bilirubin elevated at 21.4  CTs abdomen/pelvis 12/20/2018-severe intrahepatic and  extrahepatic biliary dilatation due to a possible 3.5 x 2.5 cm solid mass in the pancreatic head. Pancreatic ductal dilatation noted as well with multiple cystic lesions in the pancreatic tail and body.   CT chest 12/20/2018-occasional nonspecific small pulmonary nodules felt to likely be related to prior infection or inflammation.   ERCP by Dr. Watt Climes 12/21/2018. The major papilla was on the rim of a diverticulum. The minor papilla appeared to be bulging. A biopsy of the duodenum adjacent to the minor papilla was performed. Plastic stent was placed into the ventral pancreatic duct. Biliary sphincterotomy performed. Covered metal stent placed into the common bile duct. Bile duct brushings showed benign reactive/reparative changes. Biopsy of polypoid duodenal mucosa suggestive of nodular peptic duodenitis, negative for dysplasia or malignancy.   Upper EUS by Dr. Paulita Fujita on 01/03/2019 showed a few cystic lesions in the pancreatic body and pancreatic tail. A stent was visualized in the common bile duct. Mass identified in the pancreatic head with fine-needle aspiration performed. Lesion appeared to abut or potentially superficially invade the superior mesenteric vein. No lymphadenopathy. No involvement of the superior mesenteric artery or celiac artery. Pathology returned suspicious for malignancy. Dr. Paulita Fujita notes if pathology shows adenocarcinoma the lesion would be staged T3 N0 MX.  Repeat ERCP and placement of a metal bile duct stent 01/24/2019  EUS 01/24/2019, T3N0 pancreas head mass with abutment of the SMV, FNA of the pancreas  mass revealed adenocarcinoma, cytology from the bile duct stent revealed adenocarcinoma  Cycle 1 FOLFIRINOX4/04/2019  Cycle 2 FOLFIRINOX4/20/2020, Udenyca added  Cycle 3 FOLFIRINOX 03/09/2019, Irinotecan and 5-fluorouracil dose reduced, Udenyca held  Cycle 4 FOLFIRINOX 03/21/2019-Udenyca held  Cycle 5 FOLFIRINOX6/01/2019  Cycle 6 FOLFIRINOX 04/24/2019  (oxaliplatin dose reduced due to poor tolerance of chemotherapy)  CT 05/11/2019-decreased size of pancreas head mass, unchanged peripancreatic lymph node, no evidence of metastatic disease, improved/resolved fluid collections at the pancreas tail  Cycle 7 FOLFIRINOX 05/22/2019  Cycle 8 FOLFIRINOX 06/06/2019  07/20/2019 pancreaticoduodenectomy 07/20/2019-pT2, pN0 (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 Treatment effect absent, extensive residual cancer with no evident tumor regression)  MRI abdomen 09/05/2019-reaccumulation of fluid along the perihepatic margin following presumed removal of the pigtail drainage catheter that was present in this area on the study of 08/24/2019; some fluid in the region of the porta hepatis and pancreaticobiliary limb; small collection of fluid just inferior to the interlobar fissure in the liver along the course of the previous drain; tract extending from this area to the midline abdominal wound; innumerable areas of abnormality seen on T2 and diffusion throughout the liver, largest in the range of 1 cm. All areas of the liver affected, both right and left hepatic lobes. Along the gallbladder fossa is a peripherally enhancing 1.8 x 1.3 cm focus.  Ultrasound-guided biopsy of a liver lesion 09/26/2019-adenocarcinoma compatible with pancreatic adenocarcinoma, MSS, tumor mutation burden 3, ERBB2 amplification, KRAS G12D 2. Biliary obstruction status post stent placement 3. History of left lower extremity DVT 2017, 2018-on Eliquis. 4. Type 2 diabetes 5. Hypertension 6. Chronic kidney disease 7. Tobacco use 8. Anemia, macrocytic. Transfused 2 units of blood 12/20/2018.On oral iron. Improved 02/01/2019 and 02/06/2019. 9. Torres-A-Cath  placement 02/02/2019, Dr. Barry Dienes 10. 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 Hospitalized 08/07/2019 through 08/11/2019 with postsurgical intra-abdominal fluid collection. CT 08/07/2019 with 2 rim-enhancing fluid collections in the right upper quadrant concerning for abscesses, moderate wall thickening and pericolonic inflammatory changes involving the distal ascending, transverse and descending colon consistent with colitis. Blood culture 08/07/2019 + for Prevotella buccae beta-lactamase positive; status post CT-guided aspiration of intra-abdominal fluid collection and drain placement on 08/09/2019, culture with multiple organisms present, none predominant; follow-up CT 08/24/2019 with resolution of dominant fluid collection within the right upper abdominal quadrant, tiny serpiginous fluid collection within the ventral aspect of the right upper abdomen, interval development of multiple ill-defined hypoattenuating liver lesions; MRI abdomen 09/05/2019 reaccumulation of fluid along the perihepatic margin following presumed removal of the pigtail drainage catheter that was present in this area on the study of 08/24/2019; some fluid in the region of the porta hepatis and pancreaticobiliary limb; small collection of fluid just inferior to the interlobar fissure in the liver along the course of the previous drain; tract extending from this area to the midline abdominal wound; innumerable areas of abnormality seen on T2 and diffusion throughout the liver, largest in the range of 1 cm. All areas of the liver affected, both right and left hepatic lobes. Along the gallbladder fossa is a peripherally enhancing 1.8 x 1.3 cm focus. [facility] admission 10/11/2019-sepsis, blood cultures positive for a Streptococcus species, likely source is right upper quadrant abscess 14. Hyperbilirubinemia-status post placement of a right biliary system drain  10/13/2019  Disposition: Brian Torres appears stable.  He continues to have severe hyperbilirubinemia.  This will limit his ability to receive chemotherapy.  He is not a candidate for Abraxane with the bilirubin at this level.  He would like to proceed with systemic therapy.  We will plan for single agent gemcitabine beginning on 10/23/2019.  Attempted to contact his insurance company today as they have requested a peer to peer discussion in order to improve the gemcitabine/Abraxane regimen.  We have scheduled a peer-to-peer discussion for the early a.m. on 10/23/2019. Brian Torres understands the potential increased risk of toxicity with gemcitabine in the setting of hyperbilirubinemia.  He agrees to proceed.  I discussed the gemcitabine dosing with the Cancer center pharmacy.  He will be referred for a palliative paracentesis.  Brian Torres is scheduled to see Dr. Reynaldo Minium on 11/02/2019.  I recommended she contact Dr. Leonides Schanz to discuss management of diabetes.  He may require less her diabetic regimen given the extensive liver disease and malnutrition.  Brian Torres will be seen for an office visit prior to chemotherapy on 10/23/2019.  Brian Torres was taken off of anticoagulation during the recent hospitalization secondary to anemia.  Reports no bleeding.  He will resume apixaban anticoagulation.  He is at high risk for venous thromboembolic disease in the setting of pancreas cancer.  Betsy Coder, MD  10/20/2019  3:05 PM

## 2019-10-23 ENCOUNTER — Encounter: Payer: Self-pay | Admitting: Nurse Practitioner

## 2019-10-23 ENCOUNTER — Inpatient Hospital Stay (HOSPITAL_BASED_OUTPATIENT_CLINIC_OR_DEPARTMENT_OTHER): Payer: Managed Care, Other (non HMO) | Admitting: Nurse Practitioner

## 2019-10-23 ENCOUNTER — Inpatient Hospital Stay: Payer: Managed Care, Other (non HMO)

## 2019-10-23 ENCOUNTER — Other Ambulatory Visit: Payer: Self-pay | Admitting: Oncology

## 2019-10-23 ENCOUNTER — Other Ambulatory Visit: Payer: Self-pay

## 2019-10-23 VITALS — BP 125/80 | HR 101 | Temp 98.9°F | Resp 17 | Ht 76.0 in | Wt 206.5 lb

## 2019-10-23 VITALS — HR 114

## 2019-10-23 DIAGNOSIS — C250 Malignant neoplasm of head of pancreas: Secondary | ICD-10-CM

## 2019-10-23 DIAGNOSIS — Z5111 Encounter for antineoplastic chemotherapy: Secondary | ICD-10-CM | POA: Diagnosis not present

## 2019-10-23 DIAGNOSIS — Z95828 Presence of other vascular implants and grafts: Secondary | ICD-10-CM

## 2019-10-23 LAB — CBC WITH DIFFERENTIAL (CANCER CENTER ONLY)
Abs Immature Granulocytes: 0.18 10*3/uL — ABNORMAL HIGH (ref 0.00–0.07)
Basophils Absolute: 0.1 10*3/uL (ref 0.0–0.1)
Basophils Relative: 1 %
Eosinophils Absolute: 0.5 10*3/uL (ref 0.0–0.5)
Eosinophils Relative: 4 %
HCT: 29.9 % — ABNORMAL LOW (ref 39.0–52.0)
Hemoglobin: 9.6 g/dL — ABNORMAL LOW (ref 13.0–17.0)
Immature Granulocytes: 1 %
Lymphocytes Relative: 8 %
Lymphs Abs: 1.1 10*3/uL (ref 0.7–4.0)
MCH: 28.7 pg (ref 26.0–34.0)
MCHC: 32.1 g/dL (ref 30.0–36.0)
MCV: 89.3 fL (ref 80.0–100.0)
Monocytes Absolute: 0.9 10*3/uL (ref 0.1–1.0)
Monocytes Relative: 7 %
Neutro Abs: 10.9 10*3/uL — ABNORMAL HIGH (ref 1.7–7.7)
Neutrophils Relative %: 79 %
Platelet Count: 322 10*3/uL (ref 150–400)
RBC: 3.35 MIL/uL — ABNORMAL LOW (ref 4.22–5.81)
RDW: 23.7 % — ABNORMAL HIGH (ref 11.5–15.5)
WBC Count: 13.7 10*3/uL — ABNORMAL HIGH (ref 4.0–10.5)
nRBC: 0 % (ref 0.0–0.2)

## 2019-10-23 LAB — CMP (CANCER CENTER ONLY)
ALT: 53 U/L — ABNORMAL HIGH (ref 0–44)
AST: 87 U/L — ABNORMAL HIGH (ref 15–41)
Albumin: 1.5 g/dL — ABNORMAL LOW (ref 3.5–5.0)
Alkaline Phosphatase: 512 U/L — ABNORMAL HIGH (ref 38–126)
Anion gap: 13 (ref 5–15)
BUN: 15 mg/dL (ref 6–20)
CO2: 20 mmol/L — ABNORMAL LOW (ref 22–32)
Calcium: 8.2 mg/dL — ABNORMAL LOW (ref 8.9–10.3)
Chloride: 101 mmol/L (ref 98–111)
Creatinine: 0.74 mg/dL (ref 0.61–1.24)
GFR, Est AFR Am: >60 mL/min (ref >60)
GFR, Estimated: >60 mL/min (ref >60)
Glucose, Bld: 85 mg/dL (ref 70–99)
Potassium: 4.4 mmol/L (ref 3.5–5.1)
Sodium: 134 mmol/L — ABNORMAL LOW (ref 135–145)
Total Bilirubin: 11.6 mg/dL (ref 0.3–1.2)
Total Protein: 6.7 g/dL (ref 6.5–8.1)

## 2019-10-23 MED ORDER — PROCHLORPERAZINE MALEATE 10 MG PO TABS
ORAL_TABLET | ORAL | Status: AC
  Filled 2019-10-23: qty 1

## 2019-10-23 MED ORDER — HEPARIN SOD (PORK) LOCK FLUSH 100 UNIT/ML IV SOLN
500.0000 [IU] | Freq: Once | INTRAVENOUS | Status: AC | PRN
  Administered 2019-10-23: 500 [IU]
  Filled 2019-10-23: qty 5

## 2019-10-23 MED ORDER — SODIUM CHLORIDE 0.9 % IV SOLN
Freq: Once | INTRAVENOUS | Status: AC
  Filled 2019-10-23: qty 250

## 2019-10-23 MED ORDER — SODIUM CHLORIDE 0.9 % IV SOLN
500.0000 mg/m2 | Freq: Once | INTRAVENOUS | Status: AC
  Administered 2019-10-23: 1064 mg via INTRAVENOUS
  Filled 2019-10-23: qty 27.98

## 2019-10-23 MED ORDER — PROCHLORPERAZINE MALEATE 10 MG PO TABS
10.0000 mg | ORAL_TABLET | Freq: Once | ORAL | Status: AC
  Administered 2019-10-23: 10 mg via ORAL

## 2019-10-23 MED ORDER — SODIUM CHLORIDE 0.9% FLUSH
10.0000 mL | INTRAVENOUS | Status: DC | PRN
  Administered 2019-10-23: 12:00:00 10 mL
  Filled 2019-10-23: qty 10

## 2019-10-23 MED ORDER — SODIUM CHLORIDE 0.9% FLUSH
10.0000 mL | INTRAVENOUS | Status: DC | PRN
  Administered 2019-10-23: 10 mL
  Filled 2019-10-23: qty 10

## 2019-10-23 NOTE — Patient Instructions (Signed)

## 2019-10-23 NOTE — Progress Notes (Signed)
CRITICAL VALUE STICKER  CRITICAL VALUE: T-bili = 11.5  RECEIVER (on-site recipient of call): Merceda Elks, RN  [place] NOTIFIED: 10/23/19 @ [id number]  MESSENGER (representative from lab): Lelan Pons  MD NOTIFIED: Dr. Benay Spice  TIME OF NOTIFICATIONCQ:[id number]  RESPONSE: Stable.

## 2019-10-23 NOTE — Patient Instructions (Signed)
[facility] Discharge Instructions for Patients Receiving Chemotherapy  Today you received the following chemotherapy agents Gemcitabine  To help prevent nausea and vomiting after your treatment, we encourage you to take your nausea medication as directed.   If you develop nausea and vomiting that is not controlled by your nausea medication, call the clinic.   BELOW ARE SYMPTOMS THAT SHOULD BE REPORTED IMMEDIATELY:  *FEVER GREATER THAN 100.5 F  *CHILLS WITH OR WITHOUT FEVER  NAUSEA AND VOMITING THAT IS NOT CONTROLLED WITH YOUR NAUSEA MEDICATION  *UNUSUAL SHORTNESS OF BREATH  *UNUSUAL BRUISING OR BLEEDING  TENDERNESS IN MOUTH AND THROAT WITH OR WITHOUT PRESENCE OF ULCERS  *URINARY PROBLEMS  *BOWEL PROBLEMS  UNUSUAL RASH Items with * indicate a potential emergency and should be followed up as soon as possible.  Feel free to call the clinic should you have any questions or concerns. The clinic phone number is [phone].  Please show the CHEMO ALERT CARD at check-in to the Emergency Department and triage nurse.  Gemcitabine injection What is this medicine? GEMCITABINE (jem SYE ta been) is a chemotherapy drug. This medicine is used to treat many types of cancer like breast cancer, lung cancer, pancreatic cancer, and ovarian cancer. This medicine may be used for other purposes; ask your health care provider or pharmacist if you have questions. COMMON BRAND NAME(S): Gemzar, Infugem What should I tell my health care provider before I take this medicine? They need to know if you have any of these conditions:  blood disorders  infection  kidney disease  liver disease  lung or breathing disease, like asthma  recent or ongoing radiation therapy  an unusual or allergic reaction to gemcitabine, other chemotherapy, other medicines, foods, dyes, or preservatives  pregnant or trying to get pregnant  breast-feeding How should I use this medicine? This drug  is given as an infusion into a vein. It is administered in a hospital or clinic by a specially trained health care professional. Talk to your pediatrician regarding the use of this medicine in children. Special care may be needed. Overdosage: If you think you have taken too much of this medicine contact a poison control center or emergency room at once. NOTE: This medicine is only for you. Do not share this medicine with others. What if I miss a dose? It is important not to miss your dose. Call your doctor or health care professional if you are unable to keep an appointment. What may interact with this medicine?  medicines to increase blood counts like filgrastim, pegfilgrastim, sargramostim  some other chemotherapy drugs like cisplatin  vaccines Talk to your doctor or health care professional before taking any of these medicines:  acetaminophen  aspirin  ibuprofen  ketoprofen  naproxen This list may not describe all possible interactions. Give your health care provider a list of all the medicines, herbs, non-prescription drugs, or dietary supplements you use. Also tell them if you smoke, drink alcohol, or use illegal drugs. Some items may interact with your medicine. What should I watch for while using this medicine? Visit your doctor for checks on your progress. This drug may make you feel generally unwell. This is not uncommon, as chemotherapy can affect healthy cells as well as cancer cells. Report any side effects. Continue your course of treatment even though you feel ill unless your doctor tells you to stop. In some cases, you may be given additional medicines to help with side effects. Follow all directions for their use. Call   your doctor or health care professional for advice if you get a fever, chills or sore throat, or other symptoms of a cold or flu. Do not treat yourself. This drug decreases your body's ability to fight infections. Try to avoid being around people who are  sick. This medicine may increase your risk to bruise or bleed. Call your doctor or health care professional if you notice any unusual bleeding. Be careful brushing and flossing your teeth or using a toothpick because you may get an infection or bleed more easily. If you have any dental work done, tell your dentist you are receiving this medicine. Avoid taking products that contain aspirin, acetaminophen, ibuprofen, naproxen, or ketoprofen unless instructed by your doctor. These medicines may hide a fever. Do not become pregnant while taking this medicine or for 6 months after stopping it. Women should inform their doctor if they wish to become pregnant or think they might be pregnant. Men should not father a child while taking this medicine and for 3 months after stopping it. There is a potential for serious side effects to an unborn child. Talk to your health care professional or pharmacist for more information. Do not breast-feed an infant while taking this medicine or for at least 1 week after stopping it. Men should inform their doctors if they wish to father a child. This medicine may lower sperm counts. Talk with your doctor or health care professional if you are concerned about your fertility. What side effects may I notice from receiving this medicine? Side effects that you should report to your doctor or health care professional as soon as possible:  allergic reactions like skin rash, itching or hives, swelling of the face, lips, or tongue  breathing problems  pain, redness, or irritation at site where injected  signs and symptoms of a dangerous change in heartbeat or heart rhythm like chest pain; dizziness; fast or irregular heartbeat; palpitations; feeling faint or lightheaded, falls; breathing problems  signs of decreased platelets or bleeding - bruising, pinpoint red spots on the skin, black, tarry stools, blood in the urine  signs of decreased red blood cells - unusually weak or  tired, feeling faint or lightheaded, falls  signs of infection - fever or chills, cough, sore throat, pain or difficulty passing urine  signs and symptoms of kidney injury like trouble passing urine or change in the amount of urine  signs and symptoms of liver injury like dark yellow or brown urine; general ill feeling or flu-like symptoms; light-colored stools; loss of appetite; nausea; right upper belly pain; unusually weak or tired; yellowing of the eyes or skin  swelling of ankles, feet, hands Side effects that usually do not require medical attention (report to your doctor or health care professional if they continue or are bothersome):  constipation  diarrhea  hair loss  loss of appetite  nausea  rash  vomiting This list may not describe all possible side effects. Call your doctor for medical advice about side effects. You may report side effects to FDA at 1-800-FDA-1088. Where should I keep my medicine? This drug is given in a hospital or clinic and will not be stored at home. NOTE: This sheet is a summary. It may not cover all possible information. If you have questions about this medicine, talk to your doctor, pharmacist, or health care provider.  2020 Elsevier/Gold Standard (2018-01-12 18:06:11)  

## 2019-10-23 NOTE — Progress Notes (Signed)
Per Dr. Benay Spice patient is Ok to treat with a pulse of 114

## 2019-10-23 NOTE — Progress Notes (Addendum)
[place] OFFICE PROGRESS NOTE   Diagnosis: Pancreas cancer  INTERVAL HISTORY:   [name] returns as scheduled.  He continues to have abdominal distention.  He describes the distention is "uncomfortable".  He denies diarrhea.  He has 3-4 bowel movements a day.  Some nausea.  No vomiting.  He reports a.m. blood sugar has been less than 100.  He denies fever.  Objective:  Vital signs in last 24 hours:  Blood pressure 125/80, pulse (!) 101, temperature 98.9 F (37.2 C), temperature source Temporal, resp. rate 17, height '6\' 4"'$  (1.93 m), weight 206 lb 8 oz (93.7 kg), SpO2 100 %.    HEENT: No thrush or ulcers.  Mucous membranes appear moist.  Scleral icterus. GI: Abdomen is distended.  He appears to have ascites.  Right upper abdomen biliary abscess drain. Vascular: Pitting edema below the knees bilaterally. Neuro: Alert and oriented. Skin: Jaundice. Port-A-Cath without erythema.   Lab Results:  Lab Results  Component Value Date   WBC 13.7 (H) 10/23/2019   HGB 9.6 (L) 10/23/2019   HCT 29.9 (L) 10/23/2019   MCV 89.3 10/23/2019   PLT 322 10/23/2019   NEUTROABS 10.9 (H) 10/23/2019    Imaging:  No results found.  Medications: I have reviewed the patient's current medications.  Assessment/Plan: 1. Pancreas head mass  Presented with jaundice, bilirubin elevated at 21.4  CTs abdomen/pelvis 12/20/2018-severe intrahepatic and extrahepatic biliary dilatation due to a possible 3.5 x 2.5 cm solid mass in the pancreatic head. Pancreatic ductal dilatation noted as well with multiple cystic lesions in the pancreatic tail and body.   CT chest 12/20/2018-occasional nonspecific small pulmonary nodules felt to likely be related to prior infection or inflammation.   ERCP by Dr. Watt Climes 12/21/2018. The major papilla was on the rim of a diverticulum. The minor papilla appeared to be bulging. A biopsy of the duodenum adjacent to the minor papilla was performed. Plastic  stent was placed into the ventral pancreatic duct. Biliary sphincterotomy performed. Covered metal stent placed into the common bile duct. Bile duct brushings showed benign reactive/reparative changes. Biopsy of polypoid duodenal mucosa suggestive of nodular peptic duodenitis, negative for dysplasia or malignancy.   Upper EUS by Dr. Paulita Fujita on 01/03/2019 showed a few cystic lesions in the pancreatic body and pancreatic tail. A stent was visualized in the common bile duct. Mass identified in the pancreatic head with fine-needle aspiration performed. Lesion appeared to abut or potentially superficially invade the superior mesenteric vein. No lymphadenopathy. No involvement of the superior mesenteric artery or celiac artery. Pathology returned suspicious for malignancy. Dr. Paulita Fujita notes if pathology shows adenocarcinoma the lesion would be staged T3 N0 MX.  Repeat ERCP and placement of a metal bile duct stent 01/24/2019  EUS 01/24/2019, T3N0 pancreas head mass with abutment of the SMV, FNA of the pancreas mass revealed adenocarcinoma, cytology from the bile duct stent revealed adenocarcinoma  Cycle 1 FOLFIRINOX4/04/2019  Cycle 2 FOLFIRINOX4/20/2020, Udenyca added  Cycle 3 FOLFIRINOX 03/09/2019, Irinotecan and 5-fluorouracil dose reduced, Udenyca held  Cycle 4 FOLFIRINOX 03/21/2019-Udenyca held  Cycle 5 FOLFIRINOX6/01/2019  Cycle 6 FOLFIRINOX 04/24/2019 (oxaliplatin dose reduced due to poor tolerance of chemotherapy)  CT 05/11/2019-decreased size of pancreas head mass, unchanged peripancreatic lymph node, no evidence of metastatic disease, improved/resolved fluid collections at the pancreas tail  Cycle 7 FOLFIRINOX 05/22/2019  Cycle 8 FOLFIRINOX 06/06/2019  07/20/2019 pancreaticoduodenectomy 07/20/2019-pT2, pN0 (3.6 cm poorly differentiated invasive ductal adenocarcinoma; tumor invades duodenal wall, peripancreatic wall and superior mesenteric groove; perineural  and lymphovascular space invasion  present; margins negative; chronic cholecystitis; no carcinoma identified in 8 lymph nodes. Treatment effect absent, extensive residual cancer with no evident tumor regression)  MRI abdomen 09/05/2019-reaccumulation of fluid along the perihepatic margin following presumed removal of the pigtail drainage catheter that was present in this area on the study of 08/24/2019; some fluid in the region of the porta hepatis and pancreaticobiliary limb; small collection of fluid just inferior to the interlobar fissure in the liver along the course of the previous drain; tract extending from this area to the midline abdominal wound; innumerable areas of abnormality seen on T2 and diffusion throughout the liver, largest in the range of 1 cm. All areas of the liver affected, both right and left hepatic lobes. Along the gallbladder fossa is a peripherally enhancing 1.8 x 1.3 cm focus.  Ultrasound-guided biopsy of a liver lesion 09/26/2019-adenocarcinoma compatible with pancreatic adenocarcinoma, MSS, tumor mutation burden 3, ERBB2 amplification, KRAS G12D 2. Biliary obstruction status post stent placement 3. History of left lower extremity DVT 2017, 2018-on Eliquis. 4. Type 2 diabetes 5. Hypertension 6. Chronic kidney disease 7. Tobacco use 8. Anemia, macrocytic. Transfused 2 units of blood 12/20/2018.On oral iron. Improved 02/01/2019 and 02/06/2019. 9. Port-A-Cath placement 02/02/2019, Dr. Barry Dienes 10. 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 Hospitalized 08/07/2019 through 08/11/2019 with postsurgical intra-abdominal fluid collection. CT 08/07/2019 with 2 rim-enhancing fluid collections in the right upper quadrant concerning for abscesses, moderate wall thickening and pericolonic inflammatory changes involving the distal ascending, transverse and descending colon consistent with colitis. Blood culture 08/07/2019 + for Prevotella buccae  beta-lactamase positive; status post CT-guided aspiration of intra-abdominal fluid collection and drain placement on 08/09/2019, culture with multiple organisms present, none predominant; follow-up CT 08/24/2019 with resolution of dominant fluid collection within the right upper abdominal quadrant, tiny serpiginous fluid collection within the ventral aspect of the right upper abdomen, interval development of multiple ill-defined hypoattenuating liver lesions; MRI abdomen 09/05/2019 reaccumulation of fluid along the perihepatic margin following presumed removal of the pigtail drainage catheter that was present in this area on the study of 08/24/2019; some fluid in the region of the porta hepatis and pancreaticobiliary limb; small collection of fluid just inferior to the interlobar fissure in the liver along the course of the previous drain; tract extending from this area to the midline abdominal wound; innumerable areas of abnormality seen on T2 and diffusion throughout the liver, largest in the range of 1 cm. All areas of the liver affected, both right and left hepatic lobes. Along the gallbladder fossa is a peripherally enhancing 1.8 x 1.3 cm focus. [facility] admission 10/11/2019-sepsis, blood cultures positive for a Streptococcus species, likely source is right upper quadrant abscess 14. Hyperbilirubinemia-status post placement of a right biliary system drain 10/13/2019   Disposition: Mr. Jeansonne appears unchanged.  He is scheduled to begin treatment with single agent gemcitabine today.  We again reviewed potential toxicities.  He agrees to proceed.  We reviewed the CBC and chemistry panel from today, ok for treatment.  Bilirubin remains markedly elevated.  He will contact Dr. Addison Lank to discuss management of diabetes.  He will return for lab, follow-up, gemcitabine in 1 week.  He will contact the office in the interim with any problems.  Patient seen with Dr. Benay Spice.    [name] ANP/GNP-BC    10/23/2019  9:44 AM  This was a shared visit with [name].  Mr. Montalto appears stable.  He has persistent hyperbilirubinemia.  He has been referred for a paracentesis this week.  We discussed diabetes management.  We recommended he contact Dr. Leonides Schanz.  He may require less insulin with his current nutritional status and extensive liver metastases.  He will be scheduled to see Dr. Reynaldo Minium for a second opinion.  The plan is to proceed with dose reduced gemcitabine today.  Julieanne Manson, MD

## 2019-10-24 ENCOUNTER — Telehealth: Payer: Self-pay | Admitting: *Deleted

## 2019-10-25 ENCOUNTER — Other Ambulatory Visit: Payer: Self-pay

## 2019-10-25 ENCOUNTER — Other Ambulatory Visit: Payer: Self-pay | Admitting: Radiology

## 2019-10-25 ENCOUNTER — Ambulatory Visit (HOSPITAL_COMMUNITY)
Admission: RE | Admit: 2019-10-25 | Discharge: 2019-10-25 | Disposition: A | Discharge status: Home / Self Care | Payer: Managed Care, Other (non HMO) | Source: Ambulatory Visit | Attending: Radiology | Admitting: Radiology

## 2019-10-25 ENCOUNTER — Ambulatory Visit (HOSPITAL_COMMUNITY)
Admission: RE | Admit: 2019-10-25 | Discharge: 2019-10-25 | Disposition: A | Discharge status: Home / Self Care | Payer: Managed Care, Other (non HMO) | Source: Ambulatory Visit | Attending: Oncology | Admitting: Oncology

## 2019-10-25 DIAGNOSIS — C259 Malignant neoplasm of pancreas, unspecified: Secondary | ICD-10-CM

## 2019-10-25 DIAGNOSIS — C250 Malignant neoplasm of head of pancreas: Secondary | ICD-10-CM | POA: Diagnosis present

## 2019-10-25 DIAGNOSIS — R188 Other ascites: Secondary | ICD-10-CM | POA: Insufficient documentation

## 2019-10-25 HISTORY — PX: IR CHOLANGIOGRAM EXISTING TUBE: IMG6040

## 2019-10-25 HISTORY — PX: IR PARACENTESIS: IMG2679

## 2019-10-25 HISTORY — PX: IR CM INJ ANY COLONIC TUBE W/FLUORO: IMG2336

## 2019-10-25 MED ORDER — IOHEXOL 300 MG/ML  SOLN
50.0000 mL | Freq: Once | INTRAMUSCULAR | Status: AC | PRN
  Administered 2019-10-25: 5 mL

## 2019-10-25 MED ORDER — LIDOCAINE HCL 1 % IJ SOLN
INTRAMUSCULAR | Status: AC
  Filled 2019-10-25: qty 20

## 2019-10-25 MED ORDER — LIDOCAINE HCL 1 % IJ SOLN
INTRAMUSCULAR | Status: DC | PRN
  Administered 2019-10-25: 5 mL

## 2019-10-25 NOTE — Procedures (Signed)
Ultrasound-guided diagnostic and therapeutic paracentesis performed yielding 1.7  liters of golden yellow fluid. No immediate complications.  A portion of the fluid was submitted to the lab for cytology. EBL < 1 cc.

## 2019-10-26 LAB — CYTOLOGY - NON PAP

## 2019-10-29 ENCOUNTER — Other Ambulatory Visit: Payer: Self-pay | Admitting: Oncology

## 2019-10-30 ENCOUNTER — Telehealth: Payer: Self-pay

## 2019-10-30 ENCOUNTER — Inpatient Hospital Stay: Payer: Managed Care, Other (non HMO)

## 2019-10-30 ENCOUNTER — Inpatient Hospital Stay (HOSPITAL_BASED_OUTPATIENT_CLINIC_OR_DEPARTMENT_OTHER): Payer: Managed Care, Other (non HMO) | Admitting: Nurse Practitioner

## 2019-10-30 ENCOUNTER — Other Ambulatory Visit: Payer: Self-pay | Admitting: Nurse Practitioner

## 2019-10-30 ENCOUNTER — Other Ambulatory Visit: Payer: Self-pay

## 2019-10-30 ENCOUNTER — Encounter: Payer: Self-pay | Admitting: Nurse Practitioner

## 2019-10-30 VITALS — BP 117/74 | HR 107 | Temp 98.9°F | Resp 16 | Ht 76.0 in | Wt 205.6 lb

## 2019-10-30 DIAGNOSIS — Z95828 Presence of other vascular implants and grafts: Secondary | ICD-10-CM

## 2019-10-30 DIAGNOSIS — C250 Malignant neoplasm of head of pancreas: Secondary | ICD-10-CM | POA: Diagnosis not present

## 2019-10-30 DIAGNOSIS — Z5111 Encounter for antineoplastic chemotherapy: Secondary | ICD-10-CM | POA: Diagnosis not present

## 2019-10-30 LAB — CBC WITH DIFFERENTIAL (CANCER CENTER ONLY)
Abs Immature Granulocytes: 0.44 10*3/uL — ABNORMAL HIGH (ref 0.00–0.07)
Basophils Absolute: 0 10*3/uL (ref 0.0–0.1)
Basophils Relative: 0 %
Eosinophils Absolute: 0.1 10*3/uL (ref 0.0–0.5)
Eosinophils Relative: 1 %
HCT: 26.2 % — ABNORMAL LOW (ref 39.0–52.0)
Hemoglobin: 8.4 g/dL — ABNORMAL LOW (ref 13.0–17.0)
Immature Granulocytes: 5 %
Lymphocytes Relative: 12 %
Lymphs Abs: 1.1 10*3/uL (ref 0.7–4.0)
MCH: 29.5 pg (ref 26.0–34.0)
MCHC: 32.1 g/dL (ref 30.0–36.0)
MCV: 91.9 fL (ref 80.0–100.0)
Monocytes Absolute: 0.7 10*3/uL (ref 0.1–1.0)
Monocytes Relative: 7 %
Neutro Abs: 6.6 10*3/uL (ref 1.7–7.7)
Neutrophils Relative %: 75 %
Platelet Count: 91 10*3/uL — ABNORMAL LOW (ref 150–400)
RBC: 2.85 MIL/uL — ABNORMAL LOW (ref 4.22–5.81)
RDW: 23.9 % — ABNORMAL HIGH (ref 11.5–15.5)
WBC Count: 8.9 10*3/uL (ref 4.0–10.5)
nRBC: 0.6 % — ABNORMAL HIGH (ref 0.0–0.2)

## 2019-10-30 LAB — CMP (CANCER CENTER ONLY)
ALT: 77 U/L — ABNORMAL HIGH (ref 0–44)
AST: 102 U/L — ABNORMAL HIGH (ref 15–41)
Albumin: 1.4 g/dL — ABNORMAL LOW (ref 3.5–5.0)
Alkaline Phosphatase: 508 U/L — ABNORMAL HIGH (ref 38–126)
Anion gap: 14 (ref 5–15)
BUN: 35 mg/dL — ABNORMAL HIGH (ref 6–20)
CO2: 17 mmol/L — ABNORMAL LOW (ref 22–32)
Calcium: 9 mg/dL (ref 8.9–10.3)
Chloride: 102 mmol/L (ref 98–111)
Creatinine: 1.15 mg/dL (ref 0.61–1.24)
GFR, Est AFR Am: >60 mL/min (ref >60)
GFR, Estimated: >60 mL/min (ref >60)
Glucose, Bld: 202 mg/dL — ABNORMAL HIGH (ref 70–99)
Potassium: 5 mmol/L (ref 3.5–5.1)
Sodium: 133 mmol/L — ABNORMAL LOW (ref 135–145)
Total Bilirubin: 13.2 mg/dL (ref 0.3–1.2)
Total Protein: 6.5 g/dL (ref 6.5–8.1)

## 2019-10-30 MED ORDER — ONDANSETRON 4 MG PO TBDP: 4.0000 mg | ORAL_TABLET | Freq: Four times a day (QID) | ORAL | 2 refills | Status: AC | PRN

## 2019-10-30 MED ORDER — SODIUM CHLORIDE 0.9% FLUSH
10.0000 mL | INTRAVENOUS | Status: DC | PRN
  Administered 2019-10-30: 15:00:00 10 mL
  Filled 2019-10-30: qty 10

## 2019-10-30 MED ORDER — HEPARIN SOD (PORK) LOCK FLUSH 100 UNIT/ML IV SOLN
500.0000 [IU] | Freq: Once | INTRAVENOUS | Status: AC | PRN
  Administered 2019-10-30: 500 [IU]
  Filled 2019-10-30: qty 5

## 2019-10-30 NOTE — Telephone Encounter (Signed)
Per [name] NP sent schedule message for schedulers to schedule patient for doctor appointment with Lattie Haw today @3 :15p. Also let them know that she will Need LABS and FLUSH before doctors visit

## 2019-10-30 NOTE — Telephone Encounter (Signed)
Returned TC to patients wife Brian Torres [phone]) in regard to her  Question about her husband not being scheduled for infusion tomorrow when he should be getting it weekly. I let her know that [name] NP put in her note for him to be scheduled, but now he is scheduled for tomorrow 10/31/19 @ 1:15p for chemo. She also stated that she would like for [name] or [name] to give her a call. I gave [name] Np her contact information.

## 2019-10-30 NOTE — Telephone Encounter (Signed)
Per [name] NP called and scheduled patient for paracentesis tomorrow 10/31/19 @ 10 am he is to arrive by 9:45a. Patient is aware appointment time and location.

## 2019-10-30 NOTE — Telephone Encounter (Signed)
Returned TC to patients wife Brian Torres. No answer. Left voicemail for her to call back [facility]

## 2019-10-30 NOTE — Progress Notes (Addendum)
[facility] OFFICE PROGRESS NOTE   Diagnosis: Pancreas cancer  INTERVAL HISTORY:   Mr. Allum returns prior to scheduled follow-up.  He completed a cycle of gemcitabine on 10/23/2019.  He continues to have nausea.  This predates gemcitabine.  He slept for several hours after the first cycle of gemcitabine.  No fever or rash.  He feels short of breath " from fluid".  He had a paracentesis 10/25/2019 with 1.7 L of fluid removed.  He reports rapid reaccumulation of the fluid within a day.  Objective:  Vital signs in last 24 hours:  Blood pressure 117/74, pulse (!) 107, temperature 98.9 F (37.2 C), temperature source Temporal, resp. rate 16, height '6\' 4"'$  (1.93 m), weight 205 lb 9.6 oz (93.3 kg), SpO2 100 %.    HEENT: Scleral icterus.  Thrush. GI: Abdomen is significantly distended.  Biliary drain right abdomen.  Soft external hemorrhoids. Vascular: Pitting edema throughout both legs. Neuro: Alert and oriented. Skin: Jaundiced. Port-A-Cath without erythema.  Lab Results:  Lab Results  Component Value Date   WBC 8.9 10/30/2019   HGB 8.4 (L) 10/30/2019   HCT 26.2 (L) 10/30/2019   MCV 91.9 10/30/2019   PLT 91 (L) 10/30/2019   NEUTROABS 6.6 10/30/2019    Imaging:  No results found.  Medications: I have reviewed the patient's current medications.  Assessment/Plan: 1. Pancreas head mass  Presented with jaundice, bilirubin elevated at 21.4  CTs abdomen/pelvis 12/20/2018-severe intrahepatic and extrahepatic biliary dilatation due to a possible 3.5 x 2.5 cm solid mass in the pancreatic head. Pancreatic ductal dilatation noted as well with multiple cystic lesions in the pancreatic tail and body.   CT chest 12/20/2018-occasional nonspecific small pulmonary nodules felt to likely be related to prior infection or inflammation.   ERCP by Dr. Watt Climes 12/21/2018. The major papilla was on the rim of a diverticulum. The minor papilla appeared to be bulging. A biopsy of  the duodenum adjacent to the minor papilla was performed. Plastic stent was placed into the ventral pancreatic duct. Biliary sphincterotomy performed. Covered metal stent placed into the common bile duct. Bile duct brushings showed benign reactive/reparative changes. Biopsy of polypoid duodenal mucosa suggestive of nodular peptic duodenitis, negative for dysplasia or malignancy.   Upper EUS by Dr. Paulita Fujita on 01/03/2019 showed a few cystic lesions in the pancreatic body and pancreatic tail. A stent was visualized in the common bile duct. Mass identified in the pancreatic head with fine-needle aspiration performed. Lesion appeared to abut or potentially superficially invade the superior mesenteric vein. No lymphadenopathy. No involvement of the superior mesenteric artery or celiac artery. Pathology returned suspicious for malignancy. Dr. Paulita Fujita notes if pathology shows adenocarcinoma the lesion would be staged T3 N0 MX.  Repeat ERCP and placement of a metal bile duct stent 01/24/2019  EUS 01/24/2019, T3N0 pancreas head mass with abutment of the SMV, FNA of the pancreas mass revealed adenocarcinoma, cytology from the bile duct stent revealed adenocarcinoma  Cycle 1 FOLFIRINOX4/04/2019  Cycle 2 FOLFIRINOX4/20/2020, Udenyca added  Cycle 3 FOLFIRINOX 03/09/2019, Irinotecan and 5-fluorouracil dose reduced, Udenyca held  Cycle 4 FOLFIRINOX 03/21/2019-Udenyca held  Cycle 5 FOLFIRINOX6/01/2019  Cycle 6 FOLFIRINOX 04/24/2019 (oxaliplatin dose reduced due to poor tolerance of chemotherapy)  CT 05/11/2019-decreased size of pancreas head mass, unchanged peripancreatic lymph node, no evidence of metastatic disease, improved/resolved fluid collections at the pancreas tail  Cycle 7 FOLFIRINOX 05/22/2019  Cycle 8 FOLFIRINOX 06/06/2019  07/20/2019 pancreaticoduodenectomy 07/20/2019-pT2, pN0 (3.6 cm poorly differentiated invasive ductal adenocarcinoma; tumor invades  duodenal wall, peripancreatic wall and  superior mesenteric groove; perineural and lymphovascular space invasion present; margins negative; chronic cholecystitis; no carcinoma identified in 8 lymph nodes. Treatment effect absent, extensive residual cancer with no evident tumor regression)  MRI abdomen 09/05/2019-reaccumulation of fluid along the perihepatic margin following presumed removal of the pigtail drainage catheter that was present in this area on the study of 08/24/2019; some fluid in the region of the porta hepatis and pancreaticobiliary limb; small collection of fluid just inferior to the interlobar fissure in the liver along the course of the previous drain; tract extending from this area to the midline abdominal wound; innumerable areas of abnormality seen on T2 and diffusion throughout the liver, largest in the range of 1 cm. All areas of the liver affected, both right and left hepatic lobes. Along the gallbladder fossa is a peripherally enhancing 1.8 x 1.3 cm focus.  Ultrasound-guided biopsy of a liver lesion 09/26/2019-adenocarcinoma compatible with pancreatic adenocarcinoma, MSS, tumor mutation burden 3,ERBB2amplification, KRAS G12D  Gemcitabine 10/23/2019  Paracentesis 10/25/2019-malignant cells consistent with metastatic adenocarcinoma. 2. Biliary obstruction status post stent placement 3. History of left lower extremity DVT 2017, 2018-on Eliquis. 4. Type 2 diabetes 5. Hypertension 6. Chronic kidney disease 7. Tobacco use 8. Anemia, macrocytic. Transfused 2 units of blood 12/20/2018.On oral iron. Improved 02/01/2019 and 02/06/2019. 9. Port-A-Cath placement 02/02/2019, Dr. Barry Dienes 10. 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 Hospitalized 08/07/2019 through 08/11/2019 with postsurgical intra-abdominal fluid collection. CT 08/07/2019 with 2 rim-enhancing fluid collections in the right upper quadrant concerning for abscesses, moderate wall  thickening and pericolonic inflammatory changes involving the distal ascending, transverse and descending colon consistent with colitis. Blood culture 08/07/2019 + for Prevotella buccae beta-lactamase positive; status post CT-guided aspiration of intra-abdominal fluid collection and drain placement on 08/09/2019, culture with multiple organisms present, none predominant; follow-up CT 08/24/2019 with resolution of dominant fluid collection within the right upper abdominal quadrant, tiny serpiginous fluid collection within the ventral aspect of the right upper abdomen, interval development of multiple ill-defined hypoattenuating liver lesions; MRI abdomen 09/05/2019 reaccumulation of fluid along the perihepatic margin following presumed removal of the pigtail drainage catheter that was present in this area on the study of 08/24/2019; some fluid in the region of the porta hepatis and pancreaticobiliary limb; small collection of fluid just inferior to the interlobar fissure in the liver along the course of the previous drain; tract extending from this area to the midline abdominal wound; innumerable areas of abnormality seen on T2 and diffusion throughout the liver, largest in the range of 1 cm. All areas of the liver affected, both right and left hepatic lobes. Along the gallbladder fossa is a peripherally enhancing 1.8 x 1.3 cm focus. [facility] admission 10/11/2019-sepsis, blood cultures positive for a Streptococcus species, likely source is right upper quadrant abscess 14. Hyperbilirubinemia-status post placement of a right biliary system drain 10/13/2019.  Cholangiogram through existing catheter 10/25/2019-stable position of the internal/external biliary drain.  Cholangiogram is similar to the initial placement images.  Minimal filling of the intrahepatic bile duct suggestive for multifocal disease within the liver.   Disposition: Mr. Malone has metastatic pancreas cancer.  He completed a cycle of  gemcitabine 10/23/2019.  He underwent a paracentesis 10/25/2019.  Cytology showed malignant cells consistent with metastatic adenocarcinoma.  We discussed the cytology report with Mr. Economou.  His wife was present by phone for today's appointment.  They understand the low likelihood that the cancer  will respond to further chemotherapy.  Dr. Benay Spice discussed a supportive/comfort care approach with Mr. Bielby and his wife.  Mr. Glace would like to continue gemcitabine.    We reviewed the labs from today.  The bilirubin is higher.  He has mild thrombocytopenia related to the chemotherapy.  We are canceling the chemotherapy planned 10/31/2019 and rescheduling to 11/07/2019.  He has significant abdominal distention due to malignant ascites.  We are referring him for a therapeutic paracentesis 10/31/2019.  He will return for lab, follow-up, gemcitabine on 11/07/2019.  He will contact the office in the interim with any problems.  Patient seen with Dr. Benay Spice.  25 minutes were spent face-to-face at today's visit with the majority of that time involved in counseling/coordination of care.    [name] ANP/GNP-BC   10/30/2019  3:51 PM  This was a shared visit with [name].  Mr. Suess was seen in and examined.  His wife was present by telephone for today's visit.  Mr. Harcum has metastatic pancreas cancer.  He has hyperbilirubinemia anemia secondary to intrahepatic biliary obstruction.  The right hepatic drain has not helped.  I discussed the case with interventional radiology last week.  They feel it is unlikely the drain will help the hyperbilirubinemia and plan to remove the drain at the next visit.  He completed 1 treatment with gemcitabine.  He will not be able to receive Abraxane due to the persistent hyperbilirubinemia.  The platelets are lower today.  Gemcitabine will be held this week.  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  I discussed the poor prognosis  with Mr. Kunz and his wife.  I recommended comfort/hospice care.  Mr. Yepiz indicated he does not wish to enter hospice care at present.  He would like to continue systemic therapy for now.  He will be referred for a palliative paracentesis.  He will return for an office visit with the plan to give another treatment with gemcitabine next week.  Julieanne Manson, MD

## 2019-10-30 NOTE — Telephone Encounter (Signed)
Received Critical lab call from [facility] in lab. Bilirubin 13.2 [name] NP aware

## 2019-10-31 ENCOUNTER — Ambulatory Visit (HOSPITAL_COMMUNITY)
Admission: RE | Admit: 2019-10-31 | Discharge: 2019-10-31 | Disposition: A | Discharge status: Home / Self Care | Payer: Managed Care, Other (non HMO) | Source: Ambulatory Visit | Attending: Nurse Practitioner | Admitting: Nurse Practitioner

## 2019-10-31 ENCOUNTER — Other Ambulatory Visit: Payer: Self-pay | Admitting: *Deleted

## 2019-10-31 ENCOUNTER — Inpatient Hospital Stay: Payer: Managed Care, Other (non HMO)

## 2019-10-31 ENCOUNTER — Inpatient Hospital Stay: Payer: Managed Care, Other (non HMO) | Admitting: Nurse Practitioner

## 2019-10-31 ENCOUNTER — Other Ambulatory Visit: Payer: Self-pay | Admitting: Nurse Practitioner

## 2019-10-31 ENCOUNTER — Ambulatory Visit: Payer: Managed Care, Other (non HMO)

## 2019-10-31 DIAGNOSIS — C250 Malignant neoplasm of head of pancreas: Secondary | ICD-10-CM

## 2019-10-31 MED ORDER — LORAZEPAM 0.5 MG PO TABS: 0.5000 mg | ORAL_TABLET | Freq: Three times a day (TID) | ORAL | 0 refills | Status: AC | PRN

## 2019-10-31 MED ORDER — LIDOCAINE HCL 1 % IJ SOLN
INTRAMUSCULAR | Status: AC
  Filled 2019-10-31: qty 20

## 2019-10-31 MED ORDER — CLOTRIMAZOLE 10 MG MT TROC: 10.0000 mg | Freq: Every day | OROMUCOSAL | 0 refills | Status: AC

## 2019-10-31 MED ORDER — LORAZEPAM 0.5 MG PO TABS: 0.5000 mg | ORAL_TABLET | Freq: Three times a day (TID) | ORAL | 0 refills | Status: DC | PRN

## 2019-10-31 NOTE — Progress Notes (Signed)
Informed wife that Mycelex troche script has been sent to pharmacy. Not safe to use the Fluconazole due to liver functions. Ativan was refilled and patient informed Dr. Benay Spice he was only taking #1 oxycodone/day, so adding a long acting pain med is not indicated. Also with liver functions so high, he would not metabolize it well and could cause excess sedation and possibly difficult to arouse him. Says pain is worse at night-wakes up at 0200 in pain. Suggested he keep water and one pain pill at bedside at night to take. Confirmed there are no pets or small children that could take pill off his nightstand.

## 2019-10-31 NOTE — Procedures (Signed)
Ultrasound-guided  therapeutic paracentesis performed yielding 2.6 liters of golden yellow fluid. No immediate complications. EBL none.

## 2019-10-31 NOTE — Telephone Encounter (Signed)
Wife called to inquire if a script for Brian Torres was being called in for him? Patient thought it was mentioned on visit yesterday that he had yeast in his mouth. Also needs refill on lorazepam. Was Dr. Benay Spice going to add a long acting pain medication for him as well?

## 2019-11-02 ENCOUNTER — Telehealth: Payer: Self-pay | Admitting: *Deleted

## 2019-11-02 NOTE — Telephone Encounter (Addendum)
Left message that Dr. Benay Spice put him back on his apixaban 5 mg and is asking if MD wants him to take it bid or daily? Per Dr. Benay Spice: 5 mg bid. Wife notified.

## 2019-11-06 ENCOUNTER — Telehealth: Payer: Self-pay | Admitting: *Deleted

## 2019-11-06 ENCOUNTER — Other Ambulatory Visit: Payer: Self-pay | Admitting: *Deleted

## 2019-11-06 DIAGNOSIS — C250 Malignant neoplasm of head of pancreas: Secondary | ICD-10-CM

## 2019-11-06 NOTE — Telephone Encounter (Addendum)
Called to f/u on status of his next chemo appointment. Also requesting a paracentesis for Scott. Reports he has been having a lot of belching and reflux of gastric contents that causes burning in his esophagus. States she has resumed his florastor and asking if this is OK? Informed wife that scheduling is working on his appointment and paracentesis can be scheduled once we see when he has chemo scheduled. Per Dr. Benay Spice: Rosana Fret will not help his reflux. Would advise to d/c this. Can try Protonix 40 mg daily. Informed wife of MD orders and she will stop the Florastor. Reports he is already taking Protonix 40 mg bid per other physician. Asking what Dr. Benay Spice thinks about the supplement of selenium or "Liver Lovers". Informed her that Dr. Benay Spice does not recommend these supplements as they have not been tested on cancer patients or people undergoing chemotherapy and most are not effective. Informed her we are still trying to get chemo set up for this week.

## 2019-11-06 NOTE — Progress Notes (Signed)
Call to central scheduling and was able to get paracentesis on 1/6 at 1:30 pm--

## 2019-11-07 ENCOUNTER — Telehealth: Payer: Self-pay | Admitting: Oncology

## 2019-11-07 NOTE — Telephone Encounter (Signed)
Added lab/ flush and OV for 1/6 schedule - called pt . Unable to reach pt . Left message with appt date and time

## 2019-11-08 ENCOUNTER — Encounter: Payer: Self-pay | Admitting: *Deleted

## 2019-11-08 ENCOUNTER — Ambulatory Visit (HOSPITAL_COMMUNITY)
Admission: RE | Admit: 2019-11-08 | Discharge: 2019-11-08 | Disposition: A | Discharge status: Home / Self Care | Payer: Managed Care, Other (non HMO) | Source: Ambulatory Visit | Attending: Oncology | Admitting: Oncology

## 2019-11-08 ENCOUNTER — Inpatient Hospital Stay: Payer: Managed Care, Other (non HMO) | Attending: Oncology

## 2019-11-08 ENCOUNTER — Inpatient Hospital Stay (HOSPITAL_BASED_OUTPATIENT_CLINIC_OR_DEPARTMENT_OTHER): Payer: Managed Care, Other (non HMO) | Admitting: Oncology

## 2019-11-08 ENCOUNTER — Inpatient Hospital Stay: Payer: Managed Care, Other (non HMO)

## 2019-11-08 ENCOUNTER — Other Ambulatory Visit: Payer: Self-pay

## 2019-11-08 VITALS — BP 110/72 | HR 102 | Temp 98.2°F | Resp 17 | Ht 76.0 in | Wt 208.3 lb

## 2019-11-08 DIAGNOSIS — R5381 Other malaise: Secondary | ICD-10-CM | POA: Insufficient documentation

## 2019-11-08 DIAGNOSIS — I129 Hypertensive chronic kidney disease with stage 1 through stage 4 chronic kidney disease, or unspecified chronic kidney disease: Secondary | ICD-10-CM | POA: Diagnosis not present

## 2019-11-08 DIAGNOSIS — E1122 Type 2 diabetes mellitus with diabetic chronic kidney disease: Secondary | ICD-10-CM | POA: Diagnosis not present

## 2019-11-08 DIAGNOSIS — C250 Malignant neoplasm of head of pancreas: Secondary | ICD-10-CM

## 2019-11-08 DIAGNOSIS — N189 Chronic kidney disease, unspecified: Secondary | ICD-10-CM | POA: Insufficient documentation

## 2019-11-08 DIAGNOSIS — Z86718 Personal history of other venous thrombosis and embolism: Secondary | ICD-10-CM | POA: Diagnosis not present

## 2019-11-08 DIAGNOSIS — R188 Other ascites: Secondary | ICD-10-CM | POA: Diagnosis not present

## 2019-11-08 DIAGNOSIS — R5383 Other fatigue: Secondary | ICD-10-CM | POA: Diagnosis not present

## 2019-11-08 DIAGNOSIS — Z7901 Long term (current) use of anticoagulants: Secondary | ICD-10-CM | POA: Insufficient documentation

## 2019-11-08 DIAGNOSIS — K30 Functional dyspepsia: Secondary | ICD-10-CM | POA: Insufficient documentation

## 2019-11-08 DIAGNOSIS — Z95828 Presence of other vascular implants and grafts: Secondary | ICD-10-CM

## 2019-11-08 DIAGNOSIS — R600 Localized edema: Secondary | ICD-10-CM | POA: Insufficient documentation

## 2019-11-08 DIAGNOSIS — C787 Secondary malignant neoplasm of liver and intrahepatic bile duct: Secondary | ICD-10-CM | POA: Insufficient documentation

## 2019-11-08 DIAGNOSIS — R17 Unspecified jaundice: Secondary | ICD-10-CM | POA: Insufficient documentation

## 2019-11-08 DIAGNOSIS — Z5111 Encounter for antineoplastic chemotherapy: Secondary | ICD-10-CM | POA: Insufficient documentation

## 2019-11-08 LAB — CMP (CANCER CENTER ONLY)
ALT: 87 U/L — ABNORMAL HIGH (ref 0–44)
AST: 139 U/L — ABNORMAL HIGH (ref 15–41)
Albumin: 1.3 g/dL — ABNORMAL LOW (ref 3.5–5.0)
Alkaline Phosphatase: 674 U/L — ABNORMAL HIGH (ref 38–126)
Anion gap: 15 (ref 5–15)
BUN: 49 mg/dL — ABNORMAL HIGH (ref 6–20)
CO2: 15 mmol/L — ABNORMAL LOW (ref 22–32)
Calcium: 8.3 mg/dL — ABNORMAL LOW (ref 8.9–10.3)
Chloride: 103 mmol/L (ref 98–111)
Creatinine: 1.83 mg/dL — ABNORMAL HIGH (ref 0.61–1.24)
GFR, Est AFR Am: 46 mL/min — ABNORMAL LOW (ref >60)
GFR, Estimated: 40 mL/min — ABNORMAL LOW (ref >60)
Glucose, Bld: 81 mg/dL (ref 70–99)
Potassium: 4.6 mmol/L (ref 3.5–5.1)
Sodium: 133 mmol/L — ABNORMAL LOW (ref 135–145)
Total Bilirubin: 14.4 mg/dL (ref 0.3–1.2)
Total Protein: 6.5 g/dL (ref 6.5–8.1)

## 2019-11-08 LAB — CBC WITH DIFFERENTIAL (CANCER CENTER ONLY)
Abs Immature Granulocytes: 0.27 10*3/uL — ABNORMAL HIGH (ref 0.00–0.07)
Basophils Absolute: 0 10*3/uL (ref 0.0–0.1)
Basophils Relative: 0 %
Eosinophils Absolute: 0.1 10*3/uL (ref 0.0–0.5)
Eosinophils Relative: 1 %
HCT: 28.2 % — ABNORMAL LOW (ref 39.0–52.0)
Hemoglobin: 9.3 g/dL — ABNORMAL LOW (ref 13.0–17.0)
Immature Granulocytes: 2 %
Lymphocytes Relative: 3 %
Lymphs Abs: 0.4 10*3/uL — ABNORMAL LOW (ref 0.7–4.0)
MCH: 31.8 pg (ref 26.0–34.0)
MCHC: 33 g/dL (ref 30.0–36.0)
MCV: 96.6 fL (ref 80.0–100.0)
Monocytes Absolute: 1.5 10*3/uL — ABNORMAL HIGH (ref 0.1–1.0)
Monocytes Relative: 11 %
Neutro Abs: 11.7 10*3/uL — ABNORMAL HIGH (ref 1.7–7.7)
Neutrophils Relative %: 83 %
Platelet Count: 351 10*3/uL (ref 150–400)
RBC: 2.92 MIL/uL — ABNORMAL LOW (ref 4.22–5.81)
RDW: 28.8 % — ABNORMAL HIGH (ref 11.5–15.5)
WBC Count: 14 10*3/uL — ABNORMAL HIGH (ref 4.0–10.5)
nRBC: 0 % (ref 0.0–0.2)

## 2019-11-08 MED ORDER — HEPARIN SOD (PORK) LOCK FLUSH 100 UNIT/ML IV SOLN
500.0000 [IU] | Freq: Once | INTRAVENOUS | Status: DC | PRN
  Filled 2019-11-08: qty 5

## 2019-11-08 MED ORDER — PROCHLORPERAZINE MALEATE 10 MG PO TABS
ORAL_TABLET | ORAL | Status: AC
  Filled 2019-11-08: qty 1

## 2019-11-08 MED ORDER — SODIUM CHLORIDE 0.9 % IV SOLN
500.0000 mg/m2 | Freq: Once | INTRAVENOUS | Status: AC
  Administered 2019-11-08: 1064 mg via INTRAVENOUS
  Filled 2019-11-08: qty 27.98

## 2019-11-08 MED ORDER — SODIUM CHLORIDE 0.9 % IV SOLN
Freq: Once | INTRAVENOUS | Status: AC
  Filled 2019-11-08: qty 250

## 2019-11-08 MED ORDER — PROCHLORPERAZINE MALEATE 10 MG PO TABS
10.0000 mg | ORAL_TABLET | Freq: Once | ORAL | Status: AC
  Administered 2019-11-08: 10 mg via ORAL

## 2019-11-08 MED ORDER — SODIUM CHLORIDE 0.9% FLUSH
10.0000 mL | INTRAVENOUS | Status: DC | PRN
  Filled 2019-11-08: qty 10

## 2019-11-08 MED ORDER — LIDOCAINE HCL 1 % IJ SOLN
INTRAMUSCULAR | Status: AC
  Filled 2019-11-08: qty 20

## 2019-11-08 MED ORDER — SODIUM CHLORIDE 0.9% FLUSH
10.0000 mL | INTRAVENOUS | Status: DC | PRN
  Administered 2019-11-08: 10 mL
  Filled 2019-11-08: qty 10

## 2019-11-08 NOTE — Progress Notes (Signed)
Faxed 11/08/19 labs, med list and office note to PCP, Dr. Cari Caraway with request to see patient asap regarding need to adjust his diabetes meds. Blood sugars have been bottoming out at home. Also called office and left VM with same information and to call office if fax is not received. Faxed to [phone].

## 2019-11-08 NOTE — Progress Notes (Signed)
[facility] OFFICE PROGRESS NOTE   Diagnosis: Pancreas cancer  INTERVAL HISTORY:   Brian Torres returns for scheduled visit.  His wife is present by telephone for today's visit.  He complains of malaise.  He has indigestion and frequent belching.  He is having bowel movements.  No fever.  His appetite and energy level are poor.  He reports nausea.  Brian Torres is ambulatory in the home.  His blood sugar has been low in the mornings.  He continues multiple medications for diabetes.  Objective:  Vital signs in last 24 hours:  Blood pressure 110/72, pulse (!) 102, temperature 98.2 F (36.8 C), temperature source Temporal, resp. rate 17, height '6\' 4"'$  (1.93 m), weight 208 lb 4.8 oz (94.5 kg), SpO2 100 %.    HEENT: No thrush  Resp: Clear bilaterally, no respiratory distress Cardio: Regular rate and rhythm GI: Distended with ascites, right upper quadrant biliary drain, midline incision has almost completely healed with an eschar at the lower incision Vascular: Pitting edema to left greater than right lower leg Neuro: Lethargic, arousable, follows commands Skin: Jaundice  Portacath/PICC-without erythema  Lab Results:  Lab Results  Component Value Date   WBC 14.0 (H) 11/08/2019   HGB 9.3 (L) 11/08/2019   HCT 28.2 (L) 11/08/2019   MCV 96.6 11/08/2019   PLT 351 11/08/2019   NEUTROABS 11.7 (H) 11/08/2019    CMP  Lab Results  Component Value Date   NA 133 (L) 11/08/2019   K 4.6 11/08/2019   CL 103 11/08/2019   CO2 15 (L) 11/08/2019   GLUCOSE 81 11/08/2019   BUN 49 (H) 11/08/2019   CREATININE 1.83 (H) 11/08/2019   CALCIUM 8.3 (L) 11/08/2019   PROT 6.5 11/08/2019   ALBUMIN 1.3 (L) 11/08/2019   AST 139 (H) 11/08/2019   ALT 87 (H) 11/08/2019   ALKPHOS 674 (H) 11/08/2019   BILITOT 14.4 (HH) 11/08/2019   GFRNONAA 40 (L) 11/08/2019   GFRAA 46 (L) 11/08/2019     Medications: I have reviewed the patient's current medications.   Assessment/Plan: 1. Pancreas  head mass  Presented with jaundice, bilirubin elevated at 21.4  CTs abdomen/pelvis 12/20/2018-severe intrahepatic and extrahepatic biliary dilatation due to a possible 3.5 x 2.5 cm solid mass in the pancreatic head. Pancreatic ductal dilatation noted as well with multiple cystic lesions in the pancreatic tail and body.   CT chest 12/20/2018-occasional nonspecific small pulmonary nodules felt to likely be related to prior infection or inflammation.   ERCP by Dr. Watt Climes 12/21/2018. The major papilla was on the rim of a diverticulum. The minor papilla appeared to be bulging. A biopsy of the duodenum adjacent to the minor papilla was performed. Plastic stent was placed into the ventral pancreatic duct. Biliary sphincterotomy performed. Covered metal stent placed into the common bile duct. Bile duct brushings showed benign reactive/reparative changes. Biopsy of polypoid duodenal mucosa suggestive of nodular peptic duodenitis, negative for dysplasia or malignancy.   Upper EUS by Dr. Paulita Fujita on 01/03/2019 showed a few cystic lesions in the pancreatic body and pancreatic tail. A stent was visualized in the common bile duct. Mass identified in the pancreatic head with fine-needle aspiration performed. Lesion appeared to abut or potentially superficially invade the superior mesenteric vein. No lymphadenopathy. No involvement of the superior mesenteric artery or celiac artery. Pathology returned suspicious for malignancy. Dr. Paulita Fujita notes if pathology shows adenocarcinoma the lesion would be staged T3 N0 MX.  Repeat ERCP and placement of a metal bile duct stent  01/24/2019  EUS 01/24/2019, T3N0 pancreas head mass with abutment of the SMV, FNA of the pancreas mass revealed adenocarcinoma, cytology from the bile duct stent revealed adenocarcinoma  Cycle 1 FOLFIRINOX4/04/2019  Cycle 2 FOLFIRINOX4/20/2020, Udenyca added  Cycle 3 FOLFIRINOX 03/09/2019, Irinotecan and 5-fluorouracil dose reduced, Udenyca  held  Cycle 4 FOLFIRINOX 03/21/2019-Udenyca held  Cycle 5 FOLFIRINOX6/01/2019  Cycle 6 FOLFIRINOX 04/24/2019 (oxaliplatin dose reduced due to poor tolerance of chemotherapy)  CT 05/11/2019-decreased size of pancreas head mass, unchanged peripancreatic lymph node, no evidence of metastatic disease, improved/resolved fluid collections at the pancreas tail  Cycle 7 FOLFIRINOX 05/22/2019  Cycle 8 FOLFIRINOX 06/06/2019  07/20/2019 pancreaticoduodenectomy 07/20/2019-pT2, pN0 (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 Treatment effect absent, extensive residual cancer with no evident tumor regression)  MRI abdomen 09/05/2019-reaccumulation of fluid along the perihepatic margin following presumed removal of the pigtail drainage catheter that was present in this area on the study of 08/24/2019; some fluid in the region of the porta hepatis and pancreaticobiliary limb; small collection of fluid just inferior to the interlobar fissure in the liver along the course of the previous drain; tract extending from this area to the midline abdominal wound; innumerable areas of abnormality seen on T2 and diffusion throughout the liver, largest in the range of 1 cm. All areas of the liver affected, both right and left hepatic lobes. Along the gallbladder fossa is a peripherally enhancing 1.8 x 1.3 cm focus.  Ultrasound-guided biopsy of a liver lesion 09/26/2019-adenocarcinoma compatible with pancreatic adenocarcinoma, MSS, tumor mutation burden 3,ERBB2amplification, KRAS G12D  Cycle 1 gemcitabine 10/23/2019  Paracentesis 10/25/2019-malignant cells consistent with metastatic adenocarcinoma.  Cycle 2 gemcitabine 11/08/2019 2. Biliary obstruction status post stent placement 3. History  of left lower extremity DVT 2017, 2018-on Eliquis. 4. Type 2 diabetes 5. Hypertension 6. Chronic kidney disease 7. Tobacco use 8. Anemia, macrocytic. Transfused 2 units of blood 12/20/2018.On oral iron. Improved 02/01/2019 and 02/06/2019. 9. Port-A-Cath placement 02/02/2019, Dr. Barry Dienes 10. 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 Hospitalized 08/07/2019 through 08/11/2019 with postsurgical intra-abdominal fluid collection. CT 08/07/2019 with 2 rim-enhancing fluid collections in the right upper quadrant concerning for abscesses, moderate wall thickening and pericolonic inflammatory changes involving the distal ascending, transverse and descending colon consistent with colitis. Blood culture 08/07/2019 + for Prevotella buccae beta-lactamase positive; status post CT-guided aspiration of intra-abdominal fluid collection and drain placement on 08/09/2019, culture with multiple organisms present, none predominant; follow-up CT 08/24/2019 with resolution of dominant fluid collection within the right upper abdominal quadrant, tiny serpiginous fluid collection within the ventral aspect of the right upper abdomen, interval development of multiple ill-defined hypoattenuating liver lesions; MRI abdomen 09/05/2019 reaccumulation of fluid along the perihepatic margin following presumed removal of the pigtail drainage catheter that was present in this area on the study of 08/24/2019; some fluid in the region of the porta hepatis and pancreaticobiliary limb; small collection of fluid just inferior to the interlobar fissure in the liver along the course of the previous drain; tract extending from this area to the midline abdominal wound; innumerable areas of abnormality seen on T2 and diffusion throughout the liver, largest in the range of 1 cm. All areas of the liver affected, both right and left hepatic lobes. Along the gallbladder fossa is a  peripherally enhancing 1.8  x 1.3 cm focus. [facility] admission 10/11/2019-sepsis, blood cultures positive for a Streptococcus species, likely source is right upper quadrant abscess 14. Hyperbilirubinemia-status post placement of a right biliary system drain 10/13/2019.  Cholangiogram through existing catheter 10/25/2019-stable position of the internal/external biliary drain.  Cholangiogram is similar to the initial placement images.  Minimal filling of the intrahepatic bile duct suggestive for multifocal disease within the liver.   Disposition: Brian Torres has metastatic pancreas cancer.  He has a poor performance status.  We discussed treatment options.  We discussed comfort care.  He understands the small chance of clinical improvement with further gemcitabine chemotherapy.  He wishes to continue chemotherapy.  He will complete another cycle of chemotherapy today.  He will return for an office visit with the plan to complete cycle 3 gemcitabine next week.  Brian Torres is scheduled for a therapeutic paracentesis today.  I recommended he see Dr. Leonides Schanz this week to discuss management of diabetes.  He will likely require less insulin and may be able to discontinue some of the oral agents as his nutrition is poor.  We reviewed a list of questions his wife had composed.  30 minutes were spent with the patient today.  The majority of the time was used for counseling and coordination of care.  Betsy Coder, MD  11/08/2019  12:33 PM

## 2019-11-08 NOTE — Progress Notes (Signed)
CRITICAL VALUE STICKER  CRITICAL VALUE: bili 14.4  RECEIVER (on-site recipient of call): Manuela Schwartz, [place] NOTIFIED: 11/08/19 @ 1120  MESSENGER (representative from lab): Clarise Cruz, RN  MD NOTIFIED: Dr. Benay Spice   TIME OF NOTIFICATION: 1122  RESPONSE: No new orders-

## 2019-11-08 NOTE — Procedures (Signed)
Ultrasound-guided  therapeutic paracentesis performed yielding 2.8 liters of golden yellow fluid. No immediate complications. EBL< 1cc.

## 2019-11-08 NOTE — Progress Notes (Signed)
Dr. Benay Spice has reviewed CBC and CMP: OK to treat despite abnormal liver functions. Will receive single agent Gemzar only today.

## 2019-11-08 NOTE — Patient Instructions (Signed)
[facility] Discharge Instructions for Patients Receiving Chemotherapy  Today you received the following chemotherapy agents: Gemcitabine.  To help prevent nausea and vomiting after your treatment, we encourage you to take your nausea medication as directed   If you develop nausea and vomiting that is not controlled by your nausea medication, call the clinic.   BELOW ARE SYMPTOMS THAT SHOULD BE REPORTED IMMEDIATELY:  *FEVER GREATER THAN 100.5 F  *CHILLS WITH OR WITHOUT FEVER  NAUSEA AND VOMITING THAT IS NOT CONTROLLED WITH YOUR NAUSEA MEDICATION  *UNUSUAL SHORTNESS OF BREATH  *UNUSUAL BRUISING OR BLEEDING  TENDERNESS IN MOUTH AND THROAT WITH OR WITHOUT PRESENCE OF ULCERS  *URINARY PROBLEMS  *BOWEL PROBLEMS  UNUSUAL RASH Items with * indicate a potential emergency and should be followed up as soon as possible.  Feel free to call the clinic should you have any questions or concerns. The clinic phone number is [phone].  Please show the [facility] at check-in to the Emergency Department and triage nurse.  G

## 2019-11-09 ENCOUNTER — Ambulatory Visit: Payer: Managed Care, Other (non HMO)

## 2019-11-13 ENCOUNTER — Telehealth: Payer: Self-pay | Admitting: *Deleted

## 2019-11-13 ENCOUNTER — Telehealth: Payer: Self-pay | Admitting: Oncology

## 2019-11-13 NOTE — Telephone Encounter (Signed)
Asking if his chemo and OV be moved to 1/13, same day as paracentesis (they had nothing on 1/12). Informed wife that per MD schedule and chemo, there is nothing available to accommodate her request. Will be glad to call us after chemo is completed to see if they had a cancellation.

## 2019-11-13 NOTE — Telephone Encounter (Signed)
Called and left msg about 1/12

## 2019-11-14 ENCOUNTER — Inpatient Hospital Stay: Payer: Managed Care, Other (non HMO)

## 2019-11-14 ENCOUNTER — Other Ambulatory Visit: Payer: Self-pay

## 2019-11-14 ENCOUNTER — Telehealth: Payer: Self-pay | Admitting: Oncology

## 2019-11-14 ENCOUNTER — Telehealth: Payer: Self-pay | Admitting: *Deleted

## 2019-11-14 ENCOUNTER — Inpatient Hospital Stay (HOSPITAL_BASED_OUTPATIENT_CLINIC_OR_DEPARTMENT_OTHER): Payer: Managed Care, Other (non HMO) | Admitting: Oncology

## 2019-11-14 ENCOUNTER — Other Ambulatory Visit: Payer: Self-pay | Admitting: *Deleted

## 2019-11-14 VITALS — BP 91/63 | HR 101 | Temp 98.3°F | Resp 18

## 2019-11-14 DIAGNOSIS — C250 Malignant neoplasm of head of pancreas: Secondary | ICD-10-CM

## 2019-11-14 DIAGNOSIS — Z95828 Presence of other vascular implants and grafts: Secondary | ICD-10-CM

## 2019-11-14 DIAGNOSIS — Z5111 Encounter for antineoplastic chemotherapy: Secondary | ICD-10-CM | POA: Diagnosis not present

## 2019-11-14 LAB — CMP (CANCER CENTER ONLY)
ALT: 104 U/L — ABNORMAL HIGH (ref 0–44)
AST: 147 U/L — ABNORMAL HIGH (ref 15–41)
Albumin: 1.2 g/dL — ABNORMAL LOW (ref 3.5–5.0)
Alkaline Phosphatase: 509 U/L — ABNORMAL HIGH (ref 38–126)
Anion gap: 18 — ABNORMAL HIGH (ref 5–15)
BUN: 78 mg/dL — ABNORMAL HIGH (ref 6–20)
CO2: 12 mmol/L — ABNORMAL LOW (ref 22–32)
Calcium: 7.8 mg/dL — ABNORMAL LOW (ref 8.9–10.3)
Chloride: 101 mmol/L (ref 98–111)
Creatinine: 2.92 mg/dL — ABNORMAL HIGH (ref 0.61–1.24)
GFR, Est AFR Am: 26 mL/min — ABNORMAL LOW (ref >60)
GFR, Estimated: 22 mL/min — ABNORMAL LOW (ref >60)
Glucose, Bld: 72 mg/dL (ref 70–99)
Potassium: 5.2 mmol/L — ABNORMAL HIGH (ref 3.5–5.1)
Sodium: 131 mmol/L — ABNORMAL LOW (ref 135–145)
Total Bilirubin: 16.7 mg/dL (ref 0.3–1.2)
Total Protein: 6.1 g/dL — ABNORMAL LOW (ref 6.5–8.1)

## 2019-11-14 LAB — CBC WITH DIFFERENTIAL (CANCER CENTER ONLY)
Abs Immature Granulocytes: 0.02 10*3/uL (ref 0.00–0.07)
Basophils Absolute: 0 10*3/uL (ref 0.0–0.1)
Basophils Relative: 1 %
Eosinophils Absolute: 0 10*3/uL (ref 0.0–0.5)
Eosinophils Relative: 2 %
HCT: 22.9 % — ABNORMAL LOW (ref 39.0–52.0)
Hemoglobin: 7.7 g/dL — ABNORMAL LOW (ref 13.0–17.0)
Immature Granulocytes: 1 %
Lymphocytes Relative: 15 %
Lymphs Abs: 0.3 10*3/uL — ABNORMAL LOW (ref 0.7–4.0)
MCH: 32.6 pg (ref 26.0–34.0)
MCHC: 33.6 g/dL (ref 30.0–36.0)
MCV: 97 fL (ref 80.0–100.0)
Monocytes Absolute: 0 10*3/uL — ABNORMAL LOW (ref 0.1–1.0)
Monocytes Relative: 2 %
Neutro Abs: 1.8 10*3/uL (ref 1.7–7.7)
Neutrophils Relative %: 79 %
Platelet Count: 132 10*3/uL — ABNORMAL LOW (ref 150–400)
RBC: 2.36 MIL/uL — ABNORMAL LOW (ref 4.22–5.81)
RDW: 25.6 % — ABNORMAL HIGH (ref 11.5–15.5)
WBC Count: 2.2 10*3/uL — ABNORMAL LOW (ref 4.0–10.5)
nRBC: 0 % (ref 0.0–0.2)

## 2019-11-14 MED ORDER — SODIUM CHLORIDE 0.9% FLUSH
10.0000 mL | INTRAVENOUS | Status: DC | PRN
  Administered 2019-11-14: 10 mL
  Filled 2019-11-14: qty 10

## 2019-11-14 NOTE — Telephone Encounter (Signed)
FAXED RECORDS TO [facility] RELEASE ID [id number]

## 2019-11-14 NOTE — Telephone Encounter (Signed)
Contacted by lab/Marsha @ 1404 - Bilirubin 16.7. Dr. Benay Spice notified [phone]

## 2019-11-14 NOTE — Progress Notes (Signed)
[facility] OFFICE PROGRESS NOTE   Diagnosis: Pancreas cancer  INTERVAL HISTORY:   Mr. Seldon completed a second treatment with gemcitabine on 11/08/2019.  He reports nausea following chemotherapy.  He had an episode of "black "emesis.  He has intermittent black stool.  Mr. Salceda reports a good appetite.  He says he has had a fever, but he cannot recall a specific temperature.  No cough.  The abdomen remains distended. He saw Dr. Leonides Schanz for diabetes management and reports the diabetes regimen was adjusted. Objective:  Vital signs in last 24 hours:  Blood pressure 91/63, pulse (!) 101, temperature 98.3 F (36.8 C), temperature source Temporal, resp. rate 18, SpO2 100 %.     Resp: Lungs clear bilaterally Cardio: Regular rate and rhythm GI: Distended with ascites, right upper quadrant biliary drain, midline wound has healed except for a small superficial opening at the lower aspect of the wound Vascular: Pitting edema at the lower leg bilaterally Neuro: Lethargic, arousable, follows commands, ambulated to the examination table Skin: Jaundice  Portacath/PICC-without erythema  Lab Results:  Lab Results  Component Value Date   WBC 2.2 (L) 11/14/2019   HGB 7.7 (L) 11/14/2019   HCT 22.9 (L) 11/14/2019   MCV 97.0 11/14/2019   PLT 132 (L) 11/14/2019   NEUTROABS 1.8 11/14/2019    CMP  Lab Results  Component Value Date   NA 131 (L) 11/14/2019   K 5.2 (H) 11/14/2019   CL 101 11/14/2019   CO2 12 (L) 11/14/2019   GLUCOSE 72 11/14/2019   BUN 78 (H) 11/14/2019   CREATININE 2.92 (H) 11/14/2019   CALCIUM 7.8 (L) 11/14/2019   PROT 6.1 (L) 11/14/2019   ALBUMIN 1.2 (L) 11/14/2019   AST 147 (H) 11/14/2019   ALT 104 (H) 11/14/2019   ALKPHOS 509 (H) 11/14/2019   BILITOT 16.7 (HH) 11/14/2019   GFRNONAA 22 (L) 11/14/2019   GFRAA 26 (L) 11/14/2019     Medications: I have reviewed the patient's current medications.   Assessment/Plan: 1. Pancreas head  mass  Presented with jaundice, bilirubin elevated at 21.4  CTs abdomen/pelvis 12/20/2018-severe intrahepatic and extrahepatic biliary dilatation due to a possible 3.5 x 2.5 cm solid mass in the pancreatic head. Pancreatic ductal dilatation noted as well with multiple cystic lesions in the pancreatic tail and body.   CT chest 12/20/2018-occasional nonspecific small pulmonary nodules felt to likely be related to prior infection or inflammation.   ERCP by Dr. Watt Climes 12/21/2018. The major papilla was on the rim of a diverticulum. The minor papilla appeared to be bulging. A biopsy of the duodenum adjacent to the minor papilla was performed. Plastic stent was placed into the ventral pancreatic duct. Biliary sphincterotomy performed. Covered metal stent placed into the common bile duct. Bile duct brushings showed benign reactive/reparative changes. Biopsy of polypoid duodenal mucosa suggestive of nodular peptic duodenitis, negative for dysplasia or malignancy.   Upper EUS by Dr. Paulita Fujita on 01/03/2019 showed a few cystic lesions in the pancreatic body and pancreatic tail. A stent was visualized in the common bile duct. Mass identified in the pancreatic head with fine-needle aspiration performed. Lesion appeared to abut or potentially superficially invade the superior mesenteric vein. No lymphadenopathy. No involvement of the superior mesenteric artery or celiac artery. Pathology returned suspicious for malignancy. Dr. Paulita Fujita notes if pathology shows adenocarcinoma the lesion would be staged T3 N0 MX.  Repeat ERCP and placement of a metal bile duct stent 01/24/2019  EUS 01/24/2019, T3N0 pancreas head mass with  abutment of the SMV, FNA of the pancreas mass revealed adenocarcinoma, cytology from the bile duct stent revealed adenocarcinoma  Cycle 1 FOLFIRINOX4/04/2019  Cycle 2 FOLFIRINOX4/20/2020, Udenyca added  Cycle 3 FOLFIRINOX 03/09/2019, Irinotecan and 5-fluorouracil dose reduced, Udenyca  held  Cycle 4 FOLFIRINOX 03/21/2019-Udenyca held  Cycle 5 FOLFIRINOX6/01/2019  Cycle 6 FOLFIRINOX 04/24/2019 (oxaliplatin dose reduced due to poor tolerance of chemotherapy)  CT 05/11/2019-decreased size of pancreas head mass, unchanged peripancreatic lymph node, no evidence of metastatic disease, improved/resolved fluid collections at the pancreas tail  Cycle 7 FOLFIRINOX 05/22/2019  Cycle 8 FOLFIRINOX 06/06/2019  07/20/2019 pancreaticoduodenectomy 07/20/2019-pT2, pN0 (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 Treatment effect absent, extensive residual cancer with no evident tumor regression)  MRI abdomen 09/05/2019-reaccumulation of fluid along the perihepatic margin following presumed removal of the pigtail drainage catheter that was present in this area on the study of 08/24/2019; some fluid in the region of the porta hepatis and pancreaticobiliary limb; small collection of fluid just inferior to the interlobar fissure in the liver along the course of the previous drain; tract extending from this area to the midline abdominal wound; innumerable areas of abnormality seen on T2 and diffusion throughout the liver, largest in the range of 1 cm. All areas of the liver affected, both right and left hepatic lobes. Along the gallbladder fossa is a peripherally enhancing 1.8 x 1.3 cm focus.  Ultrasound-guided biopsy of a liver lesion 09/26/2019-adenocarcinoma compatible with pancreatic adenocarcinoma, MSS, tumor mutation burden 3,ERBB2amplification, KRAS G12D  Cycle 1 gemcitabine 10/23/2019  Paracentesis 10/25/2019-malignant cells consistent with metastatic adenocarcinoma.  Cycle 2 gemcitabine 11/08/2019 2. Biliary obstruction status post stent placement 3. History  of left lower extremity DVT 2017, 2018-on Eliquis. 4. Type 2 diabetes 5. Hypertension 6. Chronic kidney disease 7. Tobacco use 8. Anemia, macrocytic. Transfused 2 units of blood 12/20/2018.On oral iron. Improved 02/01/2019 and 02/06/2019.  Progressive anemia 11/14/2019 9. Port-A-Cath placement 02/02/2019, Dr. Barry Dienes 10. 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 Hospitalized 08/07/2019 through 08/11/2019 with postsurgical intra-abdominal fluid collection. CT 08/07/2019 with 2 rim-enhancing fluid collections in the right upper quadrant concerning for abscesses, moderate wall thickening and pericolonic inflammatory changes involving the distal ascending, transverse and descending colon consistent with colitis. Blood culture 08/07/2019 + for Prevotella buccae beta-lactamase positive; status post CT-guided aspiration of intra-abdominal fluid collection and drain placement on 08/09/2019, culture with multiple organisms present, none predominant; follow-up CT 08/24/2019 with resolution of dominant fluid collection within the right upper abdominal quadrant, tiny serpiginous fluid collection within the ventral aspect of the right upper abdomen, interval development of multiple ill-defined hypoattenuating liver lesions; MRI abdomen 09/05/2019 reaccumulation of fluid along the perihepatic margin following presumed removal of the pigtail drainage catheter that was present in this area on the study of 08/24/2019; some fluid in the region of the porta hepatis and pancreaticobiliary limb; small collection of fluid just inferior to the interlobar fissure in the liver along the course of the previous drain; tract extending from this area to the midline abdominal wound; innumerable areas of abnormality seen on T2 and diffusion throughout the liver, largest in the range of 1 cm. All areas of the liver affected, both right and left hepatic lobes. Along  the gallbladder fossa is a peripherally enhancing 1.8 x 1.3 cm focus. 13.  Hospital admission 10/11/2019-sepsis, blood cultures positive for a Streptococcus species, likely source is right upper quadrant abscess 14. Hyperbilirubinemia-status post placement of a right biliary system drain 10/13/2019.  Cholangiogram through existing catheter 10/25/2019-stable position of the internal/external biliary drain.  Cholangiogram is similar to the initial placement images.  Minimal filling of the intrahepatic bile duct suggestive for multifocal disease within the liver.    Disposition: Mr. Zeiss has metastatic pancreas cancer.  He has completed 2 treatments with gemcitabine.  His performance status is poor and has declined since beginning chemotherapy.  He appears lethargic today.  Chemotherapy was held today secondary to his poor performance status and the drop in his white count and platelets.  The hemoglobin is lower.  This may indicate bleeding.  He will discontinue Eliquis.  He is at increased risk of developing recurrent thrombosis.  I received the chemistry panel after he left the office today.  The bilirubin remains high.  The BUN and creatinine are high, likely secondary to dehydration, though he could have obstructive nephropathy.  I contacted his wife by telephone and discussed the poor prognosis.  I estimate his life span may be measured in  weeks.  I recommend hospice care.  She and Mr. Tierce will consider this over the next few days.  He will be scheduled for an office visit next week.  He will increase fluids as tolerated and we will repeat a chemistry panel when he is here for a therapeutic paracentesis tomorrow  Betsy Coder, MD  11/14/2019  2:15 PM

## 2019-11-14 NOTE — Telephone Encounter (Signed)
Scheduled appt per 11/2 sch message - unable to reach pt . Left message with appt date and time   

## 2019-11-15 ENCOUNTER — Other Ambulatory Visit: Payer: Self-pay

## 2019-11-15 ENCOUNTER — Ambulatory Visit (HOSPITAL_COMMUNITY)
Admission: RE | Admit: 2019-11-15 | Discharge: 2019-11-15 | Disposition: A | Discharge status: Home / Self Care | Payer: Managed Care, Other (non HMO) | Source: Ambulatory Visit | Attending: Oncology | Admitting: Oncology

## 2019-11-15 ENCOUNTER — Other Ambulatory Visit: Payer: Self-pay | Admitting: *Deleted

## 2019-11-15 ENCOUNTER — Inpatient Hospital Stay: Payer: Managed Care, Other (non HMO)

## 2019-11-15 DIAGNOSIS — C250 Malignant neoplasm of head of pancreas: Secondary | ICD-10-CM | POA: Insufficient documentation

## 2019-11-15 DIAGNOSIS — Z5111 Encounter for antineoplastic chemotherapy: Secondary | ICD-10-CM | POA: Diagnosis not present

## 2019-11-15 LAB — CBC WITH DIFFERENTIAL (CANCER CENTER ONLY)
Abs Immature Granulocytes: 0.04 10*3/uL (ref 0.00–0.07)
Basophils Absolute: 0 10*3/uL (ref 0.0–0.1)
Basophils Relative: 0 %
Eosinophils Absolute: 0 10*3/uL (ref 0.0–0.5)
Eosinophils Relative: 1 %
HCT: 21.8 % — ABNORMAL LOW (ref 39.0–52.0)
Hemoglobin: 7.4 g/dL — ABNORMAL LOW (ref 13.0–17.0)
Immature Granulocytes: 2 %
Lymphocytes Relative: 14 %
Lymphs Abs: 0.4 10*3/uL — ABNORMAL LOW (ref 0.7–4.0)
MCH: 32.6 pg (ref 26.0–34.0)
MCHC: 33.9 g/dL (ref 30.0–36.0)
MCV: 96 fL (ref 80.0–100.0)
Monocytes Absolute: 0 10*3/uL — ABNORMAL LOW (ref 0.1–1.0)
Monocytes Relative: 2 %
Neutro Abs: 2 10*3/uL (ref 1.7–7.7)
Neutrophils Relative %: 81 %
Platelet Count: 75 10*3/uL — ABNORMAL LOW (ref 150–400)
RBC: 2.27 MIL/uL — ABNORMAL LOW (ref 4.22–5.81)
RDW: 25.4 % — ABNORMAL HIGH (ref 11.5–15.5)
WBC Count: 2.5 10*3/uL — ABNORMAL LOW (ref 4.0–10.5)
nRBC: 0 % (ref 0.0–0.2)

## 2019-11-15 LAB — BASIC METABOLIC PANEL - CANCER CENTER ONLY
Anion gap: 14 (ref 5–15)
BUN: 81 mg/dL — ABNORMAL HIGH (ref 6–20)
CO2: 17 mmol/L — ABNORMAL LOW (ref 22–32)
Calcium: 7.7 mg/dL — ABNORMAL LOW (ref 8.9–10.3)
Chloride: 101 mmol/L (ref 98–111)
Creatinine: 2.99 mg/dL — ABNORMAL HIGH (ref 0.61–1.24)
GFR, Est AFR Am: 25 mL/min — ABNORMAL LOW (ref >60)
GFR, Estimated: 22 mL/min — ABNORMAL LOW (ref >60)
Glucose, Bld: 83 mg/dL (ref 70–99)
Potassium: 5 mmol/L (ref 3.5–5.1)
Sodium: 132 mmol/L — ABNORMAL LOW (ref 135–145)

## 2019-11-15 MED ORDER — LIDOCAINE HCL 1 % IJ SOLN
INTRAMUSCULAR | Status: AC
  Filled 2019-11-15: qty 20

## 2019-11-16 ENCOUNTER — Telehealth: Payer: Self-pay | Admitting: *Deleted

## 2019-11-16 NOTE — Telephone Encounter (Addendum)
Left VM requesting return call with status update and if they had made any decision as to how to manage his future care needs.  Mr. Fiola returned call and reports they are undecided about Hospice at this time, but it is still on the table. She has the following questions/requests for Dr. Benay Spice... 1. With Hgb 7.4 now, can he get a transfusion to see if this helps him feel better? 2. Is drinking over 48 ounces of fluid/day and voids frequently. Why are his kidney functions high and how can he     have blockage if he is voiding? 3. Would IV fluids at home be of any benefit or just cause his swelling to increase? 4. Is "twitching" a lot--what is causing this? Wanted MD aware that he is lethargic and getting confused as well. He fell today and it took a significant amount of time to help get him up.

## 2019-11-16 NOTE — Telephone Encounter (Signed)
Called wife back and left message that Dr. Benay Spice does not think a transfusion will help him feel better. If they insist, can try to give 1 unit. He could have hydronephrosis due to blockage of kidneys at ureters, he will still void, but kidney functions are abnormal. IV fluids at home will only cause more swelling. The twitching can be due to kidney function or the pain medications. MD feels he really needs Hospice. Feels he needs to be kept at home and comfortable where family can be with him. Concerned he will decline and go to ER or hospital and then family won't be there around him due to restrictions. Requested she call back tomorrow with how they wish to proceed.

## 2019-11-17 ENCOUNTER — Telehealth: Payer: Self-pay

## 2019-11-17 ENCOUNTER — Ambulatory Visit: Payer: Managed Care, Other (non HMO) | Admitting: Vascular Surgery

## 2019-11-17 ENCOUNTER — Telehealth: Payer: Self-pay | Admitting: Internal Medicine

## 2019-11-17 ENCOUNTER — Other Ambulatory Visit: Payer: Self-pay | Admitting: *Deleted

## 2019-11-17 ENCOUNTER — Inpatient Hospital Stay: Payer: Managed Care, Other (non HMO)

## 2019-11-17 ENCOUNTER — Other Ambulatory Visit: Payer: Self-pay

## 2019-11-17 ENCOUNTER — Encounter (HOSPITAL_COMMUNITY): Payer: Managed Care, Other (non HMO)

## 2019-11-17 ENCOUNTER — Inpatient Hospital Stay (HOSPITAL_BASED_OUTPATIENT_CLINIC_OR_DEPARTMENT_OTHER): Payer: Managed Care, Other (non HMO) | Admitting: Oncology

## 2019-11-17 VITALS — BP 106/58 | HR 97 | Temp 98.4°F | Resp 24

## 2019-11-17 DIAGNOSIS — C250 Malignant neoplasm of head of pancreas: Secondary | ICD-10-CM | POA: Diagnosis not present

## 2019-11-17 DIAGNOSIS — Z5111 Encounter for antineoplastic chemotherapy: Secondary | ICD-10-CM | POA: Diagnosis not present

## 2019-11-17 LAB — GLUCOSE, CAPILLARY: Glucose-Capillary: 105 mg/dL — ABNORMAL HIGH (ref 70–99)

## 2019-11-17 NOTE — Telephone Encounter (Signed)
Phone call placed to patient to offer to schedule a visit with [facility]. Phone rang, with no answer I left a voicemail for call back. 

## 2019-11-17 NOTE — Telephone Encounter (Signed)
Received return call from patient's wife, Cyndi, to schedule visit with Palliative Care. Offered to schedule for Tuesday 11/21/19. Cyndi unavailable @ that time due to previously scheduled appointment. Cyndi requesting visit before next available (11/24/19)  Will contact NP and return call to patient/wife.

## 2019-11-17 NOTE — Progress Notes (Signed)
Wife called back and wants to pursue 1 unit blood today to see if it helps. Also asking for paracentesis on 1/19 when here for chemo.

## 2019-11-17 NOTE — Progress Notes (Signed)
@   12:15 pm family members dropped patient off for appointment for transfusion. He was breathing, but not responsive. Brought to exam room and v/s taken. Blood glucose checked and was 105. At Dr. Gearldine Shown request called wife and requested she come to office now. Mr. Rumple does not look good. Asked if she wanted him to be resuscitated if he stops breathing. She said no.  @ 1227 started IV NS at 50 cc/hr as we await his wife to come to cancer center.  @ 1300 MD spoke with wife and she agrees in presence of patient, who agreed to be DNR and return home with Hospice care. Called [facility] and Delsa Sale will contact nurse Eddie Dibbles who is available at [facility] today. Will see if he can get there earlier. Will transport home via [facility]. MD signed out of facility DNR order for patient to have in home. @ 1400: Call from [facility] w/Hospice and nurse Margreta Journey can be at home at 4 pm today. [facility] reports they could have him there by 3:30 pm today.  @ 1415: VS stable. Wife requested he be reclined, so he was lifted to exam table to rest until d/c home. Patient is no longer responding to nurse's questions. @ 1500: Transported home via [facility]. Wife is following in car.

## 2019-11-17 NOTE — Telephone Encounter (Signed)
Spoke with Enid Derry NP regarding scheduling visit for today. Reviewed documentation and noted patient will transition to hospice care. Will cancel Palliative care referral

## 2019-11-17 NOTE — Progress Notes (Signed)
Brian Torres   Diagnosis: Pancreas cancer  INTERVAL HISTORY:   Brian Torres return to the cancer center today for a scheduled red cell transfusion.  He appeared minimally responsive upon arrival to the cancer center.  He was placed in an examination room for further evaluation.  Objective:  Vital signs in last 24 hours:  Blood pressure (!) 106/58, pulse 97, temperature 98.4 F (36.9 C), temperature source Temporal, resp. rate (!) 24, SpO2 100 %.    HEENT: Scleral icterus Resp: Distant breath sounds, no respiratory distress Cardio: Regular rate and rhythm GI: Distended, right upper quadrant biliary drain Vascular: Pitting edema in the leg bilaterally Neuro: Lethargic, moans with sternal rub, nods Brian head yes and no, not speaking in words or sentences Skin: Jaundice Portacath/PICC-without erythema  Lab Results:  Lab Results  Component Value Date   WBC 2.5 (L) 11/15/2019   HGB 7.4 (L) 11/15/2019   HCT 21.8 (L) 11/15/2019   MCV 96.0 11/15/2019   PLT 75 (L) 11/15/2019   NEUTROABS 2.0 11/15/2019    CMP  Lab Results  Component Value Date   NA 132 (L) 11/15/2019   K 5.0 11/15/2019   CL 101 11/15/2019   CO2 17 (L) 11/15/2019   GLUCOSE 83 11/15/2019   BUN 81 (H) 11/15/2019   CREATININE 2.99 (H) 11/15/2019   CALCIUM 7.7 (L) 11/15/2019   PROT 6.1 (L) 11/14/2019   ALBUMIN 1.2 (L) 11/14/2019   AST 147 (H) 11/14/2019   ALT 104 (H) 11/14/2019   ALKPHOS 509 (H) 11/14/2019   BILITOT 16.7 (HH) 11/14/2019   GFRNONAA 22 (L) 11/15/2019   GFRAA 25 (L) 11/15/2019     Imaging:  US Paracentesis  Result Date: 11/15/2019 INDICATION: History of metastatic pancreatic cancer. Recurrent ascites. Request for therapeutic paracentesis. EXAM: ULTRASOUND GUIDED LEFT LOWER QUADRANT PARACENTESIS MEDICATIONS: None. COMPLICATIONS: None immediate. PROCEDURE: Informed written consent was obtained from the patient after a discussion of the risks, benefits and  alternatives to treatment. A timeout was performed prior to the initiation of the procedure. Initial ultrasound scanning demonstrates a large amount of ascites within the left lower abdominal quadrant. The left lower abdomen was prepped and draped in the usual sterile fashion. 1% lidocaine was used for local anesthesia. Following this, a 19 gauge, 7-cm, Yueh catheter was introduced. An ultrasound image was saved for documentation purposes. The paracentesis was performed. The catheter was removed and a dressing was applied. The patient tolerated the procedure well without immediate post procedural complication. FINDINGS: A total of approximately 4 L of clear yellow fluid was removed. IMPRESSION: Successful ultrasound-guided paracentesis yielding 4 liters of peritoneal fluid. Read by: Ascencion Dike PA-C Electronically Signed   By: Jacqulynn Cadet M.D.   On: 11/15/2019 15:16    Medications: I have reviewed the patient's current medications.   Assessment/Plan: 1. Pancreas head mass  Presented with jaundice, bilirubin elevated at 21.4  CTs abdomen/pelvis 12/20/2018-severe intrahepatic and extrahepatic biliary dilatation due to a possible 3.5 x 2.5 cm solid mass in the pancreatic head. Pancreatic ductal dilatation noted as well with multiple cystic lesions in the pancreatic tail and body.   CT chest 12/20/2018-occasional nonspecific small pulmonary nodules felt to likely be related to prior infection or inflammation.   ERCP by Dr. Watt Climes 12/21/2018. The major papilla was on the rim of a diverticulum. The minor papilla appeared to be bulging. A biopsy of the duodenum adjacent to the minor papilla was performed. Plastic stent was placed into the ventral  pancreatic duct. Biliary sphincterotomy performed. Covered metal stent placed into the common bile duct. Bile duct brushings showed benign reactive/reparative changes. Biopsy of polypoid duodenal mucosa suggestive of nodular peptic duodenitis,  negative for dysplasia or malignancy.   Upper EUS by Dr. Paulita Fujita on 01/03/2019 showed a few cystic lesions in the pancreatic body and pancreatic tail. A stent was visualized in the common bile duct. Mass identified in the pancreatic head with fine-needle aspiration performed. Lesion appeared to abut or potentially superficially invade the superior mesenteric vein. No lymphadenopathy. No involvement of the superior mesenteric artery or celiac artery. Pathology returned suspicious for malignancy. Dr. Paulita Fujita notes if pathology shows adenocarcinoma the lesion would be staged T3 N0 MX.  Repeat ERCP and placement of a metal bile duct stent 01/24/2019  EUS 01/24/2019, T3N0 pancreas head mass with abutment of the SMV, FNA of the pancreas mass revealed adenocarcinoma, cytology from the bile duct stent revealed adenocarcinoma  Cycle 1 FOLFIRINOX4/04/2019  Cycle 2 FOLFIRINOX4/20/2020, Udenyca added  Cycle 3 FOLFIRINOX 03/09/2019, Irinotecan and 5-fluorouracil dose reduced, Udenyca held  Cycle 4 FOLFIRINOX 03/21/2019-Udenyca held  Cycle 5 FOLFIRINOX6/01/2019  Cycle 6 FOLFIRINOX 04/24/2019 (oxaliplatin dose reduced due to poor tolerance of chemotherapy)  CT 05/11/2019-decreased size of pancreas head mass, unchanged peripancreatic lymph node, no evidence of metastatic disease, improved/resolved fluid collections at the pancreas tail  Cycle 7 FOLFIRINOX 05/22/2019  Cycle 8 FOLFIRINOX 06/06/2019  07/20/2019 pancreaticoduodenectomy 07/20/2019-pT2, pN0 (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 Treatment effect absent, extensive residual cancer with no evident tumor regression)  MRI abdomen 09/05/2019-reaccumulation of fluid along the perihepatic margin following presumed  removal of the pigtail drainage catheter that was present in this area on the study of 08/24/2019; some fluid in the region of the porta hepatis and pancreaticobiliary limb; small collection of fluid just inferior to the interlobar fissure in the liver along the course of the previous drain; tract extending from this area to the midline abdominal wound; innumerable areas of abnormality seen on T2 and diffusion throughout the liver, largest in the range of 1 cm. All areas of the liver affected, both right and left hepatic lobes. Along the gallbladder fossa is a peripherally enhancing 1.8 x 1.3 cm focus.  Ultrasound-guided biopsy of a liver lesion 09/26/2019-adenocarcinoma compatible with pancreatic adenocarcinoma, MSS, tumor mutation burden 3,ERBB2amplification, KRAS G12D  Cycle 1 gemcitabine 10/23/2019  Paracentesis 10/25/2019-malignant cells consistent with metastatic adenocarcinoma.  Cycle 2 gemcitabine 11/08/2019 2. Biliary obstruction status post stent placement 3. History of left lower extremity DVT 2017, 2018-on Eliquis. 4. Type 2 diabetes 5. Hypertension 6. Chronic kidney disease 7. Tobacco use 8. Anemia, macrocytic. Transfused 2 units of blood 12/20/2018.On oral iron. Improved 02/01/2019 and 02/06/2019.  Progressive anemia 11/14/2019 9. Port-A-Cath placement 02/02/2019, Dr. Barry Dienes 10. 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 Hospitalized 08/07/2019 through 08/11/2019 with postsurgical intra-abdominal fluid collection. CT 08/07/2019 with 2 rim-enhancing fluid collections in the right upper quadrant concerning for abscesses, moderate wall thickening and pericolonic inflammatory changes involving the distal ascending, transverse and descending colon consistent with colitis. Blood culture 08/07/2019 + for Prevotella buccae beta-lactamase positive; status post CT-guided aspiration of intra-abdominal fluid collection  and drain placement on 08/09/2019, culture with multiple organisms present, none predominant; follow-up CT 08/24/2019 with resolution of dominant fluid collection within the right  upper abdominal quadrant, tiny serpiginous fluid collection within the ventral aspect of the right upper abdomen, interval development of multiple ill-defined hypoattenuating liver lesions; MRI abdomen 09/05/2019 reaccumulation of fluid along the perihepatic margin following presumed removal of the pigtail drainage catheter that was present in this area on the study of 08/24/2019; some fluid in the region of the porta hepatis and pancreaticobiliary limb; small collection of fluid just inferior to the interlobar fissure in the liver along the course of the previous drain; tract extending from this area to the midline abdominal wound; innumerable areas of abnormality seen on T2 and diffusion throughout the liver, largest in the range of 1 cm. All areas of the liver affected, both right and left hepatic lobes. Along the gallbladder fossa is a peripherally enhancing 1.8 x 1.3 cm focus. [facility] admission 10/11/2019-sepsis, blood cultures positive for a Streptococcus species, likely source is right upper quadrant abscess 14. Hyperbilirubinemia-status post placement of a right biliary system drain 10/13/2019.  Cholangiogram through existing catheter 10/25/2019-stable position of the internal/external biliary drain.  Cholangiogram is similar to the initial placement images.  Minimal filling of the intrahepatic bile duct suggestive for multifocal disease within the liver.     Disposition: Brian Torres has metastatic pancreas cancer.  Brian performance status has declined over the past several weeks.  He has severe anemia, renal failure, and persistent hyperbilirubinemia.  I have had several discussions with Brian Torres and Brian Torres regarding the poor prognosis and our recommendation for hospice care.  He has declined significantly compared  to when we saw him on 11/14/2019.  He is minimally responsive.  Brian Torres arrived at the cancer center shortly after Brian Torres was placed in examination room.  I discussed the poor prognosis with her.  I explained the futility of additional diagnostic or therapeutic measures.  I recommend comfort care.  She agrees.  We discussed CPR and ACLS.  He will be placed on a no CODE BLUE status.  She understands Brian lifespan will likely be measured in hours to a few days.  We discussed hospital admission for terminal care versus home hospice care.  She contacted family members and requested he be transferred home with hospice.  Arrangements were made for ambulance transport to home and in-home hospice care beginning today.  We will be available to help in Brian care as needed.  Betsy Coder, MD  11/17/2019  3:51 PM

## 2019-11-20 ENCOUNTER — Ambulatory Visit: Payer: Managed Care, Other (non HMO) | Admitting: Oncology

## 2019-11-21 ENCOUNTER — Other Ambulatory Visit: Payer: Managed Care, Other (non HMO)

## 2019-11-21 ENCOUNTER — Ambulatory Visit: Payer: Managed Care, Other (non HMO) | Admitting: Nurse Practitioner

## 2019-11-21 ENCOUNTER — Ambulatory Visit: Payer: Managed Care, Other (non HMO)

## 2019-11-22 ENCOUNTER — Telehealth: Payer: Self-pay | Admitting: *Deleted

## 2019-11-22 ENCOUNTER — Other Ambulatory Visit: Payer: Self-pay | Admitting: *Deleted

## 2019-11-23 ENCOUNTER — Encounter: Payer: Self-pay | Admitting: *Deleted

## 2019-11-23 LAB — CANCER ANTIGEN 19-9

## 2019-11-23 NOTE — Progress Notes (Signed)
Fax from [facility] that patient died last night at 7:20 pm. Dr. Benay Spice notified.

## 2019-12-04 NOTE — Progress Notes (Signed)
Hospice nurse, Beth reports he is actively dying now. Moaning and seems to be in pain. Dr. Lyman Speller increased MS to 10 mg every 3 hours and Ativan 0.5 mg from every 8 hours to every 4 hours as well. Asking if Dr. Benay Spice wants to make any changes in this? Per Dr. Theone Murdoch ativan every 4 hours and may give 10 mg morphine every 2 hours.

## 2019-12-04 NOTE — Telephone Encounter (Signed)
Hospice RN, Brian Torres had called wife X 2 yesterday w/no return call and chaplain had reached out as well. Wife has just called her to request they come-he seems to be having breathing difficulty now. Nurse is en route now. Wife reports he is unresponsive and she does not leave his side and not getting much rest. She denies needing anything from Dr. Benay Spice at this time.

## 2019-12-04 DEATH — deceased

## 2020-03-30 IMAGING — CT CT CHEST W/ CM
2 of 4 series · 15 of 36 positions shown, 18 images · IV contrast (omnipaque)
Comparison: None.

CLINICAL DATA: Staging possible pancreatic cancer

EXAM:
CT CHEST WITH CONTRAST
TECHNIQUE: Multidetector CT imaging of the chest was performed during
intravenous contrast administration.
CONTRAST:  75mL OMNIPAQUE IOHEXOL 300 MG/ML  SOLN

[Series 2: axial st · axial · 0.83mm/px · z∈[+1369,+1637]mm · 12 of 158 slices shown, 15 images]
[im 12/158  mediastinal]
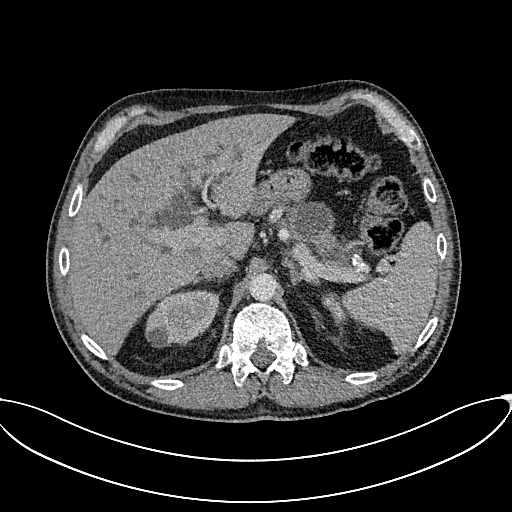
[im 12/158  lung]
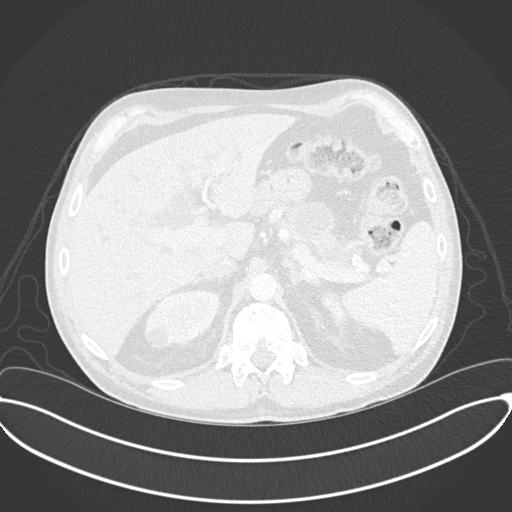
[im 23/158  lung]
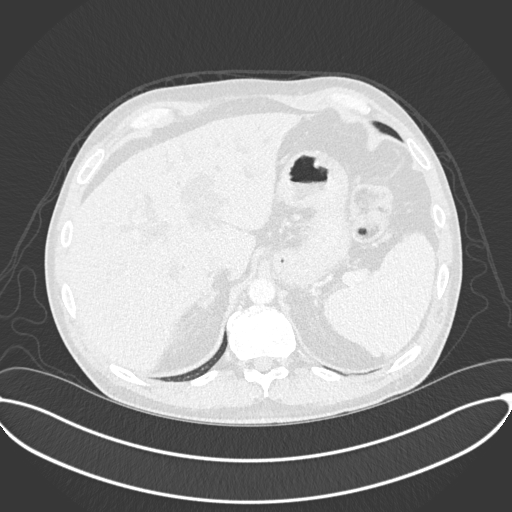
[im 34/158  lung]
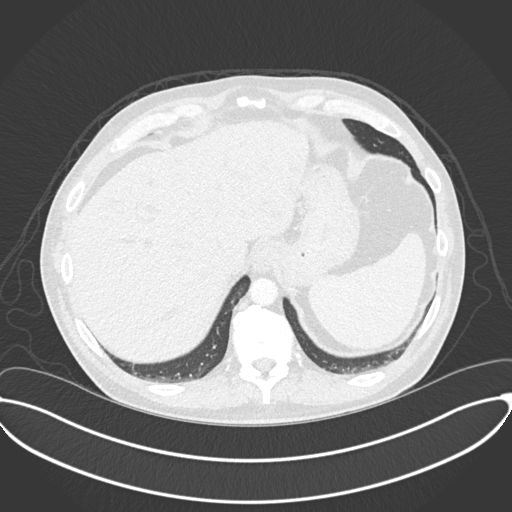
[im 45/158  lung]
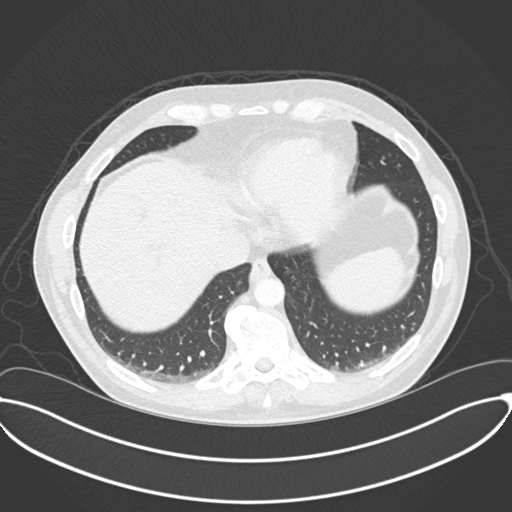
[im 57/158  mediastinal]
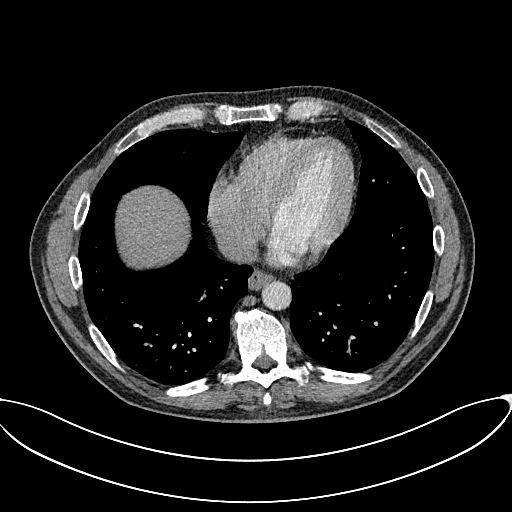
[im 57/158  lung]
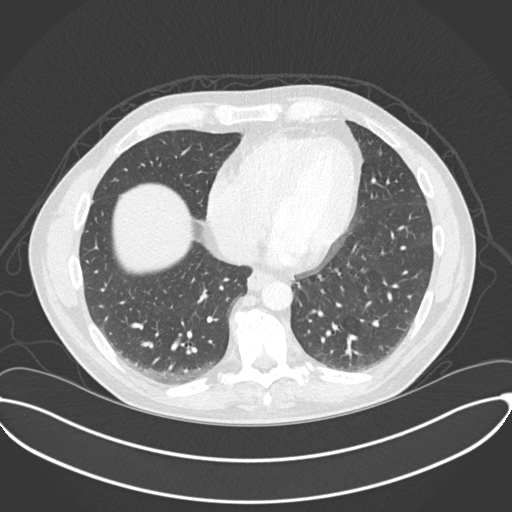
[im 68/158  lung]
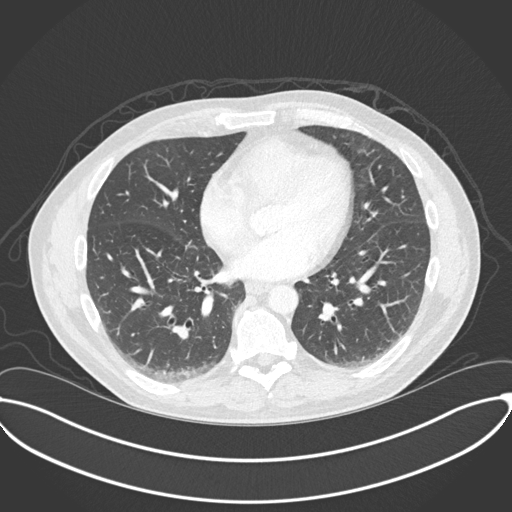
[im 90/158  lung]
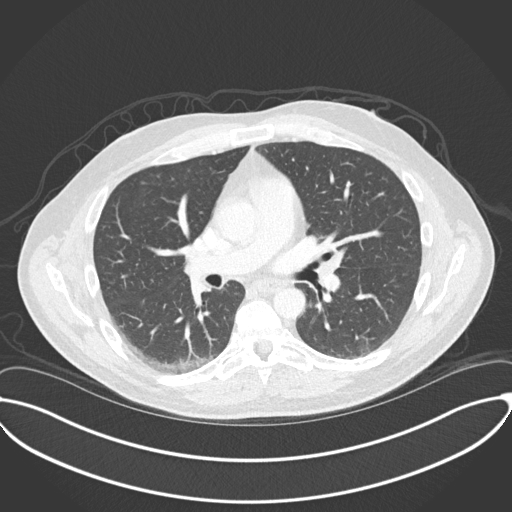
[im 101/158  lung]
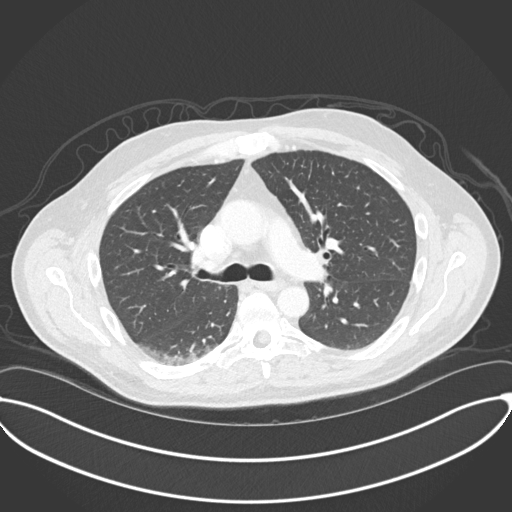
[im 113/158  mediastinal]
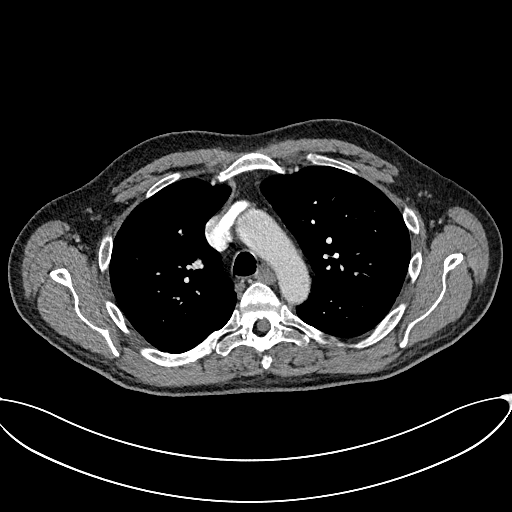
[im 113/158  lung]
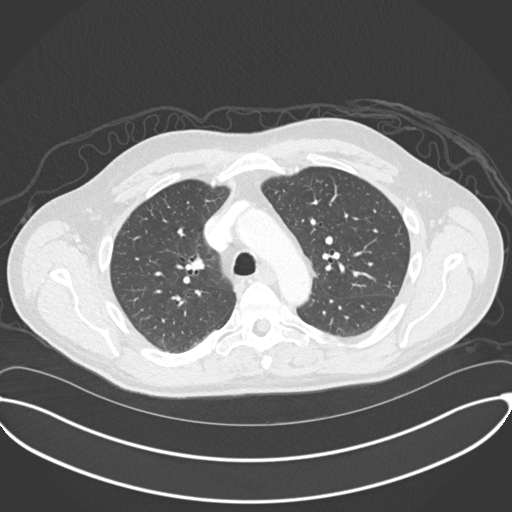
[im 124/158  lung]
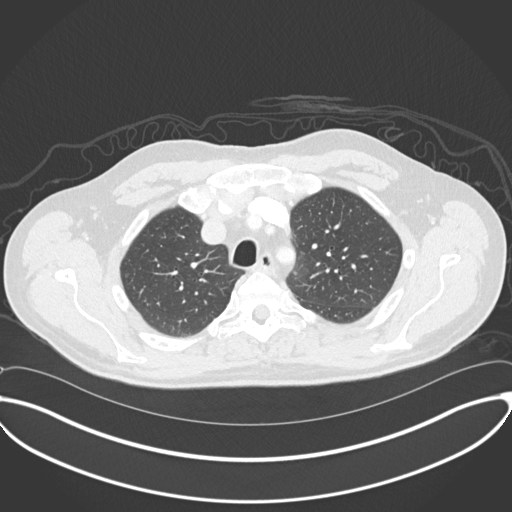
[im 135/158  lung]
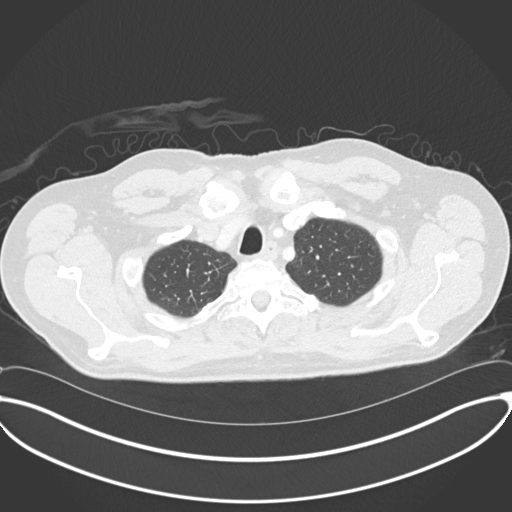
[im 146/158  lung]
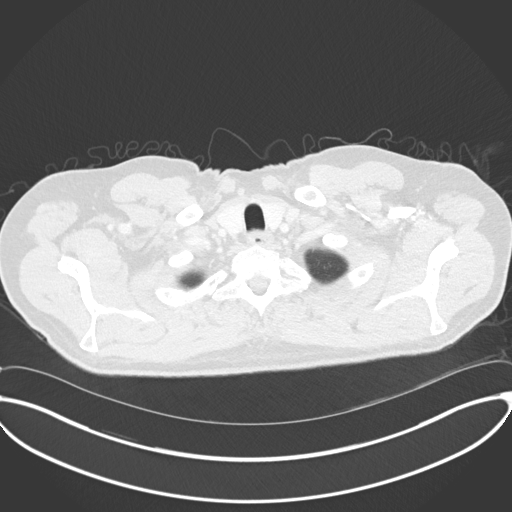

[Series 5: coronal · coronal · 0.65mm/px · 3 of 130 slices shown]
[im 26/130  lung]
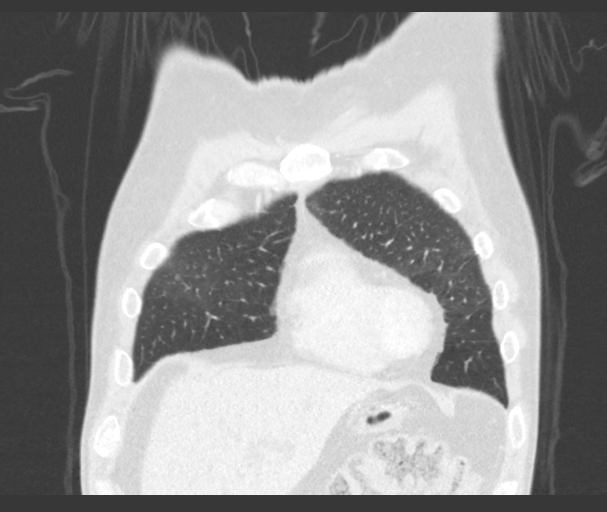
[im 52/130  lung]
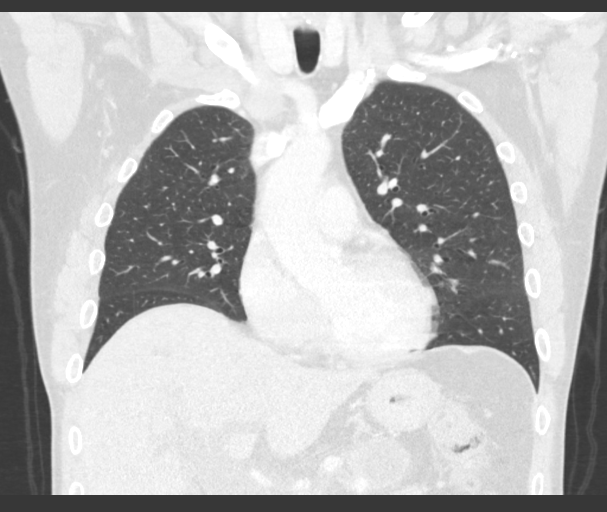
[im 78/130  lung]
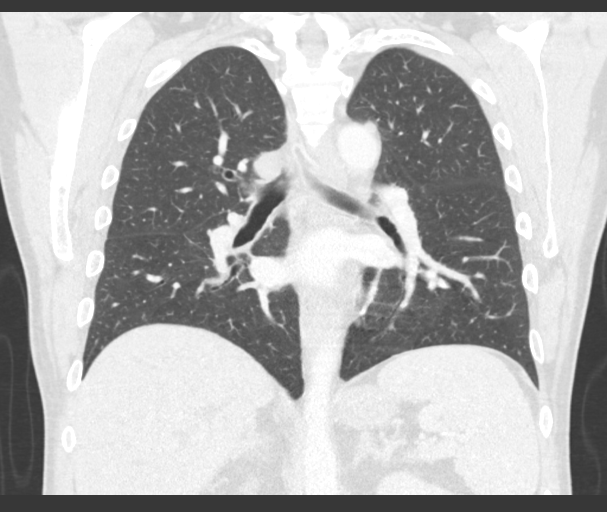

[15 of 36 positions shown; findings below may reference images not displayed]

FINDINGS: Cardiovascular: Coronary artery calcifications. Normal heart size.
No pericardial effusion.

Mediastinum/Nodes: No enlarged mediastinal, hilar, or axillary lymph
nodes. Thyroid gland, trachea, and esophagus demonstrate no
significant findings.

Lungs/Pleura: Bibasilar partial atelectasis. There are occasional
small pulmonary nodules, for example a 3 mm nodule of the anterior
right middle lobe (series 7, image 88). No pleural effusion or
pneumothorax.

Upper Abdomen: Gross biliary ductal dilatation and multiple cystic
appearing lesions of the partially included pancreatic tail.

Musculoskeletal: No chest wall mass or suspicious bone lesions
identified.
IMPRESSION: 1. There are occasional small pulmonary nodules, for example a 3 mm
nodule of the anterior right middle lobe (series 7, image 88). These
are nonspecific and although they likely reflect sequelae of prior
infection or inflammation, early metastatic disease is not excluded.
Attention on follow-up.

2.  Coronary artery disease.

3. Gross biliary ductal dilatation and multiple cystic appearing
lesions of the partially included pancreatic tail. Correlate with
complete imaging of the abdomen.

## 2020-05-04 IMAGING — RF ERCP
1 series · 6 of 6 positions shown · non-contrast
Comparison: None.

CLINICAL DATA: Stent placement

EXAM:
ERCP
TECHNIQUE: Multiple spot images obtained with the fluoroscopic device and
submitted for interpretation post-procedure.
FLUOROSCOPY TIME:  Fluoroscopy Time:  1 minutes and 34 seconds
Radiation Exposure Index (if provided by the fluoroscopic device):
29.18 mGy
Number of Acquired Spot Images: 0

[Series 1: run · 6 of 6 slices shown]
[im 1/6]
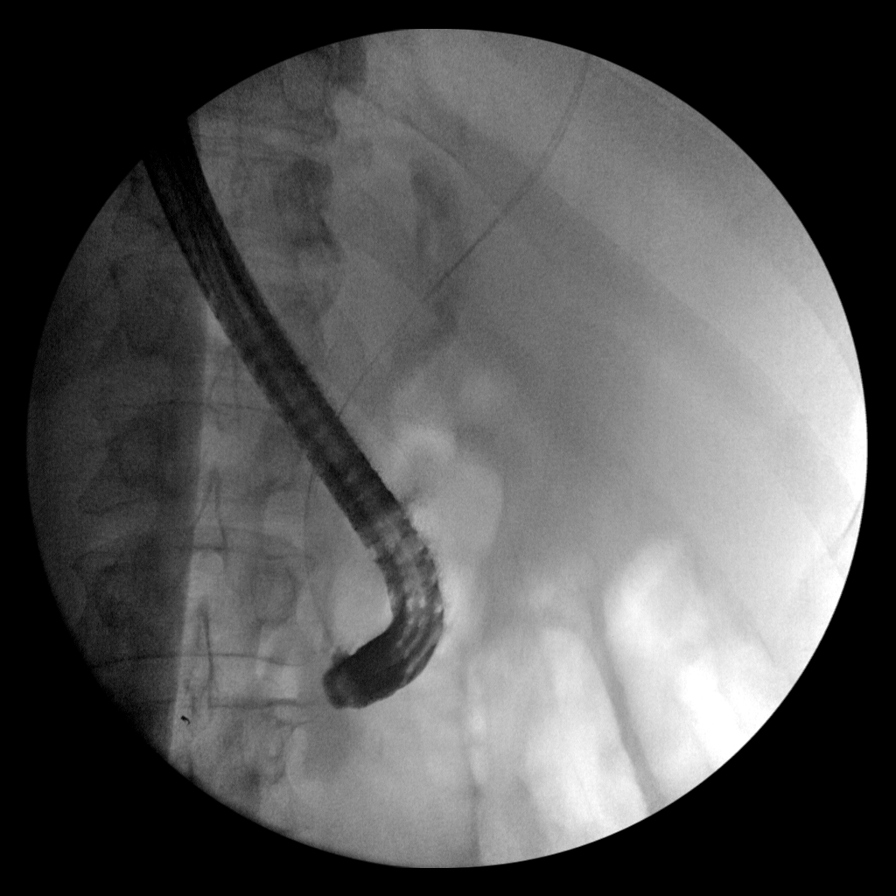
[im 2/6]
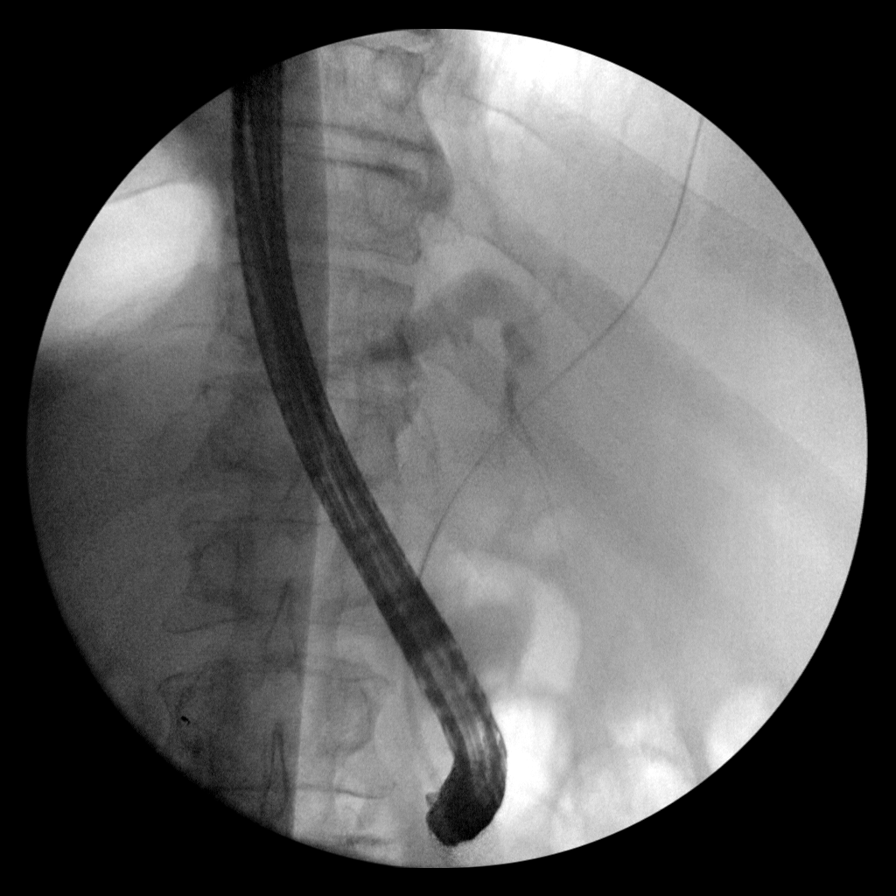
[im 3/6]
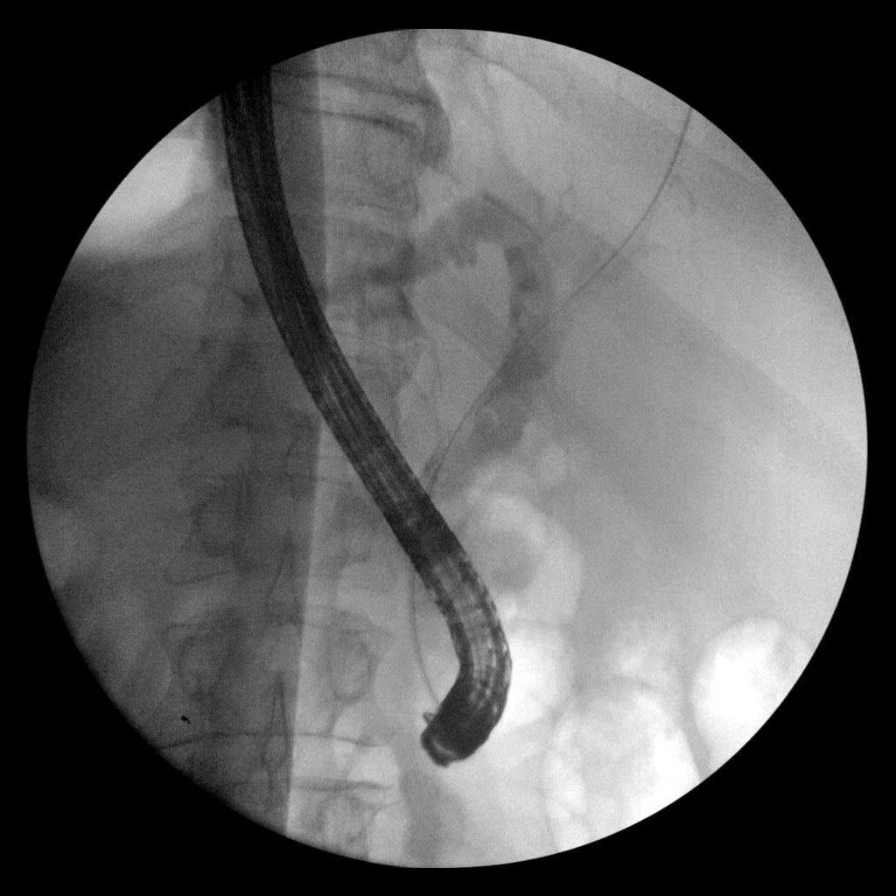
[im 4/6]
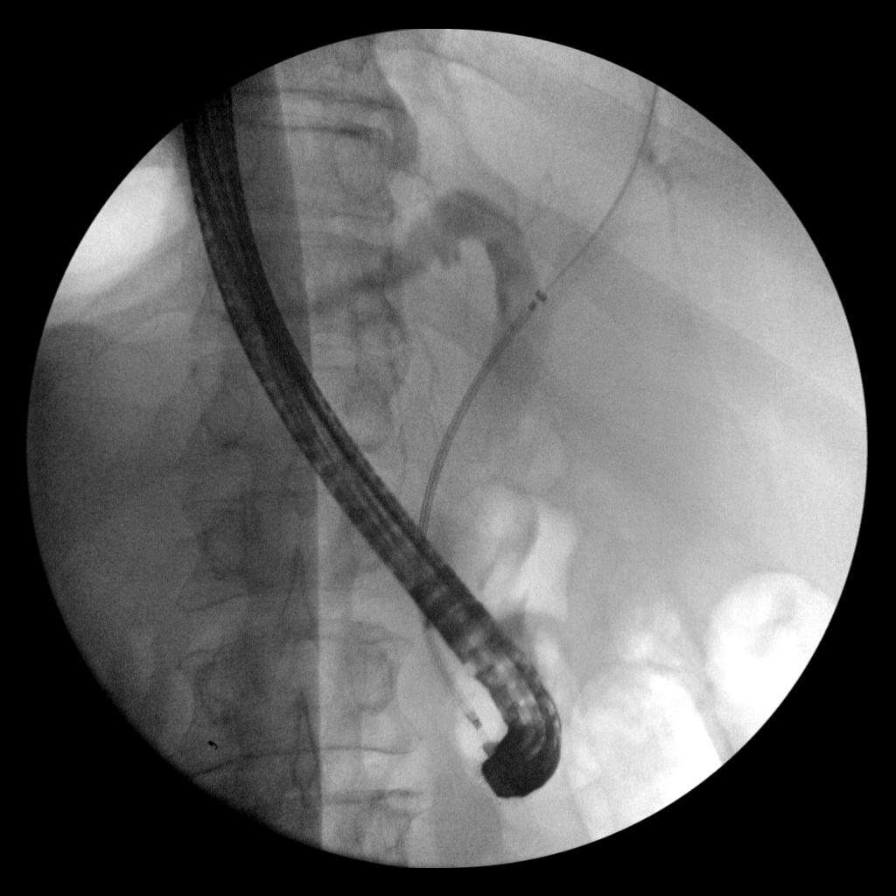
[im 5/6]
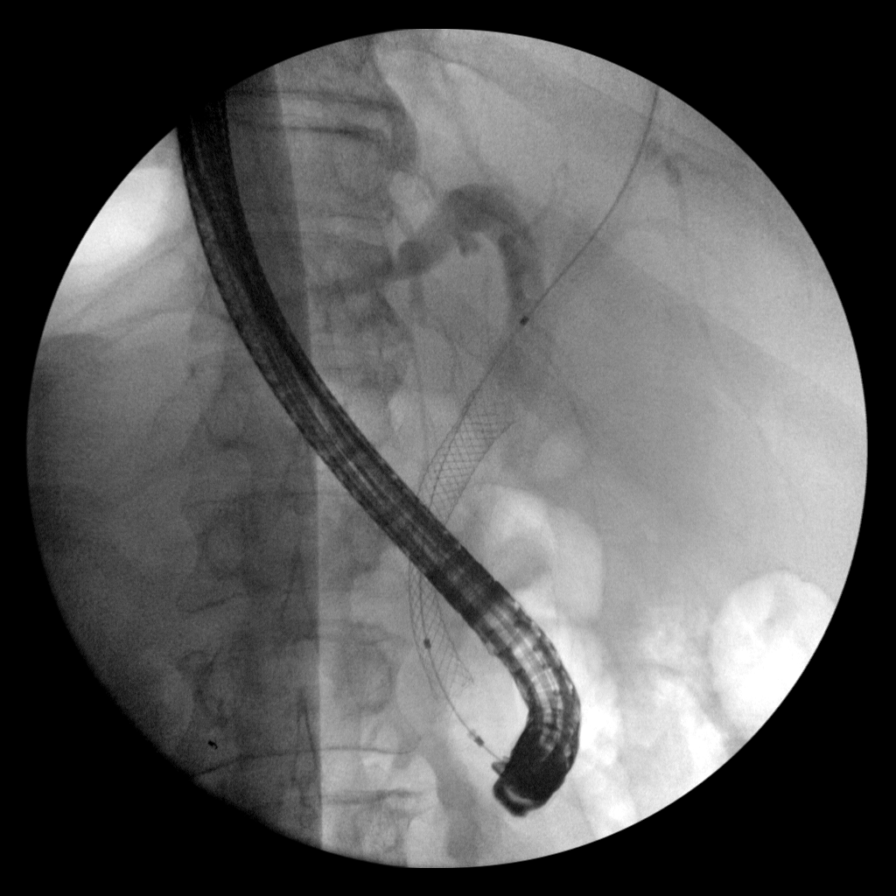
[im 6/6]
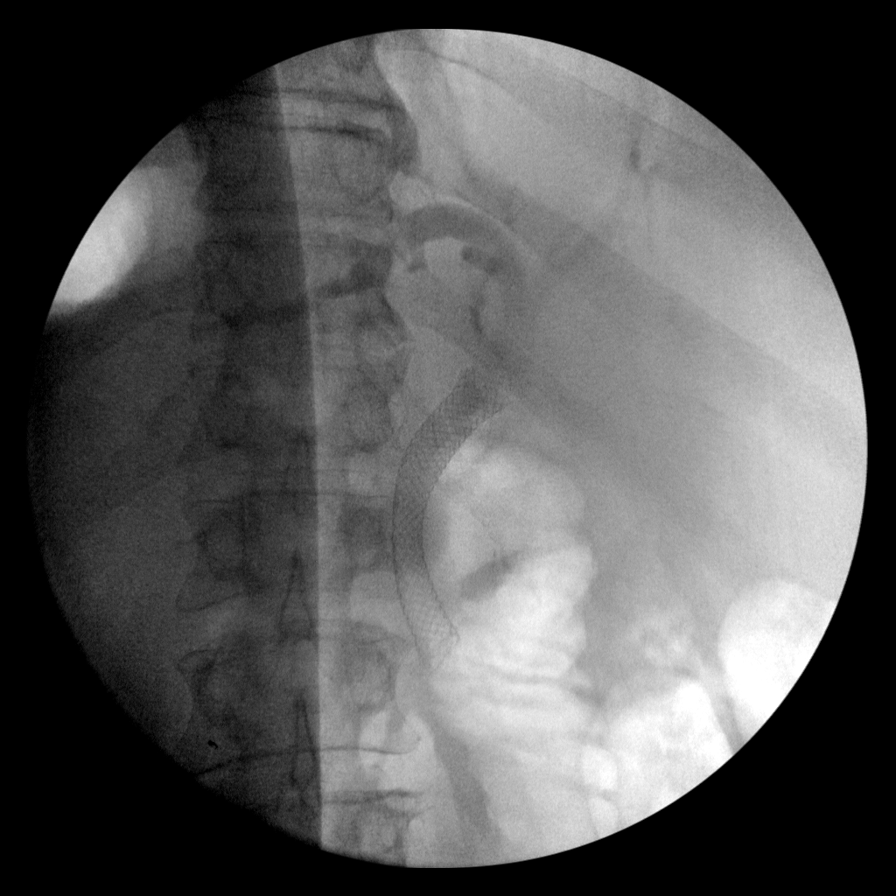

[6 of 6 positions shown; findings below may reference images not displayed]

FINDINGS: Images demonstrate cannulation of the common bile duct. Contrast
fills the biliary tree. Subsequent images demonstrate placement of a
metallic stent across the CBD and ampulla of Vater.
IMPRESSION: Common bile duct metal stent placement.

These images were submitted for radiologic interpretation only.
Please see the procedural report for the amount of contrast and the
fluoroscopy time utilized.

## 2020-05-13 IMAGING — CR PORTABLE CHEST - 1 VIEW
1 series · 1 of 1 positions shown · non-contrast
Comparison: Chest CT, 12/20/2018

CLINICAL DATA: Post Left Port-A-Catheter Insertion for Pancreatic
Cancer.

EXAM:
PORTABLE CHEST 1 VIEW

[chest ap]
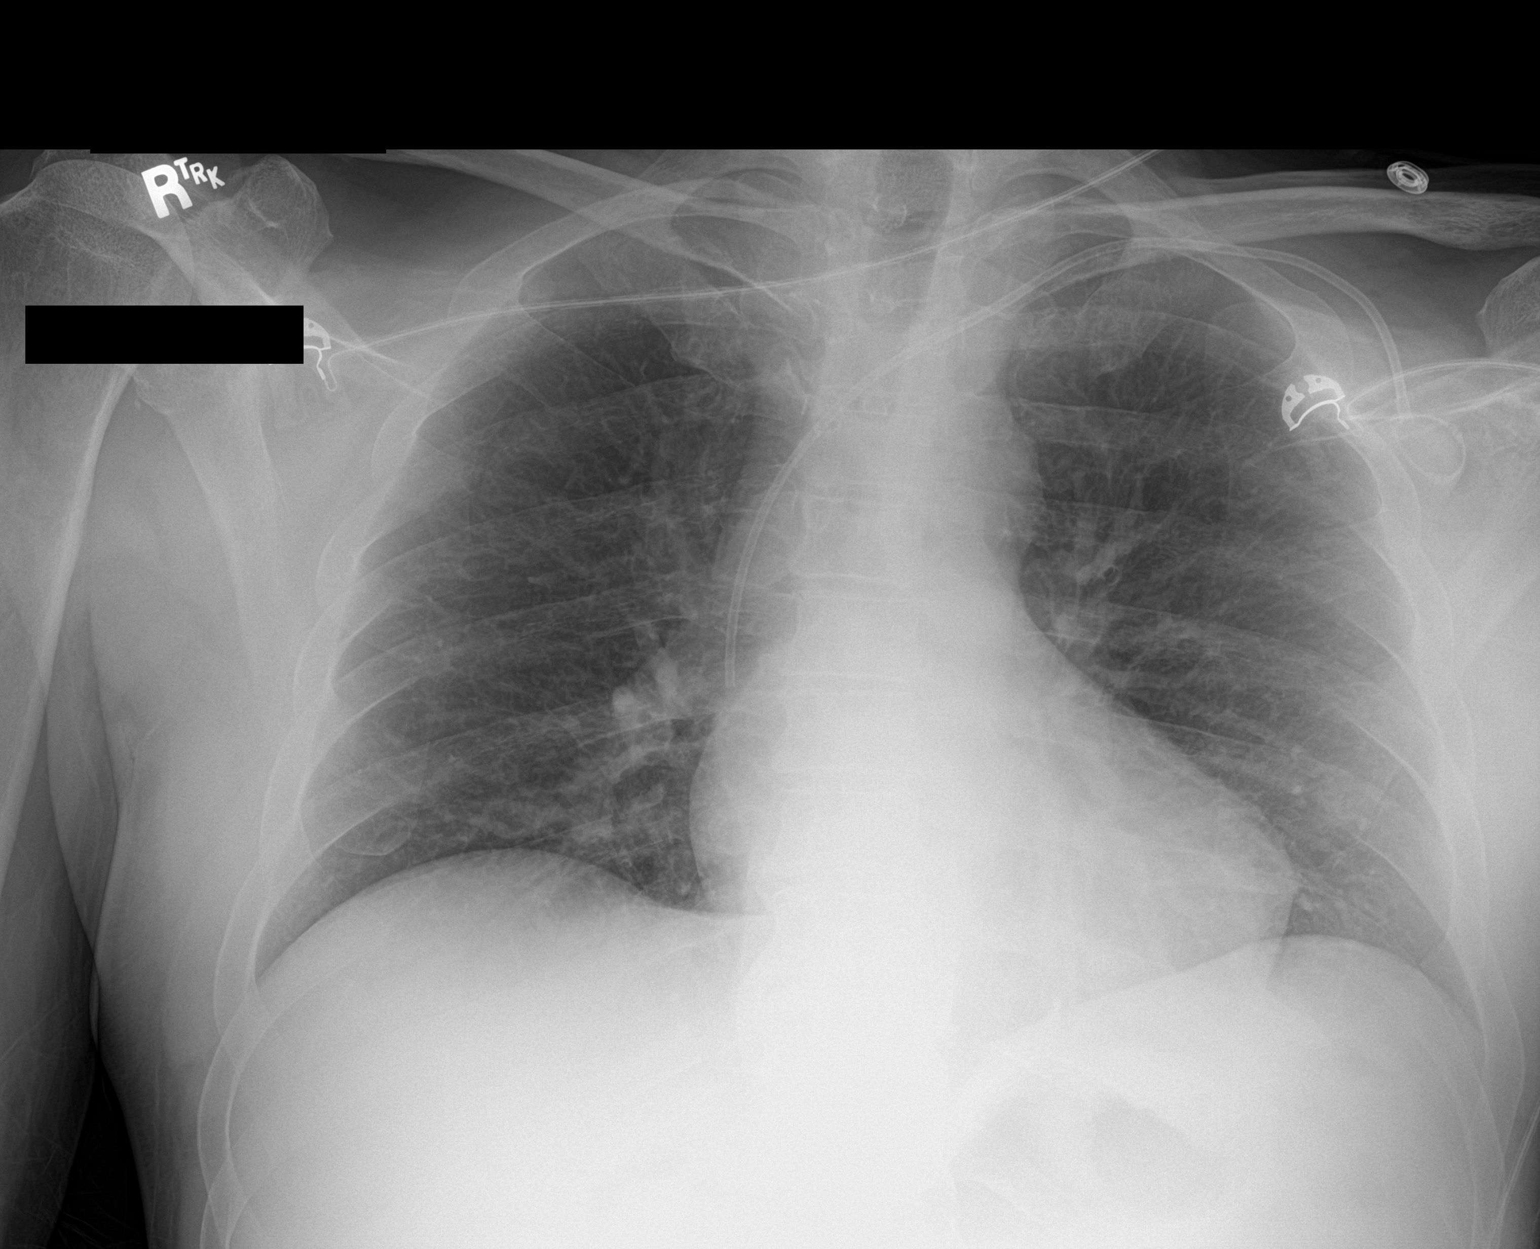

[1 of 1 positions shown; findings below may reference images not displayed]

FINDINGS: Left anterior chest wall Port-A-Cath has been inserted through the
left subclavian vein. Tip projects in the lower superior vena cava.

No pneumothorax.

Cardiac silhouette is normal in size. No mediastinal or hilar masses
or convincing adenopathy. Clear lungs.
IMPRESSION: 1. Well-positioned left anterior chest wall Port-A-Cath, tip
projecting in the lower superior vena cava.
2. No acute cardiopulmonary disease.

## 2020-11-15 IMAGING — DX DG CHEST 1V PORT
1 series · 1 of 1 positions shown · non-contrast
Comparison: Chest CT 08/07/2019

CLINICAL DATA: Syncopal episode.

EXAM:
PORTABLE CHEST 1 VIEW

[chest ap]
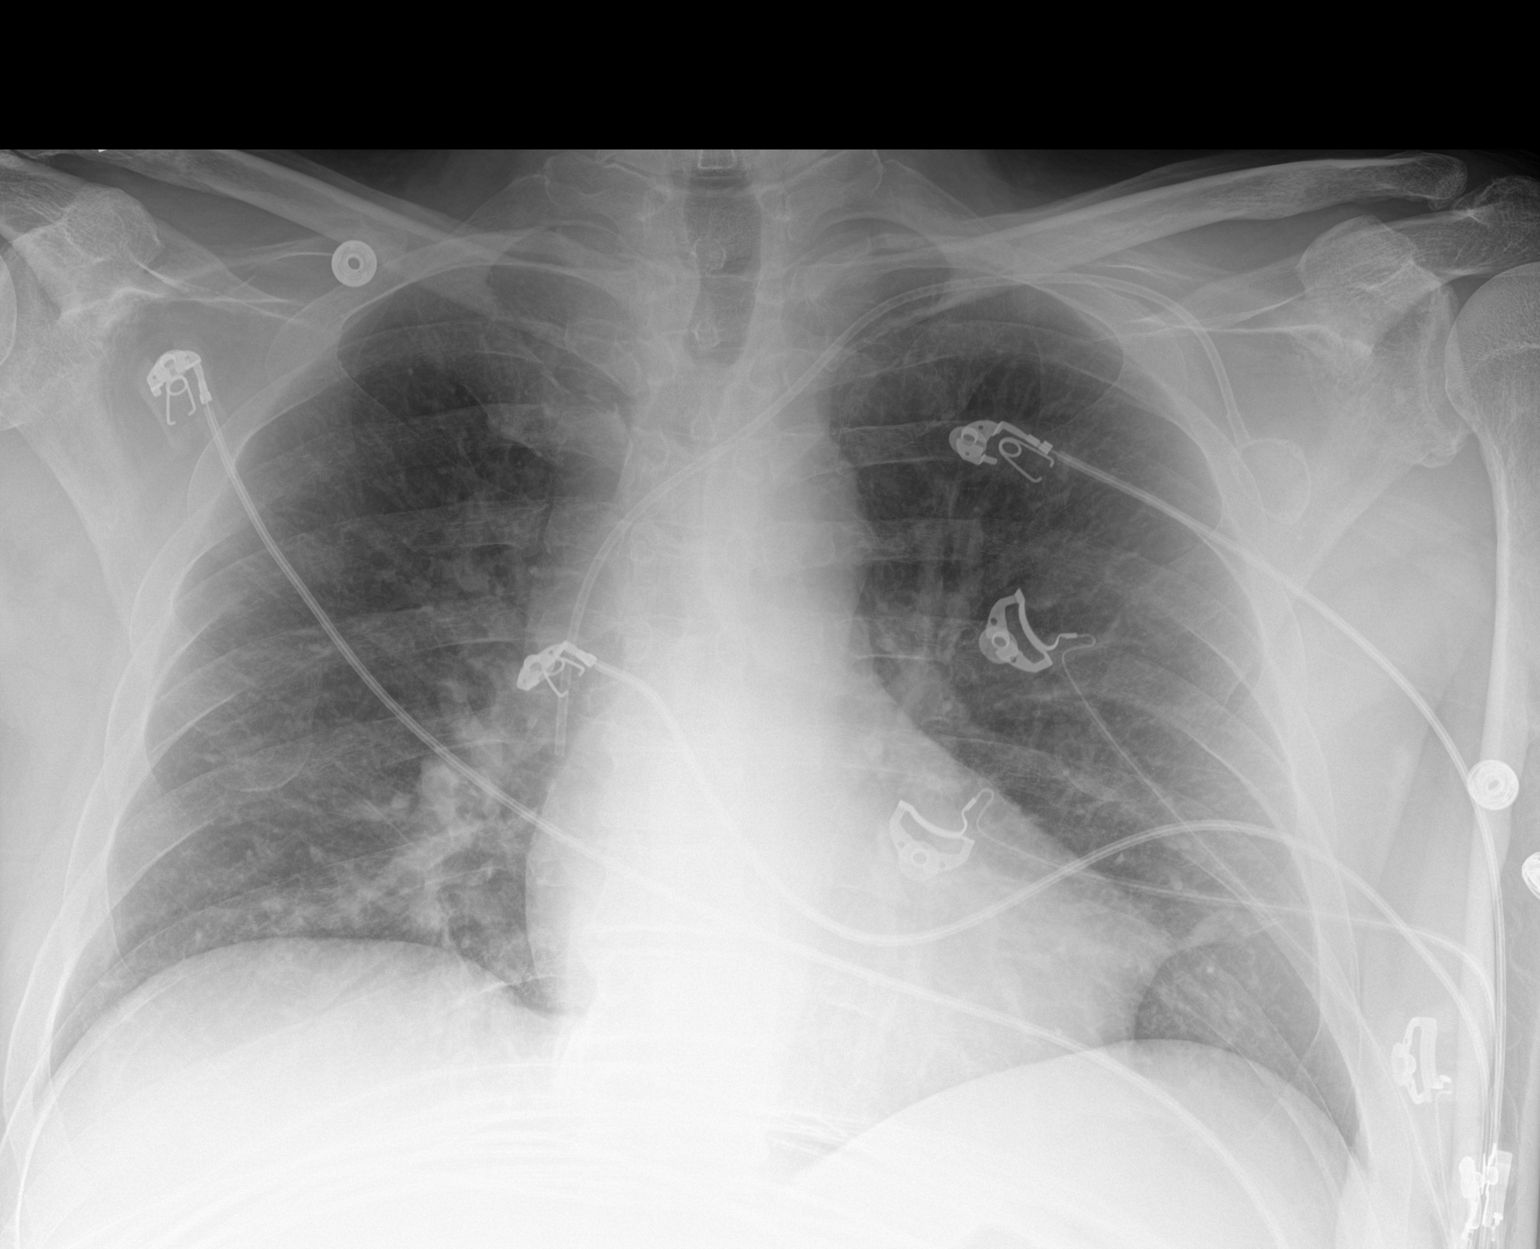

[1 of 1 positions shown; findings below may reference images not displayed]

FINDINGS: The cardiac silhouette, mediastinal and hilar contours are within
normal limits and stable. The lungs are clear. No pleural effusion.
Left subclavian power port in good position, unchanged. The bony
thorax is intact.
IMPRESSION: No acute cardiopulmonary findings.
